# Patient Record
Sex: Female | Born: 1940 | Race: White | Hispanic: No | Marital: Married
Health system: Midwestern US, Academic
[De-identification: ages and names within clinical notes are randomized; demographics above are authoritative.]

## PROBLEM LIST (undated history)

## (undated) LAB — PREPARE RBC, LEUKOREDUCED
Blood Expiration Date: 201908302359
Blood Expiration Date: 201909052359
Blood Expiration Date: 202401152359
Blood Expiration Date: 202401182359
Dispense Status: TRANSFUSED
Dispense Status: TRANSFUSED
Dispense Status: TRANSFUSED
Dispense Status: TRANSFUSED

## (undated) LAB — PREPARE PLATELETS, LEUKOREDUCED
Blood Expiration Date: 202312212359
Dispense Status: TRANSFUSED

## (undated) MED FILL — RUCAPARIB 300 MG TABLET: 300 300 mg | ORAL | 30 days supply | Qty: 120 | Fill #1

## (undated) MED FILL — APIXABAN 5 MG TABLET: 5 5 mg | ORAL | 90 days supply | Qty: 180 | Fill #0

## (undated) MED FILL — SODIUM CHLORIDE 0.65 % NASAL SPRAY AEROSOL: 0.65 0.65 % | NASAL | 30 days supply | Qty: 44 | Fill #0

## (undated) MED FILL — APIXABAN 2.5 MG TABLET: 2.5 2.5 mg | ORAL | 30 days supply | Qty: 60 | Fill #0

## (undated) MED FILL — MAGNESIUM OXIDE 400 MG (241.3 MG MAGNESIUM) TABLET: 400 400 mg | ORAL | 2 days supply | Qty: 5 | Fill #0

## (undated) MED FILL — APIXABAN 2.5 MG TABLET: 2.5 2.5 mg | ORAL | 15 days supply | Qty: 30 | Fill #0

## (undated) MED FILL — ELIQUIS DVT-PE TREATMENT 30-DAY STARTER 5 MG (74 TABLETS) IN DOSE PACK: 5 5 mg (74 tabs) | ORAL | 30 days supply | Qty: 74 | Fill #0

## (undated) MED FILL — RUCAPARIB 300 MG TABLET: 300 300 mg | ORAL | 30 days supply | Qty: 60 | Fill #0

## (undated) MED FILL — RUCAPARIB 300 MG TABLET: 300 300 mg | ORAL | Qty: 120 | Fill #0

## (undated) MED FILL — APIXABAN 5 MG TABLET: 5 5 mg | ORAL | 7 days supply | Qty: 28 | Fill #0

---

## 2018-03-04 IMAGING — PT PET CT SCAN TUMOR IMAGING SKULL TO THIGH
1 of 3 series · 1 of 25 positions shown · non-contrast
Comparison: none

PET CT SCAN TUMOR IMAGING SKULL TO THIGH, 03/04/2018 [DATE]: 
CLINICAL INDICATION:  Recent biopsy of a retroperitoneal mass revealed 
none-small cell malignancy. Prior history of hysterectomy. History of ovarian 
malignancy diagnosed December 2012.
TECHNIQUE: A dose of 13.3 millicuries of 18-FDG was administered intravenously 
and skull to thigh PET scanning was performed at 65 minutes. Tomographic scans 
were reconstructed in axial, coronal, and sagittal projections. The data was 
reconstructed into a three-dimensional volume rendered images and reviewed in 
a 
rotational cine loop. Serum blood glucose at the time of injection was 117 
mg/dl. 
COMPARISON EXAMINATION:  CT abdomen pelvis and CT chest 01/29/2018 from the 
local hospital within the last 12 months.

[(id)_[email or web address]: (id) · axial · 4.0mm · 4.00mm/px · 1 of 213 slices shown]
[im 107/213]
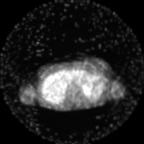

[1 of 25 positions shown; findings below may reference images not displayed]

FINDINGS: On the low-dose dose CT there is an approximately 5.5 cm left retroperitoneal 
mass showing elevated FDG avidity with Max SUV of up to 20. Part of the lesion 
is photopenic which may indicate central necrosis. There is also a small FDG 
avid lymph node just anterior to the abdominal aorta. There is an FDG avid 
lesion over the dome of the liver, most likely a pericapsular peritoneal 
metastasis showing max SUV of 6.5. No other pathological FDG activity is found 
in the abdomen or pelvis. Physiologic FDG avidity in the urinary tract is 
present there is activity in the GI tract which is felt to be physiologic. No 
abnormal areas of FDG avidity are found in the chest or neck. Otherwise 
unremarkable.
IMPRESSION: There is FDG avid retroperitoneal lymphadenopathy as described above and an 
FDG 
avid lesion over the dome of the liver most likely a peritoneal implant rather 
than a liver metastasis. No other focal areas of FDG avid malignancy are 
found. 
Specifically no lymph node activity is found outside of the retroperitoneum 
and 
there is no evidence for FDG avid pulmonary, skeletal or hepatic parenchymal 
metastasis.

## 2018-03-11 NOTE — Progress Notes (Signed)
Tried to review care everywhere but could not access records.

## 2018-03-19 NOTE — Telephone Encounter (Signed)
Left vm for the patient to do a pre clinic phone screen.

## 2018-03-22 NOTE — Telephone Encounter (Signed)
Patient returned a call from [facility], Friday 4/12.

## 2018-03-22 NOTE — Telephone Encounter (Signed)
Completed pre clinic phone screen

## 2018-04-01 ENCOUNTER — Ambulatory Visit: Admit: 2018-04-01 | Discharge: 2018-04-01 | Payer: Medicare (Managed Care)

## 2018-04-01 DIAGNOSIS — C561 Malignant neoplasm of right ovary: Secondary | ICD-10-CM

## 2018-04-01 MED ORDER — proCHLORPERazine (COMPAZINE) 10 MG tablet
10 | ORAL_TABLET | Freq: Four times a day (QID) | ORAL | 3 refills | Status: AC | PRN
Start: 2018-04-01 — End: 2020-08-16

## 2018-04-01 MED ORDER — dexamethasone (DECADRON) 4 MG tablet
4 | ORAL_TABLET | ORAL | 6 refills | 10.00000 days | Status: AC
Start: 2018-04-01 — End: 2018-11-18

## 2018-04-01 NOTE — Progress Notes (Signed)
History of Present Illness  Kelsey Sullivan is a 77 y.o. 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    Ms. 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 She declined adjuvant therapy. She used naturla methods, went to a raw diet for a year, etc.  She developed a small bowel obstruction in 03/2017, resolved on its own, with NED at that time.  In 01/2018, she experienced mid abdominal pain, and seen by PCP and ED, with CT of abdomen and pelvis showing interval development of necrotic retroperitoneal lymphadenopathy with largest node measuring 4.9  4.2 x 4.8 cm. Her CA 125 on 02/18/18 was 197.8.  She underwent a ct-guided nodal biopsy on 02/26/18, with pathology confirming recurrent disease.  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 She was seen at [facility] in [place] with Dr. Ishmael Holter and was recommended to undergo chemotherapy with Carboplatin and Paclitaxel-weekly.  Foundation 1 testing was also sent.  She lives in [place] half of the time, and [place] half of the time and has decided to pursue therapy in [place] given more family support.     Kelsey Sullivan is doing well.   She reports continued tenderness to her mid abdomen, intermittent.  Reports a 10 lb weight loss in 01/2018 but now eating  better.  She denies any bloating or fullness.  She reports normal bowel/bladder habits.  She denies any GU concerns. Denies any dysuria, frequency, hematuria, flank pain or fevers.  She denies any vaginal bleeding or discharge.   She denies neuropathy.   She reports no leg swelling.   Her appetite is good, and reports no nausea or vomiting.     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 Pt declined adjuvant therapy      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     Posterior cul de sac biopsy-metastatic adenocarcinoma.     Omentectomy- metastatic adenocarcinoma     LND-negative     Residual right IP ligament, Anterior cul de sac biopsy, right pelvic side will  biopsy, left pelvic side wall, right gutter and left gutter-->benign    04/06/2017:   CT [name]- mild small bowel obstruction with transition point in the left pelvis adjacent to the anterior abdominal wall. Low density in the common femoral veins bilaterally thought likely to represent flow artificat, may consider follow up with lomer extremity Doppler to ensure there is no underlying deep venous thrombosis.    01/29/2018:   CTAP:  Interval development of necrotic retroperitoneal lymphadenopathy with largest node measuring 4.9  4.2 x 4.8 cm. Origin is indeterminate.  Options would include CT directed biopsy as well as PET/CT.    02/18/2018:   CA  125:  197.8    02/26/2018:   CT guided Left retroperitoneal node biopsy- focally involved by non-small cell malignancy- additional stains are diagnostic of non-small cell carcionoma and supportive of involve63ment by the patients reported previous known ovarian malignancy.    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  No other focal areas of FD avid malignancy are found. Specifically, no lymph node activity is found outside of the retroperitoneum and there is no evidence for FDG avid pulmonary, skeletal or hepatic parenchymal metastasis.    02/2018:   Patient saw Dr. Ishmael Holter who recommended Carbo/Taxol with an AUC of 6.  Dr. Maren Reamer office submitted foundation one testing. Patient would like Genetic Testing    Disposition: chemo, imaging after 3 cucles, CA 125, possible parp maintenance after, genetic testing   Current disease status:   Genetics:  Survivorship plan: pending completion of treatment              Past Medical History  She  has a past medical history of Bowel obstruction (CMS Dx) (2018) and Ovarian cancer (CMS Dx) (2014).  Past Medical History:   Diagnosis Date    Bowel obstruction (CMS Dx) 2018    Ovarian cancer (CMS Dx) 2014       Past Surgical History  Past Surgical History:   Procedure Laterality Date    ABDOMINOPLASTY  1999    BLEPHAROPLASTY  2000    DILATION AND CURETTAGE OF UTERUS  1979    ELBOW SURGERY  1990    EYE SURGERY  1994    KNEE ARTHROPLASTY Right     ROTATOR CUFF REPAIR      TRANSUMBILICAL AUGMENTATION MAMMAPLASTY      VAGINAL HYSTERECTOMY  1979       Family History  History reviewed. No pertinent family history.    Family cancer history for ovarian, uterine and colon cancer is negative other than above.       Social History  Social History     Social History    Marital status: Divorced     Spouse name: N/A    Number of  children: N/A    Years of education: N/A     Social History Main Topics    Smoking status: Former Smoker    Smokeless tobacco: Never Used    Alcohol use Yes      Comment: 2-3 drinks a day    Drug use: No    Sexual activity: Not Asked     Other Topics Concern    Caffeine Use No    Occupational Exposure No    Exercise Yes    Seat Belt Yes     Social History Narrative  Mammogram-6 years ago and it was normal. Pt stated she isn't getting them anymore.      Colonoscopy-10 years ago        Pt reports she can lie flat in bed without SOB.    Pt states she can walk a flight of stairs and city block without chest pain and SOB>         Past OB/GYN History  G3P2  She reports menarche at age 42 and menopause at age 64-surgical.  She denies a history of STIs.  She denies a history of abnormal cervical cytology and reports her last cytologic examination she is unsure of. She has taken HRT.     Health maintenance:  Mammogram: Date 6 years ago Results normal  Colonoscopy: Date 10 years ago Results normal    Allergies  She is allergic to bactrim [sulfamethoxazole-trimethoprim] and nsaids (non-steroidal anti-inflammatory drug).    BMI  Body mass index is 19.57 kg/m.               Histories  She has a past medical history of Bowel obstruction (CMS Dx) (2018) and Ovarian cancer (CMS Dx) (2014).    She has a past surgical history that includes Dilation and curettage of uterus (1979); Vaginal hysterectomy (1979); Elbow surgery (1990); Eye surgery (1994); Abdominoplasty (1999); Blepharoplasty (2000); Transumbilical augmentation mammaplasty; Rotator cuff repair; and Knee Arthroplasty (Right).    Her family history is not on file.    She reports that she has quit smoking. She has never used smokeless tobacco. She reports that she drinks alcohol. She reports that she does not use drugs.    Allergies  Bactrim [sulfamethoxazole-trimethoprim] and Nsaids (non-steroidal anti-inflammatory drug)    Medications  Outpatient Encounter  Prescriptions as of 04/01/2018   Medication Sig Dispense Refill    ergocalciferol, vitamin D2, (VITAMIN D ORAL) Take by mouth.      omeprazole (PRILOSEC) 20 MG capsule       vit b complex w-b 12 (B COMPLEX-VITAMIN B12) tablet Take 1 tablet by mouth daily.      dexamethasone (DECADRON) 4 MG tablet Take 8 mg by mouth daily with food on day 2, 3, and 4 of chemotherapy cycle 18 tablet 6    proCHLORPERazine (COMPAZINE) 10 MG tablet Take 1 tablet (10 mg total) by mouth every 6 hours as needed (For nausea and vomiting). 30 tablet 3     No facility-administered encounter medications on file as of 04/01/2018.         Review of Systems   Constitutional: Positive for weight loss. Negative for activity change, appetite change, chills, fatigue, fever and weight gain.   HENT: Negative for congestion, mouth sores, rhinorrhea, sinus pressure, sore throat and trouble swallowing.    Eyes: Negative for photophobia, redness, itching and visual disturbance.   Respiratory: Negative for apnea, cough, chest tightness and shortness of breath.    Cardiovascular: Negative for chest pain, palpitations and leg swelling.   Gastrointestinal: Positive for abdominal pain. Negative for abdominal distention, anal bleeding, bloating, blood in stool, constipation, diarrhea, nausea and vomiting.   Genitourinary: Negative for decreased urine volume, difficulty urinating, dysuria, frequency, genital sores, hematuria, pelvic pain, urgency, vaginal bleeding, vaginal discharge and vaginal pain.   Musculoskeletal: Negative for arthralgias, back pain, joint swelling and neck pain.   Skin: Negative for color change, pallor and wound.   Neurological: Negative for dizziness, seizures, weakness, numbness and headaches.   Hematological: Negative for adenopathy. Does not bruise/bleed easily.   Psychiatric/Behavioral: Negative for  agitation, confusion and depression. The patient is not nervous/anxious.        Vitals  Blood pressure 153/75, pulse 77, temperature  97.4 F (36.3 C), temperature source Temporal, resp. rate 18, height 5' 4 (1.626 m), weight 114 lb (51.7 kg), SpO2 100 %.    Physical Exam   Constitutional: She is oriented to person, place, and time. She appears well-developed and well-nourished.   HENT:   Head: Normocephalic and atraumatic.   Eyes: Conjunctivae and EOM are normal.   Neck: Normal range of motion. Neck supple. No thyromegaly present.   Cardiovascular: Normal rate, regular rhythm and normal heart sounds.    Pulmonary/Chest: Effort normal and breath sounds normal.   Abdominal: Soft. Bowel sounds are normal. She exhibits no distension and no mass. There is tenderness. There is no rebound and no guarding.   No masses or tenderness.  No hernia palpable.   No hepatosplenomegaly.     Prior scars from xlap, tummy tuck   Genitourinary:   Genitourinary Comments: Normal external genitalia/vulva.  Normal urethral meatus, urethra, bladder, anus and perineum.  Normal vagina and vaginal cuff.  Uterus, cervix and adnexa surgically absent.  Normal rectum.  There is no nodularity in the posterior culdesac or rectovaginal septum.   Musculoskeletal: Normal range of motion. She exhibits no edema.   Lymphadenopathy:     She has no cervical adenopathy.   Neurological: She is alert and oriented to person, place, and time.   Skin: Skin is warm and dry.   Psychiatric: She has a normal mood and affect. Her behavior is normal. Judgment and thought content normal.          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       L ovary and fallopian tube- serous adenocarcinoma,  high grade, 4 cm, favor metastasis.     Posterior cul de sac biopsy-metastatic adenocarcinoma.     Omentectomy- metastatic adenocarcinoma     LND-negative     Residual right IP ligament, Anterior cul de sac biopsy, right pelvic side will biopsy, left pelvic side wall, right gutter and left gutter-->benign    04/06/2017:   CT [name]- mild small bowel obstruction with transition point in the left pelvis adjacent to the anterior abdominal wall. Low density in the common femoral veins bilaterally thought likely to represent flow artificat, may consider follow up with lomer extremity Doppler to ensure there is no underlying deep venous thrombosis.    01/29/2018:   CTAP:  Interval development of necrotic retroperitoneal lymphadenopathy with largest node measuring 4.9  4.2 x 4.8 cm. Origin is indeterminate.  Options would include CT directed biopsy as well as PET/CT.    02/18/2018:   CA 125:  197.8    02/26/2018:   CT guided Left retroperitoneal node biopsy- focally involved by non-small cell malignancy- additional stains are diagnostic of non-small cell carcionoma and supportive of involve67ment by the patients reported previous known ovarian malignancy.    03/04/2018:   PET/CT-  FDG avid retroperitoneal lymphadenopathy as described above and an FDG avid lesion  over the dome of the liver most likely a peritoneal implant rather than a liver metastasis.  No other focal areas of FD avid malignancy are found. Specifically, no lymph node activity is found outside of the retroperitoneum and there is no evidence for FDG avid pulmonary, skeletal or hepatic parenchymal metastasis.    Cancer Staging:  Cancer Staging  No matching staging information was found for the patient.                Assessment & Plan  My impression is Ms. 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    We discussed recurrent ovarian cancer in detail and agree with the recommendation of Dr. Ishmael Holter for treatment platinum-based chemotherapy with Carboplatin and Paclitaxel.  The patient is interested in dose-dense Paclitaxel and we will proceed in this manner.      The risk and potential benefits of chemotherapy as well as available alternatives have been reviewed.  The patient has verbalized clear understanding of the cancer diagnosis and the reasons for the recommended therapy.  We discussed the risks and side effects of treatment, including allergic reaction, anemia, thrombocytopenia, neutropenia, neutropenic sepsis, kidney failure, peripheral neuropathy, and even death related to chemotherapy complications.  The patient's recent clinical and laboratory data have been reviewed and the treatment plan formulated.  Patient verbalized understanding of the plan and associated risks.    We will plan to begin treatment in the next couple weeks, and will plan for port placement prior. We will also plan to send genetic testing at her next visit.  All of her questions were answered and we will see her back in 2 weeks to begin treatment.  __IR referral for port placement  __Genetic testing to be sent at next visit  __C1 planned for 5/9  __OSH CTs from 01/29/18 and pet from 03/04/18 to be uploaded- give CDs back to pt   __needs chemo consent next visit    Pain  Pain Score: Zero (04/01/2018 10:53 AM)     She denies presence of pain.  Pain will be reassessed at next visit.           Hedda Slade, CNP  Division of Gynecologic Oncology  [phone]    I saw and personally examined the patient today with my CNP [place]. I discussed the findings and therapeutic plan with the patient. I repeated, reviewed and agree with the history of present illness, past medical histories, family history, social history, medication list, and allergies as listed. The review of  systems is as noted above. My physical exam confirms the findings listed above. Review of labs, pathology reports, radiograph reports, and medical records confirm the findings noted above. I agree with the assessment and plan as noted above. I have edited the note where appropriate.     I have personally performed a face to face diagnostic evaluation on this patient, seen initially by the CNP. I have personally developed the care plan as outlined in the Assessment and Plan.      Complexity: high     I spent a total of 45 minutes face-to-face of which >50% was spent in counseling and/or coordination of care, documentation and counseling.with patient and/or family.      Topics discussed include recurrent plat sensitive  Cancer prognosis and treatment options and chemo counseling.      Maggie Font, MD  Gynecology Oncology  [phone]      Medical Decision Making:  The following items were considered in medical decision making:  Obtain records and history from outside facility/provider  Review / order clinical lab tests  Review / order radiology tests  Review / order other diagnostic tests/interventions  Reviewed outside records

## 2018-04-02 MED ORDER — lidocaine-prilocaine (EMLA) cream
2.5-2.5 | Freq: Every day | TOPICAL | 1 refills | Status: AC | PRN
Start: 2018-04-02 — End: 2020-08-16

## 2018-04-02 NOTE — Telephone Encounter (Signed)
Patient called about a topical anesthetic gel that is used for ports.

## 2018-04-02 NOTE — Telephone Encounter (Signed)
Rx sent to pharmacy for Emla

## 2018-04-06 ENCOUNTER — Inpatient Hospital Stay: Admit: 2018-04-06

## 2018-04-09 NOTE — Unmapped (Signed)
IR PRE-PROCEDURE PHONE CALL NOTE   Date: 04/09/2018  MRN: [id number]  Patient: Kelsey Sullivan  Age: 77 y.o.    Spoke to [place] regarding preparation for upcoming IR port placement procedure at 0930 am on 04/12/2018 with an arrival time of 0900 am. Prep Instructions to hold none as approved by n/a, NPO after midnight, and need for a driver reviewed. Carroll Kinds Eguia verbalized understanding and teach back of instructions.    Interperter needed: no    Wt Readings from Last 1 Encounters:   04/01/18 114 lb (51.7 kg)      Ht Readings from Last 1 Encounters:   04/01/18 5' 4 (1.626 m)     Allergies Verified? yes  Allergies   Allergen Reactions   ??? Bactrim [Sulfamethoxazole-Trimethoprim]      Fear of going crazy   ??? Nsaids (Non-Steroidal Anti-Inflammatory Drug)      Stomach ache     History:   Primary Care Physician: Salina April, MD    Cardiovascular Disease: NO  Pulmonary Disease: NO  Renal Disease: NO  Liver Disease: NO  Other Disease: Bowel obstruction, Ovarian cancer   Heartburn: NO  Fainting: NO  Stroke: NO  Seizure: NO  Diabetes: NO  Bleeding Problems: NO  Blood Clots: NO  Smoker: former,quit smoking. She has never used smokeless tobacco  >3 Drinks per day: 2-3 drinks a day   Pregnant: no  Can you lay flat: yes  Have you or a family member ever had problems with anesthesia/sedation: no    Past Medical History:   Diagnosis Date   ??? Bowel obstruction (CMS Dx) 2018   ??? Ovarian cancer (CMS Dx) 2014     Past Surgical History:   Procedure Laterality Date   ??? ABDOMINOPLASTY  1999   ??? BLEPHAROPLASTY  2000   ??? DILATION AND CURETTAGE OF UTERUS  1979   ??? ELBOW SURGERY  1990   ??? EYE SURGERY  1994   ??? KNEE ARTHROPLASTY Right    ??? ROTATOR CUFF REPAIR     ??? TRANSUMBILICAL AUGMENTATION MAMMAPLASTY     ??? VAGINAL HYSTERECTOMY  1979       Medications:  Current Outpatient Prescriptions on File Prior to Visit   Medication Sig Dispense Refill   ??? dexamethasone (DECADRON) 4 MG tablet Take 8 mg by mouth daily with food on day 2, 3, and  4 of chemotherapy cycle 18 tablet 6   ??? ergocalciferol, vitamin D2, (VITAMIN D ORAL) Take by mouth.     ??? lidocaine-prilocaine (EMLA) cream Apply topically daily as needed. Apply pea-size amount to port site 30-60 min prior to accessing port 30 g 1   ??? omeprazole (PRILOSEC) 20 MG capsule      ??? proCHLORPERazine (COMPAZINE) 10 MG tablet Take 1 tablet (10 mg total) by mouth every 6 hours as needed (For nausea and vomiting). 30 tablet 3   ??? vit b complex w-b 12 (B COMPLEX-VITAMIN B12) tablet Take 1 tablet by mouth daily.       No current facility-administered medications on file prior to visit.      Anticoagulation/Anti-platelet: NONE  Planned Sedation: local, moderate sedation  Hydration needed: N/A  Possible Pre-meds: N/A  Possible labs needed: N/A  Other Imaging needed: N/A  Transportation needed: no  Who will give you a ride and take care of you after discharge? friend    Cecille Po  Interventional Radiology [phone]  04/09/2018, 11:35 AM

## 2018-04-12 ENCOUNTER — Inpatient Hospital Stay: Admit: 2018-04-12 | Discharge: 2018-04-16 | Payer: Medicare (Managed Care) | Attending: Diagnostic Radiology

## 2018-04-12 DIAGNOSIS — C5700 Malignant neoplasm of unspecified fallopian tube: Secondary | ICD-10-CM

## 2018-04-12 LAB — PROTIME-INR
INR: 1 (ref 0.9–1.1)
Protime: 13.4 seconds (ref 12.1–15.1)

## 2018-04-12 MED ORDER — fentaNYL (SUBLIMAZE) injection
50 | INTRAMUSCULAR | Status: AC | PRN
Start: 2018-04-12 — End: 2018-04-12
  Administered 2018-04-12 (×2): 50 via INTRAVENOUS

## 2018-04-12 MED ORDER — ceFAZolin (ANCEF) injection
1 | INTRAMUSCULAR | Status: AC
Start: 2018-04-12 — End: 2018-04-12
  Administered 2018-04-12: 14:00:00 1

## 2018-04-12 MED ORDER — midazolam (PF) (VERSED) 5 mg/mL injection
5 | INTRAMUSCULAR | Status: AC
Start: 2018-04-12 — End: 2018-04-12

## 2018-04-12 MED ORDER — fentaNYL (SUBLIMAZE) 50 mcg/mL injection
50 | INTRAMUSCULAR | Status: AC
Start: 2018-04-12 — End: 2018-04-12

## 2018-04-12 MED ORDER — naloxone (NARCAN) 0.4 mg/mL injection
0.4 | INTRAMUSCULAR | Status: AC
Start: 2018-04-12 — End: 2018-04-12

## 2018-04-12 MED ORDER — flumazenil (ROMAZICON) 0.1 mg/mL injection
0.1 | INTRAVENOUS | Status: AC
Start: 2018-04-12 — End: 2018-04-12

## 2018-04-12 MED ORDER — sodium chloride 0.9 % infusion
INTRAVENOUS | Status: AC | PRN
Start: 2018-04-12 — End: 2018-04-12
  Administered 2018-04-12: 14:00:00 250 via INTRAVENOUS

## 2018-04-12 MED ORDER — ceFAZolin (ANCEF) injection
1 | INTRAMUSCULAR | Status: AC
Start: 2018-04-12 — End: 2018-04-12
  Administered 2018-04-12: 14:00:00 1 via INTRAVENOUS

## 2018-04-12 MED ORDER — ceFAZolin (ANCEF) 1 g in sodium chloride 0.9% 100 mL ADDaptor IVPB
Freq: Three times a day (TID) | INTRAVENOUS | Status: AC
Start: 2018-04-12 — End: 2018-04-13

## 2018-04-12 MED ORDER — midazolam (PF) (VERSED) injection
5 | INTRAMUSCULAR | Status: AC | PRN
Start: 2018-04-12 — End: 2018-04-12
  Administered 2018-04-12 (×2): 1 via INTRAVENOUS

## 2018-04-12 MED ORDER — lidocaine-EPINEPHrine 1 %-1:100,000 injection
1 | INTRAMUSCULAR | Status: AC | PRN
Start: 2018-04-12 — End: 2018-04-12
  Administered 2018-04-12: 14:00:00 5 via EPIDURAL

## 2018-04-12 MED FILL — FLUMAZENIL 0.1 MG/ML INTRAVENOUS SOLUTION: 0.1 0.1 mg/mL | INTRAVENOUS | Qty: 5

## 2018-04-12 MED FILL — NALOXONE 0.4 MG/ML INJECTION SOLUTION: 0.4 0.4 mg/mL | INTRAMUSCULAR | Qty: 1

## 2018-04-12 MED FILL — CEFAZOLIN 1 GRAM SOLUTION FOR INJECTION: 1 1 g | INTRAMUSCULAR | Qty: 1000

## 2018-04-12 MED FILL — MIDAZOLAM (PF) 5 MG/ML INJECTION SOLUTION: 5 5 mg/mL | INTRAMUSCULAR | Qty: 1

## 2018-04-12 MED FILL — FENTANYL (PF) 50 MCG/ML INJECTION SOLUTION: 50 50 mcg/mL | INTRAMUSCULAR | Qty: 2

## 2018-04-12 NOTE — H&P (Addendum)
Interventional Radiology Pre-Procedure History and Physical       Date: 04/12/2018  Patient: Kelsey Sullivan  DOB: 11-19-1941  MRN: [id number]    Primary Care Provider: Salina April, MD  Requesting Provider: Oncology  Reason for Consult: Port placement    HPI: Kelsey Sullivan is a 77 y.o. 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 No prior history of port catheter placement.     History:  Past Medical History:   Diagnosis Date    Bowel obstruction (CMS Dx) 2018    Ovarian cancer (CMS Dx) 2014     Past Surgical History:   Procedure Laterality Date    ABDOMINOPLASTY  1999    BLEPHAROPLASTY  2000    DILATION AND CURETTAGE OF UTERUS  1979    ELBOW SURGERY  1990    EYE SURGERY  1994    KNEE ARTHROPLASTY Right     ROTATOR CUFF REPAIR      TRANSUMBILICAL AUGMENTATION MAMMAPLASTY      VAGINAL HYSTERECTOMY  1979     No family history on file.    Social Hx:  History   Smoking Status    Former Smoker   Smokeless Tobacco    Never Used     History   Drug Use No     History   Alcohol Use    Yes     Comment: 2-3 drinks a day       Allergies:   Allergies   Allergen Reactions    Bactrim [Sulfamethoxazole-Trimethoprim]      Fear of going crazy    Nsaids (Non-Steroidal Anti-Inflammatory Drug)      Stomach ache       Home Medications:  Prior to Admission medications    Medication   dexamethasone (DECADRON) 4 MG tablet   ergocalciferol, vitamin D2, (VITAMIN D ORAL)   lidocaine-prilocaine (EMLA) cream   omeprazole (PRILOSEC) 20 MG capsule   proCHLORPERazine (COMPAZINE) 10 MG tablet   vit b complex w-b 12 (B COMPLEX-VITAMIN B12) tablet       ROS:   Negative General: Denies fever, chills or weakness  Cardiovascular: Denies chest pain, chest pressure or palpitations  Respiratory: Denies shortness of breath, wheezing or cough  GI: Denies abdominal pain or n/v/d  GU: Denies pain with urination or blood in urine  Skin: Denies rashes or  bruising  Neurological: Denies headaches, dizziness, gait problems, or seizures  Extremities: Denies swelling or numbness/tingling  Psychiatric: Denies hallucinations and memory loss    Vitals:   Vitals:    04/12/18 0931   BP: 132/65   BP Location: Right arm   Patient Position: Lying   Pulse: 70   Resp: 16   Temp: 98 F (36.7 C)   TempSrc: Oral   SpO2: 100%       PE:  Gen: Awake, alert, no apparent distress, as stated age  HEENT: Normocephalic, atraumatic, EOMI, mucous membranes pink and moist  Neck: Supple, symmetrical, trachea midline and mobile  Chest: Respirations unlabored  CVS: RRR  ABD: Soft, non-tender  GU: deferred  EXT: Warm and well perfused, radial pulses 2+  NEURO: No focal deficits, cranial nerves II-XII grossly intact  PSYCH: Appropriate affect     Labs:  No results for input(s): WBC, HGB, HCT, MCV, PLT in the last 4320 hours.  No results for input(s): INR, PROTIME in the last 4320 hours.  No results for input(s): CREATININE in the  last 4320 hours.  No results for input(s): ALT, AST, GGT, ALKPHOS, BILITOT in the last 4320 hours.    Imaging:  No results found for this or any previous visit (from the past 720 hours).    I personally reviewed the relevant findings from the above imaging results.    Pre-Sedation Evaluation:      Airway:     Mallampati::  III    Mouth Opening::  >2 FB    TM distance::  > = 3 FB    Neck ROM::  Full    ASA Score:     ASA:  II -patient with mild systemic disease with no functional limitations    Sedation specific history concerns:   none of the above        Medical Decision Making       Assessment:  Kelsey Sullivan is a 77 y.o. 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    Plan:  1. Will proceed with chest port placement.   2.Local/Subcutaneous Lidocaine 1% and Moderate Sedation/Intravenous Fentanyl & Versed    The procedure has been explained. Post-procedure care instructions &  education have been provided. All questions were answered.     Consent with alternatives to the procedure, risks, and benefits have been explained and discussed with the patient/patient's family and signed informed consent obtained. Patient/patient's family agreeable with plan of care.     Consent obtained and placed in chart    Delos Haring, MD  Interventional Radiology  04/12/2018, 9:38 AM  [facility] IR Desk: [phone]  [facility] IR: [phone]  IR On-Call (after-hours, nights, wkds, holidays): [phone]

## 2018-04-12 NOTE — Unmapped (Signed)
Interventional Radiology Post-Procedure Note       Date: 04/12/2018  Patient: Kelsey Sullivan  DOB: 1941-01-30  MRN: [id number]    IR Procedure(s) Performed:   Right chest port placement    Operators:  Delos Haring, MD, radiology resident   Epimenio Sarin, MD    Pre-operative diagnosis:   High grade serous fallopian tube cancer     Post-operative diagnosis:  High grade serous fallopian tube cancer     Intra-procedural Medications:  Medications   Medication Event Details Admin User Admin Time   fentaNYL (SUBLIMAZE) injection Medication Canceled Entry Route: Intravenous; Comment: Automatically documented as Canceled Entry when linked to one-step medication.; Linked override order: fentaNYL (SUBLIMAZE) 50 mcg/mL injection Brayton Caves [place], RN 04/12/2018  9:30 AM   midazolam (PF) (VERSED) injection Medication Canceled Entry Route: Intravenous; Comment: Automatically documented as Canceled Entry when linked to one-step medication.; Linked override order: midazolam (PF) (VERSED) 5 mg/mL injection Brayton Caves [place], RN 04/12/2018  9:30 AM   sodium chloride 0.9 % infusion Medication Ordered and New Bag Dose: 250 mL; Route: Intravenous; Ordered by: Rodena Piety, PA Brayton Caves [place], RN 04/12/2018  9:35 AM   ceFAZolin (ANCEF) injection Medication Given Dose: 1 g; Route: Intravenous; Scheduled Time:  9:30 AM Valente David, RN 04/12/2018  9:36 AM   fentaNYL (SUBLIMAZE) injection Medication Ordered and Given Dose: 50 mcg; Route: Intravenous; Ordered by: Epimenio Sarin, MD Brayton Caves [place], RN 04/12/2018 10:08 AM   midazolam (PF) (VERSED) injection Medication Ordered and Given Dose: 1 mg; Route: Intravenous; Ordered by: Epimenio Sarin, MD Brayton Caves [place], RN 04/12/2018 10:08 AM   fentaNYL (SUBLIMAZE) injection Medication Ordered and Given Dose: 50 mcg; Route: Intravenous; Ordered by: Epimenio Sarin, MD Brayton Caves [place], RN 04/12/2018 10:15 AM   midazolam (PF) (VERSED) injection Medication Ordered and  Given Dose: 1 mg; Route: Intravenous; Ordered by: Epimenio Sarin, MD Brayton Caves [place], RN 04/12/2018 10:15 AM   ceFAZolin (ANCEF) injection Medication Given Dose: 1 g; Scheduled Time: 10:30 AM; Comment: placed on OR table for wash per Dr Tamera Punt [facility], RN 04/12/2018 10:24 AM   lidocaine-EPINEPHrine 1 %-1:100,000 injection Medication Ordered and Given Dose: 5 mL; Route: Epidural; Comment: SQ right chest NAD NECK; Ordered by: Epimenio Sarin, MD Epimenio Sarin, MD 04/12/2018 10:24 AM       Findings:  Successful placement of a 96F Bard 17in PowerPort ClearVUE Slim Implantable Port on the right. Port is ready for immediate use.      Specimens removed:  None    Estimated Blood Loss:  Minimal (less than 15mL)    Complications:  None    Recommendations/Follow-up:  Chest port ready for immediate use.     Please refer to full dictated Interventional Radiology report for details of findings/procedures, which can be located in [facility] Procedures (or [facility]) Tabs.    IR procedure was reviewed with Epimenio Sarin, MD    Please call with any questions.    Delos Haring, MD  Interventional Radiology  04/12/2018, 10:50 AM  [facility] IR Desk: [phone]  [facility] IR: [phone]  IR On-Call (after-hours, nights, wkds, holidays): [phone]

## 2018-04-12 NOTE — Unmapped (Signed)
We value your decision to have Interventional Radiology as part of your care team. We want to provide you with the best care possible. Thank you for giving Interventional Radiology the opportunity to care for you and your family at this time.  If you have any questions or concerns, please call:     Monday through Friday 8:00 am - 5:00 pm: [phone]   Evenings, Weekends, & Holidays:   [phone] (if no answer call [phone])   Anytime (Main Hospital): [phone]       Procedural Sedation, Care After  Refer to this sheet in the next 24 hours. These instructions provide you with information on caring for yourself after your procedure. Your caregiver may also give you more specific instructions. Your treatment has been planned according to current medical practices, but problems sometimes occur. Call your caregiver if you have any problems or questions after your procedure.    WHAT IS PROCEDURAL SEDATION:  This type of sedation induces an altered level of consciousness that minimizes pain and discomfort through the use of pain relievers and sedatives. Patients who receive procedural sedation usually are able to speak and respond to verbal cues throughout the procedure. A brief period of amnesia may erase any memory of the procedure.    HOME CARE INSTRUCTIONS:  ??? Do not participate in any activities that require you to be alert or coordinated.  ??? Do not:  o Drive  o Swim  o Ride a bicycle  o Operate heavy machinery  o Cook  o Use power tools  o Climb ladders  o Work at heights  o Take a bath  ??? Do not drink alcohol  ??? Do not make any important decisions or sign legal documents.  ??? Stay with an adult  ??? The first meal following your procedure should be light and small. Avoid solid foods if you feel sick to your stomach (nauseous) or if you throw up (vomit).  ??? Drink enough fluids to keep your urine clear or pale yellow.  ??? Only take your usual medicines or new medicines if your caregiver approves them.  ??? Only  take over-the-counter or prescription medicines for pain, discomfort, or fever as directed by your caregiver.  ??? Keep all follow-up appointments as directed by your caregiver.    SEEK IMMEDIATE MEDICAL CARE IF:  ??? You are not feeling normal or behaving normally after 24 hours.  ??? You have persistent nausea and vomiting.  ??? You are unable to drink fluids or eat food.  ??? You have difficulty urinating.  ??? You have difficulty waking or cannot be woken up.  ??? You develop a rash.   You have difficulty breathing or develop chest pain.We value your decision to have Interventional Radiology as part of your care team. We want to provide you with the best care possible. Thank you for giving Interventional Radiology the opportunity to care for you and your family at this time.  If you have any questions or concerns, please call:     Monday through Friday 8:00 am - 5:00 pm: [phone]   Evenings, Weekends, & Holidays:   [phone] (if no answer call [phone])   Anytime (Main Hospital): [phone]       Portacath Placement    Post Procedure Instructions:   ?? Do Not drive, exercise or do any strenuous activities today.  ?? Do Not make any legal decisions today.  ?? The area may be sore for 5-7 days.  You may take Tylenol or   other prescription pain medication for tenderness or pain at insertion site.  ?? Keep a dressing over the port site for the first 3 days. After that keep it open to air and dry.  ?? We recommend that you apply a waterproof dressing over the site.  Make sure the area and dressing are completely dry after your shower. Do not let the shower jet hit directly on port site.  ?? Once the port has healed and is not being used regularly it will need to be flushed once a month so it does not clot off.  This should be arranged with the clinic that used your port for treatments or blood draws.     Seek Medical Advice If:  ?? You have bleeding, swelling, redness, or drainage at the port site.  ?? You have a fever greater  than 101 degrees Fahrenheit or chills.  ?? You have questions about the care of the port.

## 2018-04-15 ENCOUNTER — Ambulatory Visit: Admit: 2018-04-15 | Payer: Medicare (Managed Care)

## 2018-04-15 ENCOUNTER — Ambulatory Visit: Admit: 2018-04-15 | Discharge: 2018-04-15 | Payer: Medicare (Managed Care)

## 2018-04-15 ENCOUNTER — Other Ambulatory Visit: Admit: 2018-04-15 | Payer: Medicare (Managed Care)

## 2018-04-15 DIAGNOSIS — C563 Malignant neoplasm of bilateral ovaries (CMS-HCC): Secondary | ICD-10-CM

## 2018-04-15 DIAGNOSIS — C561 Malignant neoplasm of right ovary: Secondary | ICD-10-CM

## 2018-04-15 LAB — COMPREHENSIVE METABOLIC PANEL
ALT: 9 U/L (ref 7–52)
AST: 17 U/L (ref 13–39)
Albumin: 4.4 g/dL (ref 3.5–5.7)
Alkaline Phosphatase: 67 U/L (ref 36–125)
Anion Gap: 7 mmol/L (ref 3–16)
BUN: 13 mg/dL (ref 7–25)
CO2: 26 mmol/L (ref 21–33)
Calcium: 9.4 mg/dL (ref 8.6–10.3)
Chloride: 100 mmol/L (ref 98–110)
Creatinine: 0.62 mg/dL (ref 0.60–1.30)
Glucose: 94 mg/dL (ref 70–100)
Osmolality, Calculated: 276 mOsm/kg (ref 278–305)
Potassium: 4.3 mmol/L (ref 3.5–5.3)
Sodium: 133 mmol/L (ref 133–146)
Total Bilirubin: 0.5 mg/dL (ref 0.0–1.5)
Total Protein: 6.9 g/dL (ref 6.4–8.9)
eGFR AA CKD-EPI: >90 See note.
eGFR NONAA CKD-EPI: 88 See note.

## 2018-04-15 LAB — CBC
Hematocrit: 33.5 % (ref 35.0–45.0)
Hemoglobin: 11.6 g/dL (ref 11.7–15.5)
MCH: 33.5 pg (ref 27.0–33.0)
MCHC: 34.7 g/dL (ref 32.0–36.0)
MCV: 96.7 fL (ref 80.0–100.0)
MPV: 7.6 fL (ref 7.5–11.5)
Platelets: 257 10*3/uL (ref 140–400)
RBC: 3.46 10*6/uL (ref 3.80–5.10)
RDW: 13.5 % (ref 11.0–15.0)
WBC: 4.9 10*3/uL (ref 3.8–10.8)

## 2018-04-15 LAB — DIFFERENTIAL
Basophils Absolute: 34 /uL (ref 0–200)
Basophils Relative: 0.7 % (ref 0.0–1.0)
Eosinophils Absolute: 59 /uL (ref 15–500)
Eosinophils Relative: 1.2 % (ref 0.0–8.0)
Lymphocytes Absolute: 990 /uL (ref 850–3900)
Lymphocytes Relative: 20.2 % (ref 15.0–45.0)
Monocytes Absolute: 421 /uL (ref 200–950)
Monocytes Relative: 8.6 % (ref 0.0–12.0)
Neutrophils Absolute: 3396 /uL (ref 1500–7800)
Neutrophils Relative: 69.3 % (ref 40.0–80.0)
nRBC: 0 /100 WBC (ref 0–0)

## 2018-04-15 LAB — CA 125: CA 125: 523.4 U/mL (ref 5.5–35.0)

## 2018-04-15 LAB — MAGNESIUM: Magnesium: 1.8 mg/dL (ref 1.5–2.5)

## 2018-04-15 MED ORDER — heparin lock flush Syrg 500 Units
100 | Freq: Every day | INTRAVENOUS | Status: AC | PRN
Start: 2018-04-15 — End: 2018-04-16
  Administered 2018-04-15: 19:00:00 500 [IU] via INTRAVENOUS

## 2018-04-15 MED ORDER — sodium chloride flush 20 mL
Freq: Every day | INTRAMUSCULAR | Status: AC | PRN
Start: 2018-04-15 — End: 2018-04-16
  Administered 2018-04-15: 19:00:00 20 mL via INTRAVENOUS

## 2018-04-15 NOTE — Nursing Note (Signed)
1455 Port accessed per protocol. Positive blood return noted.  Port flushed with 20ml of normal saline, followed with 5ml of 100u/ml of heparinized saline. Huber needle removed. Band aid applied. Labs drawn via port.

## 2018-04-15 NOTE — Unmapped (Signed)
History of Present Illness  Kelsey Sullivan is a 77 y.o. female with   Chief Complaint   Patient presents with   ??? 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    Ms. 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 She declined adjuvant therapy. She used natural methods, went to a raw diet for a year, etc.  She developed a small bowel obstruction in 03/2017, resolved on its own, with NED at that time.  In 01/2018, she experienced mid abdominal pain, and seen by PCP and ED, with CT of abdomen and pelvis showing interval development of necrotic retroperitoneal lymphadenopathy with largest node measuring 4.9  4.2 x 4.8 cm. Her CA 125 on 02/18/18 was 197.8.  She underwent a ct-guided nodal biopsy on 02/26/18, with pathology confirming recurrent disease.  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 She was seen at [facility] in [place] with Dr. Ishmael Holter and was recommended to undergo chemotherapy with Carboplatin and Paclitaxel-weekly.  The patient is interested in dose-dense Paclitaxel and we will proceed in this manner. Foundation 1 testing was also sent.     She lives in [place] half of the time, and [place] half of the time and has decided to pursue therapy in [place] given more family support.     Kelsey Sullivan is doing well. Port placement procedure went well. She states  overnight while she was asleep she picked at her port. After she realized what had occurred, she cleaned it with betadine, applied benzocaine and steri-strips. She has been increasing her wheat grass shakes to twice a day and was curious if it affected her CA-125.    She reports less tenderness in her abdomen, but continues to have tenderness to palpation on her LUQ abdomen.    She denies any bloating or fullness.  She reports normal bowel/bladder habits.  She denies any GU concerns. Denies any dysuria, frequency, hematuria, flank pain or fevers.  She denies any vaginal bleeding or discharge.   She denies neuropathy.   She reports no leg swelling.   Her appetite is good, and reports no nausea or vomiting.     Patient Active Problem List    Diagnosis   ??? 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 Pt declined adjuvant therapy      R ovary and fallopain tube-  Serous adenocarcinoma, high grade, arising from the  fallopian tube, infarcted consistent with torsion. Serous carcionma in situ.       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     Posterior cul de sac biopsy-metastatic adenocarcinoma.     Omentectomy- metastatic adenocarcinoma     LND-negative     Residual right IP ligament, Anterior cul de sac biopsy, right pelvic side will biopsy, left pelvic side wall, right gutter and left gutter-->benign    04/06/2017:   CT [name]- mild small bowel obstruction with transition point in the left pelvis adjacent to the anterior abdominal wall. Low density in the common femoral veins bilaterally thought likely to  represent flow artificat, may consider follow up with lomer extremity Doppler to ensure there is no underlying deep venous thrombosis.    01/29/2018:   CTAP:  Interval development of necrotic retroperitoneal lymphadenopathy with largest node measuring 4.9  4.2 x 4.8 cm. Origin is indeterminate.  Options would include CT directed biopsy as well as PET/CT.    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  No other focal areas of FD avid malignancy are found. Specifically, no lymph node activity is found outside of the retroperitoneum and there is no evidence for FDG avid pulmonary, skeletal or hepatic parenchymal metastasis.    02/2018:   Patient saw Dr. Ishmael Holter who recommended Carbo/Taxol with an AUC of 6.  Dr. Maren Reamer office submitted foundation one testing. Patient would like Genetic Testing    04/12/2018:   IR port placed    04/16/2018:   C1 weekly taxol 80 mg/m2 D1,8,15, Carboplatin AUC 6 D1 q 21 days. PCEs on thurs, txt on fridays      CA 125:     Genetic testing     Disposition: chemo, imaging after 3 cucles, CA 125, possible parp maintenance after, genetic testing next visit, foundation one testing results from [facility] in [place]  Current disease status:   Genetics:  Survivorship plan: pending completion of treatment              Past Medical History  She  has a past medical history of Bowel obstruction (CMS Dx) (2018) and Ovarian cancer (CMS Dx) (2014).  Past Medical History:   Diagnosis Date   ??? Bowel obstruction (CMS Dx) 2018   ??? Ovarian cancer (CMS Dx) 2014       Past Surgical  History  Past Surgical History:   Procedure Laterality Date   ??? ABDOMINOPLASTY  1999   ??? BLEPHAROPLASTY  2000   ??? DILATION AND CURETTAGE OF UTERUS  1979   ??? ELBOW SURGERY  1990   ??? EYE SURGERY  1994   ??? KNEE ARTHROPLASTY Right    ??? ROTATOR CUFF REPAIR     ??? TRANSUMBILICAL AUGMENTATION MAMMAPLASTY     ??? VAGINAL HYSTERECTOMY  1979       Family History  History reviewed. No pertinent family history.    Family cancer history for ovarian, uterine and colon cancer is negative other than above.       Social History  Social  History     Social History   ??? Marital status: Divorced     Spouse name: N/A   ??? Number of children: N/A   ??? Years of education: N/A     Social History Main Topics   ??? Smoking status: Former Smoker   ??? Smokeless tobacco: Never Used   ??? Alcohol use Yes      Comment: 2-3 drinks a day   ??? Drug use: No   ??? Sexual activity: Not Asked     Other Topics Concern   ??? Caffeine Use No   ??? Occupational Exposure No   ??? Exercise Yes   ??? Seat Belt Yes     Social History Narrative    Mammogram-6 years ago and it was normal. Pt stated she isn't getting them anymore.      Colonoscopy-10 years ago        Pt reports she can lie flat in bed without SOB.    Pt states she can walk a flight of stairs and city block without chest pain and SOB>         Past OB/GYN History  G3P2  She reports menarche at age 13 and menopause at age 40-surgical.  She denies a history of STIs.  She denies a history of abnormal cervical cytology and reports her last cytologic examination she is unsure of. She has taken HRT.     Health maintenance:  Mammogram: Date 6 years ago Results normal  Colonoscopy: Date 10 years ago Results normal    Allergies  She is allergic to bactrim [sulfamethoxazole-trimethoprim] and nsaids (non-steroidal anti-inflammatory drug).    BMI  Body mass index is 19.83 kg/m??.    Disease Status: Systemic recurrence of disease (04/01/2018 12:43 PM)  Karnofsky Score: 100  ECOG Score: 0       Histories  She has a past medical history  of Bowel obstruction (CMS Dx) (2018) and Ovarian cancer (CMS Dx) (2014).    She has a past surgical history that includes Dilation and curettage of uterus (1979); Vaginal hysterectomy (1979); Elbow surgery (1990); Eye surgery (1994); Abdominoplasty (1999); Blepharoplasty (2000); Transumbilical augmentation mammaplasty; Rotator cuff repair; and Knee Arthroplasty (Right).    Her family history is not on file.    She reports that she has quit smoking. She has never used smokeless tobacco. She reports that she drinks alcohol. She reports that she does not use drugs.    Allergies  Bactrim [sulfamethoxazole-trimethoprim] and Nsaids (non-steroidal anti-inflammatory drug)    Medications  Outpatient Encounter Prescriptions as of 04/15/2018   Medication Sig Dispense Refill   ??? dexamethasone (DECADRON) 4 MG tablet Take 8 mg by mouth daily with food on day 2, 3, and 4 of chemotherapy cycle 18 tablet 6   ??? ergocalciferol, vitamin D2, (VITAMIN D ORAL) Take by mouth.     ??? lidocaine-prilocaine (EMLA) cream Apply topically daily as needed. Apply pea-size amount to port site 30-60 min prior to accessing port 30 g 1   ??? omeprazole (PRILOSEC) 20 MG capsule      ??? proCHLORPERazine (COMPAZINE) 10 MG tablet Take 1 tablet (10 mg total) by mouth every 6 hours as needed (For nausea and vomiting). 30 tablet 3   ??? vit b complex w-b 12 (B COMPLEX-VITAMIN B12) tablet Take 1 tablet by mouth daily.       No facility-administered encounter medications on file as of 04/15/2018.         Review of Systems   Constitutional: Negative for  activity change, appetite change, chills, fatigue, fever, weight gain and weight loss.   HENT: Negative for congestion, mouth sores, rhinorrhea, sinus pressure, sore throat and trouble swallowing.    Eyes: Negative for photophobia, redness, itching and visual disturbance.   Respiratory: Negative for apnea, cough, chest tightness and shortness of breath.    Cardiovascular: Negative for chest pain, palpitations and leg  swelling.   Gastrointestinal: Positive for abdominal pain. Negative for abdominal distention, anal bleeding, bloating, blood in stool, constipation, diarrhea, nausea and vomiting.   Genitourinary: Negative for decreased urine volume, difficulty urinating, dysuria, frequency, genital sores, hematuria, pelvic pain, urgency, vaginal bleeding, vaginal discharge and vaginal pain.   Musculoskeletal: Negative for arthralgias, back pain, joint swelling and neck pain.   Skin: Negative for color change, pallor and wound.   Neurological: Negative for dizziness, seizures, weakness, numbness and headaches.   Hematological: Negative for adenopathy. Does not bruise/bleed easily.   Psychiatric/Behavioral: Negative for agitation, confusion and depression. The patient is not nervous/anxious.        Vitals  Blood pressure 121/74, pulse 81, temperature 98.2 ??F (36.8 ??C), temperature source Oral, height 5' 4 (1.626 m), weight 115 lb 8 oz (52.4 kg), SpO2 100 %.    Physical Exam   Constitutional: She is oriented to person, place, and time. She appears well-developed and well-nourished.   HENT:   Head: Normocephalic and atraumatic.   Eyes: Conjunctivae and EOM are normal.   Neck: Normal range of motion. Neck supple. No thyromegaly present.   Cardiovascular: Normal rate, regular rhythm and normal heart sounds.    Pulmonary/Chest: Effort normal and breath sounds normal.   Abdominal: Soft. Bowel sounds are normal. She exhibits no distension and no mass. There is no tenderness. There is no rebound and no guarding.   No masses or tenderness.  No hernia palpable.   No hepatosplenomegaly.     Prior scars from xlap, tummy tuck   Genitourinary:   Genitourinary Comments: Not examined   Musculoskeletal: Normal range of motion. She exhibits no edema.   Lymphadenopathy:     She has no cervical adenopathy.   Neurological: She is alert and oriented to person, place, and time.   Skin: Skin is warm and dry.        Psychiatric: She has a normal mood and  affect. Her behavior is normal. Judgment and thought content normal.          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     Posterior cul de sac biopsy-metastatic adenocarcinoma.     Omentectomy- metastatic adenocarcinoma  LND-negative     Residual right IP ligament, Anterior cul de sac biopsy, right pelvic side will biopsy, left pelvic side wall, right gutter and left gutter-->benign    04/06/2017:   CT [name]- mild small bowel obstruction with transition point in the left pelvis adjacent to the anterior abdominal wall. Low density in the common femoral veins bilaterally thought likely to represent flow artificat, may consider follow up with lomer extremity Doppler to ensure there is no underlying deep venous thrombosis.    01/29/2018:   CTAP:  Interval development of necrotic retroperitoneal lymphadenopathy with largest node measuring 4.9  4.2 x 4.8 cm. Origin is indeterminate.  Options would include CT directed biopsy as well as PET/CT.    02/18/2018:   CA 125:  197.8    02/26/2018:   CT guided Left retroperitoneal node biopsy- focally involved by non-small cell malignancy- additional stains are diagnostic of non-small cell carcionoma and supportive of involve9ment by the patients reported previous  known ovarian malignancy.    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  No other focal areas of FD avid malignancy are found. Specifically, no lymph node activity is found outside of the retroperitoneum and there is no evidence for FDG avid pulmonary, skeletal or hepatic parenchymal metastasis.    Cancer Staging:  Cancer Staging  Malignant neoplasm of both ovaries (CMS Dx)  Staging form: Ovary, Fallopian Tube, And Primary Peritoneal Carcinoma, AJCC 8th Edition  - Clinical: FIGO Stage IIIC - Signed by Mikle Bosworth, MD on 04/01/2018      Disease Status: Systemic recurrence of disease (04/01/2018 12:43 PM)  Karnofsky Score: 100  ECOG Score: 0        Assessment & Plan  My impression is Ms. 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    We discussed recurrent ovarian cancer in detail and agree with the recommendation of Dr. Ishmael Holter for treatment platinum-based chemotherapy with Carboplatin and Paclitaxel.  The patient is interested in dose-dense Paclitaxel and we will proceed in this manner.      She is s/p port placement on 04/14/18, denies any new symptoms today. Pre-chemo labs and Genetic testing will be drawn today. OSH CTs from 01/29/18 and PET from 03/04/18 were uploaded to epic but have not been read by a radiologist yet. C1 planned for 5/10. Chemo signed today     TONNETTE ZWIEBEL  has recurrent PS EIC cancer and presents for cycle 1 of carbo/weekly taxol.   I have reviewed her labs and she is cleared chemotherapy. There is no evidence of progressive disease or excessive toxicity today, and we will continue treatment without dose  modification. We will see her back for her next cycle of treatment, and she knows to call if questions or problems arise. We have ordered nadir and pre-chemo labs and will continue monitoring in between cycles.  __Genetic testing drawn today   __Chemo on fridays, PCE on thurs prior        Pain  Pain Score: Zero (04/15/2018  1:50 PM)     She denies presence of pain.  Pain will be reassessed at next visit.  Gaspar Skeeters, MD  Ob/Gyn PGY-2      I saw and personally examined the patient today with my resident. I discussed the findings and therapeutic plan with the patient. I repeated, reviewed and agree with the history of present illness, past medical histories, family history, social history, medication list, and allergies as listed. The review of systems is as noted above. My physical exam confirms the findings listed above. Review of labs, pathology reports, radiograph reports, and medical records confirm the findings noted above. I agree with the assessment and plan as noted above. I have edited the note where appropriate.     I have personally performed a face to face diagnostic evaluation on this patient, seen initially by the resident. I have personally developed the care plan as outlined in the Assessment and Plan.      Complexity: high     I spent a total of 30 minutes face-to-face of which >50% was spent in counseling and/or coordination of care, documentation and counseling.with patient and/or family.      Topics discussed include recurrent ovarain  Cancer prognosis and treatment options and chemo management, genetic testing.      Maggie Font, MD  Gynecology Oncology  [phone]      Medical Decision Making:  The following items were considered in medical decision making:  Review / order clinical lab tests  Review / order radiology tests  Review / order other diagnostic tests/interventions

## 2018-04-16 ENCOUNTER — Ambulatory Visit: Admit: 2018-04-16 | Payer: Medicare (Managed Care)

## 2018-04-16 DIAGNOSIS — C563 Malignant neoplasm of bilateral ovaries (CMS-HCC): Secondary | ICD-10-CM

## 2018-04-16 MED ORDER — dexamethasone (DECADRON) tablet 12 mg
4 | Freq: Once | ORAL | Status: AC
Start: 2018-04-16 — End: 2018-04-16
  Administered 2018-04-16: 13:00:00 12 mg via ORAL

## 2018-04-16 MED ORDER — sodium chloride 0.9 % infusion
Freq: Every day | INTRAVENOUS | Status: AC | PRN
Start: 2018-04-16 — End: 2018-04-16

## 2018-04-16 MED ORDER — aprepitant (CINVANTI) IV emulsion 130 mg
7.2 | Freq: Once | INTRAVENOUS | Status: AC
Start: 2018-04-16 — End: 2018-04-16
  Administered 2018-04-16: 14:00:00 130 mg via INTRAVENOUS

## 2018-04-16 MED ORDER — diphenhydrAMINE (BENADRYL) injection 25 mg
50 | Freq: Once | INTRAMUSCULAR | Status: AC
Start: 2018-04-16 — End: 2018-04-16
  Administered 2018-04-16: 14:00:00 25 mg via INTRAVENOUS

## 2018-04-16 MED ORDER — CARBOplatin (PARAPLATIN) 480 mg in dextrose 5% in water (D5W) 250 mL chemo infusion
Freq: Once | INTRAVENOUS | Status: AC
Start: 2018-04-16 — End: 2018-04-16
  Administered 2018-04-16: 15:00:00 480 mg via INTRAVENOUS

## 2018-04-16 MED ORDER — heparin lock flush Syrg 500 Units
100 | Freq: Every day | INTRAVENOUS | Status: AC | PRN
Start: 2018-04-16 — End: 2018-04-16
  Administered 2018-04-16: 16:00:00 500 [IU]

## 2018-04-16 MED ORDER — hydrocortisone sod succ (PF)(SOLU-CORTEF) 100 mg injection
100 | Freq: Every day | INTRAMUSCULAR | Status: AC | PRN
Start: 2018-04-16 — End: 2018-04-16

## 2018-04-16 MED ORDER — PACLitaxel (TAXOL) 120 mg in sodium chloride 0.9 % 250 mL chemo infusion
Freq: Once | INTRAVENOUS | Status: AC
Start: 2018-04-16 — End: 2018-04-16
  Administered 2018-04-16: 14:00:00 120 mg/m2 via INTRAVENOUS

## 2018-04-16 MED ORDER — proCHLORPERazine (COMPAZINE) tablet 10 mg
10 | Freq: Four times a day (QID) | ORAL | Status: AC | PRN
Start: 2018-04-16 — End: 2018-04-16

## 2018-04-16 MED ORDER — EPINEPHrine 1 mg/mL injection
1 | Freq: Every day | INTRAMUSCULAR | Status: AC | PRN
Start: 2018-04-16 — End: 2018-04-16

## 2018-04-16 MED ORDER — sodium chloride flush 10 mL
Freq: Every day | INTRAMUSCULAR | Status: AC | PRN
Start: 2018-04-16 — End: 2018-04-16

## 2018-04-16 MED ORDER — diphenhydrAMINE (BENADRYL) injection 25 mg
50 | Freq: Every day | INTRAMUSCULAR | Status: AC | PRN
Start: 2018-04-16 — End: 2018-04-16

## 2018-04-16 MED ORDER — albuterol (PROVENTIL;VENTOLIN;PROAIR) inhaler 1-2 puff
90 | RESPIRATORY_TRACT | Status: AC | PRN
Start: 2018-04-16 — End: 2018-04-16

## 2018-04-16 MED ORDER — diphenhydrAMINE (BENADRYL) capsule 25 mg
25 | Freq: Once | ORAL | Status: AC | PRN
Start: 2018-04-16 — End: 2018-04-16

## 2018-04-16 MED ORDER — palonosetron (ALOXI) injection 0.25 mg
0.25 | Freq: Once | INTRAVENOUS | Status: AC
Start: 2018-04-16 — End: 2018-04-16
  Administered 2018-04-16: 13:00:00 0.25 mg via INTRAVENOUS

## 2018-04-16 MED ORDER — LORazepam (ATIVAN) tablet 0.5 mg
0.5 | Freq: Four times a day (QID) | ORAL | Status: AC | PRN
Start: 2018-04-16 — End: 2018-04-16

## 2018-04-16 MED ORDER — famotidine (PF) (PEPCID) injection 20 mg
20 | Freq: Once | INTRAVENOUS | Status: AC
Start: 2018-04-16 — End: 2018-04-16
  Administered 2018-04-16: 14:00:00 20 mg via INTRAVENOUS

## 2018-04-16 MED ORDER — famotidine (PEPCID) tablet 20 mg
20 | Freq: Once | ORAL | Status: AC | PRN
Start: 2018-04-16 — End: 2018-04-16

## 2018-04-16 MED FILL — SOLU-CORTEF ACT-O-VIAL (PF) 100 MG/2 ML SOLUTION FOR INJECTION: 100 100 mg/2 mL | INTRAMUSCULAR | Qty: 2

## 2018-04-16 MED FILL — PALONOSETRON 0.25 MG/5 ML INTRAVENOUS SOLUTION: 0.25 0.25 mg/5 mL | INTRAVENOUS | Qty: 5

## 2018-04-16 MED FILL — CARBOPLATIN 10 MG/ML INTRAVENOUS SOLUTION: 10 10 mg/mL | INTRAVENOUS | Qty: 48

## 2018-04-16 MED FILL — DEXAMETHASONE 4 MG TABLET: 4 4 MG | ORAL | Qty: 3

## 2018-04-16 MED FILL — FAMOTIDINE (PF) 20 MG/2 ML INTRAVENOUS SOLUTION: 20 20 mg/2 mL | INTRAVENOUS | Qty: 2

## 2018-04-16 MED FILL — CINVANTI 7.2 MG/ML INTRAVENOUS EMULSION: 7.2 7.2 mg/mL | INTRAVENOUS | Qty: 18

## 2018-04-16 MED FILL — DIPHENHYDRAMINE 50 MG/ML INJECTION SOLUTION: 50 50 mg/mL | INTRAMUSCULAR | Qty: 1

## 2018-04-16 MED FILL — PACLITAXEL 6 MG/ML CONCENTRATE,INTRAVENOUS: 6 6 mg/mL | INTRAVENOUS | Qty: 20

## 2018-04-16 NOTE — Unmapped (Signed)
Call the office for:  Fever greater than 100.4.  If having chills take your temperature.  Vomiting or diarrhea that lasts for 24 hours.  Inability to drink or keep down 2 liters of fluid a day.  Any signs or symptoms of infection (ex: chills, sore throat, ear ache, cough, redness/warmth/tenderness of any skin area, pain/burning when urinating).

## 2018-04-16 NOTE — Unmapped (Addendum)
New chemo regimen today; labs drawn yesterday with MD appt.  Consent noted yesterday 04-15-2018.  Per MD note:   04/16/2018: C1 weekly taxol 80 mg/m2 D1,8,15, Carboplatin AUC 6 D1 q 21 days. PCEs on thurs, txt on fridays  Patient asked for her CA125 results and all lab results printed out for her which was done.  Port site looks unremarkable with excellent blood return.    Patient education:  [email or web address] video Cancer Basics video watched by patient and daughter; pharmacist at chairside with lengthy teaching and [facility] new patient folder provided; RN also provided teaching.  Patient verbalized she has a thermometer to report a fever of 100.4 or greater.  Reviewed the order for what drugs she will get D1,8,15, and q 21 days etc.  Per patient profile/medication interview, the patient is taking the following herbal medications/supplements/vitamins: B complex, high dose vitamin D, vitamin E, probiotic, digestive enzymes, turmeric. Also regularly drinking juiced wheat grass and sprouts per pharmacist.  ??    Patient showed no signs of distress or complaint today, left in stable condition with AVS and future appts.  Drug handouts from RN from [place].  Pharmacist provided remaining patient ed material.  Patient told about community resources such as [facility] for massages and wigs, also explained a prescription can generally be utilized for a purchased wig as well.

## 2018-04-16 NOTE — Telephone Encounter (Signed)
REFERRED BY: Bretta Bang, PharmD  REASON FOR REFERRAL: Pt requesting information about wigs.     Sw followed up with pt via phone. Pt confirmed that she was seeking information about gaining a wig. She stated that this information might be something she already has, but stated that she hasn't processed through all the information yet. Pt stated that she is also interested in a support group and massage therapy. Pt in agreement with Sw emailing her this information in the event she doesn't have it already. Sw emailed pt information on Support Groups, placing to gain a wig and [facility]'s information concerning massage therapy. Pt stated that she was tired and the call was left short. Sw will follow up with the pt again at a later time.     PLAN: Social worker to continue to follow patient and be available as needed as no additional needs indicated at this time and appropriate referrals have been made. SW provided patient/family with contact information. Patient participated in decision making/plan. SW discussed above information with team.    Whitaker Holderman, MSSW, [place]  [phone]    Future Appointments  Date Time Provider Department Center   04/23/2018 9:00 AM CHAIR 03 INF 3 BC [facility] INF3 [facility]   04/30/2018 9:00 AM CHAIR 02 INF 3 BC [facility] INF3 [facility]   05/06/2018 2:00 PM [employer], CNP [facility] GYN3 [facility]   05/07/2018 9:00 AM CHAIR 02 INF 3 BC [facility] INF3 [facility]

## 2018-04-16 NOTE — Unmapped (Signed)
Pharmacy Chemotherapy Education    Kelsey Sullivan is a 77 y.o. female scheduled for chemotherapy with CARBOplatin and PACLitaxel for the treatment of recurrent ovarian cancer.  Patient's primary oncologist is Dr. [name].  Education was provided on 04/16/2018 to the patient and her daughter.     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 In 01/2018, she experienced mid abdominal pain, and seen by PCP and ED, with CT [name] showing interval development of necrotic retroperitoneal lymphadenopathy. CA 125 on 02/18/18 was 197.8. CT-guided nodal biopsy (02/26/18) confirming recurrent disease. 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   She lives in [place] half of the time, and [place] half of the time and has decided to pursue therapy in [place] given more family support.     Past Medical History:   Past Medical History:   Diagnosis Date   ??? Bowel obstruction (CMS Dx) 2018   ??? Ovarian cancer (CMS Dx) 2014     Disease  Cancer Staging  Malignant neoplasm of both ovaries (CMS Dx)  Staging form: Ovary, Fallopian Tube, And Primary Peritoneal Carcinoma, AJCC 8th Edition  - Clinical: FIGO Stage IIIC - Signed by Mikle Bosworth, MD on 04/01/2018    History of Treatment:  - 12/28/12: BSO, omentectomy, LND. Declined adjuvant therapy, preferring natural methods (supplements, raw diet)    CARBOplatin AUC 6 / weekly PACLitaxel (dose dense) (21 day cycle)  Ito K et al. Herma Mering Oncol. 2011 Feb;120(2):193-7. Epub 2010 Dec 22.    Regimen:   PACLitaxel 80 mg/m2 IV over 1 hour, days 1,8,15  CARBOplatin AUC 6 IV over 30 minutes, day 1    Antiemesis regimen:  Aprepitant  130 mg IVP, day 1  Palonosetron 0.25 mg IV, day 1  Dexamethasone 12 mg PO day 1, 8 mg PO daily on days 2,3,4  - 4 mg PO days 8,15  Prochlorperazine 10 mg PO q6h PRN nausea/vomiting    Supportive care:   Famotidine 20 mg IV/PO once prior to PACLitaxel, days 1,8,15  Diphenhydramine 25 mg IV/PO once prior to PACLitaxel, days 1,8,15      Medication Reconciliation and Allergies were verified with the patient and are as follows:   Allergies   Allergen Reactions   ??? Bactrim [Sulfamethoxazole-Trimethoprim] Other (See Comments)     Hallucinations, anxiety   ??? Nsaids (Non-Steroidal Anti-Inflammatory Drug)      Stomach ache   ??? Celecoxib Nausea And Vomiting   ??? Metoclopramide Other (See Comments)     Does not remember       Prior to Admission medications    Medication Sig Start Date End Date Taking? Authorizing Provider   dexamethasone (DECADRON) 4 MG tablet Take 8 mg by mouth daily with food on day 2, 3, and 4 of chemotherapy cycle 04/01/18   Angelita Ingles, CNP   digestive enzymes Cap Take by mouth.    Historical Provider, MD   ergocalciferol, vitamin D2, (VITAMIN D ORAL) Take 10,000 Units by mouth daily.     Historical Provider, MD   ibuprofen (ADVIL,MOTRIN) 200 MG tablet Take 200-800 mg by mouth every 8 hours as needed for Pain.  Historical Provider, MD   ibuprofen-diphenhydramine HCl 200-25 mg Cap Take 1 tablet by mouth at bedtime as needed.    Historical Provider, MD   Lactobac no.41/Bifidobact no.7 (PROBIOTIC-10 ORAL) Take 1 capsule by mouth daily. [employer] brand    Historical Provider, MD   lidocaine-prilocaine (EMLA) cream Apply topically daily as needed. Apply pea-size amount to port site 30-60 min prior to accessing port 04/02/18   Rosemarie Beath Omer, CNP   omeprazole (PRILOSEC) 20 MG capsule Take 20 mg by mouth daily.  01/13/18   Historical Provider, MD   proCHLORPERazine (COMPAZINE) 10 MG tablet Take 1 tablet (10 mg total) by mouth every 6 hours as needed (For nausea and vomiting). 04/01/18   Angelita Ingles, CNP   vit b complex w-b 12 (B COMPLEX-VITAMIN B12) tablet Take 1 tablet by mouth daily.    Historical Provider, MD   vitamin E 100 UNIT capsule Take 100 Units by mouth daily.    Historical Provider, MD     The following topics and adverse events were reviewed with the patient:   Please check all topics that are applicable, and list any additional topics not mentioned that were covered  [x]  Impact on quality of life (importance of prevention, ensuring communication of needs, etc)  [x]  Terminology (definition of a cycle, ???ANC???)  [x]  Low blood counts (WBC, Platelets, and RBC) putting patient at risk for life-threatening infection, bleeding, fatigue  [x]  Febrile neutropenia and monitoring for fever  [x]  Damage to GI tract, causing mouth/esophageal sores and diarrhea or constipation   [x]  Nausea and Vomiting, medications are given prior to chemotherapy to help prevent N/V   [x]  Decreased appetite and nutrition (hydration, bland foods during periods of nausea, weight loss and importance of caloric intake)  [x]  Fever/constitutional symptoms  [x]  Damage to kidneys    []  Damage to heart   [x]  Damage to liver    [x]  Nerve damage (Peripheral neuropathy)-- tingling in fingers/toes  []  Rash and other toxicities to skin, nails  [x]  Allergic/infusion reaction   []  Hearing changes/loss   []  Tumor Lysis Syndrome   [x]  Hair loss   []  Extravasation   []  Risk of secondary malignancy   [x]  Other: Arthralgias/myalgias    It was reiterated these are the most common side effects and the list may not be all-inclusive. Side effects may be severe, up to and including death.  Patient stated understanding of the chemotherapy side effects and all of the questions from the patient and/or family members were answered.      Possible Barriers to Education/Adherence Include:   ?? None identified    Patient's home medications relating to chemotherapy have been picked up from the pharmacy [facility] - [place], [place] - [address] AT [facility] [address]  [address]  [place] [address]  Phone: [phone] Fax: [phone]  These medications include:   Dexamethasone, prochlorperazine    Pretesting and Access:   ?? IV access: port    Materials Given:   ?? Chemotherapy and You & Eating hints books  ?? [facility] information sheets on CARBOplatin and PACLitaxel  ?? Chemotherapy calendar outlining antiemetic regimen  ?? [place] Chemotherapy Tip sheet  ?? IV/PO Hazardous Medication handling sheet  ?? Business card and/or treatment team handout    Other Issues:   Per patient profile/medication interview, the patient is taking the following herbal medications/supplements/vitamins: B complex, high dose vitamin D, vitamin E, probiotic, digestive enzymes,  turmeric. Also regularly drinking juiced wheat grass and sprouts,     Discussed with patient/family that the many of the effects of these medications on chemotherapy are unknown, and could lead to additional adverse effects and/or compromise the activity of the chemotherapy. Recommend holding the following until completion of chemotherapy:  All listed above.     Patient/family expressed understanding, and is willing to stop use of turmeric at this time, given anticoagulant properties    Thank you for the opportunity to participate in the care of Kelsey Sullivan.  Pharmacy will continue to follow.    Doreen Beam, PharmD, BCOP, BCPS  Clinical Pharmacy Specialist, Medical and Gynecologic Oncology  [facility]  Phone: [phone] (desk)   [phone] (ASCOM phone)   Cell: [phone]  Oncall pager: [phone]

## 2018-04-19 ENCOUNTER — Ambulatory Visit: Payer: Medicare (Managed Care)

## 2018-04-19 MED ORDER — sodium chloride 0.9 % infusion 1,000 mL
INTRAVENOUS | Status: AC
Start: 2018-04-19 — End: 2018-04-19

## 2018-04-19 MED FILL — SODIUM CHLORIDE 0.9 % INTRAVENOUS SOLUTION: 1000.00 1000.00 mL | INTRAVENOUS | Qty: 1000

## 2018-04-19 NOTE — Telephone Encounter (Signed)
Patient had chemo on Thursday and she is having problems sleeping and is nauseated and also diarrhea.  Marland Kitchen

## 2018-04-19 NOTE — Telephone Encounter (Signed)
RTC to patient who stated that starting Sunday night she had nausea, went to bed, then woke up with still nausea took a compazine and then started with extreme diarrhea.  She stated she has had 12 episodes of diarrhea and has been waking up in the night with nightmares.  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 Asked that the patient please come in to for blood work/stool sample and possible IV hydration.  She said she would come in to clinic today.

## 2018-04-19 NOTE — Unmapped (Signed)
Addended byMarlowe Sax on: 04/19/2018 01:11 PM     Modules accepted: Orders

## 2018-04-19 NOTE — Telephone Encounter (Signed)
Patient called in and stated that she was able to eat lunch and have some water and tea without diarrhea so she doesn't feel a need to come in now, and feels to drowsy to drive.  She stated that she would call back if the diarrhea started up again and could maybe come in tomorrow.

## 2018-04-19 NOTE — Nursing Note (Signed)
Patient cx.

## 2018-04-21 NOTE — Telephone Encounter (Signed)
Patient called and spoke to her, she just wanted to inform us that her diarrhea had stopped Monday however after lunch yesterday it returned and she was up all night again with abdominal cramping/bloating & diarrhea.  However, she does not want to come in, I did ask if she would at least stop by to get a specimen cup to submit for possible c.diff since she does have a hx of it.  She stated that she would try today.

## 2018-04-23 ENCOUNTER — Ambulatory Visit: Admit: 2018-04-23 | Payer: Medicare (Managed Care)

## 2018-04-23 ENCOUNTER — Other Ambulatory Visit: Admit: 2018-04-23 | Payer: Medicare (Managed Care)

## 2018-04-23 ENCOUNTER — Encounter

## 2018-04-23 DIAGNOSIS — Z5111 Encounter for antineoplastic chemotherapy: Secondary | ICD-10-CM

## 2018-04-23 DIAGNOSIS — C563 Malignant neoplasm of bilateral ovaries (CMS-HCC): Secondary | ICD-10-CM

## 2018-04-23 LAB — COMPREHENSIVE METABOLIC PANEL
ALT: 19 U/L (ref 7–52)
AST: 23 U/L (ref 13–39)
Albumin: 4.1 g/dL (ref 3.5–5.7)
Alkaline Phosphatase: 60 U/L (ref 36–125)
Anion Gap: 8 mmol/L (ref 3–16)
BUN: 16 mg/dL (ref 7–25)
CO2: 28 mmol/L (ref 21–33)
Calcium: 9.3 mg/dL (ref 8.6–10.3)
Chloride: 96 mmol/L — ABNORMAL LOW (ref 98–110)
Creatinine: 0.59 mg/dL — ABNORMAL LOW (ref 0.60–1.30)
Glucose: 137 mg/dL — ABNORMAL HIGH (ref 70–100)
Osmolality, Calculated: 277 mosm/kg — ABNORMAL LOW (ref 278–305)
Potassium: 4.1 mmol/L (ref 3.5–5.3)
Sodium: 132 mmol/L — ABNORMAL LOW (ref 133–146)
Total Bilirubin: 0.4 mg/dL (ref 0.0–1.5)
Total Protein: 6.6 g/dL (ref 6.4–8.9)
eGFR AA CKD-EPI: >90 See note.
eGFR NONAA CKD-EPI: 89 See note.

## 2018-04-23 LAB — CBC
Hematocrit: 33.3 % — ABNORMAL LOW (ref 35.0–45.0)
Hemoglobin: 11.6 g/dL — ABNORMAL LOW (ref 11.7–15.5)
MCH: 34 pg — ABNORMAL HIGH (ref 27.0–33.0)
MCHC: 35 g/dL (ref 32.0–36.0)
MCV: 97.3 fL (ref 80.0–100.0)
MPV: 7.9 fL (ref 7.5–11.5)
Platelets: 240 10E3/uL (ref 140–400)
RBC: 3.42 10E6/uL — ABNORMAL LOW (ref 3.80–5.10)
RDW: 12.8 % (ref 11.0–15.0)
WBC: 4.9 10E3/uL (ref 3.8–10.8)

## 2018-04-23 LAB — DIFFERENTIAL
Basophils Absolute: 25 /uL (ref 0–200)
Basophils Relative: 0.5 % (ref 0.0–1.0)
Eosinophils Absolute: 103 /uL (ref 15–500)
Eosinophils Relative: 2.1 % (ref 0.0–8.0)
Lymphocytes Absolute: 1401 /uL (ref 850–3900)
Lymphocytes Relative: 28.6 % (ref 15.0–45.0)
Monocytes Absolute: 294 /uL (ref 200–950)
Monocytes Relative: 6 % (ref 0.0–12.0)
Neutrophils Absolute: 3077 /uL (ref 1500–7800)
Neutrophils Relative: 62.8 % (ref 40.0–80.0)
nRBC: 0 /100{WBCs} (ref 0–0)

## 2018-04-23 LAB — MAGNESIUM: Magnesium: 1.6 mg/dL (ref 1.5–2.5)

## 2018-04-23 LAB — CLOSTRIDIUM DIFFICILE DNA AMPLIFICATION: Clost. Diff DNA Amp.: NEGATIVE

## 2018-04-23 MED ORDER — diphenhydrAMINE (BENADRYL) injection 25 mg
50 | Freq: Once | INTRAMUSCULAR | Status: AC
Start: 2018-04-23 — End: 2018-04-23

## 2018-04-23 MED ORDER — ondansetron (ZOFRAN) 4 MG tablet
4 | ORAL_TABLET | Freq: Three times a day (TID) | ORAL | 2 refills | Status: AC | PRN
Start: 2018-04-23 — End: 2018-11-18

## 2018-04-23 MED ORDER — sodium chloride flush 10 mL
Freq: Every day | INTRAMUSCULAR | Status: AC | PRN
Start: 2018-04-23 — End: 2018-04-23

## 2018-04-23 MED ORDER — dexamethasone (DECADRON) tablet 4 mg
4 | Freq: Once | ORAL | Status: AC
Start: 2018-04-23 — End: 2018-04-23
  Administered 2018-04-23: 14:00:00 4 mg via ORAL

## 2018-04-23 MED ORDER — zolpidem (AMBIEN) 5 MG tablet
5 | ORAL_TABLET | Freq: Every evening | ORAL | 0 refills | Status: AC | PRN
Start: 2018-04-23 — End: 2018-04-30

## 2018-04-23 MED ORDER — hydrocortisone sod succ (PF)(SOLU-CORTEF) 100 mg injection
100 | Freq: Every day | INTRAMUSCULAR | Status: AC | PRN
Start: 2018-04-23 — End: 2018-04-23

## 2018-04-23 MED ORDER — sodium chloride 0.9 % infusion
Freq: Every day | INTRAVENOUS | Status: AC | PRN
Start: 2018-04-23 — End: 2018-04-23

## 2018-04-23 MED ORDER — famotidine (PF) (PEPCID) injection 20 mg
20 | Freq: Once | INTRAVENOUS | Status: AC
Start: 2018-04-23 — End: 2018-04-23

## 2018-04-23 MED ORDER — albuterol (PROVENTIL;VENTOLIN;PROAIR) inhaler 1-2 puff
90 | RESPIRATORY_TRACT | Status: AC | PRN
Start: 2018-04-23 — End: 2018-04-23

## 2018-04-23 MED ORDER — diphenhydrAMINE (BENADRYL) capsule 25 mg
25 | Freq: Once | ORAL | Status: AC | PRN
Start: 2018-04-23 — End: 2018-04-23
  Administered 2018-04-23: 14:00:00 25 mg via ORAL

## 2018-04-23 MED ORDER — heparin lock flush Syrg 500 Units
100 | Freq: Every day | INTRAVENOUS | Status: AC | PRN
Start: 2018-04-23 — End: 2018-04-23

## 2018-04-23 MED ORDER — diphenhydrAMINE (BENADRYL) injection 25 mg
50 | Freq: Every day | INTRAMUSCULAR | Status: AC | PRN
Start: 2018-04-23 — End: 2018-04-23

## 2018-04-23 MED ORDER — PACLitaxel (TAXOL) 120 mg in sodium chloride 0.9 % 250 mL chemo infusion
6 | Freq: Once | INTRAVENOUS | Status: AC
Start: 2018-04-23 — End: 2018-04-23
  Administered 2018-04-23: 15:00:00 120 mg/m2 via INTRAVENOUS

## 2018-04-23 MED ORDER — famotidine (PEPCID) tablet 20 mg
20 | Freq: Once | ORAL | Status: AC | PRN
Start: 2018-04-23 — End: 2018-04-23
  Administered 2018-04-23: 14:00:00 20 mg via ORAL

## 2018-04-23 MED ORDER — EPINEPHrine 1 mg/mL injection
1 | Freq: Every day | INTRAMUSCULAR | Status: AC | PRN
Start: 2018-04-23 — End: 2018-04-23

## 2018-04-23 MED ORDER — LORazepam (ATIVAN) tablet 0.5 mg
0.5 | Freq: Four times a day (QID) | ORAL | Status: AC | PRN
Start: 2018-04-23 — End: 2018-04-23

## 2018-04-23 MED FILL — FAMOTIDINE 20 MG TABLET: 20 20 MG | ORAL | Qty: 1

## 2018-04-23 MED FILL — DEXAMETHASONE 4 MG TABLET: 4 4 MG | ORAL | Qty: 1

## 2018-04-23 MED FILL — PACLITAXEL 6 MG/ML CONCENTRATE,INTRAVENOUS: 6 6 mg/mL | INTRAVENOUS | Qty: 20

## 2018-04-23 MED FILL — DIPHENHYDRAMINE 25 MG CAPSULE: 25 25 mg | ORAL | Qty: 1

## 2018-04-23 NOTE — Unmapped (Signed)
Lab draw from Venous Access Device

## 2018-04-23 NOTE — Unmapped (Signed)
Addended by: Derenda Mis on: 04/23/2018 01:36 PM     Modules accepted: Orders

## 2018-04-23 NOTE — Unmapped (Signed)
Outpatient Social Work Consultation:    Referred RU:[id number] Schedule    Reason for Referral: OCM    Biopsychosocial Assessment    Presenting Problem:  Kelsey Sullivan is a 77 y.o.White or Caucasian female who presented to clinic for infusion.     No diagnosis found.    Current Living Arrangements:  Lives Alone    Environment:  Number of Stairs in the Home: 13 with railings.     Support System:  Pt's support system includes her daughter, Bard Herbert, and her friend, Merlyn Albert.     Support Person:  Name: Lolita Cram   Relationship to Patient: Daughter   Phone Number(s): [phone]    Advanced Directives:   Discussed with Patient/Requires further discussion   Pt stated that her daughter, Lolita Cram, is next of kin and HCPOA. This Sw does not see HCPOA on file.     Barriers to Care Related to Living Situation:   No barriers noted    Cultural/Spiritual/Language Barriers:   Patient's primary language is [place].     Mental Health:   No flowsheet data found.   Pt denied mental health history. Pt did express interest in a Cancer Support Group. Sw educated the pt on her options for support and pt confirmed that she was interested in what Cancer Support Community has to offer her. Sw provided pt with [facility] booklet/ calendar and [facility] Women to Women Group along with a Agricultural consultant Group.     Chemical Dependence:  Did not assess    Financial:  Patient does not express financial concerns    Vocational:  Patient retired Scientist, forensic.     Legal Barriers to Care:  No legal issues    Education/Literacy:  Pt stated that she has her LPN.     Transportation:  Drives own vehicle, but has assistance from family and friends for treatment appointments.     Level of Functioning/Activities of Daily Living  ADL List:   Mobility  independent  Toileting  independent  Bathing  independent  Grooming  independent  Dressing  independent  Eating  independent  Bill Pay  independent  Shopping  independent  Meal Preparation  independent  Driving or  Transportation Arrangements  some assistance  Medication Management  independent  Using Phone  independent    Durable Medical/Adaptive Equipment:  Pt does not require the use of DME.     Identified Need(s) and Intervention(s):    Needs: Counseling    Interventions:Assessment provided [facility] Support Group information.     Clinical Impressions:    Met with pt in clinic who appeared well dressed and well kempt. Pt was very engaged in speaking with Sw, but appeared a bit overwhelmed with all the changes in her medical needs. Pt's daughter described the pt as hyper. Pt appears very involved and active in meeting her health needs and is open to further support     Follow Up Plan:    Patient/Family are aware of options and participated in decision making/plan. Sw will follow up with pt to make sure she was able to get involved in her sought out support group.     Plan communicated to team.    Future Appointments  Date Time Provider Department Center   04/30/2018 9:00 AM CHAIR 02 INF 3 BC [facility] INF3 [facility]   05/06/2018 2:00 PM [employer], CNP [facility] GYN3 [facility]   05/07/2018 9:00 AM CHAIR 02 INF 3 BC [facility] INF3 [facility]         .

## 2018-04-23 NOTE — Nursing Note (Signed)
Kelsey Sullivan arrives for treatment today with taxol.  Labs reviewed, pt discusses her sleeping difficulty with Kelsey Slade, NP.  Fall assessment completed, pt is low risk, oriented to room and call light given.  Pt tolerated treatment without difficulty, AVS given.  Pt left ambulatory.

## 2018-04-30 ENCOUNTER — Other Ambulatory Visit: Admit: 2018-04-30 | Payer: Medicare (Managed Care)

## 2018-04-30 ENCOUNTER — Ambulatory Visit: Admit: 2018-04-30 | Payer: Medicare (Managed Care)

## 2018-04-30 DIAGNOSIS — C563 Malignant neoplasm of bilateral ovaries (CMS-HCC): Secondary | ICD-10-CM

## 2018-04-30 DIAGNOSIS — C561 Malignant neoplasm of right ovary: Secondary | ICD-10-CM

## 2018-04-30 LAB — COMPREHENSIVE METABOLIC PANEL
ALT: 30 U/L (ref 7–52)
AST: 33 U/L (ref 13–39)
Albumin: 4 g/dL (ref 3.5–5.7)
Alkaline Phosphatase: 68 U/L (ref 36–125)
Anion Gap: 7 mmol/L (ref 3–16)
BUN: 11 mg/dL (ref 7–25)
CO2: 27 mmol/L (ref 21–33)
Calcium: 8.9 mg/dL (ref 8.6–10.3)
Chloride: 99 mmol/L (ref 98–110)
Creatinine: 0.6 mg/dL (ref 0.60–1.30)
Glucose: 112 mg/dL (ref 70–100)
Osmolality, Calculated: 276 mOsm/kg (ref 278–305)
Potassium: 4.4 mmol/L (ref 3.5–5.3)
Sodium: 133 mmol/L (ref 133–146)
Total Bilirubin: 0.3 mg/dL (ref 0.0–1.5)
Total Protein: 6.5 g/dL (ref 6.4–8.9)
eGFR AA CKD-EPI: >90 See note.
eGFR NONAA CKD-EPI: 89 See note.

## 2018-04-30 LAB — CBC
Hematocrit: 29.5 % (ref 35.0–45.0)
Hemoglobin: 10.4 g/dL (ref 11.7–15.5)
MCH: 34.3 pg (ref 27.0–33.0)
MCHC: 35.3 g/dL (ref 32.0–36.0)
MCV: 97.2 fL (ref 80.0–100.0)
MPV: 7 fL (ref 7.5–11.5)
Platelets: 251 10*3/uL (ref 140–400)
RBC: 3.03 10*6/uL (ref 3.80–5.10)
RDW: 13.1 % (ref 11.0–15.0)
WBC: 3.7 10*3/uL (ref 3.8–10.8)

## 2018-04-30 LAB — DIFFERENTIAL
Basophils Absolute: 26 /uL (ref 0–200)
Basophils Relative: 0.7 % (ref 0.0–1.0)
Eosinophils Absolute: 37 /uL (ref 15–500)
Eosinophils Relative: 1 % (ref 0.0–8.0)
Lymphocytes Absolute: 1051 /uL (ref 850–3900)
Lymphocytes Relative: 28.4 % (ref 15.0–45.0)
Monocytes Absolute: 322 /uL (ref 200–950)
Monocytes Relative: 8.7 % (ref 0.0–12.0)
Neutrophils Absolute: 2264 /uL (ref 1500–7800)
Neutrophils Relative: 61.2 % (ref 40.0–80.0)
nRBC: 0 /100{WBCs} (ref 0–0)

## 2018-04-30 LAB — MAGNESIUM: Magnesium: 1.7 mg/dL (ref 1.5–2.5)

## 2018-04-30 MED ORDER — hydrocortisone sod succ (PF)(SOLU-CORTEF) 100 mg injection
100 | Freq: Every day | INTRAMUSCULAR | Status: AC | PRN
Start: 2018-04-30 — End: 2018-04-30

## 2018-04-30 MED ORDER — diphenhydrAMINE (BENADRYL) injection 25 mg
50 | Freq: Once | INTRAMUSCULAR | Status: AC
Start: 2018-04-30 — End: 2018-04-30

## 2018-04-30 MED ORDER — LORazepam (ATIVAN) tablet 0.5 mg
0.5 | Freq: Four times a day (QID) | ORAL | Status: AC | PRN
Start: 2018-04-30 — End: 2018-04-30

## 2018-04-30 MED ORDER — sodium chloride flush 10 mL
Freq: Every day | INTRAMUSCULAR | Status: AC | PRN
Start: 2018-04-30 — End: 2018-04-30

## 2018-04-30 MED ORDER — famotidine (PF) (PEPCID) injection 20 mg
20 | Freq: Once | INTRAVENOUS | Status: AC
Start: 2018-04-30 — End: 2018-04-30

## 2018-04-30 MED ORDER — PACLitaxel (TAXOL) 120 mg in sodium chloride 0.9 % 250 mL chemo infusion
6 | Freq: Once | INTRAVENOUS | Status: AC
Start: 2018-04-30 — End: 2018-04-30
  Administered 2018-04-30: 14:00:00 120 mg/m2 via INTRAVENOUS

## 2018-04-30 MED ORDER — albuterol (PROVENTIL;VENTOLIN;PROAIR) inhaler 1-2 puff
90 | RESPIRATORY_TRACT | Status: AC | PRN
Start: 2018-04-30 — End: 2018-04-30

## 2018-04-30 MED ORDER — sodium chloride 0.9 % infusion
Freq: Every day | INTRAVENOUS | Status: AC | PRN
Start: 2018-04-30 — End: 2018-04-30

## 2018-04-30 MED ORDER — famotidine (PEPCID) tablet 20 mg
20 | Freq: Once | ORAL | Status: AC | PRN
Start: 2018-04-30 — End: 2018-04-30
  Administered 2018-04-30: 14:00:00 20 mg via ORAL

## 2018-04-30 MED ORDER — diphenhydrAMINEBENADRYLcapsule25mg
25 | Freq: Once | ORAL | Status: AC | PRN
Start: 2018-04-30 — End: 2018-04-30
  Administered 2018-04-30: 14:00:00 25 mg via ORAL

## 2018-04-30 MED ORDER — diphenhydrAMINE (BENADRYL) injection 25 mg
50 | Freq: Every day | INTRAMUSCULAR | Status: AC | PRN
Start: 2018-04-30 — End: 2018-04-30

## 2018-04-30 MED ORDER — heparin lock flush Syrg 500 Units
100 | Freq: Every day | INTRAVENOUS | Status: AC | PRN
Start: 2018-04-30 — End: 2018-04-30
  Administered 2018-04-30: 15:00:00 500 [IU]

## 2018-04-30 MED ORDER — EPINEPHrine 1 mg/mL injection
1 | Freq: Every day | INTRAMUSCULAR | Status: AC | PRN
Start: 2018-04-30 — End: 2018-04-30

## 2018-04-30 MED ORDER — dexamethasone (DECADRON) tablet 4 mg
4 | Freq: Once | ORAL | Status: AC
Start: 2018-04-30 — End: 2018-04-30
  Administered 2018-04-30: 14:00:00 4 mg via ORAL

## 2018-04-30 MED FILL — DEXAMETHASONE 4 MG TABLET: 4 4 MG | ORAL | Qty: 1

## 2018-04-30 MED FILL — FAMOTIDINE 20 MG TABLET: 20 20 MG | ORAL | Qty: 1

## 2018-04-30 MED FILL — PACLITAXEL 6 MG/ML CONCENTRATE,INTRAVENOUS: 6 6 mg/mL | INTRAVENOUS | Qty: 20

## 2018-04-30 MED FILL — DIPHENHYDRAMINE 25 MG CAPSULE: 25 25 mg | ORAL | Qty: 1

## 2018-04-30 NOTE — Nursing Note (Signed)
Kelsey Sullivan arrives for treatment with taxol.  Labs reviewed, denies any new issues. Fall assessment completed, pt is low risk, oriented to room and call light given.Pt tolerated treatment without difficulty, AVS given.  Pt left ambulatory with daughter.

## 2018-04-30 NOTE — Unmapped (Signed)
Lab draw from Venous Access Device

## 2018-05-06 ENCOUNTER — Other Ambulatory Visit: Admit: 2018-05-06 | Payer: Medicare (Managed Care)

## 2018-05-06 ENCOUNTER — Ambulatory Visit: Payer: Medicare (Managed Care)

## 2018-05-06 ENCOUNTER — Ambulatory Visit: Admit: 2018-05-06 | Discharge: 2018-05-06 | Payer: Medicare (Managed Care) | Attending: Gerontology

## 2018-05-06 ENCOUNTER — Ambulatory Visit: Admit: 2018-05-06 | Payer: Medicare (Managed Care)

## 2018-05-06 DIAGNOSIS — L659 Nonscarring hair loss, unspecified: Secondary | ICD-10-CM

## 2018-05-06 DIAGNOSIS — C563 Malignant neoplasm of bilateral ovaries (CMS-HCC): Secondary | ICD-10-CM

## 2018-05-06 LAB — CBC
Hematocrit: 28.9 % (ref 35.0–45.0)
Hemoglobin: 10.1 g/dL (ref 11.7–15.5)
MCH: 34.5 pg (ref 27.0–33.0)
MCHC: 35.1 g/dL (ref 32.0–36.0)
MCV: 98.2 fL (ref 80.0–100.0)
MPV: 7.2 fL (ref 7.5–11.5)
Platelets: 223 10*3/uL (ref 140–400)
RBC: 2.94 10*6/uL (ref 3.80–5.10)
RDW: 13.6 % (ref 11.0–15.0)
WBC: 3.4 10*3/uL (ref 3.8–10.8)

## 2018-05-06 LAB — COMPREHENSIVE METABOLIC PANEL
ALT: 19 U/L (ref 7–52)
AST: 21 U/L (ref 13–39)
Albumin: 4 g/dL (ref 3.5–5.7)
Alkaline Phosphatase: 64 U/L (ref 36–125)
Anion Gap: 6 mmol/L (ref 3–16)
BUN: 14 mg/dL (ref 7–25)
CO2: 25 mmol/L (ref 21–33)
Calcium: 9 mg/dL (ref 8.6–10.3)
Chloride: 100 mmol/L (ref 98–110)
Creatinine: 0.55 mg/dL (ref 0.60–1.30)
Glucose: 248 mg/dL (ref 70–100)
Osmolality, Calculated: 281 mOsm/kg (ref 278–305)
Potassium: 4.4 mmol/L (ref 3.5–5.3)
Sodium: 131 mmol/L (ref 133–146)
Total Bilirubin: 0.3 mg/dL (ref 0.0–1.5)
Total Protein: 6.4 g/dL (ref 6.4–8.9)
eGFR AA CKD-EPI: >90 See note.
eGFR NONAA CKD-EPI: >90 See note.

## 2018-05-06 LAB — DIFFERENTIAL
Basophils Absolute: 27 /uL (ref 0–200)
Basophils Relative: 0.8 % (ref 0.0–1.0)
Eosinophils Absolute: 27 /uL (ref 15–500)
Eosinophils Relative: 0.8 % (ref 0.0–8.0)
Lymphocytes Absolute: 1013 /uL (ref 850–3900)
Lymphocytes Relative: 29.8 % (ref 15.0–45.0)
Monocytes Absolute: 190 /uL (ref 200–950)
Monocytes Relative: 5.6 % (ref 0.0–12.0)
Neutrophils Absolute: 2142 /uL (ref 1500–7800)
Neutrophils Relative: 63 % (ref 40.0–80.0)
nRBC: 0 /100 WBC (ref 0–0)

## 2018-05-06 MED ORDER — sodium chloride flush 10 mL
Freq: Every day | INTRAMUSCULAR | Status: AC | PRN
Start: 2018-05-06 — End: 2018-05-07
  Administered 2018-05-06: 18:00:00 10 mL via INTRAVENOUS

## 2018-05-06 MED ORDER — heparin lock flush Syrg 500 Units
100 | Freq: Every day | INTRAVENOUS | Status: AC | PRN
Start: 2018-05-06 — End: 2018-05-07
  Administered 2018-05-06: 18:00:00 500 [IU] via INTRAVENOUS

## 2018-05-06 NOTE — Unmapped (Signed)
Addended by: Golden Circle on: 05/06/2018 02:51 PM     Modules accepted: Orders

## 2018-05-06 NOTE — Nursing Note (Signed)
1345 Labs obtained  Port was accessed per protocol. Blood return noted. Flushed with 20ml of normal saline, followed with 5ml of 100u/ml of heparinized saline. Huber needle was removed. Band aid applied.

## 2018-05-06 NOTE — Unmapped (Signed)
History of Present Illness  Kelsey Sullivan is a 77 y.o. female with   No chief complaint on file.    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 She declined adjuvant therapy. She used natural methods, went to a raw diet for a year, etc.  She developed a small bowel obstruction in 03/2017, resolved on its own, with NED at that time.  In 01/2018, she experienced mid abdominal pain, and seen by PCP and ED, with CT of abdomen and pelvis showing interval development of necrotic retroperitoneal lymphadenopathy with largest node measuring 4.9  4.2 x 4.8 cm. Her CA 125 on 02/18/18 was 197.8.  She underwent a ct-guided nodal biopsy on 02/26/18, with pathology confirming recurrent disease.  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 She was seen at [facility] in [place] with Dr. Ishmael Holter and was recommended to undergo chemotherapy with Carboplatin and Paclitaxel-weekly.  The patient is interested in dose-dense Paclitaxel and we will proceed in this manner. Foundation 1 testing was also sent.     She lives in [place] half of the time, and [place] half of the time and has decided to pursue therapy in [place] given more family support.       Kelsey Sullivan is doing well. She reports she has been eating well, and gained a few lbs, now up to 118 lbs.   She reports less tenderness in her  abdomen, but did note after mowing grass she had increased tenderness that went away after sitting in [place]. Now only intermittent   She denies any bloating or fullness.  She reports normal bowel/bladder habits. She did have diarrhea for 1 day following cycle 1 day 1, this has improved, did check C-diff and it was negative.   She denies any GU concerns. Denies any dysuria, frequency, hematuria, flank pain or fevers.  She denies any vaginal bleeding or discharge.   She denies neuropathy.   She reports no leg swelling.   Her appetite is good, and reports no nausea or vomiting. Weight 118 lbs      Patient Active Problem List    Diagnosis   ??? 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 Pt declined adjuvant therapy      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     Posterior cul de sac biopsy-metastatic adenocarcinoma.     Omentectomy- metastatic adenocarcinoma     LND-negative     Residual right IP ligament, Anterior cul de sac biopsy, right pelvic side will biopsy, left pelvic side wall, right gutter and left gutter-->benign    04/06/2017:   CT [name]- mild small bowel obstruction with transition point in the left pelvis adjacent to the anterior abdominal wall. Low density in the common femoral veins bilaterally thought likely to represent flow artificat, may consider follow up with lomer extremity Doppler to ensure there is no underlying deep venous  thrombosis.    01/29/2018:   CTAP:  Interval development of necrotic retroperitoneal lymphadenopathy with largest node measuring 4.9  4.2 x 4.8 cm. Origin is indeterminate.  Options would include CT directed biopsy as well as PET/CT.    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  No other focal areas of FD avid malignancy are found. Specifically, no lymph node activity is found outside of the retroperitoneum and there is no evidence for FDG avid pulmonary, skeletal or hepatic parenchymal metastasis.    02/2018:   Patient saw Dr. Ishmael Holter who recommended Carbo/Taxol with an AUC of 6.  Dr. Maren Reamer office submitted foundation one testing. Patient would like Genetic Testing    04/12/2018:   IR port placed    04/16/2018:   C1 weekly taxol 80 mg/m2 D1,8,15, Carboplatin AUC 6 D1 q 21 days. PCEs on thurs, txt on fridays. EXAMS ON EVEN CYCLES     CA 125: 523.4     Genetic testing     Disposition: chemo, imaging after 3 cucles, CA 125, possible parp maintenance after, genetic testing next visit, foundation one testing results from [facility] in [place]  Current disease status:   Genetics:  Survivorship plan: pending completion of treatment              Past Medical History  She  has a past medical history of Bowel obstruction (CMS Dx) (2018) and Ovarian cancer (CMS Dx) (2014).  Past Medical History:   Diagnosis Date   ??? Bowel obstruction (CMS Dx) 2018   ??? Ovarian cancer (CMS Dx) 2014       Past Surgical History  Past Surgical History:   Procedure Laterality Date   ??? ABDOMINOPLASTY  1999   ??? BLEPHAROPLASTY   2000   ??? DILATION AND CURETTAGE OF UTERUS  1979   ??? ELBOW SURGERY  1990   ??? EYE SURGERY  1994   ??? KNEE ARTHROPLASTY Right    ??? ROTATOR CUFF REPAIR     ??? TRANSUMBILICAL AUGMENTATION MAMMAPLASTY     ??? VAGINAL HYSTERECTOMY  1979       Family History  No family history on file.    Family cancer history for ovarian, uterine and colon cancer is negative other than above.       Social History  Social History     Social History   ??? Marital status: Divorced  Spouse name: N/A   ??? Number of children: N/A   ??? Years of education: N/A     Social History Main Topics   ??? Smoking status: Former Smoker   ??? Smokeless tobacco: Never Used   ??? Alcohol use Yes      Comment: 2-3 drinks a day   ??? Drug use: No   ??? Sexual activity: Not on file     Other Topics Concern   ??? Caffeine Use No   ??? Occupational Exposure No   ??? Exercise Yes   ??? Seat Belt Yes     Social History Narrative    Mammogram-6 years ago and it was normal. Pt stated she isn't getting them anymore.      Colonoscopy-10 years ago        Pt reports she can lie flat in bed without SOB.    Pt states she can walk a flight of stairs and city block without chest pain and SOB>         Past OB/GYN History  G3P2  She reports menarche at age 27 and menopause at age 52-surgical.  She denies a history of STIs.  She denies a history of abnormal cervical cytology and reports her last cytologic examination she is unsure of. She has taken HRT.     Health maintenance:  Mammogram: Date 6 years ago Results normal  Colonoscopy: Date 10 years ago Results normal    Allergies  She is allergic to bactrim [sulfamethoxazole-trimethoprim]; nsaids (non-steroidal anti-inflammatory drug); celecoxib; and metoclopramide.    BMI  There is no height or weight on file to calculate BMI.    Disease Status: Systemic recurrence of disease (04/01/2018 12:43 PM)          Histories  She has a past medical history of Bowel obstruction (CMS Dx) (2018) and Ovarian cancer (CMS Dx) (2014).    She has a past surgical history  that includes Dilation and curettage of uterus (1979); Vaginal hysterectomy (1979); Elbow surgery (1990); Eye surgery (1994); Abdominoplasty (1999); Blepharoplasty (2000); Transumbilical augmentation mammaplasty; Rotator cuff repair; and Knee Arthroplasty (Right).    Her family history is not on file.    She reports that she has quit smoking. She has never used smokeless tobacco. She reports that she drinks alcohol. She reports that she does not use drugs.    Allergies  Bactrim [sulfamethoxazole-trimethoprim]; Nsaids (non-steroidal anti-inflammatory drug); Celecoxib; and Metoclopramide    Medications  Outpatient Encounter Prescriptions as of 05/06/2018   Medication Sig Dispense Refill   ??? dexamethasone (DECADRON) 4 MG tablet Take 8 mg by mouth daily with food on day 2, 3, and 4 of chemotherapy cycle 18 tablet 6   ??? digestive enzymes Cap Take by mouth.     ??? ergocalciferol, vitamin D2, (VITAMIN D ORAL) Take 10,000 Units by mouth daily.            ??? ibuprofen (ADVIL,MOTRIN) 200 MG tablet Take 200-800 mg by mouth every 8 hours as needed for Pain.     ??? ibuprofen-diphenhydramine HCl 200-25 mg Cap Take 1 tablet by mouth at bedtime as needed.     ??? Lactobac no.41/Bifidobact no.7 (PROBIOTIC-10 ORAL) Take 1 capsule by mouth daily. [employer] brand     ??? lidocaine-prilocaine (EMLA) cream Apply topically daily as needed. Apply pea-size amount to port site 30-60 min prior to accessing port 30 g 1   ??? omeprazole (PRILOSEC) 20 MG capsule Take 20 mg by mouth daily.            ???  ondansetron (ZOFRAN) 4 MG tablet Take 1 tablet (4 mg total) by mouth every 8 hours as needed for Nausea. Must wait to take until 5 days after receiving Aloxi 20 tablet 2   ??? proCHLORPERazine (COMPAZINE) 10 MG tablet Take 1 tablet (10 mg total) by mouth every 6 hours as needed (For nausea and vomiting). 30 tablet 3   ??? vit b complex w-b 12 (B COMPLEX-VITAMIN B12) tablet Take 1 tablet by mouth daily.     ??? vitamin E 100 UNIT capsule Take 100 Units by  mouth daily.       No facility-administered encounter medications on file as of 05/06/2018.         Review of Systems   Constitutional: Negative for activity change, appetite change, chills, fatigue, fever, weight gain and weight loss.   HENT: Negative for congestion, mouth sores, rhinorrhea, sinus pressure, sore throat and trouble swallowing.    Eyes: Negative for photophobia, redness, itching and visual disturbance.   Respiratory: Negative for apnea, cough, chest tightness and shortness of breath.    Cardiovascular: Negative for chest pain, palpitations and leg swelling.   Gastrointestinal: Positive for abdominal pain ( intermittent, improving). Negative for abdominal distention, anal bleeding, bloating, blood in stool, constipation, diarrhea, nausea and vomiting.   Genitourinary: Negative for decreased urine volume, difficulty urinating, dysuria, frequency, genital sores, hematuria, pelvic pain, urgency, vaginal bleeding, vaginal discharge and vaginal pain.   Musculoskeletal: Negative for arthralgias, back pain, joint swelling and neck pain.   Skin: Negative for color change, pallor and wound.   Neurological: Negative for dizziness, seizures, weakness, numbness and headaches.   Hematological: Negative for adenopathy. Does not bruise/bleed easily.   Psychiatric/Behavioral: Negative for agitation, confusion and depression. The patient is not nervous/anxious.        Vitals  Blood pressure 136/66, pulse 85, temperature 98.5 ??F (36.9 ??C), temperature source Oral, height 5' 4 (1.626 m), weight 118 lb (53.5 kg), SpO2 100 %.    Physical Exam   Vitals reviewed.  Constitutional: She is oriented to person, place, and time. She appears well-developed and well-nourished.   HENT:   Head: Normocephalic and atraumatic.   Eyes: Conjunctivae and EOM are normal.   Neck: Normal range of motion. Neck supple. No thyromegaly present.   Cardiovascular: Normal rate, regular rhythm and normal heart sounds.    Pulmonary/Chest: Effort  normal and breath sounds normal. No respiratory distress. She has no wheezes. She has no rales.   Abdominal: Soft. Bowel sounds are normal. She exhibits no distension and no mass. There is no tenderness. There is no rebound and no guarding.   No masses or tenderness.  No hernia palpable.   No hepatosplenomegaly.     Prior scars from xlap, tummy tuck   Genitourinary:   Genitourinary Comments: Not examined per pt request   Musculoskeletal: Normal range of motion. She exhibits no edema.   Lymphadenopathy:     She has no cervical adenopathy.   Neurological: She is alert and oriented to person, place, and time.   Skin: Skin is warm and dry.        Psychiatric: She has a normal mood and affect. Her behavior is normal. Judgment and thought content normal.          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     Posterior cul de sac biopsy-metastatic adenocarcinoma.     Omentectomy- metastatic adenocarcinoma     LND-negative     Residual right IP ligament, Anterior cul de sac biopsy, right pelvic side will biopsy, left pelvic side wall, right gutter and left gutter-->benign    04/06/2017:   CT [name]- mild small bowel obstruction with transition point in the left pelvis adjacent to the anterior abdominal  wall. Low density in the common femoral veins bilaterally thought likely to represent flow artificat, may consider follow up with lomer extremity Doppler to ensure there is no underlying deep venous thrombosis.    01/29/2018:   CTAP:  Interval development of necrotic retroperitoneal lymphadenopathy with largest node measuring 4.9  4.2 x 4.8 cm. Origin is indeterminate.  Options would include CT directed biopsy as well as PET/CT.    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  No other focal areas of FD avid malignancy are found. Specifically, no lymph node activity is found outside of the retroperitoneum and there is no evidence for FDG avid pulmonary, skeletal or hepatic parenchymal metastasis.    Cancer Staging:  Cancer Staging  Malignant neoplasm of both ovaries (CMS Dx)  Staging form: Ovary, Fallopian Tube, And Primary Peritoneal Carcinoma, AJCC 8th Edition  - Clinical: FIGO Stage IIIC - Signed by Mikle Bosworth, MD on 04/01/2018      Disease Status: Systemic recurrence of disease (04/01/2018 12:43 PM)           Assessment & Plan  My impression is Ms. 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    We discussed recurrent ovarian cancer in detail and agree with the recommendation of Dr. Ishmael Holter for treatment platinum-based chemotherapy with Carboplatin  and Paclitaxel.  The patient is interested in dose-dense Paclitaxel and we will proceed in this manner.      She is s/p port placement on 04/14/18, site is healing well. She denies any new symptoms today. Pre-chemo labs drawn today. Genetic testing was collected results pending.   Previously OSH CTs from 01/29/18 and PET from 03/04/18 were uploaded to epic but  have not been read by a radiologist yet. Awaiting review.    Carroll Kinds Pickering  has recurrent PS EIC cancer and presents for cycle 2 of carbo/weekly taxol.  I have reviewed her labs and she is cleared chemotherapy on 05/07/18. There is no evidence of progressive disease or excessive toxicity today, and we will continue treatment without dose modification. We will see her back for her next cycle of treatment, and she knows to call if questions or problems arise. We have ordered nadir and pre-chemo labs and will continue monitoring in between cycles.  __Genetic testing results pending  __Chemo on fridays, PCE on thurs prior        Of note: she is instructed to use imodium with the start of diarrhea should she develop this side effect following chemotherapy. She is instructed to call if this is not helpful.        Pain  Pain Score: Five (05/06/2018  1:54 PM)     She reports pain is intermittent, at time of visit she denied pain.  Pain will be reassessed at next visit.           Tery Sanfilippo, CNP  Division of Gynecologic Oncology  [phone]        Medical Decision Making:  The following items were considered in medical decision making:  Review / order clinical lab tests  Review / order radiology tests  Review / order other diagnostic tests/interventions

## 2018-05-07 ENCOUNTER — Other Ambulatory Visit: Admit: 2018-05-07 | Payer: Medicare (Managed Care)

## 2018-05-07 ENCOUNTER — Ambulatory Visit: Admit: 2018-05-07 | Payer: Medicare (Managed Care)

## 2018-05-07 DIAGNOSIS — Z5111 Encounter for antineoplastic chemotherapy: Secondary | ICD-10-CM

## 2018-05-07 DIAGNOSIS — C563 Malignant neoplasm of bilateral ovaries (CMS-HCC): Secondary | ICD-10-CM

## 2018-05-07 LAB — CA 125: CA 125: 162.2 U/mL (ref 5.5–35.0)

## 2018-05-07 LAB — MAGNESIUM: Magnesium: 1.7 mg/dL (ref 1.5–2.5)

## 2018-05-07 MED ORDER — EPINEPHrine 1 mg/mL injection
1 | Freq: Every day | INTRAMUSCULAR | Status: AC | PRN
Start: 2018-05-07 — End: 2018-05-07

## 2018-05-07 MED ORDER — proCHLORPERazine (COMPAZINE) tablet 10 mg
10 | Freq: Four times a day (QID) | ORAL | Status: AC | PRN
Start: 2018-05-07 — End: 2018-05-07

## 2018-05-07 MED ORDER — heparin lock flush Syrg 500 Units
100 | Freq: Every day | INTRAVENOUS | Status: AC | PRN
Start: 2018-05-07 — End: 2018-05-07
  Administered 2018-05-07: 16:00:00 500 [IU]

## 2018-05-07 MED ORDER — diphenhydrAMINE (BENADRYL) capsule 25 mg
25 | Freq: Once | ORAL | Status: AC | PRN
Start: 2018-05-07 — End: 2018-05-07
  Administered 2018-05-07: 14:00:00 25 mg via ORAL

## 2018-05-07 MED ORDER — CARBOplatin (PARAPLATIN) 500 mg in dextrose 5% in water (D5W) 250 mL chemo infusion
Freq: Once | INTRAVENOUS | Status: AC
Start: 2018-05-07 — End: 2018-05-07
  Administered 2018-05-07: 15:00:00 500 mg via INTRAVENOUS

## 2018-05-07 MED ORDER — dexamethasone (DECADRON) tablet 12 mg
4 | Freq: Once | ORAL | Status: AC
Start: 2018-05-07 — End: 2018-05-07
  Administered 2018-05-07: 14:00:00 12 mg via ORAL

## 2018-05-07 MED ORDER — famotidine (PEPCID) tablet 20 mg
20 | Freq: Once | ORAL | Status: AC | PRN
Start: 2018-05-07 — End: 2018-05-07
  Administered 2018-05-07: 14:00:00 20 mg via ORAL

## 2018-05-07 MED ORDER — aprepitant (CINVANTI) IV emulsion 130 mg
7.2 | Freq: Once | INTRAVENOUS | Status: AC
Start: 2018-05-07 — End: 2018-05-07
  Administered 2018-05-07: 14:00:00 130 mg via INTRAVENOUS

## 2018-05-07 MED ORDER — diphenhydrAMINE (BENADRYL) injection 25 mg
50 | Freq: Once | INTRAMUSCULAR | Status: AC
Start: 2018-05-07 — End: 2018-05-07

## 2018-05-07 MED ORDER — sodium chloride flush 10 mL
Freq: Every day | INTRAMUSCULAR | Status: AC | PRN
Start: 2018-05-07 — End: 2018-05-07
  Administered 2018-05-07: 16:00:00 10 mL via INTRAVENOUS

## 2018-05-07 MED ORDER — palonosetron (ALOXI) injection 0.25 mg
0.25 | Freq: Once | INTRAVENOUS | Status: AC
Start: 2018-05-07 — End: 2018-05-07
  Administered 2018-05-07: 14:00:00 0.25 mg via INTRAVENOUS

## 2018-05-07 MED ORDER — diphenhydrAMINE (BENADRYL) injection 25 mg
50 | Freq: Every day | INTRAMUSCULAR | Status: AC | PRN
Start: 2018-05-07 — End: 2018-05-07

## 2018-05-07 MED ORDER — PACLitaxel (TAXOL) 120 mg in sodium chloride 0.9 % 250 mL chemo infusion
Freq: Once | INTRAVENOUS | Status: AC
Start: 2018-05-07 — End: 2018-05-07
  Administered 2018-05-07: 14:00:00 120 mg/m2 via INTRAVENOUS

## 2018-05-07 MED ORDER — famotidine (PF) (PEPCID) injection 20 mg
20 | Freq: Once | INTRAVENOUS | Status: AC
Start: 2018-05-07 — End: 2018-05-07

## 2018-05-07 MED ORDER — albuterol (PROVENTIL;VENTOLIN;PROAIR) inhaler 1-2 puff
90 | RESPIRATORY_TRACT | Status: AC | PRN
Start: 2018-05-07 — End: 2018-05-07

## 2018-05-07 MED ORDER — sodium chloride 0.9 % infusion
Freq: Every day | INTRAVENOUS | Status: AC | PRN
Start: 2018-05-07 — End: 2018-05-07
  Administered 2018-05-07: 14:00:00 25 mL/h via INTRAVENOUS

## 2018-05-07 MED ORDER — LORazepam (ATIVAN) tablet 0.5 mg
0.5 | Freq: Four times a day (QID) | ORAL | Status: AC | PRN
Start: 2018-05-07 — End: 2018-05-07

## 2018-05-07 MED ORDER — hydrocortisone sod succ (PF)(SOLU-CORTEF) 100 mg injection
100 | Freq: Every day | INTRAMUSCULAR | Status: AC | PRN
Start: 2018-05-07 — End: 2018-05-07

## 2018-05-07 MED FILL — DIPHENHYDRAMINE 25 MG CAPSULE: 25 25 mg | ORAL | Qty: 1

## 2018-05-07 MED FILL — CINVANTI 7.2 MG/ML INTRAVENOUS EMULSION: 7.2 7.2 mg/mL | INTRAVENOUS | Qty: 18

## 2018-05-07 MED FILL — PALONOSETRON 0.25 MG/5 ML INTRAVENOUS SOLUTION: 0.25 0.25 mg/5 mL | INTRAVENOUS | Qty: 5

## 2018-05-07 MED FILL — PACLITAXEL 6 MG/ML CONCENTRATE,INTRAVENOUS: 6 6 mg/mL | INTRAVENOUS | Qty: 20

## 2018-05-07 MED FILL — DEXAMETHASONE 4 MG TABLET: 4 4 MG | ORAL | Qty: 3

## 2018-05-07 MED FILL — CARBOPLATIN 10 MG/ML INTRAVENOUS SOLUTION: 10 10 mg/mL | INTRAVENOUS | Qty: 50

## 2018-05-07 MED FILL — FAMOTIDINE 20 MG TABLET: 20 20 MG | ORAL | Qty: 1

## 2018-05-07 NOTE — Nursing Note (Addendum)
Kelsey Sullivan here today for C2D1 of Taxol/Carbo. Informed consent verified. Labs verified to be in normal range for ordered treatment. Pt arrived with port having been accessed in port room; positive blood return. Pt not a fall risk on assessment. See flow sheets for vital signs. Pt tolerated treatment without difficulty. Port flushed with NS 10ml and 5 ml Heparin 100 units/ml. Positive blood return noted. Discharged ambulatory. Pt given AVS.

## 2018-05-07 NOTE — Unmapped (Signed)
Labs drawn from implanted venous access device.

## 2018-05-14 ENCOUNTER — Ambulatory Visit: Admit: 2018-05-14 | Payer: Medicare (Managed Care)

## 2018-05-14 ENCOUNTER — Other Ambulatory Visit: Admit: 2018-05-14 | Payer: Medicare (Managed Care)

## 2018-05-14 DIAGNOSIS — Z5111 Encounter for antineoplastic chemotherapy: Secondary | ICD-10-CM

## 2018-05-14 DIAGNOSIS — C563 Malignant neoplasm of bilateral ovaries (CMS-HCC): Secondary | ICD-10-CM

## 2018-05-14 LAB — DIFFERENTIAL
Basophils Absolute: 25 /uL (ref 0–200)
Basophils Relative: 0.7 % (ref 0.0–1.0)
Eosinophils Absolute: 65 /uL (ref 15–500)
Eosinophils Relative: 1.8 % (ref 0.0–8.0)
Lymphocytes Absolute: 1249 /uL (ref 850–3900)
Lymphocytes Relative: 34.7 % (ref 15.0–45.0)
Monocytes Absolute: 238 /uL (ref 200–950)
Monocytes Relative: 6.6 % (ref 0.0–12.0)
Neutrophils Absolute: 2023 /uL (ref 1500–7800)
Neutrophils Relative: 56.2 % (ref 40.0–80.0)
nRBC: 0 /100{WBCs} (ref 0–0)

## 2018-05-14 LAB — CBC
Hematocrit: 29.7 % (ref 35.0–45.0)
Hemoglobin: 10.5 g/dL (ref 11.7–15.5)
MCH: 34.6 pg (ref 27.0–33.0)
MCHC: 35.3 g/dL (ref 32.0–36.0)
MCV: 98.1 fL (ref 80.0–100.0)
MPV: 7.1 fL (ref 7.5–11.5)
Platelets: 225 10*3/uL (ref 140–400)
RBC: 3.03 10*6/uL (ref 3.80–5.10)
RDW: 14.2 % (ref 11.0–15.0)
WBC: 3.6 10*3/uL (ref 3.8–10.8)

## 2018-05-14 LAB — COMPREHENSIVE METABOLIC PANEL
ALT: 18 U/L (ref 7–52)
AST: 20 U/L (ref 13–39)
Albumin: 4.1 g/dL (ref 3.5–5.7)
Alkaline Phosphatase: 69 U/L (ref 36–125)
Anion Gap: 6 mmol/L (ref 3–16)
BUN: 20 mg/dL (ref 7–25)
CO2: 28 mmol/L (ref 21–33)
Calcium: 9.4 mg/dL (ref 8.6–10.3)
Chloride: 99 mmol/L (ref 98–110)
Creatinine: 0.59 mg/dL — ABNORMAL LOW (ref 0.60–1.30)
Glucose: 121 mg/dL — ABNORMAL HIGH (ref 70–100)
Osmolality, Calculated: 280 mosm/kg (ref 278–305)
Potassium: 4.3 mmol/L (ref 3.5–5.3)
Sodium: 133 mmol/L (ref 133–146)
Total Bilirubin: 0.4 mg/dL (ref 0.0–1.5)
Total Protein: 6.7 g/dL (ref 6.4–8.9)
eGFR AA CKD-EPI: >90 See note.
eGFR NONAA CKD-EPI: 89 See note.

## 2018-05-14 LAB — MAGNESIUM: Magnesium: 1.8 mg/dL (ref 1.5–2.5)

## 2018-05-14 MED ORDER — heparin lock flush Syrg 500 Units
100 | Freq: Every day | INTRAVENOUS | Status: AC | PRN
Start: 2018-05-14 — End: 2018-05-14
  Administered 2018-05-14: 16:00:00 500 [IU]

## 2018-05-14 MED ORDER — albuterolPROVENTILVENTOLINPROAIRinhaler12puff
90 | RESPIRATORY_TRACT | Status: AC | PRN
Start: 2018-05-14 — End: 2018-05-14

## 2018-05-14 MED ORDER — famotidine (PF) (PEPCID) injection 20 mg
20 | Freq: Once | INTRAVENOUS | Status: AC
Start: 2018-05-14 — End: 2018-05-14

## 2018-05-14 MED ORDER — PACLitaxel (TAXOL) 120 mg in sodium chloride 0.9 % 250 mL chemo infusion
6 | Freq: Once | INTRAVENOUS | Status: AC
Start: 2018-05-14 — End: 2018-05-14
  Administered 2018-05-14: 15:00:00 120 mg/m2 via INTRAVENOUS

## 2018-05-14 MED ORDER — diphenhydrAMINE (BENADRYL) capsule 25 mg
25 | Freq: Once | ORAL | Status: AC | PRN
Start: 2018-05-14 — End: 2018-05-14
  Administered 2018-05-14: 14:00:00 25 mg via ORAL

## 2018-05-14 MED ORDER — LORazepam (ATIVAN) tablet 0.5 mg
0.5 | Freq: Four times a day (QID) | ORAL | Status: AC | PRN
Start: 2018-05-14 — End: 2018-05-14

## 2018-05-14 MED ORDER — EPINEPHrine 1 mg/mL injection
1 | Freq: Every day | INTRAMUSCULAR | Status: AC | PRN
Start: 2018-05-14 — End: 2018-05-14

## 2018-05-14 MED ORDER — famotidine (PEPCID) tablet 20 mg
20 | Freq: Once | ORAL | Status: AC | PRN
Start: 2018-05-14 — End: 2018-05-14
  Administered 2018-05-14: 14:00:00 20 mg via ORAL

## 2018-05-14 MED ORDER — diphenhydrAMINE (BENADRYL) injection 25 mg
50 | Freq: Once | INTRAMUSCULAR | Status: AC
Start: 2018-05-14 — End: 2018-05-14

## 2018-05-14 MED ORDER — diphenhydrAMINE (BENADRYL) injection 25 mg
50 | Freq: Every day | INTRAMUSCULAR | Status: AC | PRN
Start: 2018-05-14 — End: 2018-05-14

## 2018-05-14 MED ORDER — sodium chloride flush 10 mL
Freq: Every day | INTRAMUSCULAR | Status: AC | PRN
Start: 2018-05-14 — End: 2018-05-14
  Administered 2018-05-14: 16:00:00 10 mL via INTRAVENOUS

## 2018-05-14 MED ORDER — hydrocortisone sod succ (PF)(SOLU-CORTEF) 100 mg injection
100 | Freq: Every day | INTRAMUSCULAR | Status: AC | PRN
Start: 2018-05-14 — End: 2018-05-14

## 2018-05-14 MED ORDER — dexamethasone (DECADRON) tablet 4 mg
4 | Freq: Once | ORAL | Status: AC
Start: 2018-05-14 — End: 2018-05-14
  Administered 2018-05-14: 14:00:00 4 mg via ORAL

## 2018-05-14 MED ORDER — sodium chloride 0.9 % infusion
Freq: Every day | INTRAVENOUS | Status: AC | PRN
Start: 2018-05-14 — End: 2018-05-14
  Administered 2018-05-14: 14:00:00 25 mL/h via INTRAVENOUS

## 2018-05-14 MED FILL — FAMOTIDINE 20 MG TABLET: 20 20 MG | ORAL | Qty: 1

## 2018-05-14 MED FILL — DIPHENHYDRAMINE 25 MG CAPSULE: 25 25 mg | ORAL | Qty: 1

## 2018-05-14 MED FILL — PACLITAXEL 6 MG/ML CONCENTRATE,INTRAVENOUS: 6 6 mg/mL | INTRAVENOUS | Qty: 20

## 2018-05-14 MED FILL — DEXAMETHASONE 4 MG TABLET: 4 4 MG | ORAL | Qty: 1

## 2018-05-14 NOTE — Unmapped (Signed)
0900 Port was accessed per protocol. Labs were obtained as ordered.

## 2018-05-14 NOTE — Nursing Note (Addendum)
Kelsey Sullivan here for Day 8 / Cycle 2 Taxol infusion. Pt arrived with port accessed from port room, site WNL, dressing dry and intact, positive blood return. Pt arrives with her dtr Kelsey Sullivan assessment completed, pt is low risk, oriented to room and call light given.  Labs reviewed.  Pt states she's had trouble sleeping/ staying asleep after her Day 1 of Taxol / Carboplatin. Pt takes Decadron 8mg  Day 2-4 after chemotherapy. Pt was prescribed Ambien 5 mg, to take 0.5mg  dose at HS. Pt states she only slept 4 hrs at a time at most and tried taking up to a 10mg  dose one night. Pt has 3 of the 7 Ambiens left.  Pt also tried taking Tylenol PM.  Pt slept best last night.  Discussed pt's lack of sleep with B. Hesse, CNP and L. Grate, pharmD. Moving forward, on Day 1 of treatments where pt receiving both Taxol / Carboplatin, pt will take 4mg  Decadron on Day 2, 3, and 4 following treatment instead of 8mg .  If patient still is having issues with sleep and doesn't have any nausea will delete the post chemo Decadron. Pt verbalized understanding.  Pt's dtr expressing frustration with the unit policy of 1 visitor back in treatment suite at one time.  Apparently, there has been some inconsistency and wishes to see a policy; if not available at this treatment, then at next visit. She also voiced a concern about how the policy / rule was presented as fas a customer service point of view.    Kelsey Flock, RN infusion manager notified; will speak to pt's dtr at next treatment.  Chemotherapy completed without incident.  Port flushed with 0.9% saline 10 mls and Heparin : 100 units per ml.  AVS given and reviewed.  Pt discharged with friend at side.

## 2018-05-18 NOTE — Telephone Encounter (Signed)
Patient called and spoke with her, she stated that she is having abdominal discomfort, that she can literally feel her stool moving through her bowels.  That she is having regular bowel movements daily and sometimes has multiple bowel movements in one day.  She is not taking anything for bowels like miralax etc.  Explained to the patient that the chemotherapy does seem to affect our bowels, but at this time her symptoms don't seem emergent but to monitor and to call back if it worsens, and to mention at her next visit with Dr. [name].

## 2018-05-20 ENCOUNTER — Other Ambulatory Visit: Admit: 2018-05-20 | Payer: Medicare (Managed Care)

## 2018-05-20 ENCOUNTER — Ambulatory Visit: Admit: 2018-05-20 | Discharge: 2018-05-20 | Payer: Medicare (Managed Care)

## 2018-05-20 ENCOUNTER — Ambulatory Visit: Admit: 2018-05-20 | Payer: Medicare (Managed Care)

## 2018-05-20 DIAGNOSIS — Z5111 Encounter for antineoplastic chemotherapy: Secondary | ICD-10-CM

## 2018-05-20 DIAGNOSIS — C563 Malignant neoplasm of bilateral ovaries (CMS-HCC): Secondary | ICD-10-CM

## 2018-05-20 LAB — COMPREHENSIVE METABOLIC PANEL
ALT: 18 U/L (ref 7–52)
AST: 23 U/L (ref 13–39)
Albumin: 4 g/dL (ref 3.5–5.7)
Alkaline Phosphatase: 65 U/L (ref 36–125)
Anion Gap: 8 mmol/L (ref 3–16)
BUN: 19 mg/dL (ref 7–25)
CO2: 26 mmol/L (ref 21–33)
Calcium: 9.5 mg/dL (ref 8.6–10.3)
Chloride: 99 mmol/L (ref 98–110)
Creatinine: 0.56 mg/dL (ref 0.60–1.30)
Glucose: 168 mg/dL (ref 70–100)
Osmolality, Calculated: 282 mOsm/kg (ref 278–305)
Potassium: 4.4 mmol/L (ref 3.5–5.3)
Sodium: 133 mmol/L (ref 133–146)
Total Bilirubin: 0.4 mg/dL (ref 0.0–1.5)
Total Protein: 6.6 g/dL (ref 6.4–8.9)
eGFR AA CKD-EPI: >90 See note.
eGFR NONAA CKD-EPI: >90 See note.

## 2018-05-20 LAB — DIFFERENTIAL
Basophils Absolute: 22 /uL (ref 0–200)
Basophils Relative: 0.8 % (ref 0.0–1.0)
Eosinophils Absolute: 11 /uL (ref 15–500)
Eosinophils Relative: 0.4 % (ref 0.0–8.0)
Lymphocytes Absolute: 956 /uL (ref 850–3900)
Lymphocytes Relative: 35.4 % (ref 15.0–45.0)
Monocytes Absolute: 173 /uL (ref 200–950)
Monocytes Relative: 6.4 % (ref 0.0–12.0)
Neutrophils Absolute: 1539 /uL (ref 1500–7800)
Neutrophils Relative: 57 % (ref 40.0–80.0)
nRBC: 0 /100 WBC (ref 0–0)

## 2018-05-20 LAB — CBC
Hematocrit: 25.6 % (ref 35.0–45.0)
Hemoglobin: 9.2 g/dL (ref 11.7–15.5)
MCH: 35.2 pg (ref 27.0–33.0)
MCHC: 35.8 g/dL (ref 32.0–36.0)
MCV: 98.3 fL (ref 80.0–100.0)
MPV: 7.4 fL (ref 7.5–11.5)
Platelets: 225 10*3/uL (ref 140–400)
RBC: 2.6 10*6/uL (ref 3.80–5.10)
RDW: 15 % (ref 11.0–15.0)
WBC: 2.7 10*3/uL (ref 3.8–10.8)

## 2018-05-20 LAB — MAGNESIUM: Magnesium: 1.8 mg/dL (ref 1.5–2.5)

## 2018-05-20 MED ORDER — PACLitaxel (TAXOL) 120 mg in sodium chloride 0.9 % 250 mL chemo infusion
Freq: Once | INTRAVENOUS | Status: AC
Start: 2018-05-20 — End: 2018-05-20
  Administered 2018-05-20: 15:00:00 120 mg/m2 via INTRAVENOUS

## 2018-05-20 MED ORDER — famotidine (PF) (PEPCID) injection 20 mg
20 | Freq: Once | INTRAVENOUS | Status: AC
Start: 2018-05-20 — End: 2018-05-20

## 2018-05-20 MED ORDER — dexAMETHasone (DECADRON) tablet 4 mg
4 | Freq: Once | ORAL | Status: AC
Start: 2018-05-20 — End: 2018-05-20
  Administered 2018-05-20: 15:00:00 4 mg via ORAL

## 2018-05-20 MED ORDER — sodium chloride 0.9 % infusion
Freq: Every day | INTRAVENOUS | Status: AC | PRN
Start: 2018-05-20 — End: 2018-05-20
  Administered 2018-05-20: 15:00:00 25 mL/h via INTRAVENOUS

## 2018-05-20 MED ORDER — heparin lock flush Syrg 500 Units
100 | Freq: Every day | INTRAVENOUS | Status: AC | PRN
Start: 2018-05-20 — End: 2018-05-20
  Administered 2018-05-20: 16:00:00 500 [IU]

## 2018-05-20 MED ORDER — diphenhydrAMINE (BENADRYL) capsule 25 mg
25 | Freq: Once | ORAL | Status: AC | PRN
Start: 2018-05-20 — End: 2018-05-20
  Administered 2018-05-20: 15:00:00 25 mg via ORAL

## 2018-05-20 MED ORDER — LORazepam (ATIVAN) tablet 0.5 mg
0.5 | Freq: Four times a day (QID) | ORAL | Status: AC | PRN
Start: 2018-05-20 — End: 2018-05-20

## 2018-05-20 MED ORDER — sodium chloride flush 10 mL
Freq: Every day | INTRAMUSCULAR | Status: AC | PRN
Start: 2018-05-20 — End: 2018-05-20
  Administered 2018-05-20: 16:00:00 10 mL via INTRAVENOUS

## 2018-05-20 MED ORDER — diphenhydrAMINE (BENADRYL) injection 25 mg
50 | Freq: Once | INTRAMUSCULAR | Status: AC
Start: 2018-05-20 — End: 2018-05-20

## 2018-05-20 MED ORDER — albuterol (PROVENTIL;VENTOLIN;PROAIR) inhaler 1-2 puff
90 | RESPIRATORY_TRACT | Status: AC | PRN
Start: 2018-05-20 — End: 2018-05-20

## 2018-05-20 MED ORDER — hydrocortisone sod succ (PF)(SOLU-CORTEF) 100 mg injection
100 | Freq: Every day | INTRAMUSCULAR | Status: AC | PRN
Start: 2018-05-20 — End: 2018-05-20

## 2018-05-20 MED ORDER — famotidine (PEPCID) tablet 20 mg
20 | Freq: Once | ORAL | Status: AC | PRN
Start: 2018-05-20 — End: 2018-05-20
  Administered 2018-05-20: 15:00:00 20 mg via ORAL

## 2018-05-20 MED ORDER — EPINEPHrine 1 mg/mL injection
1 | Freq: Every day | INTRAMUSCULAR | Status: AC | PRN
Start: 2018-05-20 — End: 2018-05-20

## 2018-05-20 MED ORDER — diphenhydrAMINE (BENADRYL) injection 25 mg
50 | Freq: Every day | INTRAMUSCULAR | Status: AC | PRN
Start: 2018-05-20 — End: 2018-05-20

## 2018-05-20 MED FILL — DEXAMETHASONE 4 MG TABLET: 4 4 MG | ORAL | Qty: 1

## 2018-05-20 MED FILL — DIPHENHYDRAMINE 25 MG CAPSULE: 25 25 mg | ORAL | Qty: 1

## 2018-05-20 MED FILL — FAMOTIDINE 20 MG TABLET: 20 20 MG | ORAL | Qty: 1

## 2018-05-20 MED FILL — PACLITAXEL 6 MG/ML CONCENTRATE,INTRAVENOUS: 6 6 mg/mL | INTRAVENOUS | Qty: 20

## 2018-05-20 NOTE — Unmapped (Signed)
09:47 am - Port accessed per protocol without difficulty, positive blood return noted. Labs drawn as ordered. See flow sheet for details.

## 2018-05-20 NOTE — Progress Notes (Signed)
History of Present Illness  Kelsey Sullivan is a 77 y.o. 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 She declined adjuvant therapy. She used natural methods, went to a raw diet for a year, etc.  She developed a small bowel obstruction in 03/2017, resolved on its own, with NED at that time.  In 01/2018, she experienced mid abdominal pain, and seen by PCP and ED, with CT of abdomen and pelvis showing interval development of necrotic retroperitoneal lymphadenopathy with largest node measuring 4.9  4.2 x 4.8 cm. Her CA 125 on 02/18/18 was 197.8.  She underwent a ct-guided nodal biopsy on 02/26/18, with pathology confirming recurrent disease.  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 She was seen at [facility] cancer in [place] with Dr. Ishmael Holter and was recommended to undergo chemotherapy with Carboplatin and Paclitaxel-weekly.  The patient is interested in dose-dense Paclitaxel and we will proceed in this manner. Foundation 1 testing was also sent.     She lives in [place] half of the time, and [place] half of the time and has decided to pursue therapy in [place] given more family support.       Kelsey Sullivan is doing well. She previously called in for abdominal pain after chemotherapy which has  now resolved. States she has gained 2 lbs, states she is eating more sherbet. She states she is doing well, has noted to have more insomnia after chemotherapy cycles but is resolved with benadryl. She denies any bloating or fullness.  She reports normal bowel/bladder habits.   She denies any GU concerns. Denies any dysuria, frequency, hematuria, flank pain or fevers.  She denies any vaginal bleeding or discharge.   She denies neuropathy.   She reports no leg swelling.   Her appetite is good, and reports no nausea or vomiting. 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 Pt declined adjuvant therapy      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       L ovary and fallopian tube- serous  adenocarcinoma, high grade, 4 cm, favor metastasis.     Posterior cul de sac biopsy-metastatic adenocarcinoma.     Omentectomy- metastatic adenocarcinoma     LND-negative     Residual right IP ligament, Anterior cul de sac biopsy, right pelvic side will biopsy, left pelvic side wall, right gutter and left gutter-->benign    04/06/2017:   CT [name]- mild small bowel obstruction with transition point in the left pelvis adjacent to the anterior abdominal wall. Low density in the common femoral veins bilaterally thought likely to represent flow artificat, may consider follow up with lomer extremity Doppler to ensure there is no underlying deep venous  thrombosis.    01/29/2018:   CTAP:  Interval development of necrotic retroperitoneal lymphadenopathy with largest node measuring 4.9  4.2 x 4.8 cm. Origin is indeterminate.  Options would include CT directed biopsy as well as PET/CT.    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  No other focal areas of FD avid malignancy are found. Specifically, no lymph node activity is found outside of the retroperitoneum and there is no evidence for FDG avid pulmonary, skeletal or hepatic parenchymal metastasis.    02/2018:   Patient saw Dr. Ishmael Holter who recommended Carbo/Taxol with an AUC of 6.  Dr. Maren Reamer office submitted foundation one testing. Patient would like Genetic Testing    04/12/2018:   IR port placed    04/16/2018:   C1 weekly taxol 80 mg/m2 D1,8,15, Carboplatin AUC 6 D1 q 21 days. PCEs on thurs, txt on fridays. EXAMS ON EVEN CYCLES     CA 125: 523.4     Genetic testing     05/07/2018:   C2 weekly taxol 80 mg/m2 D1,8,15, Carboplatin AUC 6 D1 q 21 days. PCEs on thurs, txt on fridays. EXAMS ON EVEN CYCLES     CA 125: 162.2    05/20/2018:   Early PCE due to scheduling, due 6/21    05/28/2018  C3 weekly taxol 80 mg/m2 D1,8,15, Carboplatin AUC 6 D1 q 21 days. EXAMS ON EVEN CYCLES.      CA 125    06/17/2018:   C4 weekly taxol 80 mg/m2 D1,8,15, Carboplatin AUC 6 D1 q 21 days. EXAMS ON EVEN CYCLES. D1 Thurs 7/11, D8 F 7/19, D15 F 7/26, C5 + PCE 07/09/18     CA 125    Disposition: chemo, imaging after 3 cucles, CA 125, possible parp maintenance after, genetic testing pending, foundation one testing  results from [facility] in [place]  Current disease status:   Genetics:  Survivorship plan: pending completion of treatment              Past Medical History  She  has a past medical history of Bowel obstruction (CMS Dx) (2018) and Ovarian cancer (CMS Dx) (2014).  Past Medical History:   Diagnosis Date    Bowel obstruction (CMS Dx) 2018    Ovarian cancer (CMS Dx) 2014       Past Surgical History  Past Surgical History:   Procedure Laterality Date    ABDOMINOPLASTY  1999  BLEPHAROPLASTY  2000    DILATION AND CURETTAGE OF UTERUS  1979    ELBOW SURGERY  1990    EYE SURGERY  1994    KNEE ARTHROPLASTY Right     ROTATOR CUFF REPAIR      TRANSUMBILICAL AUGMENTATION MAMMAPLASTY      VAGINAL HYSTERECTOMY  1979       Family History  History reviewed. No pertinent family history.    Family cancer history for ovarian, uterine and colon cancer is negative other than above.       Social History  Social History     Social History    Marital status: Divorced     Spouse name: N/A    Number of children: N/A    Years of education: N/A     Social History Main Topics    Smoking status: Former Smoker    Smokeless tobacco: Never Used    Alcohol use Yes      Comment: 2-3 drinks a day    Drug use: No    Sexual activity: Not Asked     Other Topics Concern    Caffeine Use No    Occupational Exposure No    Exercise Yes    Seat Belt Yes     Social History Narrative    Mammogram-6 years ago and it was normal. Pt stated she isn't getting them anymore.      Colonoscopy-10 years ago        Pt reports she can lie flat in bed without SOB.    Pt states she can walk a flight of stairs and city block without chest pain and SOB>         Past OB/GYN History  G3P2  She reports menarche at age 65 and menopause at age 72-surgical.  She denies a history of STIs.  She denies a history of abnormal cervical cytology and reports her last cytologic examination she is unsure of. She has taken HRT.     Health maintenance:  Mammogram: Date 6 years ago  Results normal  Colonoscopy: Date 10 years ago Results normal    Allergies  She is allergic to bactrim [sulfamethoxazole-trimethoprim]; nsaids (non-steroidal anti-inflammatory drug); celecoxib; and metoclopramide.    BMI  Body mass index is 20.6 kg/m.    Disease Status: Systemic recurrence of disease (04/01/2018 12:43 PM)          Histories  She has a past medical history of Bowel obstruction (CMS Dx) (2018) and Ovarian cancer (CMS Dx) (2014).    She has a past surgical history that includes Dilation and curettage of uterus (1979); Vaginal hysterectomy (1979); Elbow surgery (1990); Eye surgery (1994); Abdominoplasty (1999); Blepharoplasty (2000); Transumbilical augmentation mammaplasty; Rotator cuff repair; and Knee Arthroplasty (Right).    Her family history is not on file.    She reports that she has quit smoking. She has never used smokeless tobacco. She reports that she drinks alcohol. She reports that she does not use drugs.    Allergies  Bactrim [sulfamethoxazole-trimethoprim]; Nsaids (non-steroidal anti-inflammatory drug); Celecoxib; and Metoclopramide    Medications  Outpatient Encounter Prescriptions as of 05/20/2018   Medication Sig Dispense Refill    dexamethasone (DECADRON) 4 MG tablet Take 8 mg by mouth daily with food on day 2, 3, and 4 of chemotherapy cycle 18 tablet 6    digestive enzymes Cap Take by mouth.      ergocalciferol, vitamin D2, (VITAMIN D ORAL) Take 10,000 Units by mouth daily.  ibuprofen (ADVIL,MOTRIN) 200 MG tablet Take 200-800 mg by mouth every 8 hours as needed for Pain.      ibuprofen-diphenhydramine HCl 200-25 mg Cap Take 1 tablet by mouth at bedtime as needed.      Lactobac no.41/Bifidobact no.7 (PROBIOTIC-10 ORAL) Take 1 capsule by mouth daily. [employer] brand      lidocaine-prilocaine (EMLA) cream Apply topically daily as needed. Apply pea-size amount to port site 30-60 min prior to accessing port 30 g 1    omeprazole (PRILOSEC) 20 MG capsule Take 20 mg by  mouth daily.             ondansetron (ZOFRAN) 4 MG tablet Take 1 tablet (4 mg total) by mouth every 8 hours as needed for Nausea. Must wait to take until 5 days after receiving Aloxi 20 tablet 2    proCHLORPERazine (COMPAZINE) 10 MG tablet Take 1 tablet (10 mg total) by mouth every 6 hours as needed (For nausea and vomiting). 30 tablet 3    vit b complex w-b 12 (B COMPLEX-VITAMIN B12) tablet Take 1 tablet by mouth daily.      vitamin E 100 UNIT capsule Take 100 Units by mouth daily.       No facility-administered encounter medications on file as of 05/20/2018.         Review of Systems   Constitutional: Negative for activity change, appetite change, chills, fatigue, fever, weight gain and weight loss.   HENT: Negative for congestion, mouth sores, rhinorrhea, sinus pressure, sore throat and trouble swallowing.    Eyes: Negative for photophobia, redness, itching and visual disturbance.   Respiratory: Negative for apnea, cough, chest tightness and shortness of breath.    Cardiovascular: Negative for chest pain, palpitations and leg swelling.   Gastrointestinal: Negative for abdominal distention, abdominal pain ( intermittent, improving), anal bleeding, bloating, blood in stool, constipation, diarrhea, nausea and vomiting.   Genitourinary: Negative for decreased urine volume, difficulty urinating, dysuria, frequency, genital sores, hematuria, pelvic pain, urgency, vaginal bleeding, vaginal discharge and vaginal pain.   Musculoskeletal: Negative for arthralgias, back pain, joint swelling and neck pain.   Skin: Negative for color change, pallor and wound.   Neurological: Negative for dizziness, seizures, weakness, numbness and headaches.   Hematological: Negative for adenopathy. Does not bruise/bleed easily.   Psychiatric/Behavioral: Negative for agitation, confusion and depression. The patient is not nervous/anxious.        Vitals  Blood pressure 114/68, pulse 82, temperature 97.4 F (36.3 C), temperature source  Temporal, resp. rate 16, height 5' 4 (1.626 m), weight 120 lb (54.4 kg), SpO2 100 %.    Physical Exam   Vitals reviewed.  Constitutional: She is oriented to person, place, and time. She appears well-developed and well-nourished.   HENT:   Head: Normocephalic and atraumatic.   Eyes: Conjunctivae and EOM are normal.   Neck: Normal range of motion. Neck supple. No thyromegaly present.   Cardiovascular: Normal rate, regular rhythm and normal heart sounds.    Pulmonary/Chest: Effort normal and breath sounds normal. No respiratory distress. She has no wheezes. She has no rales.   Abdominal: Soft. Bowel sounds are normal. She exhibits no distension and no mass. There is no tenderness. There is no rebound and no guarding.   No masses or tenderness.  No hernia palpable.   No hepatosplenomegaly.     Prior scars from xlap, tummy tuck   Genitourinary:   Genitourinary Comments: EXAMS ON EVEN CYCLES   Musculoskeletal: Normal range of motion.  She exhibits no edema.   Lymphadenopathy:     She has no cervical adenopathy.   Neurological: She is alert and oriented to person, place, and time.   Skin: Skin is warm and dry.        Psychiatric: She has a normal mood and affect. Her behavior is normal. Judgment and thought content normal.          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       L ovary and  fallopian tube- serous adenocarcinoma, high grade, 4 cm, favor metastasis.     Posterior cul de sac biopsy-metastatic adenocarcinoma.     Omentectomy- metastatic adenocarcinoma     LND-negative     Residual right IP ligament, Anterior cul de sac biopsy, right pelvic side will biopsy, left pelvic side wall, right gutter and left gutter-->benign    04/06/2017:   CT [name]- mild small bowel obstruction with transition point in the left pelvis adjacent to the anterior abdominal wall. Low density in the common femoral veins bilaterally thought likely to represent flow artificat, may consider follow up with lomer extremity Doppler to ensure there is no underlying deep venous thrombosis.    01/29/2018:   CTAP:  Interval development of necrotic retroperitoneal lymphadenopathy with largest node measuring 4.9  4.2 x 4.8 cm. Origin is indeterminate.  Options would include CT directed biopsy as well as PET/CT.    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  No other focal areas of FD avid malignancy are found. Specifically, no lymph node activity is found outside of the retroperitoneum and there is no evidence for FDG avid pulmonary, skeletal or hepatic parenchymal metastasis.    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    We discussed recurrent ovarian cancer in detail and agree with the recommendation of Dr. Ishmael Holter for treatment platinum-based chemotherapy with Carboplatin and Paclitaxel.  The patient is interested in dose-dense Paclitaxel and we will proceed in this manner.      Today she is C2D15 of carbo/weekly taxol and presents for pre-chemo appointment for C3 of carbo/weekly taxol next week. She will also have her weekly taxol today.  She is tolerating chemotherapy appropriately well, and has experienced expected but manageable side effects. I have reviewed her labs and she is cleared chemotherapy. There is no evidence of progressive disease or excessive toxicity today, and we will continue treatment without dose modification. We will see her back for her next cycle of treatment, and she knows to call if questions or problems arise. We have ordered nadir and pre-chemo labs and will continue monitoring in between cycles.      All questions were answered. She is doing well, no complaints, her labs are appropriate for continued chemotherapy. She will have a pelvic exam for her C4 pre-chemo appointment.     Genetics:  Today we discussed her negative genetic testing results.    Pain  Pain Score: Zero (05/20/2018  9:38 AM)  Pain Loc: ABDOMEN (05/06/2018  1:54 PM)  She reports pain is intermittent, at time of visit she denied pain.  Pain will be reassessed at next visit.           I have spent 25 minutes face to face with this patient with greater than 50% of this time spent in counseling and coordination of care discussing the above    Medical Decision Making:  The following items were considered in medical decision  making:  Review / order clinical lab tests  Review / order radiology tests  Review / order other diagnostic tests/interventions      Maggie Font, MD, [facility]  Assistant Professor, Obstetrics and Gynecology  Division of Gynecologic Oncology  [phone]

## 2018-05-20 NOTE — Nursing Note (Addendum)
Kelsey Sullivan here today after MD appointment for C2D15  Of Taxol/Carbo. Informed consent verified. Labs verified to be in normal range for ordered treatment. Pt arrived with port having been accessed in port room; positive blood return. Pt not a fall risk on assessment. See MD visit note for Vitals, Allergy and Medication review. Pt tolerated treatment without difficulty. Discharged ambulatory. Pt given AVS.

## 2018-05-28 ENCOUNTER — Ambulatory Visit: Admit: 2018-05-28 | Payer: Medicare (Managed Care)

## 2018-05-28 ENCOUNTER — Other Ambulatory Visit: Admit: 2018-05-28 | Payer: Medicare (Managed Care)

## 2018-05-28 DIAGNOSIS — C561 Malignant neoplasm of right ovary: Secondary | ICD-10-CM

## 2018-05-28 DIAGNOSIS — C563 Malignant neoplasm of bilateral ovaries (CMS-HCC): Secondary | ICD-10-CM

## 2018-05-28 LAB — MAGNESIUM: Magnesium: 1.8 mg/dL (ref 1.5–2.5)

## 2018-05-28 LAB — DIFFERENTIAL
Basophils Absolute: 14 /uL (ref 0–200)
Basophils Relative: 0.5 % (ref 0.0–1.0)
Eosinophils Absolute: 14 /uL (ref 15–500)
Eosinophils Relative: 0.5 % (ref 0.0–8.0)
Lymphocytes Absolute: 1165 /uL (ref 850–3900)
Lymphocytes Relative: 41.6 % (ref 15.0–45.0)
Monocytes Absolute: 274 /uL (ref 200–950)
Monocytes Relative: 9.8 % (ref 0.0–12.0)
Neutrophils Absolute: 1333 /uL (ref 1500–7800)
Neutrophils Relative: 47.6 % (ref 40.0–80.0)
nRBC: 0 /100 WBC (ref 0–0)

## 2018-05-28 LAB — COMPREHENSIVE METABOLIC PANEL
ALT: 12 U/L (ref 7–52)
AST: 18 U/L (ref 13–39)
Albumin: 4.1 g/dL (ref 3.5–5.7)
Alkaline Phosphatase: 66 U/L (ref 36–125)
Anion Gap: 7 mmol/L (ref 3–16)
BUN: 17 mg/dL (ref 7–25)
CO2: 26 mmol/L (ref 21–33)
Calcium: 9.3 mg/dL (ref 8.6–10.3)
Chloride: 101 mmol/L (ref 98–110)
Creatinine: 0.6 mg/dL (ref 0.60–1.30)
Glucose: 129 mg/dL (ref 70–100)
Osmolality, Calculated: 281 mOsm/kg (ref 278–305)
Potassium: 4.3 mmol/L (ref 3.5–5.3)
Sodium: 134 mmol/L (ref 133–146)
Total Bilirubin: 0.3 mg/dL (ref 0.0–1.5)
Total Protein: 6.5 g/dL (ref 6.4–8.9)
eGFR AA CKD-EPI: >90 See note.
eGFR NONAA CKD-EPI: 89 See note.

## 2018-05-28 LAB — CBC
Hematocrit: 25.8 % (ref 35.0–45.0)
Hemoglobin: 9.1 g/dL (ref 11.7–15.5)
MCH: 35.5 pg (ref 27.0–33.0)
MCHC: 35.4 g/dL (ref 32.0–36.0)
MCV: 100.3 fL (ref 80.0–100.0)
MPV: 7.2 fL (ref 7.5–11.5)
Platelets: 182 10*3/uL (ref 140–400)
RBC: 2.57 10*6/uL (ref 3.80–5.10)
RDW: 16.3 % (ref 11.0–15.0)
WBC: 2.8 10*3/uL (ref 3.8–10.8)

## 2018-05-28 LAB — CA 125: CA 125: 25 U/mL (ref 5.5–35.0)

## 2018-05-28 NOTE — Unmapped (Signed)
09:00 am - Port accessed per protocol without difficulty, positive blood return noted. Labs drawn as ordered. See flow sheet for details.

## 2018-05-28 NOTE — Nursing Note (Addendum)
Kelsey Sullivan here today for C3 D1 of Taxol/Carbo. Informed consent verified. Pt arrived with port having been accessed in port room; positive blood return. Pt not a fall risk on assessment. See flow sheets for vital signs. Pt treatment held one week. ANC 1333.

## 2018-05-28 NOTE — Telephone Encounter (Signed)
Patient is calling to find out what she needs to do.  Her treatment for today got cancelled because her blood work was too low.

## 2018-06-03 ENCOUNTER — Ambulatory Visit: Payer: Medicare (Managed Care)

## 2018-06-04 ENCOUNTER — Other Ambulatory Visit: Admit: 2018-06-04 | Payer: Medicare (Managed Care)

## 2018-06-04 ENCOUNTER — Ambulatory Visit: Admit: 2018-06-04 | Payer: Medicare (Managed Care)

## 2018-06-04 DIAGNOSIS — C563 Malignant neoplasm of bilateral ovaries (CMS-HCC): Secondary | ICD-10-CM

## 2018-06-04 DIAGNOSIS — Z5111 Encounter for antineoplastic chemotherapy: Secondary | ICD-10-CM

## 2018-06-04 LAB — COMPREHENSIVE METABOLIC PANEL
ALT: 11 U/L (ref 7–52)
AST: 15 U/L (ref 13–39)
Albumin: 4.2 g/dL (ref 3.5–5.7)
Alkaline Phosphatase: 59 U/L (ref 36–125)
Anion Gap: 8 mmol/L (ref 3–16)
BUN: 16 mg/dL (ref 7–25)
CO2: 26 mmol/L (ref 21–33)
Calcium: 9.3 mg/dL (ref 8.6–10.3)
Chloride: 101 mmol/L (ref 98–110)
Creatinine: 0.62 mg/dL (ref 0.60–1.30)
Glucose: 116 mg/dL (ref 70–100)
Osmolality, Calculated: 282 mOsm/kg (ref 278–305)
Potassium: 4.3 mmol/L (ref 3.5–5.3)
Sodium: 135 mmol/L (ref 133–146)
Total Bilirubin: 0.4 mg/dL (ref 0.0–1.5)
Total Protein: 6.5 g/dL (ref 6.4–8.9)
eGFR AA CKD-EPI: >90 See note.
eGFR NONAA CKD-EPI: 88 See note.

## 2018-06-04 LAB — CBC
Hematocrit: 27.3 % (ref 35.0–45.0)
Hemoglobin: 9.7 g/dL (ref 11.7–15.5)
MCH: 36.3 pg (ref 27.0–33.0)
MCHC: 35.6 g/dL (ref 32.0–36.0)
MCV: 102 fL (ref 80.0–100.0)
MPV: 6.9 fL (ref 7.5–11.5)
Platelets: 205 10*3/uL (ref 140–400)
RBC: 2.68 10*6/uL (ref 3.80–5.10)
RDW: 19.1 % (ref 11.0–15.0)
WBC: 3.5 10*3/uL (ref 3.8–10.8)

## 2018-06-04 LAB — DIFFERENTIAL
Basophils Absolute: 28 /uL (ref 0–200)
Basophils Relative: 0.8 % (ref 0.0–1.0)
Eosinophils Absolute: 32 /uL (ref 15–500)
Eosinophils Relative: 0.9 % (ref 0.0–8.0)
Lymphocytes Absolute: 1148 /uL (ref 850–3900)
Lymphocytes Relative: 32.8 % (ref 15.0–45.0)
Monocytes Absolute: 357 /uL (ref 200–950)
Monocytes Relative: 10.2 % (ref 0.0–12.0)
Neutrophils Absolute: 1936 /uL (ref 1500–7800)
Neutrophils Relative: 55.3 % (ref 40.0–80.0)
nRBC: 0 /100 WBC (ref 0–0)

## 2018-06-04 LAB — MAGNESIUM: Magnesium: 1.7 mg/dL (ref 1.5–2.5)

## 2018-06-04 MED ORDER — diphenhydrAMINE (BENADRYL) injection 25 mg
50 | Freq: Once | INTRAMUSCULAR | Status: AC
Start: 2018-06-04 — End: 2018-06-04

## 2018-06-04 MED ORDER — PACLitaxel (TAXOL) 120 mg in sodium chloride 0.9 % 250 mL chemo infusion
Freq: Once | INTRAVENOUS | Status: AC
Start: 2018-06-04 — End: 2018-06-04
  Administered 2018-06-04: 15:00:00 120 mg/m2 via INTRAVENOUS

## 2018-06-04 MED ORDER — LORazepam (ATIVAN) tablet 0.5 mg
0.5 | Freq: Four times a day (QID) | ORAL | Status: AC | PRN
Start: 2018-06-04 — End: 2018-06-04

## 2018-06-04 MED ORDER — proCHLORPERazine (COMPAZINE) tablet 10 mg
10 | Freq: Four times a day (QID) | ORAL | Status: AC | PRN
Start: 2018-06-04 — End: 2018-06-04

## 2018-06-04 MED ORDER — diphenhydrAMINE (BENADRYL) capsule 25 mg
25 | Freq: Once | ORAL | Status: AC | PRN
Start: 2018-06-04 — End: 2018-06-04
  Administered 2018-06-04: 14:00:00 25 mg via ORAL

## 2018-06-04 MED ORDER — famotidine (PF) (PEPCID) injection 20 mg
20 | Freq: Once | INTRAVENOUS | Status: AC
Start: 2018-06-04 — End: 2018-06-04

## 2018-06-04 MED ORDER — CARBOplatin (PARAPLATIN) 480 mg in dextrose 5% in water (D5W) 250 mL chemo infusion
Freq: Once | INTRAVENOUS | Status: AC
Start: 2018-06-04 — End: 2018-06-04
  Administered 2018-06-04: 16:00:00 480 mg via INTRAVENOUS

## 2018-06-04 MED ORDER — dexAMETHasone (DECADRON) tablet 12 mg
4 | Freq: Once | ORAL | Status: AC
Start: 2018-06-04 — End: 2018-06-04
  Administered 2018-06-04: 14:00:00 12 mg via ORAL

## 2018-06-04 MED ORDER — EPINEPHrine 1 mg/mL injection
1 | Freq: Every day | INTRAMUSCULAR | Status: AC | PRN
Start: 2018-06-04 — End: 2018-06-04

## 2018-06-04 MED ORDER — heparin lock flush Syrg 500 Units
100 | Freq: Every day | INTRAVENOUS | Status: AC | PRN
Start: 2018-06-04 — End: 2018-06-04
  Administered 2018-06-04: 16:00:00 500 [IU]

## 2018-06-04 MED ORDER — sodium chloride 0.9 % infusion
Freq: Every day | INTRAVENOUS | Status: AC | PRN
Start: 2018-06-04 — End: 2018-06-04
  Administered 2018-06-04: 14:00:00 25 mL/h via INTRAVENOUS

## 2018-06-04 MED ORDER — diphenhydrAMINE (BENADRYL) injection 25 mg
50 | Freq: Every day | INTRAMUSCULAR | Status: AC | PRN
Start: 2018-06-04 — End: 2018-06-04

## 2018-06-04 MED ORDER — albuterol (PROVENTIL;VENTOLIN;PROAIR) inhaler 1-2 puff
90 | RESPIRATORY_TRACT | Status: AC | PRN
Start: 2018-06-04 — End: 2018-06-04

## 2018-06-04 MED ORDER — palonosetron (ALOXI) injection 0.25 mg
0.25 | Freq: Once | INTRAVENOUS | Status: AC
Start: 2018-06-04 — End: 2018-06-04
  Administered 2018-06-04: 14:00:00 0.25 mg via INTRAVENOUS

## 2018-06-04 MED ORDER — famotidine (PEPCID) tablet 20 mg
20 | Freq: Once | ORAL | Status: AC | PRN
Start: 2018-06-04 — End: 2018-06-04
  Administered 2018-06-04: 14:00:00 20 mg via ORAL

## 2018-06-04 MED ORDER — sodium chloride flush 10 mL
Freq: Every day | INTRAMUSCULAR | Status: AC | PRN
Start: 2018-06-04 — End: 2018-06-04
  Administered 2018-06-04: 16:00:00 10 mL via INTRAVENOUS

## 2018-06-04 MED ORDER — aprepitant (CINVANTI) IV emulsion 130 mg
7.2 | Freq: Once | INTRAVENOUS | Status: AC
Start: 2018-06-04 — End: 2018-06-04
  Administered 2018-06-04: 14:00:00 130 mg via INTRAVENOUS

## 2018-06-04 MED ORDER — hydrocortisone sod succ (PF)(SOLU-CORTEF) 100 mg injection
100 | Freq: Every day | INTRAMUSCULAR | Status: AC | PRN
Start: 2018-06-04 — End: 2018-06-04

## 2018-06-04 MED FILL — DEXAMETHASONE 4 MG TABLET: 4 4 MG | ORAL | Qty: 3

## 2018-06-04 MED FILL — DIPHENHYDRAMINE 25 MG CAPSULE: 25 25 mg | ORAL | Qty: 1

## 2018-06-04 MED FILL — PACLITAXEL 6 MG/ML CONCENTRATE,INTRAVENOUS: 6 6 mg/mL | INTRAVENOUS | Qty: 20

## 2018-06-04 MED FILL — CARBOPLATIN 10 MG/ML INTRAVENOUS SOLUTION: 10 10 mg/mL | INTRAVENOUS | Qty: 48

## 2018-06-04 MED FILL — CINVANTI 7.2 MG/ML INTRAVENOUS EMULSION: 7.2 7.2 mg/mL | INTRAVENOUS | Qty: 18

## 2018-06-04 MED FILL — PALONOSETRON 0.25 MG/5 ML INTRAVENOUS SOLUTION: 0.25 0.25 mg/5 mL | INTRAVENOUS | Qty: 5

## 2018-06-04 MED FILL — FAMOTIDINE 20 MG TABLET: 20 20 MG | ORAL | Qty: 1

## 2018-06-04 NOTE — Nursing Note (Addendum)
Kelsey Sullivan here for Day 1 / Cycle 3 Taxol / Carbo today.  Pt was held last week due to low ANC.  Pt arrived with port accessed from port room, site WNL, dressing dry and intact, positive blood return. Fall assessment completed, pt is low risk, oriented to room and call light given. Labs reviewed, denies any new issues. ANC 1936 today. Chemotherapy completed without incident.  Port flushed with 0.9% saline 10 mls and Heparin : 100 units per ml.  AVS given and reviewed. Pt discharged ambulatory with friend Nadine Counts at side.

## 2018-06-04 NOTE — Unmapped (Signed)
Lab draw from Venous Access Device

## 2018-06-11 ENCOUNTER — Ambulatory Visit: Admit: 2018-06-11 | Payer: Medicare (Managed Care)

## 2018-06-11 ENCOUNTER — Other Ambulatory Visit: Admit: 2018-06-11 | Payer: Medicare (Managed Care)

## 2018-06-11 DIAGNOSIS — C563 Malignant neoplasm of bilateral ovaries (CMS-HCC): Secondary | ICD-10-CM

## 2018-06-11 DIAGNOSIS — C561 Malignant neoplasm of right ovary: Secondary | ICD-10-CM

## 2018-06-11 LAB — CBC
Hematocrit: 26 % (ref 35.0–45.0)
Hemoglobin: 9.3 g/dL (ref 11.7–15.5)
MCH: 36.5 pg (ref 27.0–33.0)
MCHC: 35.7 g/dL (ref 32.0–36.0)
MCV: 102.2 fL (ref 80.0–100.0)
MPV: 7.4 fL (ref 7.5–11.5)
Platelets: 270 10*3/uL (ref 140–400)
RBC: 2.54 10*6/uL (ref 3.80–5.10)
RDW: 18.2 % (ref 11.0–15.0)
WBC: 3.2 10*3/uL (ref 3.8–10.8)

## 2018-06-11 LAB — COMPREHENSIVE METABOLIC PANEL
ALT: 14 U/L (ref 7–52)
AST: 19 U/L (ref 13–39)
Albumin: 4.1 g/dL (ref 3.5–5.7)
Alkaline Phosphatase: 64 U/L (ref 36–125)
Anion Gap: 7 mmol/L (ref 3–16)
BUN: 14 mg/dL (ref 7–25)
CO2: 27 mmol/L (ref 21–33)
Calcium: 9.2 mg/dL (ref 8.6–10.3)
Chloride: 99 mmol/L (ref 98–110)
Creatinine: 0.55 mg/dL — ABNORMAL LOW (ref 0.60–1.30)
Glucose: 125 mg/dL — ABNORMAL HIGH (ref 70–100)
Osmolality, Calculated: 278 mosm/kg (ref 278–305)
Potassium: 4.5 mmol/L (ref 3.5–5.3)
Sodium: 133 mmol/L (ref 133–146)
Total Bilirubin: 0.3 mg/dL (ref 0.0–1.5)
Total Protein: 6.2 g/dL — ABNORMAL LOW (ref 6.4–8.9)
eGFR AA CKD-EPI: >90 See note.
eGFR NONAA CKD-EPI: >90 See note.

## 2018-06-11 LAB — DIFFERENTIAL
Basophils Absolute: 22 /uL (ref 0–200)
Basophils Relative: 0.7 % (ref 0.0–1.0)
Eosinophils Absolute: 45 /uL (ref 15–500)
Eosinophils Relative: 1.4 % (ref 0.0–8.0)
Lymphocytes Absolute: 1094 /uL (ref 850–3900)
Lymphocytes Relative: 34.2 % (ref 15.0–45.0)
Monocytes Absolute: 253 /uL (ref 200–950)
Monocytes Relative: 7.9 % (ref 0.0–12.0)
Neutrophils Absolute: 1786 /uL (ref 1500–7800)
Neutrophils Relative: 55.8 % (ref 40.0–80.0)
nRBC: 0 /100{WBCs} (ref 0–0)

## 2018-06-11 LAB — MAGNESIUM: Magnesium: 1.4 mg/dL (ref 1.5–2.5)

## 2018-06-11 MED ORDER — diphenhydrAMINE (BENADRYL) injection 25 mg
50 | Freq: Every day | INTRAMUSCULAR | Status: AC | PRN
Start: 2018-06-11 — End: 2018-06-11

## 2018-06-11 MED ORDER — sodium chloride 0.9 % infusion
Freq: Every day | INTRAVENOUS | Status: AC | PRN
Start: 2018-06-11 — End: 2018-06-11
  Administered 2018-06-11: 14:00:00 25 mL/h via INTRAVENOUS

## 2018-06-11 MED ORDER — diphenhydrAMINE (BENADRYL) capsule 25 mg
25 | Freq: Once | ORAL | Status: AC | PRN
Start: 2018-06-11 — End: 2018-06-11
  Administered 2018-06-11: 14:00:00 25 mg via ORAL

## 2018-06-11 MED ORDER — famotidine (PEPCID) tablet 20 mg
20 | Freq: Once | ORAL | Status: AC | PRN
Start: 2018-06-11 — End: 2018-06-11
  Administered 2018-06-11: 14:00:00 20 mg via ORAL

## 2018-06-11 MED ORDER — hydrocortisone sod succ (PF)(SOLU-CORTEF) 100 mg injection
100 | Freq: Every day | INTRAMUSCULAR | Status: AC | PRN
Start: 2018-06-11 — End: 2018-06-11

## 2018-06-11 MED ORDER — heparin lock flush Syrg 500 Units
100 | Freq: Every day | INTRAVENOUS | Status: AC | PRN
Start: 2018-06-11 — End: 2018-06-11
  Administered 2018-06-11: 16:00:00 500 [IU]

## 2018-06-11 MED ORDER — PACLitaxel (TAXOL) 120 mg in sodium chloride 0.9 % 250 mL chemo infusion
Freq: Once | INTRAVENOUS | Status: AC
Start: 2018-06-11 — End: 2018-06-11
  Administered 2018-06-11: 14:00:00 120 mg/m2 via INTRAVENOUS

## 2018-06-11 MED ORDER — LORazepam (ATIVAN) tablet 0.5 mg
0.5 | Freq: Four times a day (QID) | ORAL | Status: AC | PRN
Start: 2018-06-11 — End: 2018-06-11

## 2018-06-11 MED ORDER — albuterol (PROVENTIL;VENTOLIN;PROAIR) inhaler 1-2 puff
90 | RESPIRATORY_TRACT | Status: AC | PRN
Start: 2018-06-11 — End: 2018-06-11

## 2018-06-11 MED ORDER — sodium chloride flush 10 mL
Freq: Every day | INTRAMUSCULAR | Status: AC | PRN
Start: 2018-06-11 — End: 2018-06-11
  Administered 2018-06-11: 16:00:00 10 mL via INTRAVENOUS

## 2018-06-11 MED ORDER — diphenhydrAMINE (BENADRYL) injection 25 mg
50 | Freq: Once | INTRAMUSCULAR | Status: AC
Start: 2018-06-11 — End: 2018-06-11

## 2018-06-11 MED ORDER — EPINEPHrine 1 mg/mL injection
1 | Freq: Every day | INTRAMUSCULAR | Status: AC | PRN
Start: 2018-06-11 — End: 2018-06-11

## 2018-06-11 MED ORDER — famotidine (PF) (PEPCID) injection 20 mg
20 | Freq: Once | INTRAVENOUS | Status: AC
Start: 2018-06-11 — End: 2018-06-11

## 2018-06-11 MED ORDER — dexAMETHasone (DECADRON) tablet 4 mg
4 | Freq: Once | ORAL | Status: AC
Start: 2018-06-11 — End: 2018-06-11
  Administered 2018-06-11: 14:00:00 4 mg via ORAL

## 2018-06-11 MED FILL — DEXAMETHASONE 4 MG TABLET: 4 4 MG | ORAL | Qty: 1

## 2018-06-11 MED FILL — PACLITAXEL 6 MG/ML CONCENTRATE,INTRAVENOUS: 6 6 mg/mL | INTRAVENOUS | Qty: 20

## 2018-06-11 MED FILL — DIPHENHYDRAMINE 25 MG CAPSULE: 25 25 mg | ORAL | Qty: 1

## 2018-06-11 MED FILL — FAMOTIDINE 20 MG TABLET: 20 20 MG | ORAL | Qty: 1

## 2018-06-11 NOTE — Nursing Note (Signed)
Port was accessed per protocol. Labs were obtained as ordered.

## 2018-06-11 NOTE — Nursing Note (Signed)
Heartly Hauptman Golomb arrives to treatment area ambulatory for C3D8 Taxol. RCWP accessed in lab area. Labs reviewed and within treatment parameters. Mg = 1.4. MD Billingsly made aware and following. Pre-medications administered. Patient tolerated treatment well with no adverse reactions. RCWP flushed with 10 mL NS and locked with heparin. RCWP deaccessed. AVS given, next appointment reviewed. Patient leaves treatment area ambulatory with no issues.    Jackalyn Lombard RN

## 2018-06-17 ENCOUNTER — Ambulatory Visit: Admit: 2018-06-17 | Payer: Medicare (Managed Care)

## 2018-06-17 ENCOUNTER — Ambulatory Visit: Payer: Medicare (Managed Care)

## 2018-06-17 DIAGNOSIS — C563 Malignant neoplasm of bilateral ovaries (CMS-HCC): Secondary | ICD-10-CM

## 2018-06-17 DIAGNOSIS — C561 Malignant neoplasm of right ovary: Secondary | ICD-10-CM

## 2018-06-17 LAB — CBC
Hematocrit: 24.5 % (ref 35.0–45.0)
Hemoglobin: 8.7 g/dL (ref 11.7–15.5)
MCH: 37.1 pg (ref 27.0–33.0)
MCHC: 35.6 g/dL (ref 32.0–36.0)
MCV: 104 fL (ref 80.0–100.0)
MPV: 7.3 fL (ref 7.5–11.5)
Platelets: 189 10*3/uL (ref 140–400)
RBC: 2.35 10*6/uL (ref 3.80–5.10)
RDW: 18.6 % (ref 11.0–15.0)
WBC: 2.8 10*3/uL (ref 3.8–10.8)

## 2018-06-17 LAB — COMPREHENSIVE METABOLIC PANEL
ALT: 13 U/L (ref 7–52)
AST: 20 U/L (ref 13–39)
Albumin: 4.2 g/dL (ref 3.5–5.7)
Alkaline Phosphatase: 54 U/L (ref 36–125)
Anion Gap: 6 mmol/L (ref 3–16)
BUN: 14 mg/dL (ref 7–25)
CO2: 27 mmol/L (ref 21–33)
Calcium: 9.3 mg/dL (ref 8.6–10.3)
Chloride: 102 mmol/L (ref 98–110)
Creatinine: 0.58 mg/dL — ABNORMAL LOW (ref 0.60–1.30)
Glucose: 154 mg/dL — ABNORMAL HIGH (ref 70–100)
Osmolality, Calculated: 284 mosm/kg (ref 278–305)
Potassium: 4.1 mmol/L (ref 3.5–5.3)
Sodium: 135 mmol/L (ref 133–146)
Total Bilirubin: 0.5 mg/dL (ref 0.0–1.5)
Total Protein: 6.5 g/dL (ref 6.4–8.9)
eGFR AA CKD-EPI: >90 See note.
eGFR NONAA CKD-EPI: 90 See note.

## 2018-06-17 LAB — DIFFERENTIAL
Basophils Absolute: 20 /uL (ref 0–200)
Basophils Relative: 0.7 % (ref 0.0–1.0)
Eosinophils Absolute: 6 /uL — ABNORMAL LOW (ref 15–500)
Eosinophils Relative: 0.2 % (ref 0.0–8.0)
Lymphocytes Absolute: 812 /uL — ABNORMAL LOW (ref 850–3900)
Lymphocytes Relative: 29 % (ref 15.0–45.0)
Monocytes Absolute: 213 /uL (ref 200–950)
Monocytes Relative: 7.6 % (ref 0.0–12.0)
Neutrophils Absolute: 1750 /uL (ref 1500–7800)
Neutrophils Relative: 62.5 % (ref 40.0–80.0)
nRBC: 0 /100{WBCs} (ref 0–0)

## 2018-06-17 LAB — MAGNESIUM: Magnesium: 1.5 mg/dL (ref 1.5–2.5)

## 2018-06-17 MED ORDER — hydrocortisone sod succ (PF)(SOLU-CORTEF) 100 mg injection
100 | Freq: Every day | INTRAMUSCULAR | Status: AC | PRN
Start: 2018-06-17 — End: 2018-06-17

## 2018-06-17 MED ORDER — albuterol (PROVENTIL;VENTOLIN;PROAIR) inhaler 1-2 puff
90 | RESPIRATORY_TRACT | Status: AC | PRN
Start: 2018-06-17 — End: 2018-06-17

## 2018-06-17 MED ORDER — diphenhydrAMINE (BENADRYL) injection 25 mg
50 | Freq: Every day | INTRAMUSCULAR | Status: AC | PRN
Start: 2018-06-17 — End: 2018-06-17

## 2018-06-17 MED ORDER — diphenhydrAMINE (BENADRYL) injection 25 mg
50 | Freq: Once | INTRAMUSCULAR | Status: AC
Start: 2018-06-17 — End: 2018-06-17
  Administered 2018-06-17: 16:00:00 25 mg via INTRAVENOUS

## 2018-06-17 MED ORDER — dexAMETHasone (DECADRON) tablet 4 mg
4 | Freq: Once | ORAL | Status: AC
Start: 2018-06-17 — End: 2018-06-17
  Administered 2018-06-17: 16:00:00 4 mg via ORAL

## 2018-06-17 MED ORDER — PACLitaxel (TAXOL) 120 mg in sodium chloride 0.9 % 250 mL chemo infusion
6 | Freq: Once | INTRAVENOUS | Status: AC
Start: 2018-06-17 — End: 2018-06-17
  Administered 2018-06-17: 17:00:00 120 mg/m2 via INTRAVENOUS

## 2018-06-17 MED ORDER — EPINEPHrine 1 mg/mL injection
1 | Freq: Every day | INTRAMUSCULAR | Status: AC | PRN
Start: 2018-06-17 — End: 2018-06-17

## 2018-06-17 MED ORDER — LORazepam (ATIVAN) tablet 0.5 mg
0.5 | Freq: Four times a day (QID) | ORAL | Status: AC | PRN
Start: 2018-06-17 — End: 2018-06-17

## 2018-06-17 MED ORDER — famotidine (PEPCID) tablet 20 mg
20 | Freq: Once | ORAL | Status: AC | PRN
Start: 2018-06-17 — End: 2018-06-17

## 2018-06-17 MED ORDER — famotidine (PF) (PEPCID) injection 20 mg
20 | Freq: Once | INTRAVENOUS | Status: AC
Start: 2018-06-17 — End: 2018-06-17
  Administered 2018-06-17: 16:00:00 20 mg via INTRAVENOUS

## 2018-06-17 MED ORDER — sodium chloride flush 10 mL
Freq: Every day | INTRAMUSCULAR | Status: AC | PRN
Start: 2018-06-17 — End: 2018-06-17
  Administered 2018-06-17: 18:00:00 10 mL via INTRAVENOUS

## 2018-06-17 MED ORDER — sodium chloride 0.9 % infusion
Freq: Every day | INTRAVENOUS | Status: AC | PRN
Start: 2018-06-17 — End: 2018-06-17

## 2018-06-17 MED ORDER — diphenhydrAMINE (BENADRYL) capsule 25 mg
25 | Freq: Once | ORAL | Status: AC | PRN
Start: 2018-06-17 — End: 2018-06-17

## 2018-06-17 MED ORDER — heparin lock flush Syrg 500 Units
100 | Freq: Every day | INTRAVENOUS | Status: AC | PRN
Start: 2018-06-17 — End: 2018-06-17
  Administered 2018-06-17: 18:00:00 500 [IU]

## 2018-06-17 MED FILL — DEXAMETHASONE 4 MG TABLET: 4 4 MG | ORAL | Qty: 1

## 2018-06-17 MED FILL — DIPHENHYDRAMINE 50 MG/ML INJECTION SOLUTION: 50 50 mg/mL | INTRAMUSCULAR | Qty: 1

## 2018-06-17 MED FILL — PACLITAXEL 6 MG/ML CONCENTRATE,INTRAVENOUS: 6 6 mg/mL | INTRAVENOUS | Qty: 20

## 2018-06-17 MED FILL — FAMOTIDINE (PF) 20 MG/2 ML INTRAVENOUS SOLUTION: 20 20 mg/2 mL | INTRAVENOUS | Qty: 2

## 2018-06-17 NOTE — Nursing Note (Signed)
Labs drawn from implanted venous access device.

## 2018-06-17 NOTE — Nursing Note (Signed)
Pt arrived ambulatory to infusion area for scheduled tx. Pt arrived with port having been accessed in port room; positive blood return. Administered D15 C3 of Taxol as ordered without incident. Pt tolerated all well and denied other needs; AVS given to pt and pt verbalized understanding to upcoming appts. Port flushed with NS 10 ml and 5 ml Heparin 100 units/ml. Positive blood return noted. Port deaccessed and gauze/band-aid applied. Discharged ambulatory with husband.

## 2018-06-17 NOTE — Progress Notes (Signed)
INTEGRATIVE MEDICINE NOTE  Infusion Suite     Patient:  Kelsey Sullivan  MRN: [id number]  Date:  06/17/2018        Tien is a 77 y.o. female  was seen today by [facility].        Patient is Alert    Patient provided consent verbally.     Patient is is accompanied by family.        RATING YOUR SYMPTOM(S)  Pre or Current Symptom(s):    Pre Session Symptom(s):  Anxiety: 0 None  Fatigue: 5 Moderate  Nausea: 0 None0 None  Pain: 0 None  Overall Distress Level:3 Mild     Post Session Symptom(s):  Anxiety: 0 None  Fatigue: 4 Moderate}  Nausea: 0 None  Pain: 0 None  Overall Distress Level: 3 Mild      GOAL(S)  Goal(s) for Today  Increase relaxation    Techniques Used Today:  Guided Relaxation (with Yoga Nidra - Abbreviated)    Patient able to assess goals met today? Yes    Goal(s) Met for Today:  Yes. Patient fell asleep.        Additional Therapist Comments:      Would you recommend this experience/services to other Infusion patients? Yes     Total Time Spent with Patient: 25 minutes.    Leatha Rohner

## 2018-06-24 ENCOUNTER — Ambulatory Visit: Payer: Medicare (Managed Care) | Attending: Gerontology

## 2018-06-25 ENCOUNTER — Ambulatory Visit: Admit: 2018-06-25 | Payer: Medicare (Managed Care)

## 2018-06-25 ENCOUNTER — Other Ambulatory Visit: Admit: 2018-06-25 | Payer: Medicare (Managed Care)

## 2018-06-25 ENCOUNTER — Ambulatory Visit: Payer: Medicare (Managed Care)

## 2018-06-25 DIAGNOSIS — C561 Malignant neoplasm of right ovary: Secondary | ICD-10-CM

## 2018-06-25 DIAGNOSIS — C5701 Malignant neoplasm of right fallopian tube: Secondary | ICD-10-CM

## 2018-06-25 DIAGNOSIS — C563 Malignant neoplasm of bilateral ovaries (CMS-HCC): Secondary | ICD-10-CM

## 2018-06-25 LAB — DIFFERENTIAL
Basophils Absolute: 28 /uL (ref 0–200)
Basophils Relative: 1.2 % (ref 0.0–1.0)
Eosinophils Absolute: 12 /uL (ref 15–500)
Eosinophils Relative: 0.5 % (ref 0.0–8.0)
Lymphocytes Absolute: 991 /uL (ref 850–3900)
Lymphocytes Relative: 43.1 % (ref 15.0–45.0)
Monocytes Absolute: 223 /uL (ref 200–950)
Monocytes Relative: 9.7 % (ref 0.0–12.0)
Neutrophils Absolute: 1047 /uL (ref 1500–7800)
Neutrophils Relative: 45.5 % (ref 40.0–80.0)
nRBC: 0 /100 WBC (ref 0–0)

## 2018-06-25 LAB — COMPREHENSIVE METABOLIC PANEL
ALT: 13 U/L (ref 7–52)
AST: 22 U/L (ref 13–39)
Albumin: 4.5 g/dL (ref 3.5–5.7)
Alkaline Phosphatase: 54 U/L (ref 36–125)
Anion Gap: 7 mmol/L (ref 3–16)
BUN: 12 mg/dL (ref 7–25)
CO2: 26 mmol/L (ref 21–33)
Calcium: 9.7 mg/dL (ref 8.6–10.3)
Chloride: 100 mmol/L (ref 98–110)
Creatinine: 0.71 mg/dL (ref 0.60–1.30)
Glucose: 118 mg/dL (ref 70–100)
Osmolality, Calculated: 277 mOsm/kg (ref 278–305)
Potassium: 4.1 mmol/L (ref 3.5–5.3)
Sodium: 133 mmol/L (ref 133–146)
Total Bilirubin: 0.5 mg/dL (ref 0.0–1.5)
Total Protein: 6.6 g/dL (ref 6.4–8.9)
eGFR AA CKD-EPI: >90 See note.
eGFR NONAA CKD-EPI: 83 See note.

## 2018-06-25 LAB — CBC
Hematocrit: 23.7 % (ref 35.0–45.0)
Hemoglobin: 8.4 g/dL (ref 11.7–15.5)
MCH: 37.2 pg (ref 27.0–33.0)
MCHC: 35.5 g/dL (ref 32.0–36.0)
MCV: 104.7 fL (ref 80.0–100.0)
MPV: 7.4 fL (ref 7.5–11.5)
Platelets: 136 10*3/uL (ref 140–400)
RBC: 2.26 10*6/uL (ref 3.80–5.10)
RDW: 18.3 % (ref 11.0–15.0)
WBC: 2.3 10*3/uL (ref 3.8–10.8)

## 2018-06-25 LAB — MAGNESIUM: Magnesium: 1.7 mg/dL (ref 1.5–2.5)

## 2018-06-25 LAB — CA 125: CA 125: 10.6 U/mL (ref 5.5–35.0)

## 2018-06-25 MED ORDER — sodium chloride flush 10 mL
Freq: Every day | INTRAMUSCULAR | Status: AC | PRN
Start: 2018-06-25 — End: 2018-06-25

## 2018-06-25 MED ORDER — heparin lock flush (porcine) vial Soln 500 Units
100 | Freq: Every day | INTRAVENOUS | Status: AC | PRN
Start: 2018-06-25 — End: 2018-06-26

## 2018-06-25 MED ORDER — sodium chloride flush 10 mL
Freq: Every day | INTRAMUSCULAR | Status: AC | PRN
Start: 2018-06-25 — End: 2018-06-25
  Administered 2018-06-25: 14:00:00 10 mL via INTRAVENOUS

## 2018-06-25 MED ORDER — heparin lock flush Syrg 500 Units
100 | Freq: Every day | INTRAVENOUS | Status: AC | PRN
Start: 2018-06-25 — End: 2018-06-25
  Administered 2018-06-25: 14:00:00 500 [IU] via INTRAVENOUS

## 2018-06-25 NOTE — Nursing Note (Signed)
Cancelled.  

## 2018-06-25 NOTE — Unmapped (Signed)
History of Present Illness  Kelsey Sullivan is a 77 y.o. female with   Chief Complaint   Patient presents with   ??? 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 She declined adjuvant therapy. She used natural methods, went to a raw diet for a year, etc.  She developed a small bowel obstruction in 03/2017, resolved on its own, with NED at that time.  In 01/2018, she experienced mid abdominal pain, and seen by PCP and ED, with CT of abdomen and pelvis showing interval development of necrotic retroperitoneal lymphadenopathy with largest node measuring 4.9  4.2 x 4.8 cm. Her CA 125 on 02/18/18 was 197.8.  She underwent a ct-guided nodal biopsy on 02/26/18, with pathology confirming recurrent disease.  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 She was seen at [facility] in [place] with Dr. Ishmael Holter. She is undergoing chemotherapy with Carboplatin and Paclitaxel-weekly.        Kelsey Sullivan is doing well. She report some mild fatigue and has noted taste changes but she is eating and has gained weight.  She denies any bloating or fullness.  She reports normal bowel/bladder habits. She does report loose stools at times and relates this to her diet.   She denies any GU concerns. Denies any dysuria, frequency, hematuria, flank pain or  fevers.  She denies any vaginal bleeding or discharge.   She denies neuropathy.   She reports no leg swelling.   Her appetite is good, and reports no nausea or vomiting. She has gained weight, now up to 123 lbs    Of note she had a small healing blister left buttocks, she has started Valtrex as she has a history of shingles in the past.       Patient Active Problem List    Diagnosis   ??? 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 Pt declined adjuvant therapy      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     Posterior cul de sac biopsy-metastatic adenocarcinoma.     Omentectomy- metastatic adenocarcinoma     LND-negative     Residual right  IP ligament, Anterior cul de sac biopsy, right pelvic side will biopsy, left pelvic side wall, right gutter and left gutter-->benign    04/06/2017:   CT [name]- mild small bowel obstruction with transition point in the left pelvis adjacent to the anterior abdominal wall. Low density in the common femoral veins bilaterally thought likely to represent flow artificat, may consider follow up with lomer extremity Doppler to ensure there is no underlying deep venous thrombosis.    01/29/2018:   CTAP:  Interval development of necrotic retroperitoneal lymphadenopathy with largest node measuring 4.9  4.2 x 4.8 cm. Origin is indeterminate.  Options would include CT directed biopsy as well as PET/CT.    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  No other focal areas of FD avid malignancy are found. Specifically, no lymph node activity is found outside of the retroperitoneum and there is no evidence for FDG avid pulmonary, skeletal or hepatic parenchymal metastasis.    02/2018:   Patient saw Dr. Ishmael Holter who recommended Carbo/Taxol with an AUC of 6.  Dr. Maren Reamer office submitted foundation one testing. Patient would like Genetic Testing    04/12/2018:   IR port placed    04/16/2018:   C1 weekly taxol 80 mg/m2 D1,8,15, Carboplatin AUC 6 D1 q 21 days. PCEs on thurs, txt on fridays. EXAMS ON EVEN CYCLES     CA 125: 523.4     Genetic testing: Neg for CS mutation, no VUS, lifetime remaining breast cancer risk -3.4%    05/07/2018:   C2 weekly taxol 80 mg/m2 D1,8,15, Carboplatin AUC 6 D1 q 21 days. PCEs on thurs, txt on fridays. EXAMS ON EVEN CYCLES     CA 125: 162.2    05/20/2018:   Early PCE due to scheduling, due 6/21    05/28/2018  Delay C3 due to low ANC     CA 125: 25    06/04/2018  C3 weekly taxol 80 mg/m2 D1,8,15, Carboplatin AUC 6 D1 q 21 days. EXAMS ON EVEN CYCLES.      CA 125    06/25/2018:   HOLD chemo due to low anc (1047)- defer to next week, and change to q 28 days cycle to build in an off week to allow for counts to recover     CA 125: 10.6    07/02/2018:   C4 weekly taxol 80 mg/m2 D1,8,15, Carboplatin AUC 6 D1 q 28 days. EXAMS ON EVEN CYCLES.  Review her genetics testing results       Disposition: chemo, imaging after 3 cucles, CA 125, possible parp maintenance after, genetic testing pending, foundation  one testing results from [facility] in [place]  Current disease status: Neg for CS mutation, no VUS, lifetime remaining breast cancer risk -3.4%  Genetics: negative for mutations, breast cancer lifetime risk 3.4%  Survivorship plan: pending completion of treatment              Past Medical History  She  has a past medical history of Bowel obstruction (CMS Dx) (2018) and Ovarian cancer (CMS Dx) (2014).  Past Medical  History:   Diagnosis Date   ??? Bowel obstruction (CMS Dx) 2018   ??? Ovarian cancer (CMS Dx) 2014       Past Surgical History  Past Surgical History:   Procedure Laterality Date   ??? ABDOMINOPLASTY  1999   ??? BLEPHAROPLASTY  2000   ??? DILATION AND CURETTAGE OF UTERUS  1979   ??? ELBOW SURGERY  1990   ??? EYE SURGERY  1994   ??? KNEE ARTHROPLASTY Right    ??? ROTATOR CUFF REPAIR     ??? TRANSUMBILICAL AUGMENTATION MAMMAPLASTY     ??? VAGINAL HYSTERECTOMY  1979       Family History  History reviewed. No pertinent family history.    Family cancer history for ovarian, uterine and colon cancer is negative other than above.       Social History  Social History     Social History   ??? Marital status: Divorced     Spouse name: N/A   ??? Number of children: N/A   ??? Years of education: N/A     Social History Main Topics   ??? Smoking status: Former Smoker   ??? Smokeless tobacco: Never Used   ??? Alcohol use Yes      Comment: 2-3 drinks a day   ??? Drug use: No   ??? Sexual activity: Not Asked     Other Topics Concern   ??? Caffeine Use No   ??? Occupational Exposure No   ??? Exercise Yes   ??? Seat Belt Yes     Social History Narrative    Mammogram-6 years ago and it was normal. Pt stated she isn't getting them anymore.      Colonoscopy-10 years ago        Pt reports she can lie flat in bed without SOB.    Pt states she can walk a flight of stairs and city block without chest pain and SOB>         Past OB/GYN History  G3P2  She reports menarche at age 82 and menopause at age 24-surgical.  She denies a history of STIs.  She denies a history of abnormal cervical  cytology and reports her last cytologic examination she is unsure of. She has taken HRT.     Health maintenance:  Mammogram: Date 6 years ago Results normal  Colonoscopy: Date 10 years ago Results normal    Allergies  She is allergic to bactrim [sulfamethoxazole-trimethoprim]; nsaids (non-steroidal anti-inflammatory drug); celecoxib; and metoclopramide.    BMI  Body mass index is 21.11 kg/m??.    Disease Status: Systemic recurrence of disease (04/01/2018 12:43 PM)          Histories  She has a past medical history of Bowel obstruction (CMS Dx) (2018) and Ovarian cancer (CMS Dx) (2014).    She has a past surgical history that includes Dilation and curettage of uterus (1979); Vaginal hysterectomy (1979); Elbow surgery (1990); Eye surgery (1994); Abdominoplasty (1999); Blepharoplasty (2000); Transumbilical augmentation mammaplasty; Rotator cuff repair; and Knee Arthroplasty (Right).    Her family history is not on file.    She reports that she has quit smoking. She has never used smokeless tobacco. She reports that she drinks alcohol. She reports that she does not use drugs.    Allergies  Bactrim [sulfamethoxazole-trimethoprim]; Nsaids (non-steroidal anti-inflammatory drug); Celecoxib; and Metoclopramide    Medications  Outpatient Encounter Prescriptions as of 06/25/2018   Medication Sig Dispense Refill   ??? dexamethasone (DECADRON) 4 MG tablet Take 8  mg by mouth daily with food on day 2, 3, and 4 of chemotherapy cycle 18 tablet 6   ??? digestive enzymes Cap Take by mouth.     ??? ergocalciferol, vitamin D2, (VITAMIN D ORAL) Take 10,000 Units by mouth daily.            ??? ibuprofen (ADVIL,MOTRIN) 200 MG tablet Take 200-800 mg by mouth every 8 hours as needed for Pain.     ??? ibuprofen-diphenhydramine HCl 200-25 mg Cap Take 1 tablet by mouth at bedtime as needed.     ??? Lactobac no.41/Bifidobact no.7 (PROBIOTIC-10 ORAL) Take 1 capsule by mouth daily. [employer] brand     ??? lidocaine-prilocaine (EMLA) cream Apply topically  daily as needed. Apply pea-size amount to port site 30-60 min prior to accessing port 30 g 1   ??? MAGNESIUM SULFATE TOP Apply topically daily.     ??? omeprazole (PRILOSEC) 20 MG capsule Take 20 mg by mouth daily.            ??? ondansetron (ZOFRAN) 4 MG tablet Take 1 tablet (4 mg total) by mouth every 8 hours as needed for Nausea. Must wait to take until 5 days after receiving Aloxi 20 tablet 2   ??? proCHLORPERazine (COMPAZINE) 10 MG tablet Take 1 tablet (10 mg total) by mouth every 6 hours as needed (For nausea and vomiting). 30 tablet 3   ??? vit b complex w-b 12 (B COMPLEX-VITAMIN B12) tablet Take 1 tablet by mouth daily.     ??? vitamin E 100 UNIT capsule Take 100 Units by mouth daily.       Facility-Administered Encounter Medications as of 06/25/2018   Medication Dose Route Frequency Provider Last Rate Last Dose   ??? heparin lock flush (porcine) vial Soln 500 Units  500 Units Intravenous Daily PRN Kelsey Bosworth, MD       ??? [DISCONTINUED] sodium chloride flush 10 mL  10 mL Intravenous Daily PRN Kelsey Bosworth, MD            Review of Systems   Constitutional: Negative for activity change, appetite change, chills, fatigue, fever, weight gain and weight loss.   HENT: Negative for congestion, mouth sores, rhinorrhea, sinus pressure, sore throat and trouble swallowing.    Eyes: Negative for photophobia, redness, itching and visual disturbance.   Respiratory: Negative for apnea, cough, chest tightness and shortness of breath.    Cardiovascular: Negative for chest pain, palpitations and leg swelling.   Gastrointestinal: Negative for abdominal distention, abdominal pain ( intermittent, improving), anal bleeding, bloating, blood in stool, constipation, diarrhea, nausea and vomiting.   Genitourinary: Negative for decreased urine volume, difficulty urinating, dysuria, frequency, genital sores, hematuria, pelvic pain, urgency, vaginal bleeding, vaginal discharge and vaginal pain.   Musculoskeletal: Negative  for arthralgias, back pain, joint swelling and neck pain.   Skin: Negative for color change, pallor and wound.   Neurological: Negative for dizziness, seizures, weakness, numbness and headaches.   Hematological: Negative for adenopathy. Does not bruise/bleed easily.   Psychiatric/Behavioral: Negative for agitation, confusion and depression. The patient is not nervous/anxious.        Vitals  Blood pressure 125/66, pulse 80, temperature 97.1 ??F (36.2 ??C), temperature source Temporal, resp. rate 18, height 5' 4 (1.626 m), weight 123 lb (55.8 kg), SpO2 99 %.    Physical Exam   Vitals reviewed.  Constitutional: She is oriented to person, place, and time. She appears well-developed and well-nourished.   HENT:   Head: Normocephalic  and atraumatic.   Eyes: Conjunctivae and EOM are normal.   Neck: Normal range of motion. Neck supple. No thyromegaly present.   Cardiovascular: Normal rate, regular rhythm and normal heart sounds.    Pulmonary/Chest: Effort normal and breath sounds normal. No respiratory distress. She has no wheezes. She has no rales.   Abdominal: Soft. Bowel sounds are normal. She exhibits no distension and no mass. There is no tenderness. There is no rebound and no guarding.   No masses or tenderness.  No hernia palpable.   No hepatosplenomegaly.     Prior scars from xlap, tummy tuck   Genitourinary:   Genitourinary Comments: EXAMS ON EVEN CYCLES  Normal external genitalia/vulva.  Normal urethral meatus, urethra, bladder, anus and perineum.  Normal vagina and vaginal cuff.  Uterus, cervix and adnexa surgically absent.  Normal rectum.  There is no nodularity in the posterior culdesac or rectovaginal septum.    a small healing blister left buttocks   Musculoskeletal: Normal range of motion. She exhibits no edema.   Lymphadenopathy:     She has no cervical adenopathy.   Neurological: She is alert and oriented to person, place, and time.   Skin: Skin is warm and dry.        Red raised lesion left lateral calf, no  ulceration, no itching. History of extensive sun exposure.   Psychiatric: She has a normal mood and affect. Her behavior is normal. Judgment and thought content normal.          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     Posterior cul de sac biopsy-metastatic adenocarcinoma.     Omentectomy- metastatic adenocarcinoma     LND-negative     Residual right IP ligament, Anterior cul de sac biopsy, right pelvic side will biopsy, left pelvic side wall, right gutter and left gutter-->benign    04/06/2017:   CT [name]- mild small bowel obstruction with transition point in the left pelvis adjacent to the anterior abdominal wall. Low density in the common femoral veins bilaterally thought likely to represent flow artificat, may consider follow up with lomer extremity Doppler to ensure there is no underlying deep venous thrombosis.    01/29/2018:   CTAP:  Interval development of necrotic retroperitoneal lymphadenopathy with largest node measuring 4.9  4.2 x 4.8 cm. Origin is indeterminate.  Options would include CT directed biopsy as well as PET/CT.    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  No other focal areas of FD avid malignancy are found. Specifically, no lymph node activity is found outside of the retroperitoneum and there is no evidence for FDG avid pulmonary, skeletal or hepatic parenchymal metastasis.    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    We discussed recurrent ovarian cancer in detail and agree with the recommendation of Dr. Ishmael Holter for treatment platinum-based chemotherapy with Carboplatin and Paclitaxel.  The patient is interested in dose-dense Paclitaxel and we will proceed in this manner.      Today she presents for C4D1 of carbo/plus weekly taxol. HOLD due to low anc    She is tolerating chemotherapy appropriately well, and has experienced expected but manageable side effects. I have reviewed her labs, Her WBC/ANC 2.02/1046 we will delay the start of cycle 4 for 1 week and change to 28 days cycle to allow for one week off after D15 to allow for counts to recover. There is no evidence of progressive disease or excessive toxicity today, and we  will continue treatment without dose modification. We will see her back for her next cycle of treatment, and she knows to call if questions or problems arise. We have ordered nadir and pre-chemo labs and will continue monitoring in between cycles.       Genetics:  previousley discussed her negative genetic testing results.    Skin Lesions: Red raised lesion left lateral calf, no ulceration, no itching. History of extensive sun exposure. Encouraged her to schedule appt with dermatologist for evaluation    Pain  Pain Score: Zero (06/25/2018  8:35 AM)  Pain Loc: ABDOMEN (05/06/2018  1:54 PM)  She reports pain is intermittent, at time of visit she denied pain.  Pain will be reassessed at next visit.               Tery Sanfilippo, CNP  Division of Gynecologic Oncology  [phone]    I saw and personally examined the patient today with my CNP [place]. I discussed the findings and therapeutic plan with the patient. I repeated, reviewed and agree with the history of present illness, past medical histories, family history, social history, medication list, and allergies as listed. The review of systems is as noted above. My physical exam confirms the findings listed above. Review of labs, pathology reports, radiograph reports, and medical records confirm the findings noted above. I agree with the assessment and plan as noted above. I have edited the note where appropriate.     I have  personally performed a face to face diagnostic evaluation on this patient, seen initially by the CNP. I have personally developed the care plan as outlined in the Assessment and Plan.      Complexity: high     I spent a total of 30 minutes face-to-face of which >50% was spent in counseling and/or coordination of care, documentation and counseling.with patient and/or family.      Topics discussed include ovarian  Cancer prognosis and treatment options and chemo management .      Maggie Font, MD  Gynecology  Oncology  [phone]      Medical Decision Making:  The following items were considered in medical decision making:  Review / order clinical lab tests  Review / order radiology tests  Review / order other diagnostic tests/interventions

## 2018-06-25 NOTE — Nursing Note (Signed)
9604 Port accessed per protocol. Blood return noted. Flushed with 10ml of normal saline, occlusive dressing placed over the huber needle.  Labs drawn via port.  0945 Positive blood return noted.  Port flushed with 20ml of normal saline, followed with 5ml of 100u/ml of heparinized saline. Huber needle removed. Band aid applied.

## 2018-07-02 ENCOUNTER — Ambulatory Visit: Payer: Medicare (Managed Care)

## 2018-07-02 ENCOUNTER — Ambulatory Visit: Admit: 2018-07-02 | Payer: Medicare (Managed Care)

## 2018-07-02 ENCOUNTER — Other Ambulatory Visit: Admit: 2018-07-02 | Payer: Medicare (Managed Care)

## 2018-07-02 DIAGNOSIS — Z5111 Encounter for antineoplastic chemotherapy: Secondary | ICD-10-CM

## 2018-07-02 DIAGNOSIS — D690 Allergic purpura: Secondary | ICD-10-CM

## 2018-07-02 LAB — COMPREHENSIVE METABOLIC PANEL
ALT: 10 U/L (ref 7–52)
AST: 18 U/L (ref 13–39)
Albumin: 4.5 g/dL (ref 3.5–5.7)
Alkaline Phosphatase: 52 U/L (ref 36–125)
Anion Gap: 6 mmol/L (ref 3–16)
BUN: 12 mg/dL (ref 7–25)
CO2: 28 mmol/L (ref 21–33)
Calcium: 9.5 mg/dL (ref 8.6–10.3)
Chloride: 101 mmol/L (ref 98–110)
Creatinine: 0.6 mg/dL (ref 0.60–1.30)
Glucose: 128 mg/dL (ref 70–100)
Osmolality, Calculated: 281 mOsm/kg (ref 278–305)
Potassium: 4.2 mmol/L (ref 3.5–5.3)
Sodium: 135 mmol/L (ref 133–146)
Total Bilirubin: 0.6 mg/dL (ref 0.0–1.5)
Total Protein: 6.8 g/dL (ref 6.4–8.9)
eGFR AA CKD-EPI: >90 See note.
eGFR NONAA CKD-EPI: 89 See note.

## 2018-07-02 LAB — CBC
Hematocrit: 24.5 % — ABNORMAL LOW (ref 35.0–45.0)
Hemoglobin: 8.9 g/dL — ABNORMAL LOW (ref 11.7–15.5)
MCH: 39.1 pg — ABNORMAL HIGH (ref 27.0–33.0)
MCHC: 36.2 g/dL — ABNORMAL HIGH (ref 32.0–36.0)
MCV: 108.1 fL — ABNORMAL HIGH (ref 80.0–100.0)
MPV: 7.8 fL (ref 7.5–11.5)
Platelets: 150 10*3/uL (ref 140–400)
RBC: 2.26 10*6/uL — ABNORMAL LOW (ref 3.80–5.10)
RDW: 19.7 % — ABNORMAL HIGH (ref 11.0–15.0)
WBC: 2.8 10*3/uL — ABNORMAL LOW (ref 3.8–10.8)

## 2018-07-02 LAB — DIFFERENTIAL
Basophils Absolute: 31 /uL (ref 0–200)
Basophils Relative: 1.1 % (ref 0.0–1.0)
Eosinophils Absolute: 25 /uL (ref 15–500)
Eosinophils Relative: 0.9 % (ref 0.0–8.0)
Lymphocytes Absolute: 977 /uL (ref 850–3900)
Lymphocytes Relative: 34.9 % (ref 15.0–45.0)
Monocytes Absolute: 283 /uL (ref 200–950)
Monocytes Relative: 10.1 % (ref 0.0–12.0)
Neutrophils Absolute: 1484 /uL (ref 1500–7800)
Neutrophils Relative: 53 % (ref 40.0–80.0)
nRBC: 0 /100 WBC (ref 0–0)

## 2018-07-02 LAB — MAGNESIUM: Magnesium: 2 mg/dL (ref 1.5–2.5)

## 2018-07-02 LAB — CA 125: CA 125: 7.2 U/mL (ref 5.5–35.0)

## 2018-07-02 MED ORDER — sodium chloride flush 10 mL
Freq: Every day | INTRAMUSCULAR | Status: AC | PRN
Start: 2018-07-02 — End: 2018-07-02
  Administered 2018-07-02: 17:00:00 10 mL via INTRAVENOUS

## 2018-07-02 MED ORDER — palonosetron (ALOXI) injection 0.25 mg
0.25 | Freq: Once | INTRAVENOUS | Status: AC
Start: 2018-07-02 — End: 2018-07-02
  Administered 2018-07-02: 14:00:00 0.25 mg via INTRAVENOUS

## 2018-07-02 MED ORDER — famotidine (PF) (PEPCID) injection 20 mg
20 | Freq: Once | INTRAVENOUS | Status: AC
Start: 2018-07-02 — End: 2018-07-02
  Administered 2018-07-02: 14:00:00 20 mg via INTRAVENOUS

## 2018-07-02 MED ORDER — diphenhydrAMINE (BENADRYL) capsule 25 mg
25 | Freq: Once | ORAL | Status: AC | PRN
Start: 2018-07-02 — End: 2018-07-02
  Administered 2018-07-02: 14:00:00 25 mg via ORAL

## 2018-07-02 MED ORDER — diphenhydrAMINE (BENADRYL) injection 25 mg
50 | Freq: Every day | INTRAMUSCULAR | Status: AC | PRN
Start: 2018-07-02 — End: 2018-07-02

## 2018-07-02 MED ORDER — diphenhydrAMINE (BENADRYL) injection 25 mg
50 | Freq: Once | INTRAMUSCULAR | Status: AC
Start: 2018-07-02 — End: 2018-07-02

## 2018-07-02 MED ORDER — heparin lock flush Syrg 500 Units
100 | Freq: Every day | INTRAVENOUS | Status: AC | PRN
Start: 2018-07-02 — End: 2018-07-02
  Administered 2018-07-02: 17:00:00 500 [IU]

## 2018-07-02 MED ORDER — LORazepam (ATIVAN) tablet 0.5 mg
0.5 | Freq: Four times a day (QID) | ORAL | Status: AC | PRN
Start: 2018-07-02 — End: 2018-07-02

## 2018-07-02 MED ORDER — CARBOplatin (PARAPLATIN) 480 mg in dextrose 5% in water (D5W) 250 mL chemo infusion
10 | Freq: Once | INTRAVENOUS | Status: AC
Start: 2018-07-02 — End: 2018-07-02
  Administered 2018-07-02: 16:00:00 480 mg via INTRAVENOUS

## 2018-07-02 MED ORDER — EPINEPHrine 1 mg/mL injection
1 | Freq: Every day | INTRAMUSCULAR | Status: AC | PRN
Start: 2018-07-02 — End: 2018-07-02

## 2018-07-02 MED ORDER — aprepitant (CINVANTI) IV emulsion 130 mg
7.2 | Freq: Once | INTRAVENOUS | Status: AC
Start: 2018-07-02 — End: 2018-07-02
  Administered 2018-07-02: 14:00:00 130 mg via INTRAVENOUS

## 2018-07-02 MED ORDER — famotidine (PEPCID) tablet 20 mg
20 | Freq: Once | ORAL | Status: AC | PRN
Start: 2018-07-02 — End: 2018-07-02

## 2018-07-02 MED ORDER — albuterol (PROVENTIL;VENTOLIN;PROAIR) inhaler 1-2 puff
90 | RESPIRATORY_TRACT | Status: AC | PRN
Start: 2018-07-02 — End: 2018-07-02

## 2018-07-02 MED ORDER — hydrocortisone sod succ (PF)(SOLU-CORTEF) 100 mg injection
100 | Freq: Every day | INTRAMUSCULAR | Status: AC | PRN
Start: 2018-07-02 — End: 2018-07-02

## 2018-07-02 MED ORDER — sodium chloride 0.9 % infusion
Freq: Every day | INTRAVENOUS | Status: AC | PRN
Start: 2018-07-02 — End: 2018-07-02

## 2018-07-02 MED ORDER — PACLitaxel (TAXOL) 120 mg in sodium chloride 0.9 % 250 mL chemo infusion
6 | Freq: Once | INTRAVENOUS | Status: AC
Start: 2018-07-02 — End: 2018-07-02
  Administered 2018-07-02: 15:00:00 120 mg/m2 via INTRAVENOUS

## 2018-07-02 MED ORDER — dexAMETHasone (DECADRON) tablet 12 mg
4 | Freq: Once | ORAL | Status: AC
Start: 2018-07-02 — End: 2018-07-02
  Administered 2018-07-02: 14:00:00 12 mg via ORAL

## 2018-07-02 MED ORDER — proCHLORPERazine (COMPAZINE) tablet 10 mg
10 | Freq: Four times a day (QID) | ORAL | Status: AC | PRN
Start: 2018-07-02 — End: 2018-07-02

## 2018-07-02 MED FILL — DIPHENHYDRAMINE 25 MG CAPSULE: 25 25 mg | ORAL | Qty: 1

## 2018-07-02 MED FILL — PACLITAXEL 6 MG/ML CONCENTRATE,INTRAVENOUS: 6 6 mg/mL | INTRAVENOUS | Qty: 20

## 2018-07-02 MED FILL — PALONOSETRON 0.25 MG/5 ML INTRAVENOUS SOLUTION: 0.25 0.25 mg/5 mL | INTRAVENOUS | Qty: 5

## 2018-07-02 MED FILL — FAMOTIDINE (PF) 20 MG/2 ML INTRAVENOUS SOLUTION: 20 20 mg/2 mL | INTRAVENOUS | Qty: 2

## 2018-07-02 MED FILL — CARBOPLATIN 10 MG/ML INTRAVENOUS SOLUTION: 10 10 mg/mL | INTRAVENOUS | Qty: 48

## 2018-07-02 MED FILL — CINVANTI 7.2 MG/ML INTRAVENOUS EMULSION: 7.2 7.2 mg/mL | INTRAVENOUS | Qty: 18

## 2018-07-02 MED FILL — DEXAMETHASONE 4 MG TABLET: 4 4 MG | ORAL | Qty: 3

## 2018-07-02 NOTE — Unmapped (Addendum)
Managing Low Blood Counts During Cancer Treatment  Cancer treatments such as chemotherapy and radiation can sometimes cause a drop in the supply of blood cells in the body, including red blood cells, white blood cells, and platelets. These blood cells are produced in the body and are released into the blood to perform specific functions:  ?? Red blood cells carry gases such as oxygen and carbon dioxide to and from your lungs.  ?? White blood cells help protect you from infection.  ?? Platelets help your body to form blood clots to prevent and control bleeding.  When cancer treatments cause a drop in blood cell counts, your body may not have enough cells to keep up its normal functions. Symptoms or problems that may result will vary depending on which type of blood cells the treatment is affecting. If your blood counts are low, you can take steps to help manage any problems.  How can low blood counts affect me?  Low blood counts have various effects depending on the type of blood cells involved:  ?? If you have a low number of red blood cells, you have a condition called anemia. This can cause symptoms such as:  ?? Feeling tired and weak.  ?? Feeling light-headed.  ?? Being short of breath.  ?? If you have a low number of white blood cells, you may be at higher risk for infections.  ?? If you have a low number of platelets, you may bleed more easily, or your body may have trouble stopping any bleeding. You may also have more bruising.  How to manage symptoms or prevent problems from a low blood count  If you have a low blood count, you can take steps to manage symptoms or prevent problems that may develop. The steps to take will depend on which type of blood cell is low.  Low red blood cells  Take these steps to help manage the symptoms of anemia:  ?? Go for a walk or do some light exercise each day.  ?? Take short naps during the day.  ?? Eat foods that contain a lot of iron and protein. These include leafy vegetables, meat and  fish, beans, sweet potatoes, and dried fruit such as prunes, raisins, and apricots.  ?? Ask for help with errands and with work that needs to be done around the house. It is important to save your energy.  ?? Take vitamins or supplements--such as iron, vitamin B12, or folic acid--as told by your health care provider.  ?? Practice relaxation techniques, such as yoga or meditation.  Low white blood cells  Take these steps to help prevent infections:  ?? Wash your hands often with warm, soapy water.  ?? Avoid crowds of people and any person who has the flu or a fever.  ?? Take care when cleaning yourself after using the bathroom. Tell your health care provider if you have any rectal sores or bleeding.  ?? Avoid dental work. Check your mouth each day for sores or signs of infection.  ?? Do not share utensils.  ?? Avoid contact with pet waste. Wash your hands after handling pets.  ?? If you get a scrape or cut, clean it thoroughly right away.  ?? Avoid fresh plants or dried flowers.  ?? Do not swim or wade in lakes, ponds, rivers, water parks, or hot tubs.  ?? Follow food safety guidelines. Cook meat thoroughly and wash all raw fruits and vegetables.  ?? You may be instructed to   wear a mask when around others to protect yourself.  Low platelets  Take these steps to help prevent or control bleeding and bruising:  ?? Use an electric razor for shaving instead of a blade.  ?? Use a soft toothbrush and be careful during oral care. Talk with your cancer care team about whether you should avoid flossing. If your mouth is bleeding, rinse it with ice water.  ?? Avoid activities that could cause injury, such as contact sports.  ?? Talk with your health care provider about using laxatives or stool softeners to avoid constipation.  ?? Do not use medicines such as ibuprofen, aspirin, or naproxen unless your health care provider tells you to.  ?? Limit alcohol use.  ?? Monitor any bleeding closely. If you start bleeding, hold pressure on the area for 5  minutes to stop the bleeding. Bleeding that does not stop is considered an emergency.  What treatments can help increase a low blood count?  If needed, your health care provider may recommend treatment for a low blood count. Treatment will depend on the type of blood cell that is low and the severity of your condition. Treatment options may include:  ?? Taking medicines to help stimulate the growth of blood cells. This is an option for treating a low red blood cell count. Your health care provider may also recommend that you take iron, folic acid, or vitamin B12 supplements.  ?? Making dietary changes. Including more iron and protein in your diet can help stimulate the growth of red blood cells.  ?? Adjusting your current medicines to help raise blood counts.  ?? Making changes to your treatment plan.  ?? Having a blood transfusion. This may be done if your blood count is very low.  Contact a health care provider if:  ?? You feel extremely tired and weak.  ?? You have more bruising or bleeding.  ?? You feel ill or you develop a cough.  ?? You have swelling or redness.  ?? You have mouth sores or a sore throat.  ?? You have painful urination or you have blood in your urine or stool.  ?? You are thinking of taking any new supplements or vitamins or making dietary changes.  Get help right away if:  ?? You are short of breath, have chest pain, or feel dizzy.  ?? You have a fever or chills.  ?? You have abdominal pain or diarrhea.  ?? You have bleeding that will not stop.  Summary  ?? Cancer treatments such as chemotherapy and radiation can sometimes cause a drop in the supply of blood cells in the body, including red blood cells, white blood cells, and platelets.  ?? If you have a low blood count, you can take steps to manage symptoms or prevent problems that may develop.  ?? Depending on which type of blood cell is low, you may need to take steps to prevent infection, prevent bleeding, or manage symptoms that may develop.  ?? If needed,  your health care provider may recommend treatment for a low blood count.  This information is not intended to replace advice given to you by your health care provider. Make sure you discuss any questions you have with your health care provider.  Document Released: 07/19/2016 Document Revised: 07/19/2016 Document Reviewed: 07/19/2016  Elsevier Interactive Patient Education ?? 2018 [employer]

## 2018-07-02 NOTE — Unmapped (Signed)
Lab draw from Venous Access Device

## 2018-07-02 NOTE — Nursing Note (Addendum)
Patient arrived ambulatory for Taxol C4, D1, daughter present.  Patient requesting oral benadryl vs intravenous.  No new healthcare complaints - denies neuropathy, nausea, vomiting, eating and drinking okay.  Some SOB and fatigue especially with exertion.      Lab Results   Component Value Date    WBC 2.8 (L) 07/02/2018    RBC 2.26 (L) 07/02/2018    HGB 8.9 (L) 07/02/2018    HCT 24.5 (L) 07/02/2018    MCV 108.1 (H) 07/02/2018    MCH 39.1 (H) 07/02/2018    MCHC 36.2 (H) 07/02/2018    RDW 19.7 (H) 07/02/2018    PLT 150 07/02/2018     ANC 1.484    Per [facility], NP/physician, ok to treat and placed in order.  Neulasta to be added to next cycle plan.  Patient education provided on managing low blood counts, strict handwashing, reporting a fever of 100.4 or greater, etc.  AVS also has information on low blood counts.  Provider Tery Sanfilippo, NP,  spoke with patient and daughter chairside.  Tentative plan is that Neulasta will be added to Cycle 15.  Patient education on Neulasta and she states she would prefer coming in for the injection since she lives 15 minutes away vs the auto-injector.  Left in stable condition with daughter who was also present with patient entire visit.    AVS provided with information and next appt.

## 2018-07-06 NOTE — Telephone Encounter (Addendum)
The patient called and stated that she's in  a lot of pain. She stated that the pain is in her  Abdominal area  . She stated that she spoke with someone in the clinic earlier today and was given instructions on what to do for the pain. The patient said that she has tried taking medication and and she is still in pain. I paged the on call doctor and will route the call when contacted by the doctor.    The on call doctor Dr.Reddy called back and gave me instruction  to pass on to the patient.

## 2018-07-07 ENCOUNTER — Ambulatory Visit: Admit: 2018-07-07 | Payer: Medicare (Managed Care)

## 2018-07-07 ENCOUNTER — Other Ambulatory Visit: Admit: 2018-07-07 | Payer: Medicare (Managed Care)

## 2018-07-07 ENCOUNTER — Inpatient Hospital Stay
Admit: 2018-07-07 | Discharge: 2018-07-14 | Disposition: A | Discharge status: Home / Self Care | Payer: Medicare (Managed Care) | Source: Ambulatory Visit

## 2018-07-07 ENCOUNTER — Inpatient Hospital Stay: Admit: 2018-07-07 | Payer: Medicare (Managed Care)

## 2018-07-07 DIAGNOSIS — C561 Malignant neoplasm of right ovary: Secondary | ICD-10-CM

## 2018-07-07 DIAGNOSIS — K56609 Unspecified intestinal obstruction, unspecified as to partial versus complete obstruction: Principal | ICD-10-CM

## 2018-07-07 DIAGNOSIS — C569 Malignant neoplasm of unspecified ovary: Secondary | ICD-10-CM

## 2018-07-07 LAB — COMPREHENSIVE METABOLIC PANEL
ALT: 14 U/L (ref 7–52)
AST: 22 U/L (ref 13–39)
Albumin: 4.5 g/dL (ref 3.5–5.7)
Alkaline Phosphatase: 52 U/L (ref 36–125)
Anion Gap: 6 mmol/L (ref 3–16)
BUN: 14 mg/dL (ref 7–25)
CO2: 29 mmol/L (ref 21–33)
Calcium: 9.9 mg/dL (ref 8.6–10.3)
Chloride: 94 mmol/L (ref 98–110)
Creatinine: 0.55 mg/dL (ref 0.60–1.30)
Glucose: 147 mg/dL (ref 70–100)
Osmolality, Calculated: 271 mOsm/kg (ref 278–305)
Potassium: 4.3 mmol/L (ref 3.5–5.3)
Sodium: 129 mmol/L (ref 133–146)
Total Bilirubin: 1 mg/dL (ref 0.0–1.5)
Total Protein: 6.8 g/dL (ref 6.4–8.9)
eGFR AA CKD-EPI: >90 See note.
eGFR NONAA CKD-EPI: >90 See note.

## 2018-07-07 LAB — URINALYSIS W/RFL MICROSCOP, RFL CULTURE
Bilirubin, UA: NEGATIVE
Blood, UA: NEGATIVE
Glucose, UA: NEGATIVE mg/dL
Ketones, UA: 20 mg/dL
Leukocytes, UA: NEGATIVE
Nitrite, UA: NEGATIVE
Protein, UA: NEGATIVE mg/dL
Specific Gravity, UA: 1.013 (ref 1.005–1.035)
Urobilinogen, UA: <2 mg/dL (ref 0.2–1.9)
pH, UA: 8 (ref 5.0–8.0)

## 2018-07-07 LAB — CBC
Hematocrit: 26.1 % (ref 35.0–45.0)
Hemoglobin: 9.4 g/dL (ref 11.7–15.5)
MCH: 39.7 pg (ref 27.0–33.0)
MCHC: 35.9 g/dL (ref 32.0–36.0)
MCV: 110.6 fL (ref 80.0–100.0)
MPV: 7.8 fL (ref 7.5–11.5)
Platelets: 261 10*3/uL (ref 140–400)
RBC: 2.36 10*6/uL (ref 3.80–5.10)
RDW: 18.2 % (ref 11.0–15.0)
WBC: 3 10*3/uL (ref 3.8–10.8)

## 2018-07-07 LAB — DIFFERENTIAL
Basophils Absolute: 9 /uL (ref 0–200)
Basophils Relative: 0.3 % (ref 0.0–1.0)
Eosinophils Absolute: 9 /uL (ref 15–500)
Eosinophils Relative: 0.3 % (ref 0.0–8.0)
Lymphocytes Absolute: 840 /uL (ref 850–3900)
Lymphocytes Relative: 28 % (ref 15.0–45.0)
Monocytes Absolute: 93 /uL (ref 200–950)
Monocytes Relative: 3.1 % (ref 0.0–12.0)
Neutrophils Absolute: 2049 /uL (ref 1500–7800)
Neutrophils Relative: 68.3 % (ref 40.0–80.0)
nRBC: 0 /100 WBC (ref 0–0)

## 2018-07-07 MED ORDER — alteplase (CATHFLO) injection 2 mg
2 | Freq: Once | Status: AC
Start: 2018-07-07 — End: 2018-07-08

## 2018-07-07 MED ORDER — ondansetron (ZOFRAN) injection 4 mg
4 | Freq: Once | INTRAMUSCULAR | Status: AC
Start: 2018-07-07 — End: 2018-07-07
  Administered 2018-07-07: 18:00:00 4 mg via INTRAVENOUS

## 2018-07-07 MED ORDER — enoxaparin (LOVENOX) for prophylaxis syringe 40 mg/0.4 mL
40 | Freq: Every day | SUBCUTANEOUS | Status: AC
Start: 2018-07-07 — End: 2018-07-07

## 2018-07-07 MED ORDER — ondansetron (ZOFRAN) tablet 4 mg
4 | Freq: Three times a day (TID) | ORAL | Status: AC | PRN
Start: 2018-07-07 — End: 2018-07-14

## 2018-07-07 MED ORDER — sodium chloride 0.9 % infusion
INTRAVENOUS | Status: AC
Start: 2018-07-07 — End: 2018-07-07
  Administered 2018-07-07: 18:00:00 999 mL/h via INTRAVENOUS

## 2018-07-07 MED ORDER — HYDROmorphone (DILAUDID) injection Syrg 0.5 mg
0.5 | INTRAMUSCULAR | Status: AC | PRN
Start: 2018-07-07 — End: 2018-07-09
  Administered 2018-07-08: 20:00:00 0.5 mg via INTRAVENOUS

## 2018-07-07 MED ORDER — ibuprofen (ADVIL,MOTRIN) tablet 600 mg
200 | Freq: Four times a day (QID) | ORAL | Status: AC | PRN
Start: 2018-07-07 — End: 2018-07-07

## 2018-07-07 MED ORDER — morphine injection 2 mg
4 | Freq: Once | INTRAVENOUS | Status: AC
Start: 2018-07-07 — End: 2018-07-07
  Administered 2018-07-07: 20:00:00 2 mg via INTRAVENOUS

## 2018-07-07 MED ORDER — ketorolac (TORADOL) injection 30 mg
30 | Freq: Four times a day (QID) | INTRAMUSCULAR | Status: AC | PRN
Start: 2018-07-07 — End: 2018-07-10
  Administered 2018-07-08 – 2018-07-10 (×5): 15 mg via INTRAVENOUS

## 2018-07-07 MED ORDER — pantoprazole (PROTONIX) EC tablet 40 mg
40 | Freq: Every day | ORAL | Status: AC
Start: 2018-07-07 — End: 2018-07-08
  Administered 2018-07-08: 09:00:00 40 mg via ORAL

## 2018-07-07 MED ORDER — magnesium hydroxide (MILK OF MAGNESIA) 2,400 mg/10 mL oral suspension 10 mL
2400 | Freq: Every day | ORAL | Status: AC | PRN
Start: 2018-07-07 — End: 2018-07-07

## 2018-07-07 MED ORDER — alteplase 1mg/mL in sterile water (ACTIVASE) 1 mg in sterile water (PF) syringe for line care
100 | Freq: Once | INTRAVENOUS | Status: AC | PRN
Start: 2018-07-07 — End: 2018-07-07
  Administered 2018-07-07: 16:00:00 1 mg

## 2018-07-07 MED ORDER — HYDROmorphone (DILAUDID) injection Syrg 1 mg
1 | INTRAMUSCULAR | Status: AC | PRN
Start: 2018-07-07 — End: 2018-07-09
  Administered 2018-07-08: 13:00:00 1 mg via INTRAVENOUS

## 2018-07-07 MED ORDER — OMNIPAQUE (iohexol) 350 mg iodine/mL 150 mL
350 | Freq: Once | INTRAVENOUS | Status: AC | PRN
Start: 2018-07-07 — End: 2018-07-07
  Administered 2018-07-08: 100 mL via INTRAVENOUS

## 2018-07-07 MED ORDER — HYDROmorphone (DILAUDID) injection Syrg 0.5 mg
0.5 | INTRAMUSCULAR | Status: AC
Start: 2018-07-07 — End: 2018-07-07
  Administered 2018-07-07: 23:00:00 0.5 mg via INTRAVENOUS

## 2018-07-07 MED ORDER — OMNIPAQUE (iohexol) 240 mg iodine/mL 50 mL
240 | Freq: Once | INTRAVENOUS | Status: AC | PRN
Start: 2018-07-07 — End: 2018-07-07
  Administered 2018-07-07: 50 mL via ORAL

## 2018-07-07 MED ORDER — acetaminophen (TYLENOL) tablet 650 mg
325 | ORAL | Status: AC | PRN
Start: 2018-07-07 — End: 2018-07-08
  Administered 2018-07-08: 09:00:00 650 mg via ORAL

## 2018-07-07 MED ORDER — proCHLORPERazine (COMPAZINE) tablet 10 mg
10 | Freq: Four times a day (QID) | ORAL | Status: AC | PRN
Start: 2018-07-07 — End: 2018-07-14

## 2018-07-07 MED ORDER — lactated Ringers infusion
INTRAVENOUS | Status: AC
Start: 2018-07-07 — End: 2018-07-08
  Administered 2018-07-07: 22:00:00 75 mL/h via INTRAVENOUS

## 2018-07-07 MED ORDER — ondansetron (ZOFRAN) injection 4 mg
4 | Freq: Three times a day (TID) | INTRAMUSCULAR | Status: AC | PRN
Start: 2018-07-07 — End: 2018-07-14
  Administered 2018-07-07 – 2018-07-11 (×4): 4 mg via INTRAVENOUS

## 2018-07-07 MED FILL — DILAUDID (PF) 0.5 MG/0.5 ML INJECTION SYRINGE: 0.5 0.5 mg/0.5 mL | INTRAMUSCULAR | Qty: 0.5

## 2018-07-07 MED FILL — ACTIVASE 100 MG INTRAVENOUS SOLUTION: 100 100 mg | INTRAVENOUS | Qty: 1

## 2018-07-07 MED FILL — MORPHINE 4 MG/ML INTRAVENOUS SOLUTION: 4 4 mg/mL | INTRAVENOUS | Qty: 1

## 2018-07-07 MED FILL — OMNIPAQUE 350 MG IODINE/ML INTRAVENOUS SOLUTION: 350 350 mg iodine/mL | INTRAVENOUS | Qty: 150

## 2018-07-07 MED FILL — KETOROLAC 30 MG/ML (1 ML) INJECTION SOLUTION: 30 30 mg/mL (1 mL) | INTRAMUSCULAR | Qty: 1

## 2018-07-07 MED FILL — ONDANSETRON HCL (PF) 4 MG/2 ML INJECTION SOLUTION: 4 4 mg/2 mL | INTRAMUSCULAR | Qty: 2

## 2018-07-07 MED FILL — OMNIPAQUE 240 MG IODINE/ML INTRAVENOUS SOLUTION: 240 240 mg iodine/mL | INTRAVENOUS | Qty: 50

## 2018-07-07 NOTE — Nursing Note (Signed)
Request for transportation via wheelchair to [id number] called to [phone].

## 2018-07-07 NOTE — H&P (Signed)
Gynecologic Oncology History and Physical    Chief complaint: No chief complaint on file.      HPI: Kelsey Sullivan is 77 y.o. 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 She declined adjuvant therapy. She used natural methods, went to a raw diet for a year, etc. She developed a small bowel obstruction in 03/2017, resolved on its own, with NED at that time. In 01/2018, she experienced mid abdominal pain, and seen by PCP and ED, with CT of abdomen and pelvis showing interval development of necrotic retroperitoneal lymphadenopathy with largest node measuring 4.9 4.2 x 4.8 cm. Her CA 125 on 02/18/18 was 197.8. She underwent a ct-guided nodal biopsy on 02/26/18, with pathology confirming recurrent disease. 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 She was seen at [facility] in [place] with Dr. Ishmael Holter. She is undergoing chemotherapy with Carboplatin and Paclitaxel-weekly.       INTERVAL HISTORY:  Yesterday she experienced abdominal pain that was sharp on her LLQ and had significant diarrhea. The pain waxes and wanes, and does not radiate. It is now a 4 but was once an 8/10. She attributes it to eating a variety of fruits and vegetables. She is experiencing intermittent nausea, and this episodes reminds her of her small bowel obstruction incident in 2018. She denies fevers, chills, shortness of breath, dysuria, and leg pain.    She  was give IL NS, 2mg  morphine IV, and 4mg  Zofran IV in clinic.      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 Pt declined adjuvant therapy      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     Posterior cul de sac biopsy-metastatic adenocarcinoma.     Omentectomy- metastatic adenocarcinoma     LND-negative     Residual right IP ligament, Anterior cul de sac biopsy, right pelvic side will biopsy, left pelvic side wall, right gutter and left gutter-->benign    04/06/2017:   CT [name]- mild small bowel obstruction with transition point in the left pelvis adjacent to the anterior abdominal wall. Low density in the common femoral veins bilaterally thought likely to represent flow artificat, may consider follow up with lomer extremity Doppler to ensure there is no underlying deep venous  thrombosis.    01/29/2018:   CTAP:  Interval development of necrotic retroperitoneal lymphadenopathy with largest node measuring 4.9  4.2 x 4.8 cm. Origin is indeterminate.  Options would include CT directed biopsy as well as PET/CT.    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  No other focal areas of FD avid malignancy are found. Specifically, no lymph node activity is found outside of the retroperitoneum and there is no evidence for FDG avid pulmonary, skeletal or hepatic parenchymal metastasis.    02/2018:   Patient saw Dr. Ishmael Holter who recommended Carbo/Taxol with an AUC of 6.  Dr. Maren Reamer office submitted foundation one testing. Patient would like Genetic Testing    04/12/2018:   IR port placed    04/16/2018:   C1 weekly taxol 80 mg/m2 D1,8,15, Carboplatin AUC 6 D1 q 21 days. PCEs on thurs, txt on fridays. EXAMS ON EVEN CYCLES     CA 125: 523.4     Genetic testing: Neg for CS mutation, no VUS, lifetime remaining breast cancer risk -3.4%    05/07/2018:   C2 weekly taxol 80 mg/m2 D1,8,15, Carboplatin AUC 6 D1 q 21 days. PCEs on thurs, txt on fridays. EXAMS ON EVEN CYCLES     CA 125: 162.2    05/20/2018:   Early PCE due to scheduling, due 6/21    05/28/2018  Delay C3 due to low ANC     CA 125: 25    06/04/2018  C3 weekly taxol 80 mg/m2 D1,8,15, Carboplatin AUC 6 D1 q 21 days. EXAMS ON EVEN CYCLES.      CA 125    06/25/2018:   HOLD chemo due to low anc (1047)- defer to next week, and change to q 28 days cycle to build in an off week to allow for counts to recover     CA 125: 10.6    07/02/2018:   C4 weekly taxol 80 mg/m2 D1,8,15, Carboplatin AUC 6 D1 q 28 days. ANC still lowish 1480s- start neulasta Day 16      EXAMS ON EVEN CYCLES.  Review her genetics testing results      CA 125 7.2      Disposition: chemo, imaging after 3 cucles, CA 125, possible parp maintenance after, genetic testing pending, foundation one testing results from [facility] in [place]  Current disease status: Neg for CS mutation, no VUS, lifetime remaining breast cancer risk -3.4%  Genetics: negative for mutations, breast cancer lifetime risk 3.4%  Survivorship plan:  pending completion of treatment              Past Medical History:   Diagnosis Date    Bowel obstruction (CMS Dx) 2018    Ovarian cancer (CMS Dx) 2014       Past Surgical History:   Procedure Laterality Date    ABDOMINOPLASTY  1999    BLEPHAROPLASTY  2000    DILATION AND CURETTAGE OF UTERUS  1979    ELBOW SURGERY  1990    EYE SURGERY  1994    KNEE ARTHROPLASTY Right  ROTATOR CUFF REPAIR      TRANSUMBILICAL AUGMENTATION MAMMAPLASTY      VAGINAL HYSTERECTOMY  1979       Social History     Social History    Marital status: Divorced     Spouse name: N/A    Number of children: N/A    Years of education: N/A     Occupational History    Not on file.     Social History Main Topics    Smoking status: Former Smoker    Smokeless tobacco: Never Used    Alcohol use Yes      Comment: 2-3 drinks a day    Drug use: No    Sexual activity: Not on file     Other Topics Concern    Caffeine Use No    Occupational Exposure No    Exercise Yes    Seat Belt Yes     Social History Narrative    Mammogram-6 years ago and it was normal. Pt stated she isn't getting them anymore.      Colonoscopy-10 years ago        Pt reports she can lie flat in bed without SOB.    Pt states she can walk a flight of stairs and city block without chest pain and SOB>         No family history on file.    Current Facility-Administered Medications   Medication Dose Frequency Provider Last Dose    acetaminophen  650 mg Q4H PRN Mauri Brooklyn, MD      [START ON 07/08/2018] enoxaparin  40 mg Daily 0900 Mauri Brooklyn, MD      HYDROmorphone  0.5 mg Q3H Mauri Brooklyn, MD 0.5 mg at 07/07/18 1610    ibuprofen  600 mg Q6H PRN Mauri Brooklyn, MD      lactated Ringers  75 mL/hr Continuous Mauri Brooklyn, MD 75 mL/hr at 07/07/18 1744    ondansetron  4 mg Q8H PRN Mauri Brooklyn, MD      Or    ondansetron  4 mg Q8H PRN Mauri Brooklyn, MD      [START ON 07/08/2018] pantoprazole  40 mg DAILY 0600  Mauri Brooklyn, MD      proCHLORPERazine  10 mg Q6H PRN Mauri Brooklyn, MD         Allergies   Allergen Reactions    Bactrim [Sulfamethoxazole-Trimethoprim] Other (See Comments)     Hallucinations, anxiety    Nsaids (Non-Steroidal Anti-Inflammatory Drug)      Stomach ache    Celecoxib Nausea And Vomiting    Metoclopramide Other (See Comments)     Does not remember         O:  Vitals:    07/07/18 1620   BP: 153/73   BP Location: Left arm   Patient Position: Sitting   Pulse: 81   Resp: 15   Temp: 98.1 F (36.7 C)   TempSrc: Oral   SpO2: 98%   Weight: 121 lb 8 oz (55.1 kg)   Height: 5' 4 (1.626 m)       Gen: NAD  CV: regular rate, well perfused  Resp: moving air well, no respiratory distress  Abd: soft, non-tender, non-distended, active bowel sounds  Gu: defferred  Ext: strong peripheral pulses, no edema      Labs:  Lab Results   Component Value Date    WBC 3.0 (L) 07/07/2018    HGB 9.4 (L) 07/07/2018  HCT 26.1 (L) 07/07/2018    MCV 110.6 (H) 07/07/2018    PLT 261 07/07/2018     Lab Results   Component Value Date    CREATININE 0.55 (L) 07/07/2018    BUN 14 07/07/2018    NA 129 (L) 07/07/2018    K 4.3 07/07/2018    CL 94 (L) 07/07/2018    CO2 29 07/07/2018       Imaging:  KUB 7/31  FINDINGS:  There is distention of the small bowel with multiple air-fluid levels on the upright view. A fluid and gas level is present in the gastric fundus. Gas appears to extend to the level of the rectum. There are postsurgical changes in the pelvis. Underlying organ shadows are grossly normal.    IMPRESSION:  Nonspecific bowel gas pattern. The presence of multiple air-fluid levels in small bowel raises question of early or partial small bowel obstruction.      [name]: Kelsey Sullivan is 77 y.o. 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   -Used natural methods, went to a raw diet for a year, etc.   -Developed a small bowel obstruction in 03/2017, resolved on its own, with NED   -In 01/2018, she experienced mid abdominal pain, and seen by PCP and ED, with CT of abdomen and pelvis showing interval development of necrotic retroperitoneal lymphadenopathy with largest node measuring 4.9 4.2 x 4.8 cm.   -Her CA 125 = 197.8 02/18/18. 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 Labs today, no evidence of neutropenia (ANC >2000)    Anemia  - H/H 9.4/ 26.1  - Will continue to monitor    Endocrine  No active concerns    Neuro  Pain Control  - IV dilaudid 0.5mg  q3hrs  - Tylenol and ibuprofen PRN    MSK  No active concerns    ATTENDING ADDENDUM    I saw and personally examined the patient today, 07/07/2018. I discussed the findings and therapeutic plan with the patient. I repeated, reviewed and and agree with the history, physical examination, and medical decisions as outlined in Dr.Calhoun's note (SEE ABOVE).   I have made changes as required to the medical record above.      Warner Mccreedy, MD    Division of Gynecologic Oncology  [phone]  Prophylaxis  DVT  - SCDs  - possibly start lovenox tomorrow    Dispo:  inpatient    Vergie Living, MD  Ob/Gyn PGY-1    GYN ONC pager [phone]

## 2018-07-07 NOTE — Unmapped (Addendum)
Arrived ambulatory for IVFs and antiemetics.  Pt reports having abdominal pain since yesterday.  States she had explosive bowel movement yesterday.  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  On Adm to infusion suite patient did not want any meds that caused drowsiness.  Zofran was held initially.  Following decision to admit, patient requested pain meds and zofran.  Order obtained for morphine.  Both meds administered as ordered.  Report was given to [facility], RN on 9CCP.

## 2018-07-07 NOTE — Progress Notes (Signed)
Pt admitted to room 9011 VSS and assessment completed on arrival to floor. Pt oriented to room, call light and bed controls. Pt also oriented to unit policies. RN paged primary team GYN/ONC to make aware of pt's arrival to the floor. Pt was directly admitted from [facility] with portacath accessed. Pt rating pain at a 7/10 in abdomen. Pt is resting comfortably with call light and all needs within reach. RN will continue to monitor.

## 2018-07-07 NOTE — Unmapped (Signed)
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 She is a patient of Dr [name].  ??  Ms. 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 She declined adjuvant therapy. She used natural methods, went to a raw diet for a year, etc. She developed a small bowel obstruction in 03/2017, resolved on its own, with NED at that time. In 01/2018, she experienced mid abdominal pain, and seen by PCP and ED, with CT of abdomen and pelvis showing interval development of necrotic retroperitoneal lymphadenopathy with largest node measuring 4.9  4.2 x 4.8 cm. Her CA 125 on 02/18/18 was 197.8.  She underwent a ct-guided nodal biopsy on 02/26/18, with pathology confirming recurrent disease.  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 She was seen at [facility] in [place] with Dr. Ishmael Holter. She is undergoing chemotherapy with Carboplatin and Paclitaxel-weekly.    ??      INTERVAL HISTORY:  Yesterday she experienced abdominal pain that was sharp on her LLQ and had significant diarrhea. The pain waxes and wanes, and does not radiate. It is now a 4 but was once an 8/10. She attributes it to eating a variety of fruits and vegetables. She is experiencing intermittent nausea, and this episodes reminds her of her small bowel obstruction incident in 2018. She denies fevers, chills, shortness  of breath, dysuria, and leg pain.      PAST MEDICAL/SURGICAL HX:  Past Medical History:   Diagnosis Date   ??? Bowel obstruction (CMS Dx) 2018   ??? Ovarian cancer (CMS Dx) 2014         Past Surgical History  Past Surgical History:   Procedure Laterality Date   ??? ABDOMINOPLASTY  1999   ??? BLEPHAROPLASTY  2000   ??? DILATION AND CURETTAGE OF UTERUS  1979   ??? ELBOW SURGERY  1990   ??? EYE SURGERY  1994   ??? KNEE ARTHROPLASTY Right    ??? ROTATOR CUFF REPAIR     ??? TRANSUMBILICAL AUGMENTATION MAMMAPLASTY     ??? VAGINAL HYSTERECTOMY  1979           Family History  History reviewed. No pertinent family history.    Social History  Social History     Social History   ??? Marital status: Divorced     Spouse name: Kelsey Sullivan   ??? Number of children: Kelsey Sullivan   ??? Years of education: Kelsey Sullivan     Social History Main Topics   ??? Smoking status: Former Smoker   ??? Smokeless tobacco: Never Used   ??? Alcohol use Yes      Comment: 2-3 drinks a day   ??? Drug use: No   ??? Sexual activity: Not Asked     Other Topics Concern   ??? Caffeine Use No   ??? Occupational Exposure No   ???  Exercise Yes   ??? Seat Belt Yes     Social History Narrative    Mammogram-6 years ago and it was normal. Pt stated she isn't getting them anymore.      Colonoscopy-10 years ago        Pt reports she can lie flat in bed without SOB.    Pt states she can walk a flight of stairs and city block without chest pain and SOB>             CURRENT MEDICATIONS:  No current facility-administered medications for this visit.   No current outpatient prescriptions on file.    Facility-Administered Medications Ordered in Other Visits:   ???  acetaminophen (TYLENOL) tablet 650 mg, 650 mg, Oral, Q4H PRN, Mauri Brooklyn, MD  ???  [START ON 07/08/2018] enoxaparin (LOVENOX) for prophylaxis syringe 40 mg/0.4 mL, 40 mg, Subcutaneous, Daily 0900, Mauri Brooklyn, MD  ???  lactated Ringers infusion, 75 mL/hr, Intravenous, Continuous, Mauri Brooklyn, MD, Last Rate: 75 mL/hr at 07/07/18 1744, 75 mL/hr at 07/07/18  1744  ???  magnesium hydroxide (MILK OF MAGNESIA) 2,400 mg/10 mL oral suspension 10 mL, 10 mL, Oral, Daily PRN, Mauri Brooklyn, MD  ???  ondansetron [facility]) tablet 4 mg, 4 mg, Oral, Q8H PRN **OR** ondansetron (ZOFRAN) injection 4 mg, 4 mg, Intravenous, Q8H PRN, Mauri Brooklyn, MD    ALLERGIES  Allergies   Allergen Reactions   ??? Bactrim [Sulfamethoxazole-Trimethoprim] Other (See Comments)     Hallucinations, anxiety   ??? Nsaids (Non-Steroidal Anti-Inflammatory Drug)      Stomach ache   ??? Celecoxib Nausea And Vomiting   ??? Metoclopramide Other (See Comments)     Does not remember         REVIEW OF SYSTEMS:  Complete 10-system review is negative except abdominal pain as above in Interval History.      PHYSICAL EXAM:  Vitals:    07/07/18 1032   BP: 129/71   Pulse: 87   Resp: 16   Temp: 97.8 ??F (36.6 ??C)   SpO2: 100%     General: Alert, oriented, no acute distress.  HEENT: Posterior oropharynx clear, sclera anicteric.  Chest: Clear to auscultation bilaterally.  Port site clean. No work of breathing.  Cardiovascular: Regular rate and rhythm, no murmurs. Well perfused  Abdomen: soft, tenderness in RLQ. Active bowel sounds. No guarding.  Extremities: Grossly normal range of motion. Warm, well perfused. No edema bilaterally.  Skin: No rashes or lesions noted.  GU: deferred    LABORATORY AND RADIOLOGIC STUDIES:  KUB 7/31  FINDINGS:  There is distention of the small bowel with multiple air-fluid levels on the upright view. A fluid and gas level is present in the gastric fundus. Gas appears to extend to the level of the rectum.  There are postsurgical changes in the pelvis. Underlying organ shadows are grossly normal.    IMPRESSION:  Nonspecific bowel gas pattern. The presence of multiple air-fluid levels in small bowel raises question of early or partial small bowel obstruction.          Lab Results   Component Value Date    WBC 3.0 (L) 07/07/2018    RBC 2.36 (L) 07/07/2018    HGB 9.4 (L) 07/07/2018    HCT 26.1 (L)  07/07/2018    MCV 110.6 (H) 07/07/2018    MCH 39.7 (H) 07/07/2018    MCHC 35.9 07/07/2018    RDW 18.2 (H) 07/07/2018    PLT 261 07/07/2018  ANC 2,049      Na 129  K 4.3  Cl 94  CO2 29  Bun 14  Cr 0.55  Glucose 147      ASSESSMENT AND PLAN:  Kelsey Sullivan is a 77 y.o. 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    Today, she was called the office about recent abdominal pain, nausea, and diarrhea. She was seen urgently, given 1L NS, 2mg  morphine IV, and 4mg  Zofran IV. A KUB was obtained, and it showed potential small bowel obstruction and she has experienced recent nausea. The decision was made to admit her for observation and conservative management of a small bowel obstruction.     A CT of the abdomen and pelvis will be obtained on admission.     Vergie Living, MD  Ob/Gyn PGY-1    I saw and personally examined the patient today with my resident Dr. Woody Seller.     I discussed with the resident and agree with resident's findings and plan as documented in resident's  note.   I saw and independently examined the patient today. I agree with the history of present illness, past medical histories, family history, social history, medication list, and allergies as listed. The review of systems is as noted above. My physical exam confirms the findings listed above. Review of labs, pathology reports, radiograph reports, and medical records confirm the findings noted above. I agree with the assessment and plan as noted above. I have edited the note where appropriate. (SEE ABOVE)    Complexity: admitted    Face-to-face time today was 20 minutes, of which >50% was spent in care coordination, documentation and counseling.      Warner Mccreedy, MD    Division of Gynecologic Oncology  [phone]

## 2018-07-07 NOTE — Unmapped (Addendum)
1045 am - Port accessed per protocol without difficulty, no blood return noted. Labs drawn peripherally as ordered.  See flow sheet for details. Meg RN notified.  1135 am - cath flow instilled  1323 pm - patient went to xray, cath flow removed, positive blood return, port flushed with NS.

## 2018-07-07 NOTE — Unmapped (Signed)
Pt transported to 9011 by wheelchair, with PAC accessed.  Pt stated pain was relieved to 2/10.

## 2018-07-07 NOTE — Telephone Encounter (Signed)
Pt was called has I received call from operator that pt has abdominal pain. I called the pt back but patient did not answer the phone. I informed Zenon Mayo Dia to inform the patient if she has not any bowel movement, is  not passing any flatus, or  if her pain is not relieved with tylenol or ibuprofen  She needs come in for evaluation . I discussed with Dr. Jenita Seashore as well.    Sandria Manly, MD  OB/GYN  PGY-2

## 2018-07-07 NOTE — Telephone Encounter (Signed)
Pt called in this morning to report she thinks she has a bowel obstruction.  Stated that last night her stomach starting hurting so bad and then she vomitted twice and has been very nauseous. She stated she ate a cucumber and onions and thought it happened as a result of this. STated that she has passed gas one time but needs something to get her peistalsis going. She stated she started to come to the ED last night but her pain medication finally kicked in and her stomach felt better. Advised patient that given this and her diarrhea from yesterday, we should have her come in to be evaluated. Pt stated understanding and will come in in one hour.

## 2018-07-08 ENCOUNTER — Inpatient Hospital Stay: Admit: 2018-07-08 | Payer: Medicare (Managed Care)

## 2018-07-08 ENCOUNTER — Inpatient Hospital Stay: Payer: Medicare (Managed Care)

## 2018-07-08 LAB — DIFFERENTIAL
Basophils Absolute: 3 /uL (ref 0–200)
Basophils Relative: 0.4 % (ref 0.0–1.0)
Eosinophils Absolute: 2 /uL (ref 15–500)
Eosinophils Relative: 0.2 % (ref 0.0–8.0)
Lymphocytes Absolute: 208 /uL (ref 850–3900)
Lymphocytes Relative: 26 % (ref 15.0–45.0)
Monocytes Absolute: 65 /uL (ref 200–950)
Monocytes Relative: 8.1 % (ref 0.0–12.0)
Neutrophils Absolute: 522 /uL (ref 1500–7800)
Neutrophils Relative: 65.3 % (ref 40.0–80.0)
nRBC: 0 /100 WBC (ref 0–0)

## 2018-07-08 LAB — BASIC METABOLIC PANEL
Anion Gap: 6 mmol/L (ref 3–16)
BUN: 17 mg/dL (ref 7–25)
CO2: 27 mmol/L (ref 21–33)
Calcium: 8.9 mg/dL (ref 8.6–10.3)
Chloride: 97 mmol/L — ABNORMAL LOW (ref 98–110)
Creatinine: 0.63 mg/dL (ref 0.60–1.30)
Glucose: 144 mg/dL — ABNORMAL HIGH (ref 70–100)
Osmolality, Calculated: 274 mosm/kg — ABNORMAL LOW (ref 278–305)
Potassium: 4 mmol/L (ref 3.5–5.3)
Sodium: 130 mmol/L — ABNORMAL LOW (ref 133–146)
eGFR AA CKD-EPI: >90 See note.
eGFR NONAA CKD-EPI: 87 See note.

## 2018-07-08 LAB — CBC
Hematocrit: 22.6 % (ref 35.0–45.0)
Hemoglobin: 8.1 g/dL (ref 11.7–15.5)
MCH: 39.2 pg (ref 27.0–33.0)
MCHC: 35.7 g/dL (ref 32.0–36.0)
MCV: 109.8 fL (ref 80.0–100.0)
MPV: 7.6 fL (ref 7.5–11.5)
Platelets: 193 10*3/uL (ref 140–400)
RBC: 2.06 10*6/uL (ref 3.80–5.10)
RDW: 17.7 % (ref 11.0–15.0)
WBC: 0.8 10*3/uL (ref 3.8–10.8)

## 2018-07-08 LAB — URINALYSIS W/RFL MICROSCOP, RFL CULTURE
Bilirubin, UA: NEGATIVE
Blood, UA: NEGATIVE
Glucose, UA: NEGATIVE mg/dL
Ketones, UA: 5 mg/dL — AB
Leukocyte Esterase, UA: NEGATIVE
Nitrite, UA: NEGATIVE
Protein, UA: 30 mg/dL — AB
RBC, UA: 2 /HPF (ref 0–3)
Specific Gravity, UA: >1.06 — ABNORMAL HIGH (ref 1.005–1.035)
Squam Epithel, UA: 3 /HPF (ref 0–5)
Urobilinogen, UA: <2 mg/dL (ref 0.2–1.9)
WBC, UA: 3 /HPF (ref 0–5)
pH, UA: 5 (ref 5.0–8.0)

## 2018-07-08 LAB — BLOOD CULTURE-PERIPHERAL
Culture Result: NO GROWTH
Culture Result: NO GROWTH

## 2018-07-08 LAB — URINE CULTURE, SPECIAL POPULATION

## 2018-07-08 LAB — PHOSPHORUS: Phosphorus: 2.9 mg/dL (ref 2.1–4.5)

## 2018-07-08 LAB — BLOOD CULTURE LINE DRAW: Culture Result: NO GROWTH

## 2018-07-08 LAB — MAGNESIUM: Magnesium: 1.5 mg/dL (ref 1.5–2.5)

## 2018-07-08 MED ORDER — proMETHazine (PHENERGAN) injection 12.5 mg
25 | Freq: Once | INTRAMUSCULAR | Status: AC
Start: 2018-07-08 — End: 2018-07-08
  Administered 2018-07-08: 05:00:00 12.5 mg via INTRAVENOUS

## 2018-07-08 MED ORDER — phenol (SORE THROAT) 1.4 % spray SprA
1.4 | Status: AC | PRN
Start: 2018-07-08 — End: 2018-07-14
  Administered 2018-07-09: 02:00:00

## 2018-07-08 MED ORDER — LORazepam (ATIVAN) injection 0.5 mg
2 | Freq: Once | INTRAMUSCULAR | Status: AC
Start: 2018-07-08 — End: 2018-07-08

## 2018-07-08 MED ORDER — pantoprazole (PROTONIX) injection 40 mg
40 | Freq: Every day | INTRAVENOUS | Status: AC
Start: 2018-07-08 — End: 2018-07-14
  Administered 2018-07-09 – 2018-07-14 (×6): 40 mg via INTRAVENOUS

## 2018-07-08 MED ORDER — cefepime (MAXIPIME) 2 g in sodium chloride 0.9% 100 mL IVPB ADDaptor
Freq: Three times a day (TID) | INTRAVENOUS | Status: AC
Start: 2018-07-08 — End: 2018-07-10
  Administered 2018-07-08 – 2018-07-10 (×7): 2 g via INTRAVENOUS

## 2018-07-08 MED ORDER — enoxaparin (LOVENOX) for prophylaxis syringe 40 mg/0.4 mL
40 | Freq: Every day | SUBCUTANEOUS | Status: AC
Start: 2018-07-08 — End: 2018-07-14
  Administered 2018-07-08 – 2018-07-14 (×7): 40 mg via SUBCUTANEOUS

## 2018-07-08 MED ORDER — sodium chloride 0.9 % 1,000 mL bolus
Freq: Once | INTRAVENOUS | Status: AC
Start: 2018-07-08 — End: 2018-07-08
  Administered 2018-07-08: 20:00:00 via INTRAVENOUS

## 2018-07-08 MED ORDER — cefepime (MAXIPIME) 2 g in sodium chloride 0.9% 100 mL IVPB ADDaptor
2 | Freq: Once | INTRAMUSCULAR | Status: AC
Start: 2018-07-08 — End: 2018-07-08

## 2018-07-08 MED ORDER — sodium chloride 0.9 % infusion
INTRAVENOUS | Status: AC
Start: 2018-07-08 — End: 2018-07-09
  Administered 2018-07-08 – 2018-07-09 (×4): 100 mL/h via INTRAVENOUS

## 2018-07-08 MED ORDER — lidocaine (XYLOCAINE) 2 % jelly
2 | Status: AC | PRN
Start: 2018-07-08 — End: 2018-07-14
  Administered 2018-07-09 – 2018-07-10 (×2): via TOPICAL

## 2018-07-08 MED ORDER — metroNIDAZOLE (FLAGYL) in sodium chloride, iso-osm IVPB 500 mg
500 | Freq: Three times a day (TID) | INTRAVENOUS | Status: AC
Start: 2018-07-08 — End: 2018-07-10
  Administered 2018-07-08 – 2018-07-10 (×6): 500 mg via INTRAVENOUS

## 2018-07-08 MED ORDER — LORazepam (ATIVAN) 2 mg/mL injection
2 | INTRAMUSCULAR | Status: AC
Start: 2018-07-08 — End: 2018-07-08
  Administered 2018-07-08: 13:00:00 0.5 via INTRAVENOUS

## 2018-07-08 MED ORDER — acetaminophen (TYLENOL) oral solution Soln 650 mg
650 | ORAL | Status: AC | PRN
Start: 2018-07-08 — End: 2018-07-09

## 2018-07-08 MED FILL — LIDOCAINE HCL 2 % MUCOSAL JELLY: 2 2 % | Qty: 30

## 2018-07-08 MED FILL — METRONIDAZOLE 500 MG/100 ML IN SODIUM CHLOR(ISO) INTRAVENOUS PIGGYBACK: 500 500 mg/100 mL | INTRAVENOUS | Qty: 100

## 2018-07-08 MED FILL — LORAZEPAM 2 MG/ML INJECTION SOLUTION: 2 2 mg/mL | INTRAMUSCULAR | Qty: 1

## 2018-07-08 MED FILL — ONDANSETRON HCL (PF) 4 MG/2 ML INJECTION SOLUTION: 4 4 mg/2 mL | INTRAMUSCULAR | Qty: 2

## 2018-07-08 MED FILL — SODIUM CHLORIDE 0.9 % INTRAVENOUS PIGGYBACK: 2.00 2.00 g | INTRAVENOUS | Qty: 100

## 2018-07-08 MED FILL — ENOXAPARIN 40 MG/0.4 ML SUBCUTANEOUS SYRINGE: 40 40 mg/0.4 mL | SUBCUTANEOUS | Qty: 0.4

## 2018-07-08 MED FILL — PHENASEPTIC 1.4 % MUCOSAL SPRAY: 1.4 1.4 % | Qty: 177

## 2018-07-08 MED FILL — CEFEPIME 2 GRAM SOLUTION FOR INJECTION: 2 2 gram | INTRAMUSCULAR | Qty: 1

## 2018-07-08 MED FILL — PANTOPRAZOLE 40 MG TABLET,DELAYED RELEASE: 40 40 MG | ORAL | Qty: 1

## 2018-07-08 MED FILL — PROMETHAZINE 25 MG/ML INJECTION SOLUTION: 25 25 mg/mL | INTRAMUSCULAR | Qty: 1

## 2018-07-08 MED FILL — KETOROLAC 30 MG/ML (1 ML) INJECTION SOLUTION: 30 30 mg/mL (1 mL) | INTRAMUSCULAR | Qty: 1

## 2018-07-08 MED FILL — HYDROMORPHONE 1 MG/ML INJECTION SYRINGE: 1 1 mg/mL | INTRAMUSCULAR | Qty: 1

## 2018-07-08 MED FILL — TYLENOL 325 MG TABLET: 325 325 mg | ORAL | Qty: 2

## 2018-07-08 MED FILL — DILAUDID (PF) 0.5 MG/0.5 ML INJECTION SYRINGE: 0.5 0.5 mg/0.5 mL | INTRAMUSCULAR | Qty: 0.5

## 2018-07-08 NOTE — Progress Notes (Signed)
Pt's UOP only 150 ml by 1500. Vela Prose MD informed. Bolus ordered and administered. Pt currently in bathroom. Will check output shortly.     Pt continuously requesting ice. Clarified with MD if we should put limits on her intake. MD said to just keep track of it.     Pt's bed @ lowest position and locked, CLWR. Will continue to monitor.     Danford Bad, RN

## 2018-07-08 NOTE — Unmapped (Signed)
Paged team. Patient is actively vomiting serosangunious fluid up. Patient stated that this is new. RN has no anti- metic to give. Team stated they will discuss and place orders. RN also advising team that patient's blood pressure has been running higher then normal for the evening shift. Patient stated she doesn't take blood pressure meds at home. RN will continue to monitor.

## 2018-07-08 NOTE — Unmapped (Signed)
Hematology/Oncology Clinical Pharmacy Note:   Consult for Febrile Neutropenia    Kelsey Sullivan (VW#[id number]) is a 77 y.o. female who has been ordered a pharmacy consult to evaluate time to initiation of antibiotic therapy for neutropenic fever.      ?? CSN: [id number]  ?? Hospital Day: 2  ?? Admission Date: 07/07/2018  4:14 PM  ?? Admission Diagnosis: SMALL BOWEL OBSTRUCTION     Active Problem List:  Patient Active Problem List   Diagnosis   ??? Malignant neoplasm of both ovaries (CMS Dx)       Objective:    24 Hour Vital Signs:   Temp:  [97.8 ??F (36.6 ??C)-100.9 ??F (38.3 ??C)] 99.6 ??F (37.6 ??C)  Heart Rate:  [81-101] 88  Resp:  [15-16] 16  BP: (101-161)/(53-94) 122/58    Intake/Output:  I/O last 3 completed shifts:  In: 0   Out: 150 [Urine:150]    Pertinent Labs:  Recent Labs      07/07/18   1054  07/08/18   0443   WBC  3.0*  0.8*   NEUTROABS  2,049  522*   CREATININE  0.55*  0.63       Scheduled Medications:  Current Facility-Administered Medications   Medication Dose Frequency Provider Last Dose   ??? acetaminophen  650 mg Q4H PRN Mauri Brooklyn, MD 650 mg at 07/08/18 8657   ??? ceFEPime (MAXIPIME) IV extended infusion  2 g Q8H Georgette Dover, MD 2 g at 07/08/18 0558   ??? HYDROmorphone  0.5 mg Q4H PRN Malcolm Metro, MD      Or   ??? HYDROmorphone  1 mg Q4H PRN Malcolm Metro, MD     ??? ketorolac  15 mg Q6H PRN Malcolm Metro, MD 15 mg at 07/07/18 2050   ??? ondansetron  4 mg Q8H PRN Mauri Brooklyn, MD      Or   ??? ondansetron  4 mg Q8H PRN Mauri Brooklyn, MD 4 mg at 07/07/18 1857   ??? pantoprazole  40 mg DAILY 0600 Mauri Brooklyn, MD 40 mg at 07/08/18 8469   ??? proCHLORPERazine  10 mg Q6H PRN Mauri Brooklyn, MD     ??? sodium chloride 0.9 %  100 mL/hr Continuous Georgette Dover, MD 100 mL/hr at 07/08/18 0103       PRN medications:  acetaminophen, HYDROmorphone **OR** HYDROmorphone, ketorolac, ondansetron **OR** ondansetron, proCHLORPERazine    Assessment/Plan:    Neutropenic Fever:     07/08/18 0700   Neutropenic Fever Evaluation   Date of NF event 07/07/18   Patient pathway [facility]   Regimen CARBO AUC=6 / Weekly Paclitaxel  (CARBO AUC=6 / Weekly Paclitaxel)   Cycle # 4   Day # 7   Growth factor received with this cycle of chemotherapy None   Does patient meet neutropenic fever criteria? Yes   Timing Evaluation   Name of antipseudomonal antibiotic  Cefepime   Dose appropriate Yes   Frequency appropriate Yes   Date of arrival (or date of fever for inpatients) 07/08/18   Time of arrival (or time of fever for inpatients) 0300   Time antipseudomonal antibiotic order placed 0532   Time order verified by pharmacist [phone]   Start date of administration 07/08/18   Start time of administration 0558   Total time to antibiotic initiation (minutes) 178   Did patient receive appropriate antibiotic within 2 hours of NF presentation?  No   Source of Antibiotic Pyxis     ??  Patient admitted on 7/31 however based on labs from earlier in the morning patient was NOT neutropenic (WBC=3/ANC=2049).  ?? Morning labs on 8/1 @ 0443 indicate patient with neutropenia (WBC=0.8/ANC=522).   ?? Fever of 100.9 documented on 8/1 @ 0300  ?? Patient did NOT receive ABX within 2 hours of documentation of fever/neutropenia    Thank you for the opportunity to participate in the care of Kelsey Sullivan.  Pharmacy will continue to follow.     Cheri Guppy, Pharm.D.   Hematology / Oncology / Bone Marrow Transplant  [facility]   [address]   [place], [place] [address]   Office: [phone]   Fax: [phone]  Oncall pager: [phone]

## 2018-07-08 NOTE — Unmapped (Signed)
Per Gyn/Onc Resident progress notes, Ms. Huss not medically ready for discharge.  POC: conservative management, NPO, IVF, anti-emetics.    Care Coordinator will follow, assist as needed.       Adele Dan RN CCM  Care Coordination   [phone]

## 2018-07-08 NOTE — Unmapped (Signed)
Pt again only had 100 ml urine in hat. Concern that pt might be missing the hat. 2nd hat placed in back. Vela Prose MD informed. Pt's bed @ lowest position and locked, CLWR. Will continue to monitor.     Danford Bad, RN

## 2018-07-08 NOTE — Unmapped (Signed)
Paged team. Patient has critical lab result of 0.8 for WBC . Patient does have temp of 100.9 however RN's are only to notify teams if temp is 101.4 or greater unless specific orders state otherwise. Team is aware. Team did come to assess patient following page. RN requested for team to place specific orders if they need to be notified about temp less then 101.4 so there is clear communication between staff and team thru the day. RN did administer 650 mg of tylenol for temp. Will continue to monitor.

## 2018-07-08 NOTE — Care Coordination-Inpatient (Signed)
[facility]  Care Management Department    High Risk Screen    Name: Kelsey Sullivan  MRN:  [id number]  Date:   07/08/2018     High Risk Screen  Patient admitted from nursing home, group home or rehab facility: No  Patient is over 77 years of age and lives alone: No  Patient is active with Hospice services: No  Patient is suspected victim of abuse, neglect, violence: No  Current alcohol or drug abuse: No  Patient is homeless: No  Patient is from justice center or on police hold: No  Patient has a new diagnosis of a terminal illness: No  Patient has a new diagnosis of a chronic illness: No  Patient has multiple co-morbidities and/or>5 home medications: Yes  Patient has no PCP or medical home: No  Patient has history of dementia, mental illness or DD: No  Patient has no payer source: No  Patient has previous admission within last 30 days: No  Anticipate specialized home health needs, i.e., wounds, trach, cellulitis, ostomy, tube-feeds, TPN, DME: No  Score: 2    Assignment of Risk Level    Patient has been screened by Care Mangement and meets Moderate Risk Indicators, psychosocial assessment to follow. (Score of 2-4)      Desmond Lope [facility]  Care Coordination   [phone]

## 2018-07-08 NOTE — Nursing Note (Signed)
Paged team. Patient requesting something for her nasal area and throat due to soreness from NG . Waiting on call back from team.

## 2018-07-08 NOTE — Unmapped (Signed)
Pt seen has her WBC count is 0.8 , absolute neutrophils- 522  Pt had temp of 100.9 at 0300 am and repeat check was 100.2  Pt reports she feels okay, no subjective fever, chills.  She had one episode of emesis at midnight , received phenergan and not ahd any since  Pt reported she slept well  Pt also had CT -scan overnight- which shows small bowel obstruction.  I have informed the patient the findings    On examination  GEN- NAD  PULM- CTAB  CV- RRR  Adbomen- soft, minimal distension      Vitals:    07/08/18 0505   BP: 101/53   Pulse: 96   Resp: 16   Temp: 100.2 ??F (37.9 ??C)   SpO2:        Impression:  neutropenic fever  Plan  -pt informed  - nurse informed about time sensitivity in getting labs and starting antibiotics  - called lab for immediate draw of blood cultures x2  - chest x-ray  -urine culture  - iv antibiotic ordered  - is on maintenance fluids  - discussed with Dr. Jenita Seashore and Dr. [name]  - inform if any concerns    Sandria Manly, MD  OB/GYN  PGY-2

## 2018-07-08 NOTE — Unmapped (Signed)
Gynecologic Oncology Progress Note    Indication for admission:  small bowel obstruction  Hospital day:  1    HPI: Kelsey Sullivan is 77 y.o. 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   ??  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 She declined adjuvant therapy. She used natural methods, went to a raw diet for a year, etc. She developed a small bowel obstruction in 03/2017, resolved on its own, with NED at that time. In 01/2018, she experienced mid abdominal pain, and seen by PCP and ED, with CT of abdomen and pelvis showing interval development of necrotic retroperitoneal lymphadenopathy with largest node measuring 4.9 ??4.2 x 4.8 cm. Her CA 125 on 02/18/18 was 197.8. ??She underwent a ct-guided nodal biopsy on 02/26/18, with pathology confirming recurrent disease. 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 She was seen at [facility] in [place] with Dr. Ishmael Holter. She is undergoing chemotherapy with Carboplatin and Paclitaxel-weekly. ??  ??  On 7/31 she experienced abdominal pain that was sharp on her LLQ and had significant diarrhea. The pain waxes and wanes, and does not radiate. It is now a 4 but was once an 8/10. She attributes it to eating a variety of fruits and vegetables. She is experiencing intermittent nausea, and this episodes reminds her of her small bowel obstruction incident in 2018. She denies fevers, chills, shortness of breath, dysuria, and leg pain.  She was give  IL NS, 2mg  morphine IV, and 4mg  Zofran IV in clinic.      S:  Overnight at 3am she was found to be febrile to 100.9, and labs showed WBC 0.8 ANC 522. She was put on neutropenic fever precautions. She was given a dose of IV cefepime. She had one emesis overnight and was given a dose of phenergan. Blood and urine cultures are pending. CXR shows clear lungs, waiting final read. At 6am, her T was 99.6.     This morning, she is feeling good with minimal nausea and pain. She was smiling when walking into her room. She has not eaten anything since yesterday. Endorses mild abdominal pain that is intermittent.    O:  Vitals:    07/08/18 0021 07/08/18 0300 07/08/18 0505 07/08/18 0602   BP: 161/67 127/75 101/53 122/58   BP Location: Right arm Left arm Left arm Left arm   Patient Position: Lying Lying Lying Lying   Pulse: 101 96 96 88   Resp: 16 16 16 16    Temp: 98.9 ??F (37.2 ??C) 100.9 ??F (38.3 ??C) 100.2 ??F (37.9 ??C) 99.6 ??F (37.6 ??C)   TempSrc: Oral Oral Oral Oral   SpO2: 96% 95%  97%   Weight:       Height:             Intake/Output Summary (Last 24 hours) at 07/08/18 1610  Last data filed at 07/08/18 0430   Gross per 24 hour   Intake  997 ml   Output              151 ml   Net              846 ml         Gen:  NAD  Pulm: no work of breathing  CV:  well perfused  Abd:  Soft, nontender, no guarding  Ext:  Warm, well perfused, palpable peripheral pulses    Labs:  Lab Results   Component Value Date    WBC 0.8 (LL) 07/08/2018    HGB 8.1 (L) 07/08/2018    HCT 22.6 (L) 07/08/2018    MCV 109.8 (H) 07/08/2018    PLT 193 07/08/2018     Lab Results   Component Value Date    CREATININE 0.63 07/08/2018    BUN 17 07/08/2018    NA 130 (L) 07/08/2018    K 4.0 07/08/2018    CL 97 (L) 07/08/2018    CO2 27 07/08/2018       Cultures:  -8/1 blood cultures and urine cultures pending    Imaging:  KUB 7/31  FINDINGS:  There is distention of the small bowel with multiple air-fluid levels on the upright view. A fluid and gas level is  present in the gastric fundus. Gas appears to extend to the level of the rectum. There are postsurgical changes in the pelvis. Underlying organ shadows are grossly normal.  ??  IMPRESSION:  Nonspecific bowel gas pattern. The presence of multiple air-fluid levels in small bowel raises question of early or partial small bowel obstruction.    CT AP 7/31  FINDINGS:  Lower chest: Dependent atelectasis  ??  Liver: Diffuse low attenuation compatible with hepatic steatosis.  No focal hepatic lesions.  ??  Biliary tree: No intra or extra-hepatic biliary ductal dilatation. The gallbladder is unremarkable.  ??  Spleen: Not enlarged.   ??  Pancreas: Unremarkable.  ??  Adrenal glands: Unremarkable.  ??  Kidneys/ureters/bladder: The kidneys enhance symmetrically. No focal renal lesions.  No hydronephrosis. The urinary bladder is unremarkable.  ??  Gastrointestinal tract: There is dilation of the small bowel with transition point seen in the anterior mid abdomen near the midline (series 2 image 67 and series 602 image 44). The appendix is normal.  ??  Lymphatics: No abdominal or pelvic lymph nodes are enlarged by size criteria.  ??  Vasculature: Scattered atherosclerotic plaque without aneurysmal dilatation.  ??  Peritoneum: Small amount of free fluid in the abdomen. A para-aortic density with central low attenuation measures 2.5 x 2 cm and is significantly decreased in size from prior.  ??  Abdominal wall/soft tissues: Unremarkable.  ??  Pelvic Organs: The uterus is surgically absent.  ??  Osseous structures: No acute osseous abnormalities or suspicious osseous lesions. Degenerative changes of the lumbar spine.  ??  IMPRESSION:  Small bowel obstruction with transition point in the anterior mid abdomen near the midline. Small amount of free fluid in the abdomen, which is nonspecific.  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  ??  ??  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  ??  Cardiovascular  No acute concerns  ??  Respiratory  No active concers  ??  FEN/GI/Renal  Poss small bowel obstruction  - KUB 7/31 dilated bowel loops  - CT AP 7/31: Small bowel obstruction with transition point in the anterior mid abdomen near the midline  - 1 episode of emesis 7/31, given phenergan  - Conservative management  - NPO  - NS @ 161ml/hr  - zofran, compazine, phenergan for nausea  - Replace electrolytes PRN to maintain K>4, Phos >3, Mag >2  - home med: protonix 40mg  daily  ??  ID  Suspected infection  - Fever 100.9 @0300  8/1, WBC 0.8, ANC 522, blood & urine cultures pending, CXR showed clear lungs (waiting final read), given dose of cefepime. @0600  T 99.6  ??  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   -Used natural methods, went to a raw diet for a year, etc.   -Developed a small bowel obstruction in 03/2017, resolved on its own, with NED   -In 01/2018, she experienced mid abdominal pain, and seen by PCP and ED, with CT of abdomen and pelvis showing interval development of necrotic retroperitoneal lymphadenopathy with largest node measuring 4.9 ??4.2 x 4.8 cm.   -Her CA 125 = 197.8 02/18/18. 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      Neutropenia  - 7/31, no neutropenia (ANC >2000)  - 8/1 ANC 522, put on neutropenic fever precautions.  ??  Anemia  - H/H 9.4/ 26.1 (7/31) --> 8/1/ 22.6 (8/1)  - Will continue to monitor  ??  Endocrine  No active concerns  ??  Neuro  Pain Control  - IV dilaudid 0.5-1mg  qhrs PRN, IV Toradol 30mg  q6hrs PRN, Tylenol PRN  ??  MSK  No active concerns    Prophylaxis  DVT  - SCDs  - hold start lovenox for now  ??  Dispo:  inpatient  ??  Vergie Living, MD  Ob/Gyn PGY-1  Gyn Onc Pager:  [phone]    ATTENDING ADDENDUM    I saw and personally examined the patient today, 07/08/18. I discussed the findings and therapeutic plan with the patient. I repeated, reviewed and and agree with the history, physical examination, and medical decisions as outlined in Dr.Calhoun's note (SEE ABOVE).  I have made changes as required to the medical record above.      Warner Mccreedy, MD    Division of Gynecologic Oncology  [phone]

## 2018-07-08 NOTE — Unmapped (Signed)
Placed NG tube for conservative management of small bowel obstruction. Patient given 0.5mg  ativan and 0.5mg  dilaudid prior to procedure. Measured tubing prior to placement. Inserted tubing and taped it measurement marking along nasal bridge. About of bilious gastric contents was expelled immediately. KUB abdominal XR ordered to confirm proper position. Waiting on read before connecting to wall suction.    Also connected with pharmacy about antibiotics and recommended broadening coverage. Added 500mg  IV q8hr of metronidazole. Blood and urine cultures still pending. Tlast 100.9 @ 0300 8/1. CXR read shows no acute cardiopulmonary abnormalities.     Vitals:    07/08/18 0841   BP: 96/66   Pulse: 99   Resp: 16   Temp: 97.5 ??F (36.4 ??C)   SpO2: 93%       Vergie Living, MD  Ob/Gyn PGY-1

## 2018-07-09 ENCOUNTER — Ambulatory Visit: Payer: Medicare (Managed Care)

## 2018-07-09 LAB — BASIC METABOLIC PANEL
Anion Gap: 8 mmol/L (ref 3–16)
BUN: 22 mg/dL (ref 7–25)
CO2: 23 mmol/L (ref 21–33)
Calcium: 7.9 mg/dL (ref 8.6–10.3)
Chloride: 107 mmol/L (ref 98–110)
Creatinine: 0.61 mg/dL (ref 0.60–1.30)
Glucose: 86 mg/dL (ref 70–100)
Osmolality, Calculated: 289 mOsm/kg (ref 278–305)
Potassium: 3.4 mmol/L (ref 3.5–5.3)
Sodium: 138 mmol/L (ref 133–146)
eGFR AA CKD-EPI: >90 See note.
eGFR NONAA CKD-EPI: 88 See note.

## 2018-07-09 LAB — DIFFERENTIAL
Basophils Absolute: 6 /uL (ref 0–200)
Basophils Relative: 0.3 % (ref 0.0–1.0)
Eosinophils Absolute: 25 /uL (ref 15–500)
Eosinophils Relative: 1.3 % (ref 0.0–8.0)
Lymphocytes Absolute: 511 /uL (ref 850–3900)
Lymphocytes Relative: 26.9 % (ref 15.0–45.0)
Monocytes Absolute: 169 /uL (ref 200–950)
Monocytes Relative: 8.9 % (ref 0.0–12.0)
Neutrophils Absolute: 1189 /uL (ref 1500–7800)
Neutrophils Relative: 62.6 % (ref 40.0–80.0)
nRBC: 0 /100 WBC (ref 0–0)

## 2018-07-09 LAB — ABO/RH
Rh Type: POSITIVE
Rh Type: POSITIVE

## 2018-07-09 LAB — CBC
Hematocrit: 19.8 % (ref 35.0–45.0)
Hemoglobin: 6.9 g/dL (ref 11.7–15.5)
MCH: 38.9 pg (ref 27.0–33.0)
MCHC: 34.8 g/dL (ref 32.0–36.0)
MCV: 111.6 fL (ref 80.0–100.0)
MPV: 7.7 fL (ref 7.5–11.5)
Platelets: 171 10*3/uL (ref 140–400)
RBC: 1.77 10*6/uL (ref 3.80–5.10)
RDW: 17.4 % (ref 11.0–15.0)
WBC: 1.9 10*3/uL (ref 3.8–10.8)

## 2018-07-09 LAB — PHOSPHORUS: Phosphorus: 2.1 mg/dL (ref 2.1–4.5)

## 2018-07-09 LAB — ANTIBODY SCREEN: Antibody Screen: NEGATIVE

## 2018-07-09 LAB — MAGNESIUM: Magnesium: 1.6 mg/dL (ref 1.5–2.5)

## 2018-07-09 MED ORDER — magnesium sulfate in sterile water 100 mL IVPB 4 g
4 | Freq: Once | INTRAVENOUS | Status: AC
Start: 2018-07-09 — End: 2018-07-09
  Administered 2018-07-09: 23:00:00 4 g via INTRAVENOUS

## 2018-07-09 MED ORDER — acetaminophen (TYLENOL) oral solution Soln 650 mg
650 | ORAL | Status: AC | PRN
Start: 2018-07-09 — End: 2018-07-12
  Administered 2018-07-10 – 2018-07-12 (×2): 650 mg via ORAL

## 2018-07-09 MED ORDER — magnesium sulfate in D5W 100 mL 1 gram/100 mL IVPB
1 | INTRAVENOUS | Status: AC
Start: 2018-07-09 — End: 2018-07-09
  Administered 2018-07-09: 14:00:00 1 via INTRAVENOUS

## 2018-07-09 MED ORDER — dextrose 5 % and 0.9 % NaCl infusion 1000 mL
INTRAVENOUS | Status: AC
Start: 2018-07-09 — End: 2018-07-12
  Administered 2018-07-09 – 2018-07-11 (×3): 100 mL/h via INTRAVENOUS
  Administered 2018-07-12: 06:00:00 75 mL/h via INTRAVENOUS

## 2018-07-09 MED ORDER — magnesium sulfate in D5W 100 mL 1 gram/100 mL IVPB
1 | INTRAVENOUS | Status: AC
Start: 2018-07-09 — End: 2018-07-09
  Administered 2018-07-09: 16:00:00 1 via INTRAVENOUS

## 2018-07-09 MED ORDER — electrolyte-R (pH 7.4) (NORMOSOL-R pH 7.4) 1,000 mL bolus
Freq: Once | INTRAVENOUS | Status: AC
Start: 2018-07-09 — End: 2018-07-09
  Administered 2018-07-09: 20:00:00 via INTRAVENOUS

## 2018-07-09 MED ORDER — benzocaine-menthol (CHLORASEPTIC) 6-10 mg lozenge 1 lozenge
6-10 | Status: AC | PRN
Start: 2018-07-09 — End: 2018-07-14
  Administered 2018-07-09: 15:00:00 1

## 2018-07-09 MED ORDER — sodium chloride 0.9 % infusion
INTRAVENOUS | Status: AC
Start: 2018-07-09 — End: 2018-07-09
  Administered 2018-07-09: 16:00:00 20 mL/h via INTRAVENOUS

## 2018-07-09 MED ORDER — acetaminophen (TYLENOL) tablet 650 mg
325 | Freq: Once | ORAL | Status: AC
Start: 2018-07-09 — End: 2018-07-09
  Administered 2018-07-09: 15:00:00 650 mg via ORAL

## 2018-07-09 MED ORDER — diphenhydrAMINE (BENADRYL) capsule 25 mg
25 | Freq: Once | ORAL | Status: AC
Start: 2018-07-09 — End: 2018-07-09
  Administered 2018-07-09: 15:00:00 25 mg via ORAL

## 2018-07-09 MED ORDER — magnesium sulfate in D5W 100 mL 1 gram/100 mL IVPB 1 g
1 | INTRAVENOUS | Status: AC
Start: 2018-07-09 — End: 2018-07-09

## 2018-07-09 MED ORDER — potassium chloride (KCl)/Sterile water 100 mL 10 mEq/100 mL IVPB 10 mEq
10 | INTRAVENOUS | Status: AC
Start: 2018-07-09 — End: 2018-07-10
  Administered 2018-07-09 – 2018-07-10 (×6): 10 meq via INTRAVENOUS

## 2018-07-09 MED ORDER — dextrose 5 % in lactated ringers (D5LR) infusion 1000 mL
INTRAVENOUS | Status: AC
Start: 2018-07-09 — End: 2018-07-09

## 2018-07-09 MED FILL — KETOROLAC 30 MG/ML (1 ML) INJECTION SOLUTION: 30 30 mg/mL (1 mL) | INTRAMUSCULAR | Qty: 1

## 2018-07-09 MED FILL — SODIUM CHLORIDE 0.9 % INTRAVENOUS PIGGYBACK: 2.00 2.00 g | INTRAVENOUS | Qty: 100

## 2018-07-09 MED FILL — CEFEPIME 2 GRAM SOLUTION FOR INJECTION: 2 2 gram | INTRAMUSCULAR | Qty: 1

## 2018-07-09 MED FILL — METRONIDAZOLE 500 MG/100 ML IN SODIUM CHLOR(ISO) INTRAVENOUS PIGGYBACK: 500 500 mg/100 mL | INTRAVENOUS | Qty: 100

## 2018-07-09 MED FILL — POTASSIUM CHLORIDE 10 MEQ/100ML IN STERILE WATER INTRAVENOUS PIGGYBACK: 10 10 mEq/100 mL | INTRAVENOUS | Qty: 100

## 2018-07-09 MED FILL — DEXTROSE 5 % AND LACTATED RINGERS INTRAVENOUS SOLUTION: 100.00 100.00 mL/hr | INTRAVENOUS | Qty: 1000

## 2018-07-09 MED FILL — CHLORASEPTIC SORE THROAT 6 MG-10 MG LOZENGES: 6-10 6-10 mg | Qty: 1

## 2018-07-09 MED FILL — MAGNESIUM SULFATE 1 GRAM/100 ML IN DEXTROSE 5 % INTRAVENOUS PIGGYBACK: 1 1 gram/100 mL | INTRAVENOUS | Qty: 100

## 2018-07-09 MED FILL — ENOXAPARIN 40 MG/0.4 ML SUBCUTANEOUS SYRINGE: 40 40 mg/0.4 mL | SUBCUTANEOUS | Qty: 0.4

## 2018-07-09 MED FILL — DIPHENHYDRAMINE 25 MG CAPSULE: 25 25 mg | ORAL | Qty: 1

## 2018-07-09 MED FILL — MAGNESIUM SULFATE 4 GRAM/100 ML (4 %) IN WATER INTRAVENOUS PIGGYBACK: 4 4 gram/100 mL (4 %) | INTRAVENOUS | Qty: 100

## 2018-07-09 MED FILL — ACETAMINOPHEN 650 MG/20.3 ML ORAL SOLUTION: 650 650 mg/20.3 mL | ORAL | Qty: 20.3

## 2018-07-09 MED FILL — TYLENOL 325 MG TABLET: 325 325 mg | ORAL | Qty: 2

## 2018-07-09 MED FILL — PROTONIX 40 MG INTRAVENOUS SOLUTION: 40 40 mg | INTRAVENOUS | Qty: 40

## 2018-07-09 NOTE — Unmapped (Signed)
Per Physician progress note, Ms. Rivenburg not medically ready for discharge.  N/G in place, will also receive 2U PRBC today.       Will continue to monitor stattus.  Assist as needed.      Adele Dan RN CCM  Care Coordination   [phone]

## 2018-07-09 NOTE — Unmapped (Signed)
Spoke to team. Patient is requesting chloraseptic drops for her throat. Team will place order. Patient's urine output total for the shift was 400 ml. Team is aware. Patient also inquiring about receiving her chemo treatment today. Team will discuss and follow up with patient regarding plan today.

## 2018-07-09 NOTE — Progress Notes (Signed)
Gynecologic Oncology Progress Note    Indication for admission:  small bowel obstruction, neutropenic fever   Hospital day:  2    HPI: Kelsey Sullivan is 77 y.o. 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 She is undergoing chemotherapy with Carboplatin and Paclitaxel-weekly. She underwent C4D1 07/02/18.     Interval Hx:   An NG tube was placed 8/1 with 1L bilious gastric contents. She had low urine output yesterday, and was given a 1L bolus NS, and UOP improved. This morning, she is feeling better with minimal nausea and pain. She was walking around in the room when examining her. She is not passing gas. Her throat hurts    O:  Vitals:    07/08/18 0841 07/08/18 1624 07/08/18 2015 07/09/18 0238   BP: 96/66 116/52 129/61 111/67   BP Location: Left arm Right arm Left arm Left arm   Patient Position: Lying Sitting Lying Lying   Pulse: 99 95 96 86   Resp: 16  16 16    Temp: 97.5 F (36.4 C) 99 F (37.2 C) 98.9 F (37.2 C) 97.5 F (36.4 C)   TempSrc: Oral Oral Oral Oral   SpO2: 93% 97% 96% 94%   Weight:       Height:             Intake/Output Summary (Last 24 hours) at 07/09/18 9811  Last data filed at 07/09/18 9147   Gross per 24 hour   Intake           3957.9 ml   Output             2725 ml   Net           1232.9 ml         Gen:  NAD  Pulm: no work of breathing  CV:  well perfused  Abd:  Soft, nontender, no guarding, mild distended  Ext:  Warm, well perfused, palpable peripheral pulses    NG tube output in 24 hrs: 2075 ml  UOP in 24 hrs: 550 cc (0.42 cc/kg/hr)    Labs:  Lab Results   Component Value Date    WBC 0.8 (LL) 07/08/2018    HGB 8.1 (L) 07/08/2018    HCT 22.6 (L) 07/08/2018    MCV 109.8 (H) 07/08/2018    PLT 193 07/08/2018     Lab Results   Component Value Date    CREATININE 0.63 07/08/2018    BUN 17 07/08/2018    NA 130 (L) 07/08/2018    K 4.0  07/08/2018    CL 97 (L) 07/08/2018    CO2 27 07/08/2018       Cultures:  -blood cultures and urine cultures NGTD    Imaging:  KUB 7/31  FINDINGS:  There is distention of the small bowel with multiple air-fluid levels on the upright view. A fluid and gas level is present in the gastric fundus. Gas appears to extend to the level of the rectum. There are postsurgical changes in the pelvis. Underlying organ shadows are grossly normal.    IMPRESSION:  Nonspecific bowel gas pattern. The presence of multiple air-fluid levels in small bowel raises question of early or partial small bowel obstruction.    CT AP 7/31  FINDINGS:  Lower chest: Dependent atelectasis    Liver: Diffuse low attenuation compatible  with hepatic steatosis.  No focal hepatic lesions.    Biliary tree: No intra or extra-hepatic biliary ductal dilatation. The gallbladder is unremarkable.    Spleen: Not enlarged.     Pancreas: Unremarkable.    Adrenal glands: Unremarkable.    Kidneys/ureters/bladder: The kidneys enhance symmetrically. No focal renal lesions.  No hydronephrosis. The urinary bladder is unremarkable.    Gastrointestinal tract: There is dilation of the small bowel with transition point seen in the anterior mid abdomen near the midline (series 2 image 67 and series 602 image 44). The appendix is normal.    Lymphatics: No abdominal or pelvic lymph nodes are enlarged by size criteria.    Vasculature: Scattered atherosclerotic plaque without aneurysmal dilatation.    Peritoneum: Small amount of free fluid in the abdomen. A para-aortic density with central low attenuation measures 2.5 x 2 cm and is significantly decreased in size from prior.    Abdominal wall/soft tissues: Unremarkable.    Pelvic Organs: The uterus is surgically absent.    Osseous structures: No acute osseous abnormalities or suspicious osseous lesions. Degenerative changes of the lumbar spine.    IMPRESSION:  Small bowel obstruction with transition point in the  anterior mid abdomen near the midline. Small amount of free fluid in the abdomen, which is nonspecific.  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 She underwent C4D1 07/02/18 of  Carboplatin and Paclitaxel-weekly.    Cardiovascular  No acute concerns    Respiratory  No active concers    FEN/GI/Renal  Small bowel obstruction  - KUB 7/31 dilated bowel loops  - CT AP 7/31: Small bowel obstruction with transition point in the anterior mid abdomen near the midline  - 1 episode of emesis 7/31, given phenergan  - Conservative management  - NPO, NG tube placed 8/1 and output 2L in 24 hrs  - NS @ 147ml/hr  - zofran, compazine, phenergan for nausea  - Replace electrolytes PRN to maintain K>4, Phos >3, Mag >2  - home med: protonix 40mg  daily    Low UOP  - Call from nurse reporting in 8 hrs on 8/1  - Given 1L NS bolus, UOP improved  - in 12 hrs overnight, UOP 400 cc  - 8/1 - 8/2 UOP: 0.42 cc/kg/hr    ID  Neutropenic fever   - Fever 100.9 @0300  8/1, WBC 0.8, ANC 522, blood & urine cultures pending (NGTD), CXR showed clear lungs (waiting final read), given dose of cefepime. @0600  T 99.6. Expanded abx to include metronidazole  - 8/2 WBC 1.9, afebrile. 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   -Developed a small bowel obstruction in 03/2017, resolved on its own, with NED   -In 01/2018, she experienced mid abdominal pain, and seen by PCP  and ED, with CT of abdomen and pelvis showing interval development of necrotic retroperitoneal lymphadenopathy with largest node measuring 4.9 4.2 x 4.8 cm.   -Her CA 125 = 197.8 02/18/18. 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 Holding D8 taxol.     Neutropenia  - 7/31, no neutropenia (ANC >2000)  - 8/1 ANC 522, put on neutropenic fever precautions.  - 8/2 ANC 1189  - Will hold off on chemo treatment today     Anemia due to chemotherapy:  - H/H 9.4/ 26.1 (7/31) --> 8.1/ 22.6 (8/1) --> 8/2 (6.9/19.8)  - Will transfuse 2u pRBC 8/2  - Will continue to monitor    Endocrine  No active concerns    Neuro  Pain Control  - IV dilaudid 0.5-1mg  qhrs PRN, IV Toradol 30mg  q6hrs PRN, Tylenol PRN    MSK  No active concerns    Prophylaxis  DVT  - SCDs  - Lovenox    Dispo:  inpatient    Vergie Living, MD  Ob/Gyn PGY-1  Gyn Onc Pager:  [phone]    ATTENDING ADDENDUM    I saw and personally examined the patient today, 07/09/2018. I discussed the findings and therapeutic plan with the patient. I repeated, reviewed and and agree with the history, physical examination, and medical decisions as outlined in Dr. Glade Stanford note.  I have made changes as required to the medical record above.    Carroll Kinds Trupiano   Febrile neutropenia- ANC now 1189- improving, and no more fevers. Cx NGTD. Continue IV abx for now  SBO: continue NGT- output > 2L yesterday.   Anemia due to chemotherapy: 2 units PRBCs today     Recent Labs      07/07/18   1054  07/08/18   0443  07/09/18   0638   WBC  3.0*  0.8*  1.9*   HGB  9.4*  8.1*  6.9*   HCT  26.1*   22.6*  19.8*   PLT  261  193  171   CREATININE  0.55*  0.63  0.61   NA  129*  130*  138   K  4.3  4.0  3.4*   CO2  29  27  23        BP 111/67 (BP Location: Left arm, Patient Position: Lying)   Pulse 86   Temp 97.5 F (36.4 C) (Oral)   Resp 16   Ht 5' 4 (1.626 m)   Wt 121 lb 8 oz (55.1 kg)   SpO2 94%   BMI 20.86 kg/m       Maggie Font, MD  Gynecology Oncology  [phone]

## 2018-07-09 NOTE — Unmapped (Signed)
[facility]   Chart Review Psychosocial Assessment     Kelsey Sullivan  [id number]  77 y.o.  female  White or Caucasian  Marital Status: Divorced    SMALL BOWEL OBSTRUCTION     Referred by: Chart Review  Referred Reason: Discharge Planning      History    Past Medical History:   Diagnosis Date   ??? Bowel obstruction (CMS Dx) 2018   ??? Ovarian cancer (CMS Dx) 2014       History   Drug Use No       History   Alcohol Use   ??? Yes     Comment: 2-3 drinks a day       Mental Health History: denies    Mental Status    Current Mental Status: Awake, Oriented to Person, Oriented to Place, Oriented to Time, Oriented to Situation  Mental Status Prior to Admission: Oriented to Situation, Oriented to Time, Oriented to Place, Oriented to Person, Awake  Activities of Daily Living: Independent       Current Living Arrangements    Current Living Arrangements: Alone  Type of Living Arrangement: Home/Apartment  Living Arrangements Comments: lives alone.      One Story or Two (check all that apply): Multi-Level, Live on first floor  Enter the number of steps and rails to enter the residence: 2  Enter the number of steps and rails inside the residence: 0    Support Systems    Next of Kin/Contact Person: Lolita Cram   Next of Kin Relationship:  Daughter  Next of Kin Phone Number: [phone]      Community Resources Used Prior to Admission: No       Cultural/Spiritual/Language Barriers    Religious/Cultural Factors: Protestant     Other Pertinent Data      Sports coach for Mental Health IssuesPrior to Admission: No          Durable Medical Equipment Prior to Admission: No             Name/number of PCP: Bridgette Habermann MD   [address] [place], [place] [address]    [phone]  Pharmacy: [facility]     Assessment/Plan:    ( Per  Gyn/Onc H&P): 77 y.o. 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   ??  Ms. 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 She declined adjuvant therapy. She used natural methods, went to a raw diet for a year, etc. She developed a small bowel obstruction in 03/2017, resolved on its own, with NED at that time. In 01/2018, she experienced mid abdominal pain, and seen by PCP and ED, with CT of abdomen and pelvis showing interval development of necrotic retroperitoneal lymphadenopathy with largest node measuring 4.9 ??4.2 x 4.8 cm. Her CA 125 on 02/18/18 was 197.8. ??She underwent a ct-guided nodal biopsy on 02/26/18, with pathology confirming recurrent disease. 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 She was seen at  [facility] in [place] with Dr. Ishmael Holter. She is undergoing chemotherapy with Carboplatin and Paclitaxel-weekly. ??INTERVAL HISTORY:  Yesterday she experienced abdominal pain that was sharp on her LLQ and had significant diarrhea. The pain waxes and wanes, and does not radiate. It is now a 4 but was once an 8/10. She attributes it to eating a variety of fruits and vegetables. She is experiencing intermittent nausea, and this episodes reminds her of her small bowel obstruction incident in 2018. She denies fevers, chills, shortness of breath, dysuria, and leg pain.  She was give IL NS, 2mg  morphine IV, and 4mg  Zofran IV in clinic.       Care Coordinator met with Mrs. Glomb at bedside for discharge planning assessment. Care Coordinator explained role, confirmed demographics and provided contact information.       Care Coordinator discussed post discharge  care and support available to ner.  She reports that she lives alone and will  have minimal support.  She does have a daughter that lives locally, however she works and has a family.      At the time of discharge either her daughter or friend will transpot her home, via private vehicle.        She started hemo on 07/02/18.      Currently no care coordination needs hsve been identified.        Patient/Family aware and taking part in the discharge plan.  Patient and family were offered a post-acute provider list as applicable to the discharge plan and insurance provider.  Patient and family were given the freedom to choose providers and financial interest(s) were disclosed as appropriate.    This assessment has been reviewed with the multi-disciplinary team.      Adele Dan RN CCM  Care Coordination   [phone]

## 2018-07-09 NOTE — Unmapped (Signed)
Pt has only had out 150 ml of amber urine this shift. Team informed through OR RN. No new orders regarding this at this time.     Pt had requested this AM some different IVF d/t prolonged NPO status. Vela Prose MD informed. D5LR ordered. Attempted to get from pharmacy but they did not stock it. Was told to get it from med supply. 270 form sent but none was delivered. Continued to run NS off of old order while waiting for fluids. Asked MD through OR RN if fluids could be changed to one we have on PAR. MD agreeable and ordered D5NS. New fluids started.    Pt has had 1 unit PRBC. No s/s of transfusion rx at this time. 2nd unit started. New PIV started for abx and electrolytes. Electrolytes delayed d/t limited access,  incompatibility of MgSulfate with cefapime, and running out of [employer] supply of KCl.     Chair wheels locked, CLWR. Will continue to monitor.     Danford Bad, RN

## 2018-07-09 NOTE — Nursing Note (Signed)
Paged team. Patient has no morning labs to and patient is a unit collect. RN wanted to ensure prior to shift change this morning. Night shift stated that this would be decided by day team.

## 2018-07-10 LAB — CBC
Hematocrit: 24.2 % — ABNORMAL LOW (ref 35.0–45.0)
Hemoglobin: 8.7 g/dL — ABNORMAL LOW (ref 11.7–15.5)
MCH: 35.9 pg — ABNORMAL HIGH (ref 27.0–33.0)
MCHC: 35.9 g/dL (ref 32.0–36.0)
MCV: 99.9 fL (ref 80.0–100.0)
MPV: 7.9 fL (ref 7.5–11.5)
Platelets: 160 10E3/uL (ref 140–400)
RBC: 2.43 10E6/uL — ABNORMAL LOW (ref 3.80–5.10)
RDW: 22.3 % — ABNORMAL HIGH (ref 11.0–15.0)
WBC: 2.4 10E3/uL — ABNORMAL LOW (ref 3.8–10.8)

## 2018-07-10 LAB — DIFFERENTIAL
Basophils Absolute: 5 /uL (ref 0–200)
Basophils Relative: 0.2 % (ref 0.0–1.0)
Eosinophils Absolute: 19 /uL (ref 15–500)
Eosinophils Relative: 0.8 % (ref 0.0–8.0)
Lymphocytes Absolute: 547 /uL — ABNORMAL LOW (ref 850–3900)
Lymphocytes Relative: 22.8 % (ref 15.0–45.0)
Monocytes Absolute: 247 /uL (ref 200–950)
Monocytes Relative: 10.3 % (ref 0.0–12.0)
Neutrophils Absolute: 1582 /uL (ref 1500–7800)
Neutrophils Relative: 65.9 % (ref 40.0–80.0)
nRBC: 0 /100{WBCs} (ref 0–0)

## 2018-07-10 LAB — CREATININE, URINE, RANDOM: Creatinine, Urine: 112.3 mg/dL

## 2018-07-10 LAB — SODIUM, URINE, RANDOM: Sodium, Ur: 180 mmol/L

## 2018-07-10 LAB — BASIC METABOLIC PANEL
Anion Gap: 5 mmol/L (ref 3–16)
BUN: 15 mg/dL (ref 7–25)
CO2: 22 mmol/L (ref 21–33)
Calcium: 7.8 mg/dL (ref 8.6–10.3)
Chloride: 110 mmol/L (ref 98–110)
Creatinine: 0.51 mg/dL (ref 0.60–1.30)
Glucose: 143 mg/dL (ref 70–100)
Osmolality, Calculated: 287 mOsm/kg (ref 278–305)
Potassium: 3.7 mmol/L (ref 3.5–5.3)
Sodium: 137 mmol/L (ref 133–146)
eGFR AA CKD-EPI: >90 See note.
eGFR NONAA CKD-EPI: >90 See note.

## 2018-07-10 LAB — MAGNESIUM: Magnesium: 2.8 mg/dL — ABNORMAL HIGH (ref 1.5–2.5)

## 2018-07-10 LAB — PHOSPHORUS: Phosphorus: 1.2 mg/dL — ABNORMAL LOW (ref 2.1–4.5)

## 2018-07-10 MED ORDER — HYDROmorphone (DILAUDID) injection Syrg 0.5 mg
0.5 | INTRAMUSCULAR | Status: AC | PRN
Start: 2018-07-10 — End: 2018-07-12

## 2018-07-10 MED ORDER — potassium chloride (KCl)/Sterile water 100 mL 10 mEq/100 mL IVPB 10 mEq
10 | INTRAVENOUS | Status: AC
Start: 2018-07-10 — End: 2018-07-10
  Administered 2018-07-10 (×3): 10 meq via INTRAVENOUS

## 2018-07-10 MED ORDER — electrolyte-R (pH 7.4) (NORMOSOL-R pH 7.4) 1,000 mL bolus
Freq: Once | INTRAVENOUS | Status: AC
Start: 2018-07-10 — End: 2018-07-10
  Administered 2018-07-10: 12:00:00 via INTRAVENOUS

## 2018-07-10 MED ORDER — dexamethasone (DECADRON) injection 4 mg
4 | Freq: Three times a day (TID) | INTRAMUSCULAR | Status: AC
Start: 2018-07-10 — End: 2018-07-12
  Administered 2018-07-10 – 2018-07-12 (×6): 4 mg via INTRAVENOUS

## 2018-07-10 MED ORDER — lactated Ringers 500 mL bolus
Freq: Once | INTRAVENOUS | Status: AC
Start: 2018-07-10 — End: 2018-07-10
  Administered 2018-07-10: 04:00:00 via INTRAVENOUS

## 2018-07-10 MED ORDER — HYDROmorphone (DILAUDID) injection Syrg 1 mg
1 | INTRAMUSCULAR | Status: AC | PRN
Start: 2018-07-10 — End: 2018-07-12

## 2018-07-10 MED ORDER — diphenhydrAMINE (BENADRYL) capsule 25 mg
25 | Freq: Every evening | ORAL | Status: AC | PRN
Start: 2018-07-10 — End: 2018-07-14
  Administered 2018-07-11 – 2018-07-14 (×3): 25 mg via ORAL

## 2018-07-10 MED FILL — DEXAMETHASONE SODIUM PHOSPHATE 4 MG/ML INJECTION SOLUTION: 4 4 mg/mL | INTRAMUSCULAR | Qty: 1

## 2018-07-10 MED FILL — METRONIDAZOLE 500 MG/100 ML IN SODIUM CHLOR(ISO) INTRAVENOUS PIGGYBACK: 500 500 mg/100 mL | INTRAVENOUS | Qty: 100

## 2018-07-10 MED FILL — POTASSIUM CHLORIDE 10 MEQ/100ML IN STERILE WATER INTRAVENOUS PIGGYBACK: 10 10 mEq/100 mL | INTRAVENOUS | Qty: 100

## 2018-07-10 MED FILL — PROTONIX 40 MG INTRAVENOUS SOLUTION: 40 40 mg | INTRAVENOUS | Qty: 40

## 2018-07-10 MED FILL — CEFEPIME 2 GRAM SOLUTION FOR INJECTION: 2 2 gram | INTRAMUSCULAR | Qty: 1

## 2018-07-10 MED FILL — KETOROLAC 30 MG/ML (1 ML) INJECTION SOLUTION: 30 30 mg/mL (1 mL) | INTRAMUSCULAR | Qty: 1

## 2018-07-10 MED FILL — DIPHENHYDRAMINE 25 MG CAPSULE: 25 25 mg | ORAL | Qty: 1

## 2018-07-10 MED FILL — SODIUM CHLORIDE 0.9 % INTRAVENOUS PIGGYBACK: 2.00 2.00 g | INTRAVENOUS | Qty: 100

## 2018-07-10 MED FILL — ENOXAPARIN 40 MG/0.4 ML SUBCUTANEOUS SYRINGE: 40 40 mg/0.4 mL | SUBCUTANEOUS | Qty: 0.4

## 2018-07-10 NOTE — Unmapped (Signed)
Problem: Acute Pain  Patient's pain progressing toward patient's stated pain goal   Goal: Patient will manage pain with the appropriate technique/intervention  Assess and monitor patient's pain using appropriate pain scale. Collaborate with interdisciplinary team and initiate plan and interventions as ordered.  Re-assess patient's pain level 30-60 minutes after pain management intervention.   Outcome: Progressing  Pt c/o throat pain rated 7/10.  Pt was medicated with PRN toradol as per MAR.  Upon reassessment of pain pt stated pain was decreased to 3/10.  Urine labs sent as ordered, LR bolus infusing.  Pt currently resting quietly in bed with eyes closed, call light within reach, RN will continue to monitor.  KRISTIN Marcina Millard

## 2018-07-10 NOTE — Unmapped (Signed)
Pt requesting benadryl to help with sinus drainage and sleep.  RN notified MD, order placed for PRN benadryl and sleep holiday.  Pt medicated with benadryl and sign for sleep holiday hours placed on door.  Pt currently resting in bed reading, call light within reach, RN will continue to monitor.  KRISTIN Marcina Millard

## 2018-07-10 NOTE — Unmapped (Signed)
Gynecologic Oncology Progress Note    Indication for admission:  small bowel obstruction, neutropenic fever   Hospital day:  3    HPI: Kelsey Sullivan is 77 y.o. 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 She is undergoing chemotherapy with Carboplatin and Paclitaxel-weekly. ??She underwent C4D1 07/02/18.   ??  Interval Hx:   NG tube remains in place. This morning, she is feeling better with minimal nausea and pain. She is just feeling sad and upset about having to be hospitalized and have NG tube. She is passing flatus.     O:  Vitals:    07/09/18 1720 07/09/18 1900 07/09/18 1920 07/10/18 0403   BP: 111/65 136/61 134/65 145/79   BP Location:  Right arm Right arm Left arm   Patient Position:  Sitting Sitting Lying   Pulse: 69 71 65 65   Resp: 16 16 16 16    Temp: 97.5 ??F (36.4 ??C) 97.4 ??F (36.3 ??C) 97.1 ??F (36.2 ??C) 97.6 ??F (36.4 ??C)   TempSrc: Oral Oral Oral Oral   SpO2: 100% 100% 100% 97%   Weight:       Height:             Intake/Output Summary (Last 24 hours) at 07/10/18 0817  Last data filed at 07/10/18 0550   Gross per 24 hour   Intake             6514 ml   Output             4400 ml   Net             2114 ml     UOP 700 cc in last 24 hours   NG tube output: 3700 cc in last 24 hours (though suspect inaccurate)     Gen:  NAD  Pulm: no work of breathing  CV:  well perfused  Abd:  Soft, nontender, no guarding, mildly distended  Ext:  Warm, well perfused, palpable peripheral pulses    Labs:  Lab Results   Component Value Date    WBC 2.4 (L) 07/10/2018    HGB 8.7 (L) 07/10/2018    HCT 24.2 (L) 07/10/2018    MCV 99.9 07/10/2018    PLT 160 07/10/2018     Lab Results   Component Value Date    CREATININE 0.51 (L) 07/10/2018    BUN 15 07/10/2018    NA 137 07/10/2018    K 3.7 07/10/2018    CL 110 07/10/2018    CO2 22 07/10/2018       Cultures:  - blood cultures and urine  cultures NGTD    Imaging:  KUB 7/31  FINDINGS:  There is distention of the small bowel with multiple air-fluid levels on the upright view. A fluid and gas level is present in the gastric fundus. Gas appears to extend to the level of the rectum. There are postsurgical changes in the pelvis. Underlying organ shadows are grossly normal.  ??  IMPRESSION:  Nonspecific bowel gas pattern. The presence of multiple air-fluid levels in small bowel raises question of early or partial small bowel obstruction.    CT AP 7/31  FINDINGS:  Lower chest: Dependent atelectasis  ??  Liver: Diffuse low attenuation compatible with hepatic steatosis.  No focal hepatic lesions.  ??  Biliary tree: No intra or  extra-hepatic biliary ductal dilatation. The gallbladder is unremarkable.  ??  Spleen: Not enlarged.   ??  Pancreas: Unremarkable.  ??  Adrenal glands: Unremarkable.  ??  Kidneys/ureters/bladder: The kidneys enhance symmetrically. No focal renal lesions.  No hydronephrosis. The urinary bladder is unremarkable.  ??  Gastrointestinal tract: There is dilation of the small bowel with transition point seen in the anterior mid abdomen near the midline (series 2 image 67 and series 602 image 44). The appendix is normal.  ??  Lymphatics: No abdominal or pelvic lymph nodes are enlarged by size criteria.  ??  Vasculature: Scattered atherosclerotic plaque without aneurysmal dilatation.  ??  Peritoneum: Small amount of free fluid in the abdomen. A para-aortic density with central low attenuation measures 2.5 x 2 cm and is significantly decreased in size from prior.  ??  Abdominal wall/soft tissues: Unremarkable.  ??  Pelvic Organs: The uterus is surgically absent.  ??  Osseous structures: No acute osseous abnormalities or suspicious osseous lesions. Degenerative changes of the lumbar spine.  ??  IMPRESSION:  Small bowel obstruction with transition point in the anterior mid abdomen near the midline. Small amount of free fluid in the abdomen, which is  nonspecific.  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  ??  ??  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 ??She underwent C4D1 07/02/18 of  Carboplatin and Paclitaxel-weekly.  ??  Cardiovascular  No acute concerns  ??  Respiratory  No active concers  ??  FEN/GI/Renal  Small bowel obstruction  - KUB 7/31 dilated bowel loops  - CT AP 7/31: Small bowel obstruction with transition point in the anterior mid abdomen near the midline  - NPO, NG tube placed 8/1   - NG tube output 3.7L recorded in 24 hours (suspect inaccurate record, discussed with RN to keep strict I/Os and subtract out any PO intake before recording)   - D5NS @ 161ml/hr  - zofran, compazine, phenergan for nausea  - Replace electrolytes PRN to maintain K>4, Phos >3, Mag >2  - home med: protonix 40mg  daily    Low UOP  - persistent low UOP since admission, though now improving   - IVFs at 100 cc/hr   - FeNa pre-renal, consistent with poor PO intake prior to admission   - replace NG output with 1L bolus this AM    ID  Neutropenic fever   - Fever 100.9 @0300  8/1 and neutropenic with ANC 522  - blood and urine cultures no growth  - CXR WNL  - cefepime and metronidazole ordered for neutropenic fever   - ANC 522 (8/1)-->1189 (8/2)--> 1582 (8/3)   - discontinue antibiotics this AM as all cultures are negative   ??  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   - Developed a  small bowel obstruction in 03/2017, resolved on its own, with NED   - In 01/2018, she experienced mid abdominal pain, and seen  by PCP and ED, with CT of abdomen and pelvis showing interval development of necrotic retroperitoneal lymphadenopathy with largest node measuring 4.9 ??4.2 x 4.8 cm.   - CA 125 = 197.8 02/18/18. 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 Holding D8 taxol.     Neutropenia, improved   - 7/31, no neutropenia (ANC >2000)  - 8/1 ANC 522, put on neutropenic fever precautions.  - 8/2 ANC 1189  - 8/3 ANC 1582, improved   ??  Anemia due to chemotherapy:  - H/H 9.4/ 26.1 (7/31) --> Hgb 8.1 (8/1) --> Hgb 6.9 (8/2)  - s/p 2u pRBC 8/2 --> Hgb 8.7 on 8/3  - Will continue to monitor  ??  Endocrine  No active concerns  ??  Neuro  Pain Control  - IV dilaudid 0.5-1mg  qhrs PRN, IV Toradol 30mg  q6hrs PRN, Tylenol PRN  ??  MSK  No active concerns    Prophylaxis  DVT  - SCDs  - Lovenox  ??  Dispo:  inpatient  ??  Malachy Mood, MD  OB/GYN PGY-4  [facility] phone [phone]

## 2018-07-11 LAB — BASIC METABOLIC PANEL
Anion Gap: 8 mmol/L (ref 3–16)
BUN: 9 mg/dL (ref 7–25)
CO2: 23 mmol/L (ref 21–33)
Calcium: 8.2 mg/dL (ref 8.6–10.3)
Chloride: 106 mmol/L (ref 98–110)
Creatinine: 0.44 mg/dL (ref 0.60–1.30)
Glucose: 187 mg/dL (ref 70–100)
Osmolality, Calculated: 288 mOsm/kg (ref 278–305)
Potassium: 3.5 mmol/L (ref 3.5–5.3)
Sodium: 137 mmol/L (ref 133–146)
eGFR AA CKD-EPI: >90 See note.
eGFR NONAA CKD-EPI: >90 See note.

## 2018-07-11 LAB — CBC
Hematocrit: 27.2 % — ABNORMAL LOW (ref 35.0–45.0)
Hemoglobin: 9.5 g/dL — ABNORMAL LOW (ref 11.7–15.5)
MCH: 35.3 pg — ABNORMAL HIGH (ref 27.0–33.0)
MCHC: 34.9 g/dL (ref 32.0–36.0)
MCV: 101.1 fL — ABNORMAL HIGH (ref 80.0–100.0)
MPV: 7.9 fL (ref 7.5–11.5)
Platelets: 168 10E3/uL (ref 140–400)
RBC: 2.69 10E6/uL — ABNORMAL LOW (ref 3.80–5.10)
RDW: 22.3 % — ABNORMAL HIGH (ref 11.0–15.0)
WBC: 4.4 10E3/uL (ref 3.8–10.8)

## 2018-07-11 LAB — MAGNESIUM: Magnesium: 1.9 mg/dL (ref 1.5–2.5)

## 2018-07-11 LAB — DIFFERENTIAL
Basophils Absolute: 4 /uL (ref 0–200)
Basophils Relative: 0.1 % (ref 0.0–1.0)
Eosinophils Absolute: 0 /uL (ref 15–500)
Eosinophils Relative: 0 % (ref 0.0–8.0)
Lymphocytes Absolute: 418 /uL (ref 850–3900)
Lymphocytes Relative: 9.5 % (ref 15.0–45.0)
Monocytes Absolute: 286 /uL (ref 200–950)
Monocytes Relative: 6.5 % (ref 0.0–12.0)
Neutrophils Absolute: 3692 /uL (ref 1500–7800)
Neutrophils Relative: 83.9 % (ref 40.0–80.0)
nRBC: 0 /100 WBC (ref 0–0)

## 2018-07-11 LAB — PHOSPHORUS: Phosphorus: 1.5 mg/dL (ref 2.1–4.5)

## 2018-07-11 MED ORDER — sodium chloride (OCEAN, DEEP SEA) 0.65 % nasal spray 1 spray
0.65 | NASAL | Status: AC | PRN
Start: 2018-07-11 — End: 2018-07-14

## 2018-07-11 MED ORDER — potassium chloride (KCl)/Sterile water 100 mL 10 mEq/100 mL IVPB 10 mEq
10 | INTRAVENOUS | Status: AC
Start: 2018-07-11 — End: 2018-07-11
  Administered 2018-07-11 (×3): 10 meq via INTRAVENOUS

## 2018-07-11 MED ORDER — electrolyte-R (pH 7.4) (NORMOSOL-R pH 7.4) 500 mL bolus
Freq: Once | INTRAVENOUS | Status: AC
Start: 2018-07-11 — End: 2018-07-11
  Administered 2018-07-11: 13:00:00 via INTRAVENOUS

## 2018-07-11 MED FILL — DEXAMETHASONE SODIUM PHOSPHATE 4 MG/ML INJECTION SOLUTION: 4 4 mg/mL | INTRAMUSCULAR | Qty: 1

## 2018-07-11 MED FILL — ACETAMINOPHEN 650 MG/20.3 ML ORAL SOLUTION: 650 650 mg/20.3 mL | ORAL | Qty: 20.3

## 2018-07-11 MED FILL — DIPHENHYDRAMINE 25 MG CAPSULE: 25 25 mg | ORAL | Qty: 1

## 2018-07-11 MED FILL — PROTONIX 40 MG INTRAVENOUS SOLUTION: 40 40 mg | INTRAVENOUS | Qty: 40

## 2018-07-11 MED FILL — SALINE MIST 0.65 % NASAL SPRAY AEROSOL: 0.65 0.65 % | NASAL | Qty: 45

## 2018-07-11 MED FILL — POTASSIUM CHLORIDE 10 MEQ/100ML IN STERILE WATER INTRAVENOUS PIGGYBACK: 10 10 mEq/100 mL | INTRAVENOUS | Qty: 100

## 2018-07-11 MED FILL — ENOXAPARIN 40 MG/0.4 ML SUBCUTANEOUS SYRINGE: 40 40 mg/0.4 mL | SUBCUTANEOUS | Qty: 0.4

## 2018-07-11 NOTE — Unmapped (Signed)
Gynecologic Oncology Progress Note    Indication for admission:  small bowel obstruction, neutropenic fever  Admit date: 07/07/2018   Hospital day:  4    HPI: Kelsey Sullivan is 77 y.o. 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 She is undergoing chemotherapy with Carboplatin and Paclitaxel-weekly. ??She underwent C4D1 07/02/18.  She is now admitted for management of small bowel obstruction.   ??  Interval Hx:   NG tube remains in place. She feels well this morning. Denies nausea. She is passing flatus and had a bowel movement this morning.  Denies abdominal pain.      O:  Vitals:    07/10/18 0909 07/10/18 1548 07/10/18 1945 07/11/18 0343   BP: 136/87 171/69 154/78 160/78   BP Location: Right arm Right arm Right arm Left arm   Patient Position: Sitting Sitting Lying Lying   Pulse: 71 66 67 65   Resp: 16 16 16 16    Temp: 97.3 ??F (36.3 ??C) 98.1 ??F (36.7 ??C) 98.2 ??F (36.8 ??C) 97.6 ??F (36.4 ??C)   TempSrc: Oral Oral Oral Oral   SpO2: 98% 94% 100% 93%   Weight:       Height:             Intake/Output Summary (Last 24 hours) at 07/11/18 0645  Last data filed at 07/11/18 0530   Gross per 24 hour   Intake             3351 ml   Output             5110 ml   Net            -1759 ml     UOP 2300 cc in last 24 hours   NG tube output: 2280 cc in last 24 hours     Gen:  NAD  Pulm: no work of breathing  CV:  well perfused  Abd:  Soft, nontender, no guarding, mildly distended  Ext:  Warm, well perfused, palpable peripheral pulses    Labs:  Lab Results   Component Value Date    WBC 4.4 07/11/2018    HGB 9.5 (L) 07/11/2018    HCT 27.2 (L) 07/11/2018    MCV 101.1 (H) 07/11/2018    PLT 168 07/11/2018     Lab Results   Component Value Date    CREATININE 0.44 (L) 07/11/2018    BUN 9 07/11/2018    NA 137 07/11/2018    K 3.5 07/11/2018    CL 106 07/11/2018    CO2 23 07/11/2018        Cultures:  - blood cultures and urine cultures NGTD    Imaging:  KUB 7/31  FINDINGS:  There is distention of the small bowel with multiple air-fluid levels on the upright view. A fluid and gas level is present in the gastric fundus. Gas appears to extend to the level of the rectum. There are postsurgical changes in the pelvis. Underlying organ shadows are grossly normal.  ??  IMPRESSION:  Nonspecific bowel gas pattern. The presence of multiple air-fluid levels in small bowel raises question of early or partial small bowel obstruction.    CT AP 7/31  FINDINGS:  Lower chest: Dependent atelectasis  ??  Liver: Diffuse low attenuation compatible with hepatic steatosis.  No focal hepatic lesions.  ??  Biliary  tree: No intra or extra-hepatic biliary ductal dilatation. The gallbladder is unremarkable.  ??  Spleen: Not enlarged.   ??  Pancreas: Unremarkable.  ??  Adrenal glands: Unremarkable.  ??  Kidneys/ureters/bladder: The kidneys enhance symmetrically. No focal renal lesions.  No hydronephrosis. The urinary bladder is unremarkable.  ??  Gastrointestinal tract: There is dilation of the small bowel with transition point seen in the anterior mid abdomen near the midline (series 2 image 67 and series 602 image 44). The appendix is normal.  ??  Lymphatics: No abdominal or pelvic lymph nodes are enlarged by size criteria.  ??  Vasculature: Scattered atherosclerotic plaque without aneurysmal dilatation.  ??  Peritoneum: Small amount of free fluid in the abdomen. A para-aortic density with central low attenuation measures 2.5 x 2 cm and is significantly decreased in size from prior.  ??  Abdominal wall/soft tissues: Unremarkable.  ??  Pelvic Organs: The uterus is surgically absent.  ??  Osseous structures: No acute osseous abnormalities or suspicious osseous lesions. Degenerative changes of the lumbar spine.  ??  IMPRESSION:  Small bowel obstruction with transition point in the anterior mid abdomen near the midline. Small amount of free  fluid in the abdomen, which is nonspecific.  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  ??  ??  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 ??She underwent C4D1 07/02/18 of  Carboplatin and Paclitaxel-weekly.  ??  Cardiovascular  No acute concerns  ??  Respiratory  No active concers  ??  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   - Developed  a small bowel obstruction in 03/2017, resolved on its own, with NED   - In 01/2018, she experienced mid abdominal pain,  and seen by PCP and ED, with CT of abdomen and pelvis showing interval development of necrotic retroperitoneal lymphadenopathy with largest node measuring 4.9 ??4.2 x 4.8 cm.   - CA 125 = 197.8 02/18/18. 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    Neutropenia, improved   - ANC 522 (8/1)-->1189 (8/2)--> 1582 (8/3) --> 3,692 (8/4)  ??  Anemia due to chemotherapy:  - Hgb 8.1 (8/1) --> Hgb 6.9 (8/2) --> s/p 2u pRBC (8/2) --> Hgb 8.7 on (8/3)  - Hgb improved 9.5 (8/4)   - Will continue to monitor  ??  Endocrine  No active concerns  ??  Neuro  Pain Control  - IV dilaudid 0.5-1mg  qhrs PRN, Tylenol PRN  ??  MSK  No active concerns    Prophylaxis  DVT  - SCDs  - Lovenox  ??  Dispo:  inpatient  ??  Malachy Mood, MD  OB/GYN PGY-4  [facility] phone [phone]

## 2018-07-11 NOTE — Unmapped (Signed)
Clamp trial w/NGT this am x 4hrs-retn'd to  LIWS drained only 30ml (over ) of brownish foamy fluid-notified MD -new orders noted-informed pt-verbalized understanding.Verline Lema RN

## 2018-07-12 LAB — CBC
Hematocrit: 28.6 % (ref 35.0–45.0)
Hemoglobin: 10.1 g/dL (ref 11.7–15.5)
MCH: 35.8 pg (ref 27.0–33.0)
MCHC: 35.3 g/dL (ref 32.0–36.0)
MCV: 101.3 fL (ref 80.0–100.0)
MPV: 7.9 fL (ref 7.5–11.5)
Platelets: 200 10*3/uL (ref 140–400)
RBC: 2.82 10*6/uL (ref 3.80–5.10)
RDW: 22.1 % (ref 11.0–15.0)
WBC: 4.8 10*3/uL (ref 3.8–10.8)

## 2018-07-12 LAB — BASIC METABOLIC PANEL
Anion Gap: 8 mmol/L (ref 3–16)
BUN: 7 mg/dL (ref 7–25)
CO2: 21 mmol/L (ref 21–33)
Calcium: 8.6 mg/dL (ref 8.6–10.3)
Chloride: 107 mmol/L (ref 98–110)
Creatinine: 0.47 mg/dL (ref 0.60–1.30)
Glucose: 168 mg/dL (ref 70–100)
Osmolality, Calculated: 284 mOsm/kg (ref 278–305)
Potassium: 3.7 mmol/L (ref 3.5–5.3)
Sodium: 136 mmol/L (ref 133–146)
eGFR AA CKD-EPI: >90 See note.
eGFR NONAA CKD-EPI: >90 See note.

## 2018-07-12 LAB — MAGNESIUM: Magnesium: 1.8 mg/dL (ref 1.5–2.5)

## 2018-07-12 LAB — DIFFERENTIAL
Basophils Absolute: 10 /uL (ref 0–200)
Basophils Relative: 0.2 % (ref 0.0–1.0)
Eosinophils Absolute: 0 /uL (ref 15–500)
Eosinophils Relative: 0 % (ref 0.0–8.0)
Lymphocytes Absolute: 677 /uL (ref 850–3900)
Lymphocytes Relative: 14.1 % (ref 15.0–45.0)
Monocytes Absolute: 442 /uL (ref 200–950)
Monocytes Relative: 9.2 % (ref 0.0–12.0)
Neutrophils Absolute: 3672 /uL (ref 1500–7800)
Neutrophils Relative: 76.5 % (ref 40.0–80.0)
nRBC: 0 /100 WBC (ref 0–0)

## 2018-07-12 LAB — PHOSPHORUS: Phosphorus: 2 mg/dL (ref 2.1–4.5)

## 2018-07-12 MED ORDER — furosemide (LASIX) tablet 20 mg
20 | Freq: Every day | ORAL | Status: AC
Start: 2018-07-12 — End: 2018-07-13
  Administered 2018-07-12: 20:00:00 20 mg via ORAL

## 2018-07-12 MED ORDER — acetaminophen (TYLENOL) tablet 650 mg
325 | Freq: Four times a day (QID) | ORAL | Status: AC | PRN
Start: 2018-07-12 — End: 2018-07-14
  Administered 2018-07-13: 09:00:00 650 mg via ORAL

## 2018-07-12 MED FILL — PROTONIX 40 MG INTRAVENOUS SOLUTION: 40 40 mg | INTRAVENOUS | Qty: 40

## 2018-07-12 MED FILL — ENOXAPARIN 40 MG/0.4 ML SUBCUTANEOUS SYRINGE: 40 40 mg/0.4 mL | SUBCUTANEOUS | Qty: 0.4

## 2018-07-12 MED FILL — FUROSEMIDE 20 MG TABLET: 20 20 MG | ORAL | Qty: 1

## 2018-07-12 NOTE — Unmapped (Signed)
Gynecologic Oncology Progress Note    Indication for admission:  small bowel obstruction, neutropenic fever  Admit date: 07/07/2018   Hospital day:  5    HPI: Kelsey Sullivan is 77 y.o. 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 She is undergoing chemotherapy with Carboplatin and Paclitaxel-weekly. She underwent C4D1 07/02/18. She is now admitted for management of small bowel obstruction.   ??  Interval Hx:   NG tube remains in place, she has been NPO and her tube has been clamped, now on suction with minimal output. She feels well this morning. Denies nausea. She is passing flatus and had 2 small bowel movements this recently. Denies abdominal pain.      O:  Vitals:    07/11/18 1319 07/11/18 1600 07/11/18 2213 07/12/18 0540   BP: 150/71 159/88 (!) 174/92 172/88   BP Location: Right arm Left arm     Patient Position: Sitting Sitting     Pulse: 63 65 77 75   Resp: 16 16 18 18    Temp: 97.4 ??F (36.3 ??C) 97.9 ??F (36.6 ??C) 97.6 ??F (36.4 ??C) 97.7 ??F (36.5 ??C)   TempSrc: Oral Oral Oral Oral   SpO2: 99% 100% 95% 95%   Weight:       Height:             Intake/Output Summary (Last 24 hours) at 07/12/18 4540  Last data filed at 07/12/18 0557   Gross per 24 hour   Intake             2633 ml   Output             1030 ml   Net             1603 ml     UOP 1000 cc in last 24 hours (0.75cc/kg/hr)  NG tube output: 30 cc in last 24 hours     Gen:  NAD  Pulm: no work of breathing  CV:  well perfused  Abd:  Soft, nontender, no guarding, mildly distended  Ext:  Warm, well perfused, palpable peripheral pulses    Labs:  Lab Results   Component Value Date    WBC 4.8 07/12/2018    HGB 10.1 (L) 07/12/2018    HCT 28.6 (L) 07/12/2018    MCV 101.3 (H) 07/12/2018    PLT 200 07/12/2018     Lab Results   Component Value Date    CREATININE 0.47 (L) 07/12/2018    BUN 7 07/12/2018    NA 136 07/12/2018     K 3.7 07/12/2018    CL 107 07/12/2018    CO2 21 07/12/2018       Cultures:  - Blood cultures NGTD  - Urine cultures <10,000 cfu urogenital flora. No further workup.    Imaging:  KUB 7/31  FINDINGS:  There is distention of the small bowel with multiple air-fluid levels on the upright view. A fluid and gas level is present in the gastric fundus. Gas appears to extend to the level of the rectum. There are postsurgical changes in the pelvis. Underlying organ shadows are grossly normal.  ??  IMPRESSION:  Nonspecific bowel gas pattern. The presence of multiple air-fluid levels in small bowel raises question of early or partial small bowel obstruction.    CT AP 7/31  FINDINGS:  Lower  chest: Dependent atelectasis  ??  Liver: Diffuse low attenuation compatible with hepatic steatosis.  No focal hepatic lesions.  ??  Biliary tree: No intra or extra-hepatic biliary ductal dilatation. The gallbladder is unremarkable.  ??  Spleen: Not enlarged.   ??  Pancreas: Unremarkable.  ??  Adrenal glands: Unremarkable.  ??  Kidneys/ureters/bladder: The kidneys enhance symmetrically. No focal renal lesions.  No hydronephrosis. The urinary bladder is unremarkable.  ??  Gastrointestinal tract: There is dilation of the small bowel with transition point seen in the anterior mid abdomen near the midline (series 2 image 67 and series 602 image 44). The appendix is normal.  ??  Lymphatics: No abdominal or pelvic lymph nodes are enlarged by size criteria.  ??  Vasculature: Scattered atherosclerotic plaque without aneurysmal dilatation.  ??  Peritoneum: Small amount of free fluid in the abdomen. A para-aortic density with central low attenuation measures 2.5 x 2 cm and is significantly decreased in size from prior.  ??  Abdominal wall/soft tissues: Unremarkable.  ??  Pelvic Organs: The uterus is surgically absent.  ??  Osseous structures: No acute osseous abnormalities or suspicious osseous lesions. Degenerative changes of the lumbar  spine.  ??  IMPRESSION:  Small bowel obstruction with transition point in the anterior mid abdomen near the midline. Small amount of free fluid in the abdomen, which is nonspecific.  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  ??  ??  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 ??She underwent C4D1 07/02/18 of Carboplatin and Paclitaxel-weekly.  ??  Cardiovascular  No acute concerns  ??  Respiratory  No active concers  ??  FEN/GI/Renal  Small bowel obstruction  - KUB 7/31 dilated bowel loops  - CT AP 7/31: Small bowel obstruction with transition point in the anterior mid abdomen near the midline  - NPO, NG tube placed 8/1   - NG tube output 30 recorded in 24 hours. Will remove today.    - DC'd IVF  - zofran, compazine, phenergan for nausea  - Replace electrolytes PRN to maintain K>4, Phos >3, Mag >2  - home med: protonix 40mg  daily  - DC'd decadron 4 mg TID for patient preference    Low UOP  - persistent low UOP since admission, though now improving   - IVFs at 100 cc/hr   - FeNa pre-renal, consistent with poor PO intake prior to admission   - 1000 cc UOP in last 24 hours (0.75cc/kg/hr)    ID  Neutropenic fever, resolved   - Fever 100.9 @0300  8/1 and neutropenic with ANC 522  - blood and urine cultures no growth  - CXR WNL  - s/p cefepime and metronidazole for neutropenic fever   - ANC 522 (8/1)-->1189 (8/2)--> 1582 (8/3) --> 3,692 (8/4)  - antibiotics discontinued 8/3 after 48 hours treatment   ??  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   - Developed a small  bowel obstruction in 03/2017, resolved on its own, with NED   - In 01/2018, she experienced mid abdominal pain, and seen by PCP and ED, with CT of abdomen and pelvis showing interval development of necrotic retroperitoneal lymphadenopathy with largest node measuring 4.9 ??4.2 x 4.8 cm.   - CA 125 = 197.8 02/18/18. 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    Neutropenia, improved   - ANC 522 (8/1)-->1189 (8/2)--> 1582 (8/3) --> 3,692 (8/4) --> 3,672 (8/5)  ??  Anemia due to chemotherapy:  - Hgb 8.1 (8/1) --> Hgb 6.9 (8/2) --> s/p 2u pRBC (8/2) --> Hgb 8.7 on (8/3)  - Hgb improved 10.1 (8/5)   - Will continue to monitor  ??  Endocrine  No active concerns  ??  Neuro  Pain Control  - IV dilaudid 0.5-1mg  qhrs PRN, Tylenol PRN  ??  MSK  No active concerns    Prophylaxis  DVT  - SCDs  - Lovenox  ??  Dispo:  inpatient  ??  Vergie Living, MD  Ob/Gyn PGY-1  Gyn Onc pager: [phone]    ATTENDING ADDENDUM    I saw and personally examined the patient today, 07/12/2018. I discussed the findings and therapeutic plan with the patient. I repeated, reviewed and and agree with the history, physical examination, and medical decisions as outlined in Dr.calhoun's note (SEE ABOVE).  I have made changes as required to the medical record above.      Warner Mccreedy, MD    Division of Gynecologic Oncology  [phone]

## 2018-07-12 NOTE — Unmapped (Signed)
Pt stated that she feels like she gained weight. RN weighed pt, it was 140.8 lbs. BLE has +2 pitting edema. BP at that was 174/92. Informed Dr. Michaelle Birks and no order given. Pt requested to decrease IVFL. MD changed it to 75 ml/hr. Pt NGT was hooked back up. Pt is currently resting at this time.

## 2018-07-12 NOTE — Unmapped (Signed)
Paged team because patient's BP was 188/88 and patient is concerned about her 23 weight gain in the five days she has been here. MDs have started patient on lasix. MD said she is not concerned about the BP.

## 2018-07-12 NOTE — Care Coordination-Inpatient (Signed)
Per Gyn/Onc Resident progress note, patient not medically ready for discharge today.  Will continue to follow and assist as needed.        Adele Dan RN CCM  Care Coordination   [phone]

## 2018-07-13 ENCOUNTER — Inpatient Hospital Stay: Admit: 2018-07-13 | Payer: Medicare (Managed Care)

## 2018-07-13 LAB — CBC
Hematocrit: 26.9 % (ref 35.0–45.0)
Hemoglobin: 9.6 g/dL (ref 11.7–15.5)
MCH: 35.9 pg (ref 27.0–33.0)
MCHC: 35.6 g/dL (ref 32.0–36.0)
MCV: 100.8 fL (ref 80.0–100.0)
MPV: 7.7 fL (ref 7.5–11.5)
Platelets: 164 10*3/uL (ref 140–400)
RBC: 2.67 10*6/uL (ref 3.80–5.10)
RDW: 21.9 % (ref 11.0–15.0)
WBC: 4.1 10*3/uL (ref 3.8–10.8)

## 2018-07-13 LAB — BASIC METABOLIC PANEL
Anion Gap: 8 mmol/L (ref 3–16)
BUN: 5 mg/dL (ref 7–25)
CO2: 26 mmol/L (ref 21–33)
Calcium: 8.6 mg/dL (ref 8.6–10.3)
Chloride: 102 mmol/L (ref 98–110)
Creatinine: 0.5 mg/dL (ref 0.60–1.30)
Glucose: 113 mg/dL (ref 70–100)
Osmolality, Calculated: 280 mOsm/kg (ref 278–305)
Potassium: 3.1 mmol/L (ref 3.5–5.3)
Sodium: 136 mmol/L (ref 133–146)
eGFR AA CKD-EPI: >90 See note.
eGFR NONAA CKD-EPI: >90 See note.

## 2018-07-13 LAB — DIFFERENTIAL
Basophils Absolute: 4 /uL (ref 0–200)
Basophils Relative: 0.1 % (ref 0.0–1.0)
Eosinophils Absolute: 21 /uL (ref 15–500)
Eosinophils Relative: 0.5 % (ref 0.0–8.0)
Lymphocytes Absolute: 910 /uL (ref 850–3900)
Lymphocytes Relative: 22.2 % (ref 15.0–45.0)
Monocytes Absolute: 800 /uL (ref 200–950)
Monocytes Relative: 19.5 % (ref 0.0–12.0)
Neutrophils Absolute: 2366 /uL (ref 1500–7800)
Neutrophils Relative: 57.7 % (ref 40.0–80.0)
nRBC: 0 /100 WBC (ref 0–0)

## 2018-07-13 LAB — PHOSPHORUS: Phosphorus: 2 mg/dL (ref 2.1–4.5)

## 2018-07-13 LAB — MAGNESIUM: Magnesium: 1.2 mg/dL (ref 1.5–2.5)

## 2018-07-13 MED ORDER — potassium chloride (KLOR-CON M20) CR tablet 40 mEq
20 | Freq: Once | ORAL | Status: AC
Start: 2018-07-13 — End: 2018-07-13
  Administered 2018-07-13: 14:00:00 40 meq via ORAL

## 2018-07-13 MED ORDER — furosemide (LASIX) tablet 20 mg
20 | Freq: Once | ORAL | Status: AC
Start: 2018-07-13 — End: 2018-07-13
  Administered 2018-07-13: 22:00:00 20 mg via ORAL

## 2018-07-13 MED ORDER — sodium phosphate 20 mmol in sodium chloride 0.9 % 250 mL IVPB
Freq: Once | INTRAVENOUS | Status: AC
Start: 2018-07-13 — End: 2018-07-13

## 2018-07-13 MED ORDER — sod phos di, mono-K phos mono (K-PHOS NEUTRAL) 250 mg tablet Tab 250 mg
250 | ORAL | Status: AC
Start: 2018-07-13 — End: 2018-07-13
  Administered 2018-07-13 (×3): 250 mg via ORAL

## 2018-07-13 MED ORDER — furosemide (LASIX) tablet 20 mg
20 | Freq: Once | ORAL | Status: AC
Start: 2018-07-13 — End: 2018-07-13
  Administered 2018-07-13: 15:00:00 20 mg via ORAL

## 2018-07-13 MED ORDER — magnesium sulfate in sterile water 100 mL IVPB 4 g
4 | INTRAVENOUS | Status: AC
Start: 2018-07-13 — End: 2018-07-13
  Administered 2018-07-13 (×2): 4 g via INTRAVENOUS

## 2018-07-13 MED ORDER — potassium chloride (KCl)/Sterile water 100 mL 10 mEq/100 mL IVPB 10 mEq
10 | INTRAVENOUS | Status: AC
Start: 2018-07-13 — End: 2018-07-13

## 2018-07-13 MED ORDER — potassium chloride (KLOR-CON M20) CR tablet 40 mEq
20 | Freq: Once | ORAL | Status: AC
Start: 2018-07-13 — End: 2018-07-13
  Administered 2018-07-13: 16:00:00 40 meq via ORAL

## 2018-07-13 MED FILL — KLOR-CON M20 MEQ TABLET,EXTENDED RELEASE: 20 20 mEq | ORAL | Qty: 2

## 2018-07-13 MED FILL — ENOXAPARIN 40 MG/0.4 ML SUBCUTANEOUS SYRINGE: 40 40 mg/0.4 mL | SUBCUTANEOUS | Qty: 0.4

## 2018-07-13 MED FILL — VIRT-PHOS NEUTRAL 250 MG TABLET: 250 250 mg | ORAL | Qty: 1

## 2018-07-13 MED FILL — PROTONIX 40 MG INTRAVENOUS SOLUTION: 40 40 mg | INTRAVENOUS | Qty: 40

## 2018-07-13 MED FILL — TYLENOL 325 MG TABLET: 325 325 mg | ORAL | Qty: 2

## 2018-07-13 MED FILL — DIPHENHYDRAMINE 25 MG CAPSULE: 25 25 mg | ORAL | Qty: 1

## 2018-07-13 MED FILL — FUROSEMIDE 20 MG TABLET: 20 20 MG | ORAL | Qty: 1

## 2018-07-13 MED FILL — SODIUM PHOSPHATE 3 MMOL/ML INTRAVENOUS SOLUTION: 3 3 mmol/mL | INTRAVENOUS | Qty: 6.67

## 2018-07-13 MED FILL — MAGNESIUM SULFATE 4 GRAM/100 ML (4 %) IN WATER INTRAVENOUS PIGGYBACK: 4 4 gram/100 mL (4 %) | INTRAVENOUS | Qty: 100

## 2018-07-13 NOTE — Unmapped (Signed)
Care Coordinator spoke with Gyn/Onc Resident regarding anticipated discharge date.  Advised thst Ms. Kretzschmar should be discharge ready tomorrow.     No care coordination needs have been identified at this time.  Will continue to follow, assist if needed.     Adele Dan RN CCM  Care Coordination   [phone]

## 2018-07-13 NOTE — Unmapped (Signed)
Gynecologic Oncology Progress Note    Indication for admission:  small bowel obstruction, neutropenic fever  Admit date: 07/07/2018   Hospital day:  6    HPI: Kelsey Sullivan is 77 y.o. 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 She is undergoing chemotherapy with Carboplatin and Paclitaxel-weekly. She underwent C4D1 07/02/18. She is now admitted for management of small bowel obstruction.   ??  Interval Hx:   Some shortness of breath, ordered CXR. Tolerating a regular full diet. Denies nausea/ vomiting. Denies abdominal pain. Had a loose bowel movement yesterday.     O:  Vitals:    07/12/18 0738 07/12/18 1922 07/12/18 2239 07/13/18 0500   BP: 169/72 188/88 158/83 153/83   BP Location: Right arm Left arm  Right arm   Patient Position: Sitting Sitting  Sitting   Pulse: 61 76 72 75   Resp: 16 17  18    Temp: 97.5 ??F (36.4 ??C) 98 ??F (36.7 ??C)  98.1 ??F (36.7 ??C)   TempSrc: Oral Oral  Oral   SpO2: 97% 100% 95% 95%   Weight:  144 lb 11.2 oz (65.6 kg)     Height:             Intake/Output Summary (Last 24 hours) at 07/13/18 1308  Last data filed at 07/13/18 6578   Gross per 24 hour   Intake             2038 ml   Output             4200 ml   Net            -2162 ml     UOP >50cc/hr      Gen:  NAD  Pulm: no work of breathing  CV:  well perfused  Abd:  Soft, nontender, no guarding, mildly distended  Ext:  Warm, well perfused, palpable peripheral pulses    Labs:  Lab Results   Component Value Date    WBC 4.1 07/13/2018    HGB 9.6 (L) 07/13/2018    HCT 26.9 (L) 07/13/2018    MCV 100.8 (H) 07/13/2018    PLT 164 07/13/2018     Lab Results   Component Value Date    CREATININE 0.47 (L) 07/12/2018    BUN 7 07/12/2018    NA 136 07/12/2018    K 3.7 07/12/2018    CL 107 07/12/2018    CO2 21 07/12/2018       Cultures:  - Blood cultures NGTD  - Urine cultures <10,000 cfu urogenital flora.  No further workup.    Imaging:  KUB 7/31  FINDINGS:  There is distention of the small bowel with multiple air-fluid levels on the upright view. A fluid and gas level is present in the gastric fundus. Gas appears to extend to the level of the rectum. There are postsurgical changes in the pelvis. Underlying organ shadows are grossly normal.  ??  IMPRESSION:  Nonspecific bowel gas pattern. The presence of multiple air-fluid levels in small bowel raises question of early or partial small bowel obstruction.    CT AP 7/31  FINDINGS:  Lower chest: Dependent atelectasis  ??  Liver: Diffuse low attenuation compatible with hepatic steatosis.  No focal hepatic lesions.  ??  Biliary tree: No intra or extra-hepatic biliary ductal dilatation. The gallbladder is unremarkable.  ??  Spleen: Not enlarged.   ??  Pancreas: Unremarkable.  ??  Adrenal glands: Unremarkable.  ??  Kidneys/ureters/bladder: The kidneys enhance symmetrically. No focal renal lesions.  No hydronephrosis. The urinary bladder is unremarkable.  ??  Gastrointestinal tract: There is dilation of the small bowel with transition point seen in the anterior mid abdomen near the midline (series 2 image 67 and series 602 image 44). The appendix is normal.  ??  Lymphatics: No abdominal or pelvic lymph nodes are enlarged by size criteria.  ??  Vasculature: Scattered atherosclerotic plaque without aneurysmal dilatation.  ??  Peritoneum: Small amount of free fluid in the abdomen. A para-aortic density with central low attenuation measures 2.5 x 2 cm and is significantly decreased in size from prior.  ??  Abdominal wall/soft tissues: Unremarkable.  ??  Pelvic Organs: The uterus is surgically absent.  ??  Osseous structures: No acute osseous abnormalities or suspicious osseous lesions. Degenerative changes of the lumbar spine.  ??  IMPRESSION:  Small bowel obstruction with transition point in the anterior mid abdomen near the midline. Small amount of free fluid in the abdomen, which is  nonspecific.  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  ??  ??  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 ??She underwent C4D1 07/02/18 of Carboplatin and Paclitaxel-weekly.  ??  Cardiovascular  No acute concerns  ??  Respiratory  New onset shortness of breath (8/6)  - no chest pain, no cough or sputum  - CXR ordered  - On lasix, clinically stable  ??  FEN/GI/Renal  Small bowel obstruction  - KUB 7/31 dilated bowel loops  - CT AP 7/31: Small bowel obstruction with transition point in the anterior mid abdomen near the midline  - NG tube placed 8/1, removed 8/5  - Now having bowel movements      - DC'd IVF  - zofran, compazine, phenergan for nausea  - Replace electrolytes PRN to maintain K>4, Phos >3, Mag >2  - home med: protonix 40mg  daily  - DC'd decadron 4 mg TID for patient preference    Low UOP  - persistent low UOP since admission, though now improving   - IVFs at 100 cc/hr   - FeNa pre-renal, consistent with poor PO intake prior to admission   - On lasix, UOP> 50cc/hr    ID  Neutropenic fever, resolved   - Fever 100.9 @0300  8/1 and neutropenic with ANC 522  - blood and urine cultures no growth  - CXR WNL  - s/p cefepime and metronidazole for neutropenic fever   - ANC 522 (8/1)-->1189 (8/2)--> 1582 (8/3) --> 3,692 (8/4)  - antibiotics discontinued 8/3 after 48 hours treatment   ??  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   - Developed a small bowel obstruction in 03/2017, resolved on its own, with NED   - In 01/2018, she experienced mid abdominal pain, and seen by PCP and  ED, with CT of abdomen and pelvis showing interval development of necrotic retroperitoneal lymphadenopathy with largest node measuring 4.9 ??4.2 x 4.8 cm.   - CA 125 = 197.8 02/18/18. 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    Neutropenia, improved   - ANC 522 (8/1)-->1189 (8/2)--> 1582 (8/3) --> 3,692 (8/4) --> 3,672 (8/5) -->2,366  ??  Anemia due to chemotherapy:  - Hgb 8.1 (8/1) --> Hgb 6.9 (8/2) --> s/p 2u pRBC (8/2) --> Hgb 8.7 on (8/3)  - Hgb improved 10.1 (8/5)   - Will continue to monitor  ??  Endocrine  No active concerns  ??  Neuro  Pain Control  - IV dilaudid 0.5-1mg  qhrs PRN, Tylenol PRN  ??  MSK  No active concerns    Prophylaxis  DVT  - SCDs  - Lovenox  ??  Dispo:  inpatient  ??  Vergie Living, MD  Ob/Gyn PGY-1  Gyn Onc pager: [phone]    ATTENDING ADDENDUM    I saw and personally examined the patient today, 07/13/18. I discussed the findings and therapeutic plan with the patient. I repeated, reviewed and and agree with the history, physical examination, and medical decisions as outlined in Dr.Kathryn Calhoun's note (SEE ABOVE).  I have made changes as required to the medical record above.      Warner Mccreedy, MD    Division of Gynecologic Oncology  [phone]

## 2018-07-14 LAB — DIFFERENTIAL
Basophils Absolute: 0 /uL (ref 0–200)
Basophils Relative: 0 % (ref 0.0–1.0)
Eosinophils Absolute: 32 /uL (ref 15–500)
Eosinophils Relative: 1 % (ref 0.0–8.0)
Lymphocytes Absolute: 1267 /uL (ref 850–3900)
Lymphocytes Relative: 39.6 % (ref 15.0–45.0)
Monocytes Absolute: 422 /uL (ref 200–950)
Monocytes Relative: 13.2 % (ref 0.0–12.0)
Myelocytes Absolute: 32 /uL (ref 0–0)
Myelocytes Relative: 1 % (ref 0.0–0.0)
Neutrophils Absolute: 1418 /uL (ref 1500–7800)
Neutrophils Relative: 44.3 % (ref 40.0–80.0)
PLT Morphology: NORMAL

## 2018-07-14 LAB — CBC
Hematocrit: 31 % (ref 35.0–45.0)
Hemoglobin: 11.2 g/dL (ref 11.7–15.5)
MCH: 36.2 pg (ref 27.0–33.0)
MCHC: 36 g/dL (ref 32.0–36.0)
MCV: 100.5 fL (ref 80.0–100.0)
MPV: 7.5 fL (ref 7.5–11.5)
Platelets: 195 10*3/uL (ref 140–400)
RBC: 3.08 10*6/uL (ref 3.80–5.10)
RDW: 21.5 % (ref 11.0–15.0)
WBC: 3.2 10*3/uL (ref 3.8–10.8)

## 2018-07-14 LAB — BASIC METABOLIC PANEL
Anion Gap: 11 mmol/L (ref 3–16)
BUN: 9 mg/dL (ref 7–25)
CO2: 28 mmol/L (ref 21–33)
Calcium: 9.2 mg/dL (ref 8.6–10.3)
Chloride: 100 mmol/L (ref 98–110)
Creatinine: 0.55 mg/dL (ref 0.60–1.30)
Glucose: 113 mg/dL (ref 70–100)
Osmolality, Calculated: 287 mOsm/kg (ref 278–305)
Potassium: 4 mmol/L (ref 3.5–5.3)
Sodium: 139 mmol/L (ref 133–146)
eGFR AA CKD-EPI: >90 See note.
eGFR NONAA CKD-EPI: >90 See note.

## 2018-07-14 LAB — PHOSPHORUS: Phosphorus: 3.9 mg/dL (ref 2.1–4.5)

## 2018-07-14 LAB — MAGNESIUM: Magnesium: 1.9 mg/dL (ref 1.5–2.5)

## 2018-07-14 MED ORDER — furosemide (LASIX) tablet 20 mg
20 | Freq: Once | ORAL | Status: AC
Start: 2018-07-14 — End: 2018-07-14
  Administered 2018-07-14: 12:00:00 20 mg via ORAL

## 2018-07-14 MED FILL — FUROSEMIDE 20 MG TABLET: 20 20 MG | ORAL | Qty: 1

## 2018-07-14 MED FILL — ENOXAPARIN 40 MG/0.4 ML SUBCUTANEOUS SYRINGE: 40 40 mg/0.4 mL | SUBCUTANEOUS | Qty: 0.4

## 2018-07-14 MED FILL — PROTONIX 40 MG INTRAVENOUS SOLUTION: 40 40 mg | INTRAVENOUS | Qty: 40

## 2018-07-14 NOTE — Progress Notes (Signed)
Gynecologic Oncology Progress Note    Indication for admission:  small bowel obstruction, neutropenic fever  Admit date: 07/07/2018   Hospital day:  7    HPI: Kelsey Sullivan is 77 y.o. 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 She is undergoing chemotherapy with Carboplatin and Paclitaxel-weekly. She underwent C4D1 07/02/18. She is now admitted for management of small bowel obstruction.     Interval Hx:   Feeling better today. Had some high blood pressures and feels very anxious about it. Tolerating a regular full diet. Denies nausea/ vomiting. Denies abdominal pain, headaches. She had bowel movement x3 yesterday.     O:  Vitals:    07/13/18 2225 07/13/18 2227 07/13/18 2300 07/14/18 0405   BP: 193/81 (!) 180/95 176/87 164/78   BP Location: Left arm Right arm Left arm Left arm   Patient Position: Sitting Sitting Lying    Pulse: 71   64   Resp: 14   18   Temp: 97.7 F (36.5 C)   98 F (36.7 C)   TempSrc: Oral   Oral   SpO2: 97%   93%   Weight:       Height:             Intake/Output Summary (Last 24 hours) at 07/14/18 0733  Last data filed at 07/14/18 0445   Gross per 24 hour   Intake             1099 ml   Output             3900 ml   Net            -2801 ml       Gen:  NAD  Pulm: no work of breathing  CV:  well perfused  Abd:  Soft, nontender, no guarding, mildly distended  Ext:  Warm, well perfused, palpable peripheral pulses    Labs:  Lab Results   Component Value Date    WBC 3.2 (L) 07/14/2018    HGB 11.2 (L) 07/14/2018    HCT 31.0 (L) 07/14/2018    MCV 100.5 (H) 07/14/2018    PLT 195 07/14/2018     Lab Results   Component Value Date    CREATININE 0.55 (L) 07/14/2018    BUN 9 07/14/2018    NA 139 07/14/2018    K 4.0 07/14/2018    CL 100 07/14/2018    CO2 28 07/14/2018       Cultures:  - Blood cultures NGTD  - Urine cultures <10,000 cfu urogenital flora. No  further workup.    Imaging:  KUB 7/31  FINDINGS:  There is distention of the small bowel with multiple air-fluid levels on the upright view. A fluid and gas level is present in the gastric fundus. Gas appears to extend to the level of the rectum. There are postsurgical changes in the pelvis. Underlying organ shadows are grossly normal.    IMPRESSION:  Nonspecific bowel gas pattern. The presence of multiple air-fluid levels in small bowel raises question of early or partial small bowel obstruction.    CT AP 7/31  FINDINGS:  Lower chest: Dependent atelectasis    Liver: Diffuse low attenuation compatible with hepatic steatosis.  No focal hepatic lesions.    Biliary tree: No intra or extra-hepatic biliary ductal dilatation. The gallbladder is unremarkable.  Spleen: Not enlarged.     Pancreas: Unremarkable.    Adrenal glands: Unremarkable.    Kidneys/ureters/bladder: The kidneys enhance symmetrically. No focal renal lesions.  No hydronephrosis. The urinary bladder is unremarkable.    Gastrointestinal tract: There is dilation of the small bowel with transition point seen in the anterior mid abdomen near the midline (series 2 image 67 and series 602 image 44). The appendix is normal.    Lymphatics: No abdominal or pelvic lymph nodes are enlarged by size criteria.    Vasculature: Scattered atherosclerotic plaque without aneurysmal dilatation.    Peritoneum: Small amount of free fluid in the abdomen. A para-aortic density with central low attenuation measures 2.5 x 2 cm and is significantly decreased in size from prior.    Abdominal wall/soft tissues: Unremarkable.    Pelvic Organs: The uterus is surgically absent.    Osseous structures: No acute osseous abnormalities or suspicious osseous lesions. Degenerative changes of the lumbar spine.    IMPRESSION:  Small bowel obstruction with transition point in the anterior mid abdomen near the midline. Small amount of free fluid in the abdomen, which is  nonspecific.  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 She underwent C4D1 07/02/18 of Carboplatin and Paclitaxel-weekly.    Cardiovascular  No acute concerns    Respiratory  New onset shortness of breath (8/6)  - no chest pain, no cough or sputum  - CXR ordered  - On lasix, clinically stable    FEN/GI/Renal  Small bowel obstruction  - KUB 7/31 dilated bowel loops  - CT AP 7/31: Small bowel obstruction with transition point in the anterior mid abdomen near the midline  - NG tube placed 8/1, removed 8/5  - Now having bowel movements      - DC'd IVF  - zofran, compazine, phenergan for nausea  - Replace electrolytes PRN to maintain K>4, Phos >3, Mag >2  - home med: protonix 40mg  daily  - DC'd decadron 4 mg TID for patient preference    Low UOP  - persistent low UOP since admission, though now improving   - FeNa pre-renal, consistent with poor PO intake prior to admission   - concerns for fluid overload and pulmonary edema, s/p lasix x2 with good diuresis     ID  Neutropenic fever, resolved   - Fever 100.9 @0300  8/1 and neutropenic with ANC 522  - blood and urine cultures no growth  - CXR WNL  - s/p cefepime and metronidazole for neutropenic fever   - ANC 522 (8/1)-->1189 (8/2)--> 1582 (8/3) --> 3,692 (8/4)  - antibiotics discontinued 8/3 after 48 hours treatment     Heme/Onc  Stage IIIC 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   - Developed a small bowel obstruction in 03/2017, resolved on its own, with NED   - In 01/2018, she experienced mid abdominal pain, and seen  by PCP and ED, with CT of abdomen and pelvis showing interval development of necrotic retroperitoneal lymphadenopathy with largest node measuring 4.9 4.2 x 4.8 cm.   - CA 125 = 197.8 02/18/18. 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    Neutropenia, improved   - ANC 522 (8/1)-->1189 (8/2)--> 1582 (8/3) --> 3,692 (8/4) --> 3,672 (8/5) -->2,366    Anemia due to chemotherapy:  - Hgb 8.1 (8/1) --> Hgb 6.9 (8/2) --> s/p 2u pRBC (8/2) --> Hgb 8.7 on (8/3)  - Hgb improved 10.1 (8/5)   - Will continue to monitor    Endocrine  No active concerns    Neuro  Pain Control  - IV dilaudid 0.5-1mg  qhrs PRN, Tylenol PRN    MSK  No active concerns    Prophylaxis  DVT  - SCDs  - Lovenox    Dispo:  inpatient    Malachy Mood, MD  OB/GYN PGY-4  GYN-ONC pager: [phone]

## 2018-07-14 NOTE — Unmapped (Signed)
Order noted for discharge.   IMM signed.   No care coordination needs identified.       Adele Dan RN CCM  Care Coordination   [phone]

## 2018-07-14 NOTE — Unmapped (Signed)
Problem: Acute Pain  Patient's pain progressing toward patient's stated pain goal   Goal: Patient verbalizes a reduction in pain level  Pt given discharge instructions and follow up appointment. Pt discharged to home.

## 2018-07-14 NOTE — Progress Notes (Signed)
Paged Gyn/Onc team because patient's blood pressure was 193/81 in the left arm and 180/95 in the right arm while sitting. MD asked that BP be taken again in about 20 minutes. Patient in bed. BP was 176/87. MD comfortable with that and did not treat it.

## 2018-07-14 NOTE — Unmapped (Signed)
[facility]  Inpatient Discharge Summary     Patient: Kelsey Sullivan  Age: 77 y.o.    MRN: [id number]   CSN: [id number]    Date of Admission: 07/07/2018  Date of Discharge: 07/13/2018  Attending Physician: Claretta Fraise Billingsl*   Primary Care Physician: Salina April, MD     Diagnoses Present on Admission     Past Medical History:   Diagnosis Date   ??? Bowel obstruction (CMS Dx) 2018   ??? Ovarian cancer (CMS Dx) 2014        Discharge Diagnoses     Active Hospital Problems    Diagnosis Date Noted   ??? Small bowel obstruction (CMS Dx) [K56.609] 07/09/2018   ??? H/O small bowel obstruction [Z87.19] 07/08/2018      Resolved Hospital Problems    Diagnosis Date Noted Date Resolved   No resolved problems to display.       Operations/Procedures Performed (include dates)     Surgeries:      Lines and tubes:  Patient Lines/Drains/Airways Status    Active Line / PIV Line     Name:   Placement date:   Placement time:   Site:   Days:    Port A Cath Right Chest          Chest                    Other Procedures / Pertinent Imaging:  KUB (7/31)  FINDINGS:  ??There is distention of the small bowel with multiple air-fluid levels on the upright view. A fluid and gas level is present in the gastric fundus. Gas appears to extend to the level of the rectum.  ??  There are postsurgical changes in the pelvis. Underlying organ shadows are grossly normal.  ??  IMPRESSION:  Nonspecific bowel gas pattern. The presence of multiple air-fluid levels in small bowel raises question of early or partial small bowel obstruction.      CT AP (7/31)  FINDINGS:  Lower chest: Dependent atelectasis  ??  Liver: Diffuse low attenuation compatible with hepatic steatosis.  No focal hepatic lesions.  ??  Biliary tree: No intra or extra-hepatic biliary ductal dilatation. The gallbladder is unremarkable.  ??  Spleen: Not enlarged.   ??  Pancreas: Unremarkable.  ??  Adrenal glands: Unremarkable.  ??  Kidneys/ureters/bladder: The kidneys enhance symmetrically. No focal  renal lesions.  No hydronephrosis. The urinary bladder is unremarkable.  ??  Gastrointestinal tract: There is dilation of the small bowel with transition point seen in the anterior mid abdomen near the midline (series 2 image 67 and series 602 image 44). The appendix is normal.  ??  Lymphatics: No abdominal or pelvic lymph nodes are enlarged by size criteria.  ??  Vasculature: Scattered atherosclerotic plaque without aneurysmal dilatation.  ??  Peritoneum: Small amount of free fluid in the abdomen. A para-aortic density with central low attenuation measures 2.5 x 2 cm and is significantly decreased in size from prior.  ??  Abdominal wall/soft tissues: Unremarkable.  ??  Pelvic Organs: The uterus is surgically absent.  ??  Osseous structures: No acute osseous abnormalities or suspicious osseous lesions. Degenerative changes of the lumbar spine.  ??  IMPRESSION:  ??  Small bowel obstruction with transition point in the anterior mid abdomen near the midline.  Small amount of free fluid in the abdomen, which is nonspecific.  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            Lab Review:  Lab Results   Component Value Date    WBC 4.1 07/13/2018    HGB 9.6 (L) 07/13/2018    HCT 26.9 (L) 07/13/2018    MCV 100.8 (H) 07/13/2018    PLT 164 07/13/2018        Lab Results   Component Value Date    CREATININE 0.50 (L) 07/13/2018    NA 136 07/13/2018    K 3.1 (L) 07/13/2018    CL 102 07/13/2018    CO2 26 07/13/2018         Consulting Services (include reason)       none    Allergies     Allergies   Allergen Reactions   ??? Bactrim [Sulfamethoxazole-Trimethoprim] Other (See Comments)     Hallucinations, anxiety   ??? Nsaids (Non-Steroidal Anti-Inflammatory Drug)      Stomach ache   ??? Celecoxib Nausea And Vomiting   ??? Metoclopramide Other (See Comments)     Does not remember       Discharge Medications     Current Discharge Medication List      CONTINUE these  medications which have NOT CHANGED    Details   dexamethasone (DECADRON) 4 MG tablet Take 8 mg by mouth daily with food on day 2, 3, and 4 of chemotherapy cycle  Qty: 18 tablet, Refills: 6    Associated Diagnoses: Malignant neoplasm of both ovaries (CMS Dx)      digestive enzymes Cap Take by mouth.      ergocalciferol, vitamin D2, (VITAMIN D ORAL) Take 10,000 Units by mouth daily.             ibuprofen (ADVIL,MOTRIN) 200 MG tablet Take 200-800 mg by mouth every 8 hours as needed for Pain.      ibuprofen-diphenhydramine HCl 200-25 mg Cap Take 1 tablet by mouth at bedtime as needed.      Lactobac no.41/Bifidobact no.7 (PROBIOTIC-10 ORAL) Take 1 capsule by mouth daily. [employer] brand      lidocaine-prilocaine (EMLA) cream Apply topically daily as needed. Apply pea-size amount to port site 30-60 min prior to accessing port  Qty: 30 g, Refills: 1      MAGNESIUM SULFATE TOP Apply topically daily.      omeprazole (PRILOSEC) 20 MG capsule Take 20 mg by mouth daily.             ondansetron (ZOFRAN) 4 MG tablet Take 1 tablet (4 mg total) by mouth every 8 hours as needed for Nausea. Must wait to take until 5 days after receiving Aloxi  Qty: 20 tablet, Refills: 2      proCHLORPERazine (COMPAZINE) 10 MG tablet Take 1 tablet (10 mg total) by mouth every 6 hours as needed (For nausea and vomiting).  Qty: 30 tablet, Refills: 3    Associated Diagnoses: Malignant neoplasm of both ovaries (CMS Dx)      vit b complex w-b 12 (B COMPLEX-VITAMIN B12) tablet Take 1 tablet by mouth daily.      vitamin E 100 UNIT capsule Take 100 Units by mouth daily.             Discharge Exam     See note from date of discharge    Reason for Admission     Kelsey Sullivan is a 77 y.o. female who presented for surgery as above.       Hospital Course  On 7/31 she experienced abdominal pain at home that was sharp on her LLQ and had significant diarrhea and one episode of emesis. She came to be evaluated at the [facility] Onc clinic. She has a history of  prior small bowel obstruction. She was give IL NS, 2mg  morphine IV, and 4mg  Zofran IV in clinic and admitted for further observation. The abdominal XR showed dilated bowel loops, concerning for obstruction. CT abdomen/pelvis showed dilation of the small bowel with transition point seen in the anterior mid abdomen near the midline.  ??  Overnight at 3am on 8/1 she was found to be febrile to 100.9, and labs showed WBC 0.8 ANC 522. She was put on neutropenic fever precautions. She was given a dose of IV cefepime. She had one emesis overnight and was given a dose of phenergan. Blood and urine cultures are pending. CXR showed clear lungs. At 6am, her T was 99.6. Her antibiotics were broadened to include metronidazole. She remained afebrile for the remaining days of her admission. Her blood cultures showed no growth to date. Her urine culture grew mixed urogenital flora, which was not concerning.    Her small bowel obstruction was treated conservatively with NPO diet and NG tube, placed 0930 on 8/1. On initial placement, the NG emitted about of bilious gastric contents, and continued to put out several liters over the next few days. After a reduction in NG output and tolerating a clamping trial, her NG tube was removed on 8/5. She was able to advance her diet with nausea/vomiting. Her bowel function had returned.??She was ambulating and voiding without difficulty.     During admission she also had low urine output. She received several fluid boluses, and her FeNa was calculated to be 0.7%, which demonstrated a pre-renal etiology, likely a result of low intake prior to admission. Her urine output improved to >50cc/hr.     She had HTN the last day of her admission. This is likely due to fluid overload from the replacements she received while NPO/ An EKG was performed. She was advised her BP would likely normalize as she continues to  Mobilize fluid (she did get lasix X3 during her stay). She was very concerned about her  BP, and planned to see her PCP the day after DC.       On the date of discharge she had remained afebrile, her vital signs were stable and her abdomen was soft and nontender to palpation.      Discharge instructions were reviewed and further follow up is planned in the [facility].  Medications are per the medical reconciliation sheet.     She was discharged home in stable condition.           Condition on Discharge     1. Functional Status: normal   Describe limitations, if any: none    2. Mental Status: Alert/Oriented   Describe limitations, if any: none    3. Dietary Restrictions / Tube Feeding / TPN  Diet Orders          Diet regular starting at 08/06 0701        As listed above    4. Discharge specific orders:   DISCHARGE INSTRUCTIONS      Since you are continuing your recovery in the comfort of your home, here are some points to keep in mind during your recovery:    ?? Pain should be controlled by pain medication and should improve over time.   ??  Please follow your medication instructions carefully.   ?? A small amount of bleeding or drainage at the operative is normal, and to be expected.    ?? No driving while on pain medications.  ?? There are no specific diet limitations.   ?? If nausea is present, limit yourself to liquids or semi-soft foods.     Call your physician if:    ?? You experience excessive nausea and vomiting.   ?? You experience excessive pain, which is not controlled by prescribed medication.  ?? You experience a large amount of bleeding or drainage from the area of your surgery.   ?? If you have a fever over 100.8 degrees.    Additional Instructions:    ?? No Douches, tampons or intercourse for 6 weeks.  ?? You may have a bloody discharge at times. Please call if you are having excessive bleeding (changing a pad an hour over 2-3 hours).   ?? You may climb stairs with care and moderation.  ?? You may take showers, but no tub baths for 6 weeks.   ?? No heavy lifting (no more than 15 pounds) for 6  weeks.    See Dr. [name]  as scheduled in your preop packet      5. Core measures followed: (if this is a core measure patient)  Discharge Weight: 139 lb 1.6 oz (63.1 kg)          Disposition     Home with assistance      Follow-Up Appointments     Future Appointments  Date Time Provider Department Center   07/30/2018 9:00 AM Vincenza Hews, CNP [facility]   07/30/2018 10:00 AM CHAIR [facility] INF 3 [facility]     No follow-up provider specified.    Signed:    Mauri Brooklyn, MD  07/13/2018, 7:54 AM     .[email or web address]

## 2018-07-15 NOTE — Telephone Encounter (Signed)
Patient called to report the following problems:    Stated that she has been feeling very fatigued and like she is breathing heavy. She stated that when she gets up and gets moving, she feels more winded. She stated she looked at her CT's and Chest Xray and she had pulmonary edema and pleural effusions and was very upset by this. Advised her that Dr. [name] had shared that she had given you lasix to also help with this and the swelling in your legs. Pt stated she wore compression hose since last night and the swelling is down. Also patient stated that her last bowel obstruction was in a different place then this one and she wants to know why. Advised her that we do not know the exact reason but that it could be due to adhesions or to her disease. Pt stated she requested a consult with cardiology due to her blood pressure and her pulmonary edema while she was in the hospital and was denied. She stated she is gong to see her PCP today to discuss all of this with him and see what he thinks. She will call us back later. Did advise her that we would definitely like to see her tomorrow before chemo to assess if she can get it but that if she is feeling poorly today we would like to see her today. Pt stated she would rather go to her PCP.

## 2018-07-16 ENCOUNTER — Ambulatory Visit: Admit: 2018-07-16 | Payer: Medicare (Managed Care) | Attending: Gerontology

## 2018-07-16 ENCOUNTER — Ambulatory Visit: Admit: 2018-07-16 | Payer: Medicare (Managed Care)

## 2018-07-16 ENCOUNTER — Other Ambulatory Visit: Admit: 2018-07-16 | Payer: Medicare (Managed Care)

## 2018-07-16 DIAGNOSIS — C569 Malignant neoplasm of unspecified ovary: Secondary | ICD-10-CM

## 2018-07-16 DIAGNOSIS — C563 Malignant neoplasm of bilateral ovaries (CMS-HCC): Secondary | ICD-10-CM

## 2018-07-16 DIAGNOSIS — Z5111 Encounter for antineoplastic chemotherapy: Secondary | ICD-10-CM

## 2018-07-16 LAB — CBC
Hematocrit: 31.6 % (ref 35.0–45.0)
Hemoglobin: 11 g/dL (ref 11.7–15.5)
MCH: 35.2 pg (ref 27.0–33.0)
MCHC: 34.8 g/dL (ref 32.0–36.0)
MCV: 101.4 fL (ref 80.0–100.0)
MPV: 7.8 fL (ref 7.5–11.5)
Platelets: 200 10*3/uL (ref 140–400)
RBC: 3.11 10*6/uL (ref 3.80–5.10)
RDW: 20.8 % (ref 11.0–15.0)
WBC: 5.7 10*3/uL (ref 3.8–10.8)

## 2018-07-16 LAB — DIFFERENTIAL
Basophils Absolute: 29 /uL (ref 0–200)
Basophils Relative: 0.5 % (ref 0.0–1.0)
Eosinophils Absolute: 23 /uL (ref 15–500)
Eosinophils Relative: 0.4 % (ref 0.0–8.0)
Lymphocytes Absolute: 1334 /uL (ref 850–3900)
Lymphocytes Relative: 23.4 % (ref 15.0–45.0)
Monocytes Absolute: 593 /uL (ref 200–950)
Monocytes Relative: 10.4 % (ref 0.0–12.0)
Neutrophils Absolute: 3722 /uL (ref 1500–7800)
Neutrophils Relative: 65.3 % (ref 40.0–80.0)
nRBC: 0 /100 WBC (ref 0–0)

## 2018-07-16 LAB — COMPREHENSIVE METABOLIC PANEL
ALT: 44 U/L (ref 7–52)
AST: 26 U/L (ref 13–39)
Albumin: 3.9 g/dL (ref 3.5–5.7)
Alkaline Phosphatase: 75 U/L (ref 36–125)
Anion Gap: 9 mmol/L (ref 3–16)
BUN: 13 mg/dL (ref 7–25)
CO2: 26 mmol/L (ref 21–33)
Calcium: 9.6 mg/dL (ref 8.6–10.3)
Chloride: 99 mmol/L (ref 98–110)
Creatinine: 0.58 mg/dL (ref 0.60–1.30)
Glucose: 152 mg/dL (ref 70–100)
Osmolality, Calculated: 281 mOsm/kg (ref 278–305)
Potassium: 4 mmol/L (ref 3.5–5.3)
Sodium: 134 mmol/L (ref 133–146)
Total Bilirubin: 0.5 mg/dL (ref 0.0–1.5)
Total Protein: 6.3 g/dL (ref 6.4–8.9)
eGFR AA CKD-EPI: >90 See note.
eGFR NONAA CKD-EPI: 90 See note.

## 2018-07-16 LAB — URINALYSIS, REFLEX TO CULTURE
Bilirubin, UA: NEGATIVE
Blood, UA: NEGATIVE
Glucose, UA: NEGATIVE mg/dL
Ketones, UA: NEGATIVE mg/dL
Leukocytes, UA: NEGATIVE
Nitrite, UA: NEGATIVE
Protein, UA: NEGATIVE mg/dL
Specific Gravity, UA: 1.005 (ref 1.005–1.035)
Urobilinogen, UA: <2 mg/dL (ref 0.2–1.9)
pH, UA: 8 (ref 5.0–8.0)

## 2018-07-16 LAB — CA 125: CA 125: 28.5 U/mL (ref 5.5–35.0)

## 2018-07-16 LAB — MAGNESIUM: Magnesium: 1.4 mg/dL (ref 1.5–2.5)

## 2018-07-16 MED ORDER — diphenhydrAMINE (BENADRYL) injection 25 mg
50 | Freq: Every day | INTRAMUSCULAR | Status: AC | PRN
Start: 2018-07-16 — End: 2018-07-16

## 2018-07-16 MED ORDER — hydrocortisone sod succ (PF)(SOLU-CORTEF) 100 mg injection
100 | Freq: Every day | INTRAMUSCULAR | Status: AC | PRN
Start: 2018-07-16 — End: 2018-07-16

## 2018-07-16 MED ORDER — heparin lock flush Syrg 500 Units
100 | Freq: Every day | INTRAVENOUS | Status: AC | PRN
Start: 2018-07-16 — End: 2018-07-16
  Administered 2018-07-16: 22:00:00 500 [IU]

## 2018-07-16 MED ORDER — albuterol (PROVENTIL;VENTOLIN;PROAIR) inhaler 1-2 puff
90 | RESPIRATORY_TRACT | Status: AC | PRN
Start: 2018-07-16 — End: 2018-07-16

## 2018-07-16 MED ORDER — famotidine (PF) (PEPCID) injection 20 mg
20 | Freq: Once | INTRAVENOUS | Status: AC
Start: 2018-07-16 — End: 2018-07-16
  Administered 2018-07-16: 20:00:00 20 mg via INTRAVENOUS

## 2018-07-16 MED ORDER — LORazepam (ATIVAN) tablet 0.5 mg
0.5 | Freq: Four times a day (QID) | ORAL | Status: AC | PRN
Start: 2018-07-16 — End: 2018-07-16

## 2018-07-16 MED ORDER — diphenhydrAMINE (BENADRYL) capsule 25 mg
25 | Freq: Once | ORAL | Status: AC | PRN
Start: 2018-07-16 — End: 2018-07-16

## 2018-07-16 MED ORDER — sodium chloride flush 10 mL
Freq: Every day | INTRAMUSCULAR | Status: AC | PRN
Start: 2018-07-16 — End: 2018-07-16
  Administered 2018-07-16: 22:00:00 10 mL via INTRAVENOUS

## 2018-07-16 MED ORDER — sodium chloride 0.9 % infusion
Freq: Every day | INTRAVENOUS | Status: AC | PRN
Start: 2018-07-16 — End: 2018-07-16

## 2018-07-16 MED ORDER — famotidine (PEPCID) tablet 20 mg
20 | Freq: Once | ORAL | Status: AC | PRN
Start: 2018-07-16 — End: 2018-07-16

## 2018-07-16 MED ORDER — diphenhydrAMINE (BENADRYL) injection 25 mg
50 | Freq: Once | INTRAMUSCULAR | Status: AC
Start: 2018-07-16 — End: 2018-07-16
  Administered 2018-07-16: 20:00:00 25 mg via INTRAVENOUS

## 2018-07-16 MED ORDER — dexAMETHasone (DECADRON) tablet 4 mg
4 | Freq: Once | ORAL | Status: AC
Start: 2018-07-16 — End: 2018-07-16
  Administered 2018-07-16: 20:00:00 4 mg via ORAL

## 2018-07-16 MED ORDER — EPINEPHrine 1 mg/mL injection
1 | Freq: Every day | INTRAMUSCULAR | Status: AC | PRN
Start: 2018-07-16 — End: 2018-07-16

## 2018-07-16 MED ORDER — PACLitaxel (TAXOL) 120 mg in sodium chloride 0.9 % 250 mL chemo infusion
6 | Freq: Once | INTRAVENOUS | Status: AC
Start: 2018-07-16 — End: 2018-07-16
  Administered 2018-07-16: 21:00:00 120 mg/m2 via INTRAVENOUS

## 2018-07-16 MED FILL — DEXAMETHASONE 4 MG TABLET: 4 4 MG | ORAL | Qty: 1

## 2018-07-16 MED FILL — FAMOTIDINE (PF) 20 MG/2 ML INTRAVENOUS SOLUTION: 20 20 mg/2 mL | INTRAVENOUS | Qty: 2

## 2018-07-16 MED FILL — PACLITAXEL 6 MG/ML CONCENTRATE,INTRAVENOUS: 6 6 mg/mL | INTRAVENOUS | Qty: 20

## 2018-07-16 MED FILL — DIPHENHYDRAMINE 50 MG/ML INJECTION SOLUTION: 50 50 mg/mL | INTRAMUSCULAR | Qty: 1

## 2018-07-16 NOTE — Unmapped (Signed)
Pt arrived ambulatory to infusion area for scheduled tx. Pt arrived with port having been accessed in port room; positive blood return. Administered D15 C4 of Taxol as ordered without incident. Pt tolerated all well and denied other needs; AVS given to pt and pt verbalized understanding to upcoming appts. Port flushed with NS 10 ml and 5 ml Heparin 100 units/ml. Positive blood return noted. Port deaccessed and gauze/band-aid applied. Discharged ambulatory with daughter.

## 2018-07-16 NOTE — Unmapped (Deleted)
History of Present Illness  Kelsey Sullivan is a 77 y.o. female with   Chief Complaint   Patient presents with   ??? 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 She declined adjuvant therapy. She used natural methods, went to a raw diet for a year, etc.  She developed a small bowel obstruction in 03/2017, resolved on its own, with NED at that time.  In 01/2018, she experienced mid abdominal pain, and seen by PCP and ED, with CT of abdomen and pelvis showing interval development of necrotic retroperitoneal lymphadenopathy with largest node measuring 4.9  4.2 x 4.8 cm. Her CA 125 on 02/18/18 was 197.8.  She underwent a ct-guided nodal biopsy on 02/26/18, with pathology confirming recurrent disease.  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 She was seen at [facility] cancer in [place] with Dr. Ishmael Holter. She is undergoing chemotherapy with Carboplatin and Paclitaxel-weekly.        Kelsey Sullivan is here for cycle 4 day 15 after recent hospital admission for bowel obstruction  She denies any bloating or fullness.  She reports normal bowel/bladder habits.   She denies any GU concerns. Denies any dysuria, frequency, hematuria, flank pain or fevers.  She denies any vaginal bleeding or  discharge.   She denies neuropathy.   She reports no leg swelling.   Her appetite is good, and reports no nausea or vomiting. Weight stable now at 121 lbs after diuresis          Patient Active Problem List    Diagnosis   ??? Small bowel obstruction (CMS Dx)   ??? H/O small bowel obstruction   ??? 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 Pt declined adjuvant therapy      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     Posterior cul de sac biopsy-metastatic adenocarcinoma.     Omentectomy- metastatic adenocarcinoma     LND-negative     Residual right IP ligament, Anterior cul de sac biopsy, right pelvic side will biopsy, left pelvic side wall, right gutter and left gutter-->benign  04/06/2017:   CT [name]- mild small bowel obstruction with transition point in the left pelvis adjacent to the anterior abdominal wall. Low density in the common femoral veins bilaterally thought likely to represent flow artificat, may consider follow up with lomer extremity Doppler to ensure there is no underlying deep venous thrombosis.    01/29/2018:   CTAP:  Interval development of necrotic retroperitoneal lymphadenopathy with largest node measuring 4.9  4.2 x 4.8 cm. Origin is indeterminate.  Options would include CT directed biopsy as well as PET/CT.    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  No other focal areas of FD avid malignancy are found. Specifically, no lymph node activity is found outside of the retroperitoneum and there is no evidence for FDG avid pulmonary, skeletal or hepatic parenchymal metastasis.    02/2018:   Patient saw Dr. Ishmael Holter who recommended Carbo/Taxol with an AUC of 6.  Dr. Maren Reamer office submitted foundation one testing. Patient would like Genetic Testing    04/12/2018:   IR port placed    04/16/2018:   C1 weekly taxol 80 mg/m2 D1,8,15, Carboplatin AUC 6 D1 q 21 days. PCEs on thurs, txt on fridays. EXAMS ON EVEN CYCLES     CA 125: 523.4     Genetic testing: Neg for CS mutation, no VUS, lifetime remaining breast cancer risk -3.4%    05/07/2018:   C2 weekly taxol 80 mg/m2 D1,8,15, Carboplatin AUC 6 D1 q 21 days. PCEs on thurs, txt on fridays. EXAMS ON EVEN CYCLES     CA 125: 162.2    05/20/2018:   Early PCE due to scheduling, due 6/21    05/28/2018  Delay C3 due to low ANC     CA 125: 25    06/04/2018  C3 weekly taxol 80 mg/m2 D1,8,15, Carboplatin AUC 6 D1 q 21 days. EXAMS ON EVEN CYCLES.      CA 125    06/25/2018:   HOLD chemo due to low anc (1047)- defer to next week, and change to q 28 days cycle to build in an off week to allow for counts to recover     CA 125: 10.6    07/02/2018:   C4 weekly taxol 80 mg/m2 D1,8,15, Carboplatin AUC 6 D1 q 28 days. ANC still lowish 1480s- start neulasta Day 16      EXAMS ON EVEN CYCLES.  Review her genetics testing results      CA 125 7.2    07/07/2018:   [facility] admission: SBO, neutropenic fever     CTAP:Small bowel obstruction with transition point in the anterior mid abdomen near the midline.  Small amount of free fluid in the abdomen, which is nonspecific. 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     Peritoneum: Small amount of free fluid in the abdomen. A para-aortic density with central low attenuation measures 2.5 x 2 cm and is significantly decreased in size from prior.      Disposition: chemo, imaging after 3 cucles, CA 125, possible  parp maintenance after, genetic testing pending, foundation one testing results from [facility] in [place]  Current disease status: Neg for CS mutation, no VUS, lifetime remaining breast cancer risk -3.4%  Genetics: negative for mutations, breast cancer lifetime risk 3.4%  Survivorship plan: pending completion of treatment              Past Medical History  She  has a past medical history of Bowel obstruction (CMS Dx) (2018) and Ovarian cancer (CMS Dx) (2014).  Past Medical History:   Diagnosis Date   ??? Bowel obstruction (CMS Dx) 2018   ??? Ovarian cancer (CMS Dx) 2014       Past Surgical History  Past Surgical History:   Procedure Laterality Date   ??? ABDOMINOPLASTY  1999   ??? BLEPHAROPLASTY  2000   ??? DILATION AND CURETTAGE OF UTERUS  1979   ??? ELBOW SURGERY  1990   ??? EYE SURGERY  1994   ??? KNEE ARTHROPLASTY Right    ??? ROTATOR CUFF REPAIR     ??? TRANSUMBILICAL AUGMENTATION MAMMAPLASTY     ??? VAGINAL HYSTERECTOMY  1979       Family History  History reviewed. No pertinent family history.    Family cancer history for ovarian, uterine and colon cancer is negative other than above.       Social History  Social History     Social History   ??? Marital status: Divorced     Spouse name: N/A   ??? Number of children: N/A   ??? Years of education: N/A     Social History Main Topics   ??? Smoking status: Former Smoker   ??? Smokeless tobacco: Never Used   ??? Alcohol use Yes      Comment: 2-3 drinks a day   ??? Drug use: No   ??? Sexual activity: Not Asked     Other Topics Concern   ??? Caffeine Use No   ??? Occupational Exposure No   ???  Exercise Yes   ??? Seat Belt Yes     Social History Narrative    Mammogram-6 years ago and it was normal. Pt stated she isn't getting them anymore.      Colonoscopy-10 years ago        Pt reports she can lie flat in bed without SOB.    Pt states she can walk a flight of stairs and city block without chest pain and SOB>         Past OB/GYN History  G3P2  She reports menarche at age 66 and menopause at age 73-surgical.  She denies a history of STIs.  She denies a history of abnormal cervical cytology and reports her last cytologic examination she is unsure of. She has taken HRT.     Health maintenance:  Mammogram: Date 6 years ago Results normal  Colonoscopy: Date 10 years ago Results normal    Allergies  She is allergic to bactrim [sulfamethoxazole-trimethoprim]; nsaids (non-steroidal anti-inflammatory drug); celecoxib; and metoclopramide.    BMI  Body mass index is 20.77 kg/m??.    Disease Status: Systemic recurrence of disease (04/01/2018 12:43 PM)          Histories  She has a past medical history of Bowel obstruction (CMS Dx) (2018) and Ovarian cancer (CMS Dx) (2014).    She has a past surgical history that includes Dilation and curettage of uterus (1979); Vaginal hysterectomy (1979); Elbow surgery (1990); Eye surgery (1994); Abdominoplasty (1999); Blepharoplasty (2000); Transumbilical augmentation mammaplasty; Rotator cuff  repair; and Knee Arthroplasty (Right).    Her family history is not on file.    She reports that she has quit smoking. She has never used smokeless tobacco. She reports that she drinks alcohol. She reports that she does not use drugs.    Allergies  Bactrim [sulfamethoxazole-trimethoprim]; Nsaids (non-steroidal anti-inflammatory drug); Celecoxib; and Metoclopramide    Medications  Outpatient Encounter Prescriptions as of 07/16/2018   Medication Sig Dispense Refill   ??? dexamethasone (DECADRON) 4 MG tablet Take 8 mg by mouth daily with food on day 2, 3, and 4 of chemotherapy cycle 18 tablet 6   ???  digestive enzymes Cap Take by mouth.     ??? ergocalciferol, vitamin D2, (VITAMIN D ORAL) Take 10,000 Units by mouth daily.            ??? ibuprofen (ADVIL,MOTRIN) 200 MG tablet Take 200-800 mg by mouth every 8 hours as needed for Pain.     ??? ibuprofen-diphenhydramine HCl 200-25 mg Cap Take 1 tablet by mouth at bedtime as needed.     ??? Lactobac no.41/Bifidobact no.7 (PROBIOTIC-10 ORAL) Take 1 capsule by mouth daily. [employer] brand     ??? lidocaine-prilocaine (EMLA) cream Apply topically daily as needed. Apply pea-size amount to port site 30-60 min prior to accessing port 30 g 1   ??? MAGNESIUM SULFATE TOP Apply topically daily.     ??? omeprazole (PRILOSEC) 20 MG capsule Take 20 mg by mouth daily.            ??? ondansetron (ZOFRAN) 4 MG tablet Take 1 tablet (4 mg total) by mouth every 8 hours as needed for Nausea. Must wait to take until 5 days after receiving Aloxi 20 tablet 2   ??? proCHLORPERazine (COMPAZINE) 10 MG tablet Take 1 tablet (10 mg total) by mouth every 6 hours as needed (For nausea and vomiting). 30 tablet 3   ??? vit b complex w-b 12 (B COMPLEX-VITAMIN B12) tablet Take 1 tablet by mouth daily.     ??? vitamin E 100 UNIT capsule Take 100 Units by mouth daily.       No facility-administered encounter medications on file as of 07/16/2018.         Review of Systems   Constitutional: Negative for activity change, appetite change, chills, fatigue, fever, weight gain and weight loss.   HENT: Negative for congestion, mouth sores, rhinorrhea, sinus pressure, sore throat and trouble swallowing.    Eyes: Negative for photophobia, redness, itching and visual disturbance.   Respiratory: Negative for apnea, cough, chest tightness and shortness of breath.    Cardiovascular: Negative for chest pain, palpitations and leg swelling.   Gastrointestinal: Negative for abdominal distention, abdominal pain ( intermittent, improving), anal bleeding, bloating, blood in stool, constipation, diarrhea, nausea and vomiting.   Genitourinary:  Negative for decreased urine volume, difficulty urinating, dysuria, frequency, genital sores, hematuria, pelvic pain, urgency, vaginal bleeding, vaginal discharge and vaginal pain.   Musculoskeletal: Negative for arthralgias, back pain, joint swelling and neck pain.   Skin: Negative for color change, pallor and wound.   Neurological: Negative for dizziness, seizures, weakness, numbness and headaches.   Hematological: Negative for adenopathy. Does not bruise/bleed easily.   Psychiatric/Behavioral: Negative for agitation, confusion and depression. The patient is not nervous/anxious.        Vitals  Blood pressure 130/77, pulse 98, temperature 98.3 ??F (36.8 ??C), temperature source Oral, resp. rate 18, height 5' 4 (1.626 m), weight 121 lb (54.9 kg).  Physical Exam   Vitals reviewed.  Constitutional: She is oriented to person, place, and time. She appears well-developed and well-nourished.   HENT:   Head: Normocephalic and atraumatic.   Eyes: Conjunctivae and EOM are normal.   Neck: Normal range of motion. Neck supple. No thyromegaly present.   Cardiovascular: Normal rate, regular rhythm and normal heart sounds.    Pulmonary/Chest: Effort normal and breath sounds normal. No respiratory distress. She has no wheezes. She has no rales.   Abdominal: Soft. Bowel sounds are normal. She exhibits no distension and no mass. There is no tenderness. There is no rebound and no guarding.   No masses or tenderness.  No hernia palpable.   No hepatosplenomegaly.     Prior scars from xlap, tummy tuck   Genitourinary:   Genitourinary Comments: EXAMS ON EVEN CYCLES  Normal external genitalia/vulva.  Normal urethral meatus, urethra, bladder, anus and perineum.  Normal vagina and vaginal cuff.  Uterus, cervix and adnexa surgically absent.  Normal rectum.  There is no nodularity in the posterior culdesac or rectovaginal septum.    a small healing blister left buttocks   Musculoskeletal: Normal range of motion. She exhibits no edema.      Lymphadenopathy:     She has no cervical adenopathy.   Neurological: She is alert and oriented to person, place, and time.   Skin: Skin is warm and dry.        Red raised lesion left lateral calf, no ulceration, no itching. History of extensive sun exposure.   Psychiatric: She has a normal mood and affect. Her behavior is normal. Judgment and thought content normal.          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     Posterior cul de sac biopsy-metastatic adenocarcinoma.     Omentectomy- metastatic adenocarcinoma     LND-negative     Residual right IP ligament, Anterior cul de sac biopsy, right pelvic side will biopsy, left pelvic side wall, right gutter and left gutter-->benign    04/06/2017:   CT [name]- mild small bowel obstruction with transition point in the left pelvis adjacent to the anterior abdominal wall. Low density in the common femoral veins bilaterally thought likely to represent flow artificat, may consider follow up with lomer extremity Doppler to ensure there is no underlying deep venous thrombosis.    01/29/2018:   CTAP:  Interval development of necrotic retroperitoneal lymphadenopathy with largest node measuring 4.9  4.2 x 4.8 cm. Origin is indeterminate.  Options  would include CT directed biopsy as  well as PET/CT.    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  No other focal areas of FD avid malignancy are found. Specifically, no lymph node activity is found outside of the retroperitoneum and there is no evidence for FDG avid pulmonary, skeletal or hepatic parenchymal metastasis.    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    We discussed recurrent ovarian cancer in detail and agree with the recommendation of Dr. Ishmael Holter for treatment platinum-based chemotherapy with Carboplatin and Paclitaxel.  The patient is interested in dose-dense Paclitaxel and we will proceed in this manner.      Today she presents for C4D1 of carbo/plus weekly taxol. HOLD due to low anc    She is tolerating chemotherapy appropriately well, and has experienced expected but manageable side effects. I have reviewed her labs, Her WBC/ANC 2.02/1046 we will delay the start of cycle 4  for 1 week and change to 28 days cycle to allow for one week off after D15 to allow for counts to recover. There is no evidence of progressive disease or excessive toxicity today, and we will continue treatment without dose modification. We will see her back for her next cycle of treatment, and she knows to call if questions or problems arise. We have ordered nadir and pre-chemo labs and will continue monitoring in between cycles.       Genetics:  previousley discussed her negative genetic testing results.    Skin Lesions: Red raised lesion left lateral calf, no ulceration, no itching. History of extensive sun exposure. Encouraged her to schedule appt with dermatologist for evaluation    Pain  Pain Score: Zero (07/16/2018  2:45 PM)  Pain Loc: ABDOMEN (07/07/2018 10:32 AM)  Pain Frequency 2: Continuous (07/07/2018 10:32 AM)  She reports pain is intermittent, at time of visit she denied pain.  Pain will be reassessed at next visit.               Tery Sanfilippo, CNP  Division of Gynecologic Oncology  [phone]          Medical Decision Making:  The following items were considered in medical decision making:  Review / order clinical lab tests  Review / order radiology tests  Review / order other diagnostic tests/interventions

## 2018-07-16 NOTE — Nursing Note (Signed)
Lab draw from Venous Access Device

## 2018-07-16 NOTE — Progress Notes (Signed)
Pt declined visit, did not feel she needed to be seen

## 2018-07-16 NOTE — Unmapped (Deleted)
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 She is a patient of Dr [name].  ??  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 She declined adjuvant therapy. She used natural methods, went to a raw diet for a year, etc. She developed a small bowel obstruction in 03/2017, resolved on its own, with NED at that time. In 01/2018, she experienced mid abdominal pain, and seen by PCP and ED, with CT of abdomen and pelvis showing interval development of necrotic retroperitoneal lymphadenopathy with largest node measuring 4.9  4.2 x 4.8 cm. Her CA 125 on 02/18/18 was 197.8.  She underwent a ct-guided nodal biopsy on 02/26/18, with pathology confirming recurrent disease.  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 She was seen at [facility] in [place] with Dr. Ishmael Holter. She is undergoing chemotherapy with Carboplatin and Paclitaxel-weekly.    ??      INTERVAL HISTORY:  Yesterday she experienced abdominal pain that was sharp on her LLQ and had significant diarrhea. The pain waxes and wanes, and does not radiate. It is now a 4 but was once an 8/10. She attributes it to eating a variety of fruits and vegetables. She is experiencing intermittent nausea, and this episodes reminds her of her small bowel obstruction incident in 2018. She denies fevers, chills, shortness  of breath, dysuria, and leg pain.      PAST MEDICAL/SURGICAL HX:  Past Medical History:   Diagnosis Date   ??? Bowel obstruction (CMS Dx) 2018   ??? Ovarian cancer (CMS Dx) 2014         Past Surgical History  Past Surgical History:   Procedure Laterality Date   ??? ABDOMINOPLASTY  1999   ??? BLEPHAROPLASTY  2000   ??? DILATION AND CURETTAGE OF UTERUS  1979   ??? ELBOW SURGERY  1990   ??? EYE SURGERY  1994   ??? KNEE ARTHROPLASTY Right    ??? ROTATOR CUFF REPAIR     ??? TRANSUMBILICAL AUGMENTATION MAMMAPLASTY     ??? VAGINAL HYSTERECTOMY  1979           Family History  History reviewed. No pertinent family history.    Social History  Social History     Social History   ??? Marital status: Divorced     Spouse name: N/A   ??? Number of children: N/A   ??? Years of education: N/A     Social History Main Topics   ??? Smoking status: Former Smoker   ??? Smokeless tobacco: Never Used   ??? Alcohol use Yes      Comment: 2-3 drinks a day   ??? Drug use: No   ??? Sexual activity: Not Asked     Other Topics Concern   ??? Caffeine Use No   ??? Occupational Exposure No   ???  Exercise Yes   ??? Seat Belt Yes     Social History Narrative    Mammogram-6 years ago and it was normal. Pt stated she isn't getting them anymore.      Colonoscopy-10 years ago        Pt reports she can lie flat in bed without SOB.    Pt states she can walk a flight of stairs and city block without chest pain and SOB>             CURRENT MEDICATIONS:    Current Outpatient Prescriptions:   ???  dexamethasone (DECADRON) 4 MG tablet, Take 8 mg by mouth daily with food on day 2, 3, and 4 of chemotherapy cycle, Disp: 18 tablet, Rfl: 6  ???  digestive enzymes Cap, Take by mouth., Disp: , Rfl:   ???  ergocalciferol, vitamin D2, (VITAMIN D ORAL), Take 10,000 Units by mouth daily.    , Disp: , Rfl:   ???  ibuprofen (ADVIL,MOTRIN) 200 MG tablet, Take 200-800 mg by mouth every 8 hours as needed for Pain., Disp: , Rfl:   ???  ibuprofen-diphenhydramine HCl 200-25 mg Cap, Take 1 tablet by mouth at bedtime as needed., Disp: ,  Rfl:   ???  Lactobac no.41/Bifidobact no.7 (PROBIOTIC-10 ORAL), Take 1 capsule by mouth daily. [employer] brand, Disp: , Rfl:   ???  lidocaine-prilocaine (EMLA) cream, Apply topically daily as needed. Apply pea-size amount to port site 30-60 min prior to accessing port, Disp: 30 g, Rfl: 1  ???  MAGNESIUM SULFATE TOP, Apply topically daily., Disp: , Rfl:   ???  omeprazole (PRILOSEC) 20 MG capsule, Take 20 mg by mouth daily.    , Disp: , Rfl:   ???  ondansetron (ZOFRAN) 4 MG tablet, Take 1 tablet (4 mg total) by mouth every 8 hours as needed for Nausea. Must wait to take until 5 days after receiving Aloxi, Disp: 20 tablet, Rfl: 2  ???  proCHLORPERazine (COMPAZINE) 10 MG tablet, Take 1 tablet (10 mg total) by mouth every 6 hours as needed (For nausea and vomiting)., Disp: 30 tablet, Rfl: 3  ???  vit b complex w-b 12 (B COMPLEX-VITAMIN B12) tablet, Take 1 tablet by mouth daily., Disp: , Rfl:   ???  vitamin E 100 UNIT capsule, Take 100 Units by mouth daily., Disp: , Rfl:     ALLERGIES  Allergies   Allergen Reactions   ??? Bactrim [Sulfamethoxazole-Trimethoprim] Other (See Comments)     Hallucinations, anxiety   ??? Nsaids (Non-Steroidal Anti-Inflammatory Drug)      Stomach ache   ??? Celecoxib Nausea And Vomiting   ??? Metoclopramide Other (See Comments)     Does not remember         REVIEW OF SYSTEMS:  Complete 10-system review is negative except abdominal pain as above in Interval History.      PHYSICAL EXAM:  Vitals:    07/16/18 1445   BP: 130/77   Pulse: 98   Resp: 18   Temp: 98.3 ??F (36.8 ??C)     General: Alert, oriented, no acute distress.  HEENT: Posterior oropharynx clear, sclera anicteric.  Chest: Clear to auscultation bilaterally.  Port site clean. No work of breathing.  Cardiovascular: Regular rate and rhythm, no murmurs. Well perfused  Abdomen: soft, tenderness in RLQ. Active bowel sounds. No guarding.  Extremities: Grossly normal range of motion. Warm, well perfused. No edema bilaterally.  Skin: No rashes or lesions noted.  GU:  deferred    LABORATORY  AND RADIOLOGIC STUDIES:  KUB 7/31  FINDINGS:  There is distention of the small bowel with multiple air-fluid levels on the upright view. A fluid and gas level is present in the gastric fundus. Gas appears to extend to the level of the rectum.  There are postsurgical changes in the pelvis. Underlying organ shadows are grossly normal.    IMPRESSION:  Nonspecific bowel gas pattern. The presence of multiple air-fluid levels in small bowel raises question of early or partial small bowel obstruction.          Lab Results   Component Value Date    WBC 3.0 (L) 07/07/2018    RBC 2.36 (L) 07/07/2018    HGB 9.4 (L) 07/07/2018    HCT 26.1 (L) 07/07/2018    MCV 110.6 (H) 07/07/2018    MCH 39.7 (H) 07/07/2018    MCHC 35.9 07/07/2018    RDW 18.2 (H) 07/07/2018    PLT 261 07/07/2018   ANC 2,049      Na 129  K 4.3  Cl 94  CO2 29  Bun 14  Cr 0.55  Glucose 147      ASSESSMENT AND PLAN:  Kelsey Sullivan is a 77 y.o. 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

## 2018-07-23 ENCOUNTER — Ambulatory Visit: Admit: 2018-07-23 | Payer: Medicare (Managed Care)

## 2018-07-23 ENCOUNTER — Other Ambulatory Visit: Admit: 2018-07-23 | Payer: Medicare (Managed Care)

## 2018-07-23 DIAGNOSIS — C569 Malignant neoplasm of unspecified ovary: Secondary | ICD-10-CM

## 2018-07-23 DIAGNOSIS — C561 Malignant neoplasm of right ovary: Secondary | ICD-10-CM

## 2018-07-23 LAB — CBC
Hematocrit: 29.6 % — ABNORMAL LOW (ref 35.0–45.0)
Hemoglobin: 10.6 g/dL — ABNORMAL LOW (ref 11.7–15.5)
MCH: 35.8 pg — ABNORMAL HIGH (ref 27.0–33.0)
MCHC: 35.7 g/dL (ref 32.0–36.0)
MCV: 100.3 fL — ABNORMAL HIGH (ref 80.0–100.0)
MPV: 7.8 fL (ref 7.5–11.5)
Platelets: 132 10E3/uL — ABNORMAL LOW (ref 140–400)
RBC: 2.95 10E6/uL — ABNORMAL LOW (ref 3.80–5.10)
RDW: 19.7 % — ABNORMAL HIGH (ref 11.0–15.0)
WBC: 3.2 10E3/uL — ABNORMAL LOW (ref 3.8–10.8)

## 2018-07-23 LAB — DIFFERENTIAL
Basophils Absolute: 42 /uL (ref 0–200)
Basophils Relative: 1.3 % (ref 0.0–1.0)
Eosinophils Absolute: 16 /uL (ref 15–500)
Eosinophils Relative: 0.5 % (ref 0.0–8.0)
Lymphocytes Absolute: 1178 /uL (ref 850–3900)
Lymphocytes Relative: 36.8 % (ref 15.0–45.0)
Monocytes Absolute: 272 /uL (ref 200–950)
Monocytes Relative: 8.5 % (ref 0.0–12.0)
Neutrophils Absolute: 1693 /uL (ref 1500–7800)
Neutrophils Relative: 52.9 % (ref 40.0–80.0)
nRBC: 0 /100 WBC (ref 0–0)

## 2018-07-23 MED ORDER — sodium chloride flush 20 mL
Freq: Every day | INTRAMUSCULAR | Status: AC | PRN
Start: 2018-07-23 — End: 2018-07-23
  Administered 2018-07-23: 14:00:00 20 mL via INTRAVENOUS

## 2018-07-23 MED ORDER — heparin lock flush Syrg 500 Units
100 | Freq: Every day | INTRAVENOUS | Status: AC | PRN
Start: 2018-07-23 — End: 2018-07-23
  Administered 2018-07-23: 14:00:00 500 [IU] via INTRAVENOUS

## 2018-07-23 NOTE — Unmapped (Signed)
Labs drawn from implanted venous access device.

## 2018-07-30 ENCOUNTER — Ambulatory Visit: Admit: 2018-07-30 | Payer: Medicare (Managed Care)

## 2018-07-30 ENCOUNTER — Other Ambulatory Visit: Admit: 2018-07-30 | Payer: Medicare (Managed Care)

## 2018-07-30 DIAGNOSIS — C563 Malignant neoplasm of bilateral ovaries (CMS-HCC): Secondary | ICD-10-CM

## 2018-07-30 DIAGNOSIS — Z5111 Encounter for antineoplastic chemotherapy: Secondary | ICD-10-CM

## 2018-07-30 LAB — DIFFERENTIAL
Basophils Absolute: 20 /uL (ref 0–200)
Basophils Relative: 0.7 % (ref 0.0–1.0)
Eosinophils Absolute: 36 /uL (ref 15–500)
Eosinophils Relative: 1.3 % (ref 0.0–8.0)
Lymphocytes Absolute: 1033 /uL (ref 850–3900)
Lymphocytes Relative: 36.9 % (ref 15.0–45.0)
Monocytes Absolute: 322 /uL (ref 200–950)
Monocytes Relative: 11.5 % (ref 0.0–12.0)
Neutrophils Absolute: 1389 /uL (ref 1500–7800)
Neutrophils Relative: 49.6 % (ref 40.0–80.0)
nRBC: 0 /100 WBC (ref 0–0)

## 2018-07-30 LAB — CBC
Hematocrit: 31 % — ABNORMAL LOW (ref 35.0–45.0)
Hemoglobin: 10.8 g/dL — ABNORMAL LOW (ref 11.7–15.5)
MCH: 35.5 pg — ABNORMAL HIGH (ref 27.0–33.0)
MCHC: 34.9 g/dL (ref 32.0–36.0)
MCV: 101.7 fL — ABNORMAL HIGH (ref 80.0–100.0)
MPV: 7.6 fL (ref 7.5–11.5)
Platelets: 174 10E3/uL (ref 140–400)
RBC: 3.04 10E6/uL — ABNORMAL LOW (ref 3.80–5.10)
RDW: 19.6 % — ABNORMAL HIGH (ref 11.0–15.0)
WBC: 2.8 10E3/uL — ABNORMAL LOW (ref 3.8–10.8)

## 2018-07-30 LAB — COMPREHENSIVE METABOLIC PANEL
ALT: 18 U/L (ref 7–52)
AST: 26 U/L (ref 13–39)
Albumin: 4.3 g/dL (ref 3.5–5.7)
Alkaline Phosphatase: 64 U/L (ref 36–125)
Anion Gap: 7 mmol/L (ref 3–16)
BUN: 14 mg/dL (ref 7–25)
CO2: 28 mmol/L (ref 21–33)
Calcium: 9.1 mg/dL (ref 8.6–10.3)
Chloride: 102 mmol/L (ref 98–110)
Creatinine: 0.63 mg/dL (ref 0.60–1.30)
Glucose: 143 mg/dL — ABNORMAL HIGH (ref 70–100)
Osmolality, Calculated: 287 mosm/kg (ref 278–305)
Potassium: 4.1 mmol/L (ref 3.5–5.3)
Sodium: 137 mmol/L (ref 133–146)
Total Bilirubin: 0.6 mg/dL (ref 0.0–1.5)
Total Protein: 6.6 g/dL (ref 6.4–8.9)
eGFR AA CKD-EPI: >90 See note.
eGFR NONAA CKD-EPI: 87 See note.

## 2018-07-30 LAB — CA 125: CA 125: 11.4 U/mL (ref 5.5–35.0)

## 2018-07-30 LAB — MAGNESIUM: Magnesium: 1.8 mg/dL (ref 1.5–2.5)

## 2018-07-30 MED ORDER — heparin lock flush Syrg 500 Units
100 | Freq: Every day | INTRAVENOUS | Status: AC | PRN
Start: 2018-07-30 — End: 2018-07-30
  Administered 2018-07-30: 18:00:00 500 [IU]

## 2018-07-30 MED ORDER — diphenhydrAMINE (BENADRYL) injection 25 mg
50 | Freq: Every day | INTRAMUSCULAR | Status: AC | PRN
Start: 2018-07-30 — End: 2018-07-30

## 2018-07-30 MED ORDER — dexAMETHasone (DECADRON) tablet 12 mg
4 | Freq: Once | ORAL | Status: AC
Start: 2018-07-30 — End: 2018-07-30
  Administered 2018-07-30: 15:00:00 12 mg via ORAL

## 2018-07-30 MED ORDER — sodium chloride flush 10 mL
Freq: Every day | INTRAMUSCULAR | Status: AC | PRN
Start: 2018-07-30 — End: 2018-07-30

## 2018-07-30 MED ORDER — LORazepam (ATIVAN) tablet 0.5 mg
0.5 | Freq: Four times a day (QID) | ORAL | Status: AC | PRN
Start: 2018-07-30 — End: 2018-07-30

## 2018-07-30 MED ORDER — diphenhydrAMINE (BENADRYL) capsule 25 mg
25 | Freq: Once | ORAL | Status: AC | PRN
Start: 2018-07-30 — End: 2018-07-30
  Administered 2018-07-30: 15:00:00 25 mg via ORAL

## 2018-07-30 MED ORDER — famotidine (PEPCID) tablet 20 mg
20 | Freq: Once | ORAL | Status: AC | PRN
Start: 2018-07-30 — End: 2018-07-30

## 2018-07-30 MED ORDER — CARBOplatin (PARAPLATIN) 420 mg in dextrose 5% in water (D5W) 250 mL chemo infusion
Freq: Once | INTRAVENOUS | Status: AC
Start: 2018-07-30 — End: 2018-07-30
  Administered 2018-07-30: 17:00:00 420 mg via INTRAVENOUS

## 2018-07-30 MED ORDER — diphenhydrAMINE (BENADRYL) injection 25 mg
50 | Freq: Once | INTRAMUSCULAR | Status: AC
Start: 2018-07-30 — End: 2018-07-30

## 2018-07-30 MED ORDER — aprepitant (CINVANTI) IV emulsion 130 mg
7.2 | Freq: Once | INTRAVENOUS | Status: AC
Start: 2018-07-30 — End: 2018-07-30
  Administered 2018-07-30: 15:00:00 130 mg via INTRAVENOUS

## 2018-07-30 MED ORDER — proCHLORPERazine (COMPAZINE) tablet 10 mg
10 | Freq: Four times a day (QID) | ORAL | Status: AC | PRN
Start: 2018-07-30 — End: 2018-07-30

## 2018-07-30 MED ORDER — albuterol (PROVENTIL;VENTOLIN;PROAIR) inhaler 1-2 puff
90 | RESPIRATORY_TRACT | Status: AC | PRN
Start: 2018-07-30 — End: 2018-07-30

## 2018-07-30 MED ORDER — PACLitaxel (TAXOL) 120 mg in sodium chloride 0.9 % 250 mL chemo infusion
Freq: Once | INTRAVENOUS | Status: AC
Start: 2018-07-30 — End: 2018-07-30
  Administered 2018-07-30: 16:00:00 120 mg/m2 via INTRAVENOUS

## 2018-07-30 MED ORDER — palonosetron (ALOXI) injection 0.25 mg
0.25 | Freq: Once | INTRAVENOUS | Status: AC
Start: 2018-07-30 — End: 2018-07-30
  Administered 2018-07-30: 15:00:00 0.25 mg via INTRAVENOUS

## 2018-07-30 MED ORDER — sodium chloride 0.9 % infusion
Freq: Every day | INTRAVENOUS | Status: AC | PRN
Start: 2018-07-30 — End: 2018-07-30
  Administered 2018-07-30: 15:00:00 25 mL/h via INTRAVENOUS

## 2018-07-30 MED ORDER — EPINEPHrine 1 mg/mL injection
1 | Freq: Every day | INTRAMUSCULAR | Status: AC | PRN
Start: 2018-07-30 — End: 2018-07-30

## 2018-07-30 MED ORDER — hydrocortisone sod succ (PF)(SOLU-CORTEF) 100 mg injection
100 | Freq: Every day | INTRAMUSCULAR | Status: AC | PRN
Start: 2018-07-30 — End: 2018-07-30

## 2018-07-30 MED ORDER — famotidine (PF) (PEPCID) injection 20 mg
20 | Freq: Once | INTRAVENOUS | Status: AC
Start: 2018-07-30 — End: 2018-07-30
  Administered 2018-07-30: 15:00:00 20 mg via INTRAVENOUS

## 2018-07-30 MED FILL — CINVANTI 7.2 MG/ML INTRAVENOUS EMULSION: 7.2 7.2 mg/mL | INTRAVENOUS | Qty: 18

## 2018-07-30 MED FILL — PALONOSETRON 0.25 MG/5 ML INTRAVENOUS SOLUTION: 0.25 0.25 mg/5 mL | INTRAVENOUS | Qty: 5

## 2018-07-30 MED FILL — DIPHENHYDRAMINE 25 MG CAPSULE: 25 25 mg | ORAL | Qty: 1

## 2018-07-30 MED FILL — DIPHENHYDRAMINE 50 MG/ML INJECTION SOLUTION: 50 50 mg/mL | INTRAMUSCULAR | Qty: 1

## 2018-07-30 MED FILL — PACLITAXEL 6 MG/ML CONCENTRATE,INTRAVENOUS: 6 6 mg/mL | INTRAVENOUS | Qty: 20

## 2018-07-30 MED FILL — CARBOPLATIN 10 MG/ML INTRAVENOUS SOLUTION: 10 10 mg/mL | INTRAVENOUS | Qty: 42

## 2018-07-30 MED FILL — DEXAMETHASONE 4 MG TABLET: 4 4 MG | ORAL | Qty: 3

## 2018-07-30 MED FILL — FAMOTIDINE (PF) 20 MG/2 ML INTRAVENOUS SOLUTION: 20 20 mg/2 mL | INTRAVENOUS | Qty: 2

## 2018-07-30 NOTE — Unmapped (Signed)
0930 Port accessed per protocol. Blood return noted. Flushed with 10ml of normal saline, occlusive dressing placed over the huber needle. See flow sheet for details. Labs drawn via port.

## 2018-07-30 NOTE — Unmapped (Signed)
History of Present Illness  Kelsey Sullivan is a 77 y.o. female with   Chief Complaint   Patient presents with   ??? 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 She declined adjuvant therapy. She used natural methods, went to a raw diet for a year, etc.  She developed a small bowel obstruction in 03/2017, resolved on its own, with NED at that time.  In 01/2018, she experienced mid abdominal pain, and seen by PCP and ED, with CT of abdomen and pelvis showing interval development of necrotic retroperitoneal lymphadenopathy with largest node measuring 4.9  4.2 x 4.8 cm. Her CA 125 on 02/18/18 was 197.8.  She underwent a ct-guided nodal biopsy on 02/26/18, with pathology confirming recurrent disease.  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 She was seen at [facility] in [place] with Dr. Ishmael Holter. She is undergoing chemotherapy with Carboplatin and Paclitaxel-weekly.      She was recently admitted from 7/31-07/13/18 for a SBO. The abdominal XR showed dilated bowel loops, concerning for obstruction. CT abdomen/pelvis showed dilation of the small bowel with transition point seen in the anterior mid abdomen near the midline.  Overnight at 3am on 8/1 she was found to be febrile to 100.9, and labs showed WBC 0.8 ANC 522. She was put on neutropenic fever precautions.  She was given a dose of IV cefepime. She had one emesis overnight and was given a dose of phenergan. Blood and urine cultures are pending. CXR showed clear lungs. At 6am, her T was 99.6. Her antibiotics were broadened to include metronidazole. She remained afebrile for the remaining days of her admission. Her blood cultures showed no growth to date. Her urine culture grew mixed urogenital flora, which was not concerning.  ??  Her small bowel obstruction was treated conservatively with NPO diet and NG tube, placed 0930 on 8/1. On initial placement, the NG emitted about of bilious gastric contents, and continued to put out several liters over the next few days. After a reduction in NG output and tolerating a clamping trial, her NG tube was removed on 8/5. She was able to advance her diet with nausea/vomiting. Her bowel function had returned.??She was ambulating and voiding without difficulty. During admission she also had low urine output. She received several fluid boluses, and her FeNa was calculated to be 0.7%, which demonstrated a pre-renal etiology, likely a result of low intake prior to admission. Her urine output improved to >50cc/hr. ??    She is seen today for PCE.  Kelsey Sullivan is doing well. Feels much improved since recent hospitalization.  She denies any bloating or fullness.  She reports normal bowel/bladder habits.   She denies any GU concerns. Denies any dysuria, frequency, hematuria, flank pain or fevers.  She denies any vaginal bleeding  or discharge.   She denies neuropathy.   She reports no leg swelling.   Her appetite is good, and reports no nausea or vomiting. Tolerating solid foods.      Patient Active Problem List    Diagnosis   ??? Small bowel obstruction (CMS Dx)   ??? H/O small bowel obstruction   ??? 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 Pt declined adjuvant therapy      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     Posterior cul de sac biopsy-metastatic adenocarcinoma.     Omentectomy- metastatic adenocarcinoma     LND-negative     Residual right IP ligament, Anterior cul de sac biopsy, right pelvic side will biopsy, left pelvic side wall, right gutter and left gutter-->benign    04/06/2017:   CT [name]- mild small bowel obstruction with transition point in the left pelvis adjacent to the anterior abdominal wall. Low density in the common femoral veins bilaterally thought likely to represent flow artificat, may consider follow up with lomer extremity Doppler to ensure there is no underlying deep venous thrombosis.    01/29/2018:   CTAP:  Interval development of necrotic retroperitoneal lymphadenopathy with largest node measuring 4.9  4.2 x 4.8 cm. Origin is indeterminate.  Options would include CT directed biopsy as well as PET/CT.    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  No other focal areas of FD avid malignancy are found. Specifically, no lymph node activity is found outside of the  retroperitoneum and there is no evidence for FDG avid pulmonary, skeletal or hepatic parenchymal metastasis.    02/2018:   Patient saw Dr. Ishmael Holter who recommended Carbo/Taxol with an AUC of 6.  Dr. Maren Reamer office submitted [facility] testing. Patient would like Genetic Testing    04/12/2018:   IR port placed    04/16/2018:   C1 weekly taxol 80 mg/m2 D1,8,15, Carboplatin AUC 6 D1 q 21 days. PCEs on thurs, txt on fridays. EXAMS ON EVEN CYCLES     CA 125: 523.4     Genetic testing: Neg for CS mutation, no VUS, lifetime remaining breast cancer risk -3.4%    05/07/2018:   C2 weekly taxol 80 mg/m2 D1,8,15, Carboplatin AUC 6 D1 q 21 days. PCEs on thurs, txt on fridays. EXAMS ON EVEN CYCLES     CA 125: 162.2    05/20/2018:   Early PCE due to scheduling, due 6/21  05/28/2018  Delay C3 due to low ANC     CA 125: 25    06/04/2018  C3 weekly taxol 80 mg/m2 D1,8,15, Carboplatin AUC 6 D1 q 21 days. EXAMS ON EVEN CYCLES.      CA 125    06/25/2018:   HOLD chemo due to low anc (1047)- defer to next week, and change to q 28 days cycle to build in an off week to allow for counts to recover     CA 125: 10.6    07/02/2018:   C4 weekly taxol 80 mg/m2 D1,8,15, Carboplatin AUC 6 D1 q 28 days. ANC still lowish 1480s- start neulasta Day 16      EXAMS ON EVEN CYCLES.  Review her genetics testing results      CA 125 7.2    07/07/2018:   [facility] admission: SBO, neutropenic fever     CTAP:Small bowel obstruction with transition point in the anterior mid abdomen near the midline. Small amount of free fluid in the abdomen, which is nonspecific. 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     Peritoneum: Small amount of free fluid in the abdomen. A para-aortic density with central low attenuation measures 2.5 x 2 cm and is significantly decreased in size from prior.      Disposition: chemo, imaging after 3 cucles, CA 125, possible parp maintenance after, genetic testing pending,  [facility] testing results from [facility] in [place]  Current disease status: Neg for CS mutation, no VUS, lifetime remaining breast cancer risk -3.4%  Genetics: negative for mutations, breast cancer lifetime risk 3.4%  Survivorship plan: pending completion of treatment              Past Medical History  She  has a past medical history of Bowel obstruction (CMS Dx) (2018) and Ovarian cancer (CMS Dx) (2014).  Past Medical History:   Diagnosis Date   ??? Bowel obstruction (CMS Dx) 2018   ??? Ovarian cancer (CMS Dx) 2014       Past Surgical History  Past Surgical History:   Procedure Laterality Date   ??? ABDOMINOPLASTY  1999   ??? BLEPHAROPLASTY  2000   ??? DILATION AND CURETTAGE OF UTERUS  1979   ??? ELBOW SURGERY  1990   ??? EYE SURGERY  1994   ??? KNEE ARTHROPLASTY Right    ??? ROTATOR CUFF REPAIR     ??? TRANSUMBILICAL AUGMENTATION MAMMAPLASTY     ??? VAGINAL HYSTERECTOMY  1979       Family History  No family history on file.    Family cancer history for ovarian, uterine and colon cancer is negative other than above.       Social History  Social History     Social History   ??? Marital status: Divorced     Spouse name: N/A   ??? Number of children: N/A   ??? Years of education: N/A     Social History Main Topics   ??? Smoking status: Former Smoker   ??? Smokeless tobacco: Never Used   ??? Alcohol use Yes      Comment: 2-3 drinks a day   ??? Drug use: No   ??? Sexual activity: Not Asked     Other Topics Concern   ??? Caffeine Use No   ??? Occupational Exposure No   ??? Exercise Yes   ??? Seat Belt Yes     Social History Narrative    Mammogram-6  years ago and it was normal. Pt stated she isn't getting them anymore.      Colonoscopy-10 years ago        Pt reports she can lie flat in bed without SOB.    Pt states she can walk a flight of stairs and city block without chest pain and SOB>         Past OB/GYN History  G3P2  She reports menarche at age 83 and menopause at age 64-surgical.  She denies a history of STIs.  She denies a history of abnormal cervical cytology and  reports her last cytologic examination she is unsure of. She has taken HRT.     Health maintenance:  Mammogram: Date 6 years ago Results normal  Colonoscopy: Date 10 years ago Results normal    Allergies  She is allergic to bactrim [sulfamethoxazole-trimethoprim]; nsaids (non-steroidal anti-inflammatory drug); celecoxib; and metoclopramide.    BMI  Body mass index is 20.43 kg/m??.    Disease Status: Systemic recurrence of disease (04/01/2018 12:43 PM)          Histories  She has a past medical history of Bowel obstruction (CMS Dx) (2018) and Ovarian cancer (CMS Dx) (2014).    She has a past surgical history that includes Dilation and curettage of uterus (1979); Vaginal hysterectomy (1979); Elbow surgery (1990); Eye surgery (1994); Abdominoplasty (1999); Blepharoplasty (2000); Transumbilical augmentation mammaplasty; Rotator cuff repair; and Knee Arthroplasty (Right).    Her family history is not on file.    She reports that she has quit smoking. She has never used smokeless tobacco. She reports that she drinks alcohol. She reports that she does not use drugs.    Allergies  Bactrim [sulfamethoxazole-trimethoprim]; Nsaids (non-steroidal anti-inflammatory drug); Celecoxib; and Metoclopramide    Medications  Outpatient Encounter Prescriptions as of 07/30/2018   Medication Sig Dispense Refill   ??? dexamethasone (DECADRON) 4 MG tablet Take 8 mg by mouth daily with food on day 2, 3, and 4 of chemotherapy cycle 18 tablet 6   ??? digestive enzymes Cap Take by mouth.     ??? ergocalciferol, vitamin D2, (VITAMIN D ORAL) Take 10,000 Units by mouth daily.            ??? Lactobac no.41/Bifidobact no.7 (PROBIOTIC-10 ORAL) Take 1 capsule by mouth daily. [employer] brand     ??? MAGNESIUM SULFATE TOP Apply topically daily.     ??? vit b complex w-b 12 (B COMPLEX-VITAMIN B12) tablet Take 1 tablet by mouth daily.     ??? ibuprofen (ADVIL,MOTRIN) 200 MG tablet Take 200-800 mg by mouth every 8 hours as needed for Pain.     ???  ibuprofen-diphenhydramine HCl 200-25 mg Cap Take 1 tablet by mouth at bedtime as needed.     ??? lidocaine-prilocaine (EMLA) cream Apply topically daily as needed. Apply pea-size amount to port site 30-60 min prior to accessing port 30 g 1   ??? omeprazole (PRILOSEC) 20 MG capsule Take 20 mg by mouth daily.            ??? ondansetron (ZOFRAN) 4 MG tablet Take 1 tablet (4 mg total) by mouth every 8 hours as needed for Nausea. Must wait to take until 5 days after receiving Aloxi 20 tablet 2   ??? proCHLORPERazine (COMPAZINE) 10 MG tablet Take 1 tablet (10 mg total) by mouth every 6 hours as needed (For nausea and vomiting). 30 tablet 3   ??? vitamin E 100 UNIT capsule Take 100 Units by mouth daily.  No facility-administered encounter medications on file as of 07/30/2018.         Review of Systems   Constitutional: Negative for activity change, appetite change, chills, fatigue, fever, weight gain and weight loss.   HENT: Negative for congestion, mouth sores, rhinorrhea, sinus pressure, sore throat and trouble swallowing.    Eyes: Negative for photophobia, redness, itching and visual disturbance.   Respiratory: Negative for apnea, cough, chest tightness and shortness of breath.    Cardiovascular: Negative for chest pain, palpitations and leg swelling.   Gastrointestinal: Negative for abdominal distention, abdominal pain, anal bleeding, bloating, blood in stool, constipation, diarrhea, nausea and vomiting.   Genitourinary: Negative for decreased urine volume, difficulty urinating, dysuria, frequency, genital sores, hematuria, pelvic pain, urgency, vaginal bleeding, vaginal discharge and vaginal pain.   Musculoskeletal: Negative for arthralgias, back pain, joint swelling and neck pain.   Skin: Negative for color change, pallor and wound.   Neurological: Negative for dizziness, seizures, weakness, numbness and headaches.   Hematological: Negative for adenopathy. Does not bruise/bleed easily.   Psychiatric/Behavioral: Negative  for agitation, confusion and depression. The patient is nervous/anxious.        Vitals  Blood pressure 100/65, pulse 85, temperature 97.2 ??F (36.2 ??C), temperature source Temporal, resp. rate 16, height 5' 4 (1.626 m), weight 119 lb (54 kg), SpO2 99 %.    Physical Exam   Vitals reviewed.  Constitutional: She is oriented to person, place, and time. She appears well-developed and well-nourished.   HENT:   Head: Normocephalic and atraumatic.   Eyes: Conjunctivae and EOM are normal.   Neck: Normal range of motion. Neck supple. No thyromegaly present.   Cardiovascular: Normal rate, regular rhythm and normal heart sounds.    Pulmonary/Chest: Effort normal and breath sounds normal. No respiratory distress. She has no wheezes. She has no rales.   Abdominal: Soft. Bowel sounds are normal. She exhibits no distension and no mass. There is no tenderness. There is no rebound and no guarding.   No masses or tenderness.  No hernia palpable.   No hepatosplenomegaly.     Prior scars from xlap, tummy tuck   Genitourinary:   Genitourinary Comments: EXAMS ON EVEN CYCLES  Deferred today   Musculoskeletal: Normal range of motion. She exhibits no edema.   Lymphadenopathy:     She has no cervical adenopathy.   Neurological: She is alert and oriented to person, place, and time.   Skin: Skin is warm and dry.        Red raised lesion left lateral calf, no ulceration, no itching. History of extensive sun exposure.   Psychiatric: She has a normal mood and affect. Her behavior is normal. Judgment and thought content normal.          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     Posterior cul de sac biopsy-metastatic adenocarcinoma.     Omentectomy- metastatic adenocarcinoma     LND-negative     Residual right IP ligament, Anterior cul de sac biopsy, right pelvic side will biopsy, left pelvic side wall, right gutter and left gutter-->benign    04/06/2017:   CT [name]- mild small bowel obstruction with transition point in the left pelvis adjacent to the anterior abdominal wall. Low density in the common femoral veins bilaterally thought likely to represent flow artificat, may consider follow up with lomer extremity Doppler to ensure there is no underlying deep venous thrombosis.    01/29/2018:   CTAP:  Interval development of necrotic retroperitoneal lymphadenopathy with largest node measuring 4.9  4.2 x 4.8 cm. Origin is indeterminate.  Options would include CT directed biopsy as well as PET/CT.    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  No other focal areas of FD avid malignancy are found. Specifically, no lymph node activity is found outside of the retroperitoneum and there is no evidence for FDG avid pulmonary, skeletal or hepatic parenchymal metastasis.    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    We discussed recurrent ovarian cancer in detail and agree with the recommendation of Dr. Ishmael Holter for treatment platinum-based chemotherapy with Carboplatin and Paclitaxel.  The patient is interested in dose-dense Paclitaxel and we will proceed in this manner.      Today she presents for C5D1 of carbo/plus weekly taxol. She is tolerating chemotherapy appropriately well, and has experienced expected but manageable side effects. I have reviewed her labs, Her WBC/ANC 1349 and will proceed with treatment, with plan for change to 28 days cycle to allow for one week off after D15 to allow for counts to recover as well as Neulasta on D15. We will continue treatment without dose modification. We will see her back for her next cycle of treatment, and she knows to call if questions or problems arise. We have ordered nadir and pre-chemo labs and will continue monitoring in between cycles.     Genetics:  previously discussed her negative genetic testing results.    Skin  Lesions: Red raised lesion left lateral calf, no ulceration, no itching. History of extensive sun exposure. Encouraged her to schedule appt with dermatologist for evaluation    Pain  Pain Score: Zero (07/30/2018  9:20 AM)  She denies presence of pain.  Pain will be reassessed at next visit.    Complexity: Moderate    Face -to-face time  spent with this patient was 20 minutes and greater than 50% of that time was spent in counseling and coordination regarding the above.    Hedda Slade, CNP  Division of Gynecologic Oncology  [phone]

## 2018-07-30 NOTE — Unmapped (Signed)
Managing Low Blood Counts During Cancer Treatment  Cancer treatments such as chemotherapy and radiation can sometimes cause a drop in the supply of blood cells in the body, including red blood cells, white blood cells, and platelets. These blood cells are produced in the body and are released into the blood to perform specific functions:  ?? Red blood cells carry gases such as oxygen and carbon dioxide to and from your lungs.  ?? White blood cells help protect you from infection.  ?? Platelets help your body to form blood clots to prevent and control bleeding.  When cancer treatments cause a drop in blood cell counts, your body may not have enough cells to keep up its normal functions. Symptoms or problems that may result will vary depending on which type of blood cells the treatment is affecting. If your blood counts are low, you can take steps to help manage any problems.  How can low blood counts affect me?  Low blood counts have various effects depending on the type of blood cells involved:  ?? If you have a low number of red blood cells, you have a condition called anemia. This can cause symptoms such as:  ?? Feeling tired and weak.  ?? Feeling light-headed.  ?? Being short of breath.  ?? If you have a low number of white blood cells, you may be at higher risk for infections.  ?? If you have a low number of platelets, you may bleed more easily, or your body may have trouble stopping any bleeding. You may also have more bruising.  How to manage symptoms or prevent problems from a low blood count  If you have a low blood count, you can take steps to manage symptoms or prevent problems that may develop. The steps to take will depend on which type of blood cell is low.  Low red blood cells  Take these steps to help manage the symptoms of anemia:  ?? Go for a walk or do some light exercise each day.  ?? Take short naps during the day.  ?? Eat foods that contain a lot of iron and protein. These include leafy vegetables, meat and  fish, beans, sweet potatoes, and dried fruit such as prunes, raisins, and apricots.  ?? Ask for help with errands and with work that needs to be done around the house. It is important to save your energy.  ?? Take vitamins or supplements--such as iron, vitamin B12, or folic acid--as told by your health care provider.  ?? Practice relaxation techniques, such as yoga or meditation.  Low white blood cells  Take these steps to help prevent infections:  ?? Wash your hands often with warm, soapy water.  ?? Avoid crowds of people and any person who has the flu or a fever.  ?? Take care when cleaning yourself after using the bathroom. Tell your health care provider if you have any rectal sores or bleeding.  ?? Avoid dental work. Check your mouth each day for sores or signs of infection.  ?? Do not share utensils.  ?? Avoid contact with pet waste. Wash your hands after handling pets.  ?? If you get a scrape or cut, clean it thoroughly right away.  ?? Avoid fresh plants or dried flowers.  ?? Do not swim or wade in lakes, ponds, rivers, water parks, or hot tubs.  ?? Follow food safety guidelines. Cook meat thoroughly and wash all raw fruits and vegetables.  ?? You may be instructed to   wear a mask when around others to protect yourself.  Low platelets  Take these steps to help prevent or control bleeding and bruising:  ?? Use an electric razor for shaving instead of a blade.  ?? Use a soft toothbrush and be careful during oral care. Talk with your cancer care team about whether you should avoid flossing. If your mouth is bleeding, rinse it with ice water.  ?? Avoid activities that could cause injury, such as contact sports.  ?? Talk with your health care provider about using laxatives or stool softeners to avoid constipation.  ?? Do not use medicines such as ibuprofen, aspirin, or naproxen unless your health care provider tells you to.  ?? Limit alcohol use.  ?? Monitor any bleeding closely. If you start bleeding, hold pressure on the area for 5  minutes to stop the bleeding. Bleeding that does not stop is considered an emergency.  What treatments can help increase a low blood count?  If needed, your health care provider may recommend treatment for a low blood count. Treatment will depend on the type of blood cell that is low and the severity of your condition. Treatment options may include:  ?? Taking medicines to help stimulate the growth of blood cells. This is an option for treating a low red blood cell count. Your health care provider may also recommend that you take iron, folic acid, or vitamin B12 supplements.  ?? Making dietary changes. Including more iron and protein in your diet can help stimulate the growth of red blood cells.  ?? Adjusting your current medicines to help raise blood counts.  ?? Making changes to your treatment plan.  ?? Having a blood transfusion. This may be done if your blood count is very low.  Contact a health care provider if:  ?? You feel extremely tired and weak.  ?? You have more bruising or bleeding.  ?? You feel ill or you develop a cough.  ?? You have swelling or redness.  ?? You have mouth sores or a sore throat.  ?? You have painful urination or you have blood in your urine or stool.  ?? You are thinking of taking any new supplements or vitamins or making dietary changes.  Get help right away if:  ?? You are short of breath, have chest pain, or feel dizzy.  ?? You have a fever or chills.  ?? You have abdominal pain or diarrhea.  ?? You have bleeding that will not stop.  Summary  ?? Cancer treatments such as chemotherapy and radiation can sometimes cause a drop in the supply of blood cells in the body, including red blood cells, white blood cells, and platelets.  ?? If you have a low blood count, you can take steps to manage symptoms or prevent problems that may develop.  ?? Depending on which type of blood cell is low, you may need to take steps to prevent infection, prevent bleeding, or manage symptoms that may develop.  ?? If needed,  your health care provider may recommend treatment for a low blood count.  This information is not intended to replace advice given to you by your health care provider. Make sure you discuss any questions you have with your health care provider.  Document Released: 07/19/2016 Document Revised: 07/19/2016 Document Reviewed: 07/19/2016  Elsevier Interactive Patient Education ?? 2018 [employer]

## 2018-07-30 NOTE — Unmapped (Signed)
C5, D1 Taxol, Carboplatin infusion. Pt arrived ambulatory to infusion area for scheduled tx. Pt arrived with port having been accessed in port room.  Patient declines intravenous benadryl and will only take oral benadryl, pharmacy notified.  Patient slept most of treatment, daughter present.  She will return in 1 week for next part of the weekly treatment and received her AVS form after scheduling that appt.  Left in stable condition.

## 2018-08-06 ENCOUNTER — Ambulatory Visit: Admit: 2018-08-06 | Payer: Medicare (Managed Care)

## 2018-08-06 ENCOUNTER — Other Ambulatory Visit: Admit: 2018-08-06 | Payer: Medicare (Managed Care)

## 2018-08-06 DIAGNOSIS — C563 Malignant neoplasm of bilateral ovaries (CMS-HCC): Secondary | ICD-10-CM

## 2018-08-06 LAB — DIFFERENTIAL
Bands Absolute: 86 /uL (ref 0–750)
Bands Relative: 2 % (ref 0.0–9.0)
Basophils Absolute: 43 /uL (ref 0–200)
Basophils Relative: 1 % (ref 0.0–1.0)
Eosinophils Absolute: 86 /uL (ref 15–500)
Eosinophils Relative: 2 % (ref 0.0–8.0)
Lymphocytes Absolute: 1333 /uL (ref 850–3900)
Lymphocytes Relative: 31 % (ref 15.0–45.0)
Monocytes Absolute: 215 /uL (ref 200–950)
Monocytes Relative: 5 % (ref 0.0–12.0)
Neutrophils Absolute: 2537 /uL (ref 1500–7800)
Neutrophils Relative: 59 % (ref 40.0–80.0)
PLT Morphology: NORMAL

## 2018-08-06 LAB — CBC
Hematocrit: 29.8 % — ABNORMAL LOW (ref 35.0–45.0)
Hemoglobin: 10.7 g/dL — ABNORMAL LOW (ref 11.7–15.5)
MCH: 36.3 pg — ABNORMAL HIGH (ref 27.0–33.0)
MCHC: 36 g/dL (ref 32.0–36.0)
MCV: 101 fL — ABNORMAL HIGH (ref 80.0–100.0)
MPV: 7.4 fL — ABNORMAL LOW (ref 7.5–11.5)
Platelet Estimate: ADEQUATE
Platelets: 233 10*3/uL (ref 140–400)
RBC: 2.95 10*6/uL — ABNORMAL LOW (ref 3.80–5.10)
RDW: 19 % — ABNORMAL HIGH (ref 11.0–15.0)
WBC: 4.3 10*3/uL (ref 3.8–10.8)

## 2018-08-06 LAB — COMPREHENSIVE METABOLIC PANEL
ALT: 22 U/L (ref 7–52)
AST: 29 U/L (ref 13–39)
Albumin: 4.2 g/dL (ref 3.5–5.7)
Alkaline Phosphatase: 61 U/L (ref 36–125)
Anion Gap: 7 mmol/L (ref 3–16)
BUN: 17 mg/dL (ref 7–25)
CO2: 28 mmol/L (ref 21–33)
Calcium: 9.1 mg/dL (ref 8.6–10.3)
Chloride: 98 mmol/L (ref 98–110)
Creatinine: 0.7 mg/dL (ref 0.60–1.30)
Glucose: 125 mg/dL — ABNORMAL HIGH (ref 70–100)
Osmolality, Calculated: 279 mosm/kg (ref 278–305)
Potassium: 4 mmol/L (ref 3.5–5.3)
Sodium: 133 mmol/L (ref 133–146)
Total Bilirubin: 0.5 mg/dL (ref 0.0–1.5)
Total Protein: 6.3 g/dL — ABNORMAL LOW (ref 6.4–8.9)
eGFR AA CKD-EPI: >90 See note.
eGFR NONAA CKD-EPI: 84 See note.

## 2018-08-06 LAB — MAGNESIUM: Magnesium: 1.1 mg/dL — ABNORMAL LOW (ref 1.5–2.5)

## 2018-08-06 MED ORDER — magnesium sulfate in D5W 100 mL 1 gram/100 mL IVPB 1 g
1 | Freq: Once | INTRAVENOUS | Status: AC
Start: 2018-08-06 — End: 2018-08-06

## 2018-08-06 MED ORDER — famotidine (PF) (PEPCID) injection 20 mg
20 | Freq: Once | INTRAVENOUS | Status: AC
Start: 2018-08-06 — End: 2018-08-06
  Administered 2018-08-06: 14:00:00 20 mg via INTRAVENOUS

## 2018-08-06 MED ORDER — sodium chloride 0.9 % infusion
Freq: Every day | INTRAVENOUS | Status: AC | PRN
Start: 2018-08-06 — End: 2018-08-06

## 2018-08-06 MED ORDER — dexAMETHasone (DECADRON) tablet 4 mg
4 | Freq: Once | ORAL | Status: AC
Start: 2018-08-06 — End: 2018-08-06
  Administered 2018-08-06: 14:00:00 4 mg via ORAL

## 2018-08-06 MED ORDER — PACLitaxel (TAXOL) 120 mg in sodium chloride 0.9 % 250 mL chemo infusion
6 | Freq: Once | INTRAVENOUS | Status: AC
Start: 2018-08-06 — End: 2018-08-06
  Administered 2018-08-06: 15:00:00 120 mg/m2 via INTRAVENOUS

## 2018-08-06 MED ORDER — diphenhydrAMINE (BENADRYL) injection 25 mg
50 | Freq: Once | INTRAMUSCULAR | Status: AC
Start: 2018-08-06 — End: 2018-08-06

## 2018-08-06 MED ORDER — diphenhydrAMINE (BENADRYL) capsule 25 mg
25 | Freq: Once | ORAL | Status: AC | PRN
Start: 2018-08-06 — End: 2018-08-06
  Administered 2018-08-06: 14:00:00 25 mg via ORAL

## 2018-08-06 MED ORDER — magnesium sulfate in D5W 100 mL 1 gram/100 mL IVPB 1 g
1 | Freq: Once | INTRAVENOUS | Status: AC
Start: 2018-08-06 — End: 2018-08-06
  Administered 2018-08-06: 20:00:00 1 g via INTRAVENOUS

## 2018-08-06 MED ORDER — diphenhydrAMINE (BENADRYL) injection 25 mg
50 | Freq: Every day | INTRAMUSCULAR | Status: AC | PRN
Start: 2018-08-06 — End: 2018-08-06

## 2018-08-06 MED ORDER — famotidine (PEPCID) tablet 20 mg
20 | Freq: Once | ORAL | Status: AC | PRN
Start: 2018-08-06 — End: 2018-08-06

## 2018-08-06 MED ORDER — sodium chloride flush 10 mL
Freq: Every day | INTRAMUSCULAR | Status: AC | PRN
Start: 2018-08-06 — End: 2018-08-06

## 2018-08-06 MED ORDER — EPINEPHrine 1 mg/mL injection
1 | Freq: Every day | INTRAMUSCULAR | Status: AC | PRN
Start: 2018-08-06 — End: 2018-08-06

## 2018-08-06 MED ORDER — magnesium sulfate in sterile water 100 mL IVPB 4 g
4 | Freq: Once | INTRAVENOUS | Status: AC
Start: 2018-08-06 — End: 2018-08-06
  Administered 2018-08-06: 15:00:00 4 g via INTRAVENOUS

## 2018-08-06 MED ORDER — hydrocortisone sod succ (PF)(SOLU-CORTEF) 100 mg injection
100 | Freq: Every day | INTRAMUSCULAR | Status: AC | PRN
Start: 2018-08-06 — End: 2018-08-06

## 2018-08-06 MED ORDER — heparin lock flush Syrg 500 Units
100 | Freq: Every day | INTRAVENOUS | Status: AC | PRN
Start: 2018-08-06 — End: 2018-08-06
  Administered 2018-08-06: 21:00:00 500 [IU]

## 2018-08-06 MED ORDER — albuterol (PROVENTIL;VENTOLIN;PROAIR) inhaler 1-2 puff
90 | RESPIRATORY_TRACT | Status: AC | PRN
Start: 2018-08-06 — End: 2018-08-06

## 2018-08-06 MED ORDER — LORazepam (ATIVAN) tablet 0.5 mg
0.5 | Freq: Four times a day (QID) | ORAL | Status: AC | PRN
Start: 2018-08-06 — End: 2018-08-06

## 2018-08-06 MED FILL — DEXAMETHASONE 4 MG TABLET: 4 4 MG | ORAL | Qty: 1

## 2018-08-06 MED FILL — PACLITAXEL 6 MG/ML CONCENTRATE,INTRAVENOUS: 6 6 mg/mL | INTRAVENOUS | Qty: 20

## 2018-08-06 MED FILL — MAGNESIUM SULFATE 1 GRAM/100 ML IN DEXTROSE 5 % INTRAVENOUS PIGGYBACK: 1 1 gram/100 mL | INTRAVENOUS | Qty: 100

## 2018-08-06 MED FILL — MAGNESIUM SULFATE 4 GRAM/100 ML (4 %) IN WATER INTRAVENOUS PIGGYBACK: 4 4 gram/100 mL (4 %) | INTRAVENOUS | Qty: 100

## 2018-08-06 MED FILL — DIPHENHYDRAMINE 50 MG/ML INJECTION SOLUTION: 50 50 mg/mL | INTRAMUSCULAR | Qty: 1

## 2018-08-06 MED FILL — FAMOTIDINE (PF) 20 MG/2 ML INTRAVENOUS SOLUTION: 20 20 mg/2 mL | INTRAVENOUS | Qty: 2

## 2018-08-06 MED FILL — DIPHENHYDRAMINE 25 MG CAPSULE: 25 25 mg | ORAL | Qty: 1

## 2018-08-06 NOTE — Nursing Note (Addendum)
Mg 1.1 = 6 gm magnesium replacement.  C5, D8 Taxol infusion with no signs of distress, daughter present throughout infusion and provided lunch.  She declines iv benadry, only taking oral.   Patient napped during part of the infusion and was seated in front of the nurse's station the entire treatment.  Left in stable condition.

## 2018-08-06 NOTE — Unmapped (Signed)
Lab draw from Venous Access Device

## 2018-08-06 NOTE — Unmapped (Signed)
Hypomagnesemia  Hypomagnesemia is a condition in which the level of magnesium in the blood is low. Magnesium is a mineral that is found in many foods. It is used in many different processes in the body. Hypomagnesemia can affect every organ in the body. It can cause life-threatening problems.  What are the causes?  Causes of hypomagnesemia include:  ?? Not getting enough magnesium in your diet.  ?? Malnutrition.  ?? Problems with absorbing magnesium from the intestines.  ?? Dehydration.  ?? Alcohol abuse.  ?? Vomiting.  ?? Severe diarrhea.  ?? Some medicines, including medicines that make you urinate more.  ?? Certain diseases, such as kidney disease, diabetes, and overactive thyroid.  What are the signs or symptoms?  ?? Involuntary shaking or trembling of a body part (tremor).  ?? Confusion.  ?? Muscle weakness.  ?? Sensitivity to light, sound, and touch.  ?? Psychiatric issues, such as depression, irritability, or psychosis.  ?? Sudden tightening of muscles (muscle spasms).  ?? Tingling in the arms and legs.  ?? A feeling of fluttering of the heart.  These symptoms are more severe if magnesium levels drop suddenly.  How is this diagnosed?  To make a diagnosis, your health care provider will do a physical exam and order blood and urine tests.  How is this treated?  Treatment will depend on the cause and the severity of your condition. It may involve:  ?? A magnesium supplement. This can be taken in pill form. It can also be given through an IV tube. This is usually done if the condition is severe.  ?? Changes to your diet. You may be directed to eat foods that have a lot of magnesium, such as green leafy vegetables, peas, beans, and nuts.  ?? Eliminating alcohol from your diet.  Follow these instructions at home:  ?? Include foods with magnesium in your diet. Foods that are rich in magnesium include green vegetables, beans, nuts and seeds, and whole grains.  ?? Take medicines only as directed by your health care provider.  ?? Take  magnesium supplements if your health care provider instructs you to do that. Take them as directed.  ?? Have your magnesium levels monitored as directed by your health care provider.  ?? When you are active, drink fluids that contain electrolytes.  ?? Keep all follow-up visits as directed by your health care provider. This is important.  Contact a health care provider if:  ?? You get worse instead of better.  ?? Your symptoms return.  Get help right away if:  ?? Your symptoms are severe.  This information is not intended to replace advice given to you by your health care provider. Make sure you discuss any questions you have with your health care provider.  Document Released: 08/20/2005 Document Revised: 05/01/2016 Document Reviewed: 07/10/2014  Elsevier Interactive Patient Education ?? 2018 [employer]

## 2018-08-13 ENCOUNTER — Ambulatory Visit: Admit: 2018-08-13 | Payer: Medicare (Managed Care)

## 2018-08-13 ENCOUNTER — Other Ambulatory Visit: Admit: 2018-08-13 | Payer: Medicare (Managed Care)

## 2018-08-13 DIAGNOSIS — C563 Malignant neoplasm of bilateral ovaries (CMS-HCC): Secondary | ICD-10-CM

## 2018-08-13 DIAGNOSIS — Z5111 Encounter for antineoplastic chemotherapy: Secondary | ICD-10-CM

## 2018-08-13 LAB — DIFFERENTIAL
Basophils Absolute: 22 /uL (ref 0–200)
Basophils Relative: 0.8 % (ref 0.0–1.0)
Eosinophils Absolute: 14 /uL (ref 15–500)
Eosinophils Relative: 0.5 % (ref 0.0–8.0)
Lymphocytes Absolute: 1102 /uL (ref 850–3900)
Lymphocytes Relative: 40.8 % (ref 15.0–45.0)
Monocytes Absolute: 211 /uL (ref 200–950)
Monocytes Relative: 7.8 % (ref 0.0–12.0)
Neutrophils Absolute: 1353 /uL (ref 1500–7800)
Neutrophils Relative: 50.1 % (ref 40.0–80.0)
nRBC: 0 /100 WBC (ref 0–0)

## 2018-08-13 LAB — CBC
Hematocrit: 26.6 % (ref 35.0–45.0)
Hemoglobin: 9.5 g/dL (ref 11.7–15.5)
MCH: 36.4 pg (ref 27.0–33.0)
MCHC: 35.5 g/dL (ref 32.0–36.0)
MCV: 102.3 fL (ref 80.0–100.0)
MPV: 7.7 fL (ref 7.5–11.5)
Platelets: 161 10*3/uL (ref 140–400)
RBC: 2.6 10*6/uL (ref 3.80–5.10)
RDW: 18.9 % (ref 11.0–15.0)
WBC: 2.7 10*3/uL (ref 3.8–10.8)

## 2018-08-13 LAB — COMPREHENSIVE METABOLIC PANEL
ALT: 18 U/L (ref 7–52)
AST: 22 U/L (ref 13–39)
Albumin: 4.2 g/dL (ref 3.5–5.7)
Alkaline Phosphatase: 55 U/L (ref 36–125)
Anion Gap: 8 mmol/L (ref 3–16)
BUN: 17 mg/dL (ref 7–25)
CO2: 26 mmol/L (ref 21–33)
Calcium: 9.1 mg/dL (ref 8.6–10.3)
Chloride: 102 mmol/L (ref 98–110)
Creatinine: 0.62 mg/dL (ref 0.60–1.30)
Glucose: 114 mg/dL (ref 70–100)
Osmolality, Calculated: 284 mOsm/kg (ref 278–305)
Potassium: 4 mmol/L (ref 3.5–5.3)
Sodium: 136 mmol/L (ref 133–146)
Total Bilirubin: 0.5 mg/dL (ref 0.0–1.5)
Total Protein: 6.4 g/dL (ref 6.4–8.9)
eGFR AA CKD-EPI: >90 See note.
eGFR NONAA CKD-EPI: 87 See note.

## 2018-08-13 MED ORDER — hydrocortisone sod succ (PF)(SOLU-CORTEF) 100 mg injection
100 | Freq: Every day | INTRAMUSCULAR | Status: AC | PRN
Start: 2018-08-13 — End: 2018-08-13

## 2018-08-13 MED ORDER — albuterol (PROVENTIL;VENTOLIN;PROAIR) inhaler 1-2 puff
90 | RESPIRATORY_TRACT | Status: AC | PRN
Start: 2018-08-13 — End: 2018-08-13

## 2018-08-13 MED ORDER — famotidine (PF) (PEPCID) injection 20 mg
20 | Freq: Once | INTRAVENOUS | Status: AC
Start: 2018-08-13 — End: 2018-08-13

## 2018-08-13 MED ORDER — sodium chloride 0.9 % infusion
Freq: Every day | INTRAVENOUS | Status: AC | PRN
Start: 2018-08-13 — End: 2018-08-13

## 2018-08-13 MED ORDER — diphenhydrAMINE (BENADRYL) capsule 25 mg
25 | Freq: Once | ORAL | Status: AC | PRN
Start: 2018-08-13 — End: 2018-08-13
  Administered 2018-08-13: 14:00:00 25 mg via ORAL

## 2018-08-13 MED ORDER — heparin lock flush Syrg 500 Units
100 | Freq: Every day | INTRAVENOUS | Status: AC | PRN
Start: 2018-08-13 — End: 2018-08-13
  Administered 2018-08-13: 16:00:00 500 [IU]

## 2018-08-13 MED ORDER — dexAMETHasone (DECADRON) tablet 4 mg
4 | Freq: Once | ORAL | Status: AC
Start: 2018-08-13 — End: 2018-08-13
  Administered 2018-08-13: 14:00:00 4 mg via ORAL

## 2018-08-13 MED ORDER — sodium chloride flush 10 mL
Freq: Every day | INTRAMUSCULAR | Status: AC | PRN
Start: 2018-08-13 — End: 2018-08-13

## 2018-08-13 MED ORDER — diphenhydrAMINE (BENADRYL) injection 25 mg
50 | Freq: Once | INTRAMUSCULAR | Status: AC
Start: 2018-08-13 — End: 2018-08-13

## 2018-08-13 MED ORDER — famotidine (PEPCID) tablet 20 mg
20 | Freq: Once | ORAL | Status: AC | PRN
Start: 2018-08-13 — End: 2018-08-13
  Administered 2018-08-13: 14:00:00 20 mg via ORAL

## 2018-08-13 MED ORDER — PACLitaxel (TAXOL) 120 mg in sodium chloride 0.9 % 250 mL chemo infusion
6 | Freq: Once | INTRAVENOUS | Status: AC
Start: 2018-08-13 — End: 2018-08-13
  Administered 2018-08-13: 15:00:00 120 mg/m2 via INTRAVENOUS

## 2018-08-13 MED ORDER — LORazepam (ATIVAN) tablet 0.5 mg
0.5 | Freq: Four times a day (QID) | ORAL | Status: AC | PRN
Start: 2018-08-13 — End: 2018-08-13

## 2018-08-13 MED ORDER — EPINEPHrine 1 mg/mL injection
1 | Freq: Every day | INTRAMUSCULAR | Status: AC | PRN
Start: 2018-08-13 — End: 2018-08-13

## 2018-08-13 MED ORDER — diphenhydrAMINE (BENADRYL) injection 25 mg
50 | Freq: Every day | INTRAMUSCULAR | Status: AC | PRN
Start: 2018-08-13 — End: 2018-08-13

## 2018-08-13 MED FILL — FAMOTIDINE 20 MG TABLET: 20 20 MG | ORAL | Qty: 1

## 2018-08-13 MED FILL — DIPHENHYDRAMINE 25 MG CAPSULE: 25 25 mg | ORAL | Qty: 1

## 2018-08-13 MED FILL — PACLITAXEL 6 MG/ML CONCENTRATE,INTRAVENOUS: 6 6 mg/mL | INTRAVENOUS | Qty: 20

## 2018-08-13 MED FILL — DEXAMETHASONE 4 MG TABLET: 4 4 MG | ORAL | Qty: 1

## 2018-08-13 NOTE — Nursing Note (Signed)
0272 am - Port accessed per protocol without difficulty, positive blood return noted. Labs drawn as ordered from [facility].    See flow sheet for details.

## 2018-08-13 NOTE — Progress Notes (Signed)
Pt with ANC 1353 today on C5D15 and has Day 21 off with neulasta tomorrow. Will proceed with treatment given growth factor being given tomorrow.

## 2018-08-13 NOTE — Unmapped (Signed)
Here for chemo, doing well, no c/o's, labs drawn from [facility] earlier, chemotherapy consent verified, see flowsheets.  Fall assessment preformed, pt low fall risk.  ANC 1352 today, CNP [place] notified, okay to treat today, patient is on the scheudle for a neulasta injection tomorrow at [facility], plan originally called for neulasta kit, but patient preferred to come back in for injection so she could have the luxury to shower without the pod.  Treatment tol well, port deaccessed, pt d/ced amb with AVS.

## 2018-08-14 ENCOUNTER — Ambulatory Visit: Admit: 2018-08-14 | Discharge: 2018-08-14 | Payer: Medicare (Managed Care)

## 2018-08-14 DIAGNOSIS — C563 Malignant neoplasm of bilateral ovaries (CMS-HCC): Secondary | ICD-10-CM

## 2018-08-14 MED ORDER — pegfilgrastim (NEULASTA) injection 6 mg
6 | Freq: Once | SUBCUTANEOUS | Status: AC
Start: 2018-08-14 — End: 2018-08-14
  Administered 2018-08-14: 13:00:00 6 mg via SUBCUTANEOUS

## 2018-08-14 MED FILL — NEULASTA 6 MG/0.6 ML SUBCUTANEOUS SYRINGE: 6 6 mg/0.6 mL | SUBCUTANEOUS | Qty: 0.6

## 2018-08-14 NOTE — Unmapped (Signed)
NEULASTA  (Noo-lasta)  Patient Education Quick Reference Guide    Uses For This Medication  ??? Many chemotherapy medications reduce the number of germ fighting white blood cells, which increases the risk of infection. Neulasta helps to prevent this by increasing the number of white blood cells in patients who are receiving chemotherapy.  ??? This medication may also be given for other conditions as determined by your doctor.  How This Medication Works  How This Medication Works  Neulasta is known as a ???colony stimulating factor??? or ???white blood cell growth factor???. This medication is a man-made version of a substance that is naturally produced in your body which helps the bone marrow to make new white blood cells. Your doctor or healthcare provider may recommend that you have regular blood tests to count the number of white blood cells in your body. It is important that you follow your doctor or healthcare provider???s instructions about these tests.    Benefits Of This Medication  Neulasta is given to prevent your white blood cell levels from becoming too low during chemotherapy treatment. This helps prevent the development of infections and helps to ensure that you will continue to receive your chemotherapy medications on time at the appropriate dose.  Who Should Not Take This Medication  Who Should Not Take This Medication  You should not take this medication if you:  ??? Are allergic to other products made using a bacteria called E coli  ??? Are allergic to filgrastim, Neulasta or any of its components    Precautions to be Aware of Before Taking This Medication  Allergy related precautions  The parent drug of Neulasta is called filgrastim. Rarely, filgrastim may cause allergic reactions. These allergic reactions can cause rash, hives, shortness of breath, wheezing, a drop in blood pressure, swelling around the mouth  or eyes, fast pulse, or sweating. Although allergic reactions have not been reported with pegfilgrastim,  it is possible for them to occur. Your doctor or healthcare provider will watch you carefully during and after the administration  of Neulasta to make sure that you do not experience any allergic reactions. If you are receiving Neulasta at home, you should tell your doctor or healthcare provider about any allergic type symptoms. If an allergic reaction occurs it is treatable with medications.  )  Blood related precautions  ??? The parent drug of Neulasta is called filgrastim. Filgrastim has been reported to cause severe sickle cell crisis in patients who have sickle cell disease. If you have sickle cell disease, make sure that you tell your doctor or  healthcare provider.  ??? Although Neulasta can reduce the risk of infection, it may not prevent all infections. An infection can still happen when your white blood cell levels are low. You should watch for symptoms of an infection such as fever (temperature of 100.5 ??F or higher), chills, sore throat, diarrhea, or redness, swelling, or pain around a cut. If you think you might have an infection, let your doctor or healthcare provider know immediately.      Organ related precautions  ??? The parent drug of Neulasta is called filgrastim. Filgrastim has been reported to cause acute respiratory distress syndrome (ARDS). This is a life-threatening condition in which swelling and fluid build up in the lungs  and leads to low oxygen levels in the blood. If you develop difficulty breathing, you should let your doctor or healthcare provider know immediately.  ??? The parent drug of Neulasta is called filgrastim. Rarely, filgrastim   has been reported to cause problems with your spleen (splenic rupture). Symptoms can include pain in the upper left portion of the abdomen or in the shoulder. Although a rupture of the spleen has not been reported with pegfilgrastim, it is possible for it to occur. Report any abdominal pain to your doctor or healthcare provider immediately.    Patient  specific precautions  ??? It is not known if this medication is safe and effective in children.    Pregnancy and breastfeeding precautions  ! When taking this medication, you should use effective birth control to prevent pregnancy. Tell your doctor or healthcare provider right away if you or your spouse/partner becomes pregnant since this medication may cause fetal harm.  ! It is not known whether this medication is found or excreted in breast milk. Since many medications are excreted in breast milk and because this medication can cause serious harmful reactions in infants, breastfeeding should be avoided.    Administration related precautions  Neulasta should not be given during the time between 14 days before and 24 hours after chemotherapy, or while you are receiving radiation therapy.    Medication And Food Interactions  Before using this medication, tell your doctor or healthcare provider of all prescription or over-the-counter products you are taking, including dietary supplements or vitamins, herbal medicines and homeopathic remedies. Do not start or stop any medication without your doctor or healthcare provider???s approval. Possible interactions can occur with Neulasta and the following medications:  ??? Lithium  Side Effects  Side Effects  ??? All medications can cause side effects. However, not all patients will experience these side effects. In addition, other side effects not listed can also occur in some patients. You should call your doctor or healthcare provider if you have any questions or concerns while you are on this medication.  ??? You should contact your doctor or healthcare provider if you experience any side effect(s) which do not go away, worsen, are serious in nature, or are worrisome to you.  Side Effects (continued)  More common side effects  ?? Bone and/or muscle pain [Acetaminophen (Tylenol) may be taken for pain relief (follow package directions)]  ?? Redness, swelling, or itching at site of  injection    Rare side effects  ?? Allergic reaction which can cause rash, itching, red blotches, swollen face or lips, difficulty breathing (see Precautions To Be Aware Of Before Taking This Medication)  ?? Enlarged or ruptured spleen (see Precautions To Be Aware Of Before Taking This Medication)  How This Medication  How To Take This Medication  ??? This medication is usually given by an injection under the skin (subcutaneous or SC injection) but can also be given by injection into a vein (IV).  ??? If you or a family member are giving or receiving the Neulasta injection at home, you should review the detailed information provided by the drug manufacturer on this subject. Read this information carefully and make sure that you understand how to prepare the injection, how to properly use the disposable syringes, and how to give the injection. If you have any questions about this information, check with your doctor or healthcare provider.  ??? When this medication is given as an injection under the skin (subcutaneous), there are four common areas where injections may be given:  ?? The outer area of your upper arms  ?? The abdomen, except for the two inch area around your navel  ?? The front of your middle thighs  ?? The   upper outer areas of your buttocks  It is best to rotate the areas where the injection is given to avoid soreness. It is best to avoid giving an injection in areas that are tender, red, bruised, hard, or that contain scars or stretch marks.  ??? In the unlikely event of an overdose of this medication contact your doctor, your local poison control center at 1-800-222-1222, or emergency services immediately.  Proper Storage  Proper Storage  ??? Unopened containers should be stored in the refrigerator. Keep in original package to protect from light.  ??? Before being injected, Neulasta may be allowed to reach room temperature for a maximum of 48 hours. During this time it should still remain in the original package  protected from light.  ??? Keep the used syringes and needles in a special container.  ??? Keep this medication out of the reach of children or pets.  ??? Ask your doctor or healthcare provider how to dispose of any medication that you no longer use.  Disclaimer  Disclaimer  This handout is to provide you with additional information about pegfilgrastim. It is not a substitute or replacement for the expertise and judgment of your healthcare provider. The information is not intended to cover all possible uses, directions, precautions, medication interactions, or side effects. In addition, this information should not be interpreted to suggest that the use of a particular medication is safe, appropriate, or effective for you. You should always talk with your healthcare provider before starting or stopping any medication.      Neulasta (Neulasta?) Patient Education Quick Reference Guide  ?? 2005 [employer] All Rights Reserved.   Instructions for Patient:

## 2018-08-14 NOTE — Unmapped (Signed)
Pt here for injection. Pt provided with written teaching material re: neulasta and reviewed with pt. Questions answered. Injection given, tol well. AVS containing next appt printed, given to pt and reviewed with pt.  Left area ambulatory.

## 2018-08-27 ENCOUNTER — Ambulatory Visit: Admit: 2018-08-27 | Payer: Medicare (Managed Care)

## 2018-08-27 ENCOUNTER — Other Ambulatory Visit: Admit: 2018-08-27 | Payer: Medicare (Managed Care)

## 2018-08-27 DIAGNOSIS — C569 Malignant neoplasm of unspecified ovary: Secondary | ICD-10-CM

## 2018-08-27 DIAGNOSIS — C563 Malignant neoplasm of bilateral ovaries (CMS-HCC): Secondary | ICD-10-CM

## 2018-08-27 LAB — COMPREHENSIVE METABOLIC PANEL
ALT: 16 U/L (ref 7–52)
AST: 20 U/L (ref 13–39)
Albumin: 4.2 g/dL (ref 3.5–5.7)
Alkaline Phosphatase: 77 U/L (ref 36–125)
Anion Gap: 7 mmol/L (ref 3–16)
BUN: 16 mg/dL (ref 7–25)
CO2: 27 mmol/L (ref 21–33)
Calcium: 8.8 mg/dL (ref 8.6–10.3)
Chloride: 102 mmol/L (ref 98–110)
Creatinine: 0.59 mg/dL — ABNORMAL LOW (ref 0.60–1.30)
Glucose: 122 mg/dL — ABNORMAL HIGH (ref 70–100)
Osmolality, Calculated: 284 mosm/kg (ref 278–305)
Potassium: 4 mmol/L (ref 3.5–5.3)
Sodium: 136 mmol/L (ref 133–146)
Total Bilirubin: 0.4 mg/dL (ref 0.0–1.5)
Total Protein: 6.4 g/dL (ref 6.4–8.9)
eGFR AA CKD-EPI: >90 See note.
eGFR NONAA CKD-EPI: 88 See note.

## 2018-08-27 LAB — DIFFERENTIAL
Basophils Absolute: 25 /uL (ref 0–200)
Basophils Relative: 0.4 % (ref 0.0–1.0)
Eosinophils Absolute: 19 /uL (ref 15–500)
Eosinophils Relative: 0.3 % (ref 0.0–8.0)
Lymphocytes Absolute: 1166 /uL (ref 850–3900)
Lymphocytes Relative: 18.8 % (ref 15.0–45.0)
Monocytes Absolute: 353 /uL (ref 200–950)
Monocytes Relative: 5.7 % (ref 0.0–12.0)
Neutrophils Absolute: 4638 /uL (ref 1500–7800)
Neutrophils Relative: 74.8 % (ref 40.0–80.0)
nRBC: 0 /100 WBC (ref 0–0)

## 2018-08-27 LAB — CA 125: CA 125: 8.7 U/mL (ref 5.5–35.0)

## 2018-08-27 LAB — CBC
Hematocrit: 26.1 % — ABNORMAL LOW (ref 35.0–45.0)
Hemoglobin: 9.2 g/dL — ABNORMAL LOW (ref 11.7–15.5)
MCH: 36.7 pg — ABNORMAL HIGH (ref 27.0–33.0)
MCHC: 35.3 g/dL (ref 32.0–36.0)
MCV: 103.9 fL — ABNORMAL HIGH (ref 80.0–100.0)
MPV: 8.2 fL (ref 7.5–11.5)
Platelets: 144 10E3/uL (ref 140–400)
RBC: 2.51 10E6/uL — ABNORMAL LOW (ref 3.80–5.10)
RDW: 20.3 % — ABNORMAL HIGH (ref 11.0–15.0)
WBC: 6.2 10E3/uL (ref 3.8–10.8)

## 2018-08-27 LAB — MAGNESIUM: Magnesium: 1.7 mg/dL (ref 1.5–2.5)

## 2018-08-27 MED ORDER — famotidine (PEPCID) tablet 20 mg
20 | Freq: Once | ORAL | Status: AC | PRN
Start: 2018-08-27 — End: 2018-08-27

## 2018-08-27 MED ORDER — famotidine (PF) (PEPCID) injection 20 mg
20 | Freq: Once | INTRAVENOUS | Status: AC
Start: 2018-08-27 — End: 2018-08-27
  Administered 2018-08-27: 15:00:00 20 mg via INTRAVENOUS

## 2018-08-27 MED ORDER — sodium chloride flush 10 mL
Freq: Every day | INTRAMUSCULAR | Status: AC | PRN
Start: 2018-08-27 — End: 2018-08-27

## 2018-08-27 MED ORDER — diphenhydrAMINE (BENADRYL) injection 25 mg
50 | Freq: Once | INTRAMUSCULAR | Status: AC
Start: 2018-08-27 — End: 2018-08-27

## 2018-08-27 MED ORDER — albuterol (PROVENTIL;VENTOLIN;PROAIR) inhaler 1-2 puff
90 | RESPIRATORY_TRACT | Status: AC | PRN
Start: 2018-08-27 — End: 2018-08-27

## 2018-08-27 MED ORDER — heparin lock flush Syrg 500 Units
100 | Freq: Every day | INTRAVENOUS | Status: AC | PRN
Start: 2018-08-27 — End: 2018-08-27
  Administered 2018-08-27: 18:00:00 500 [IU]

## 2018-08-27 MED ORDER — palonosetron (ALOXI) injection 0.25 mg
0.25 | Freq: Once | INTRAVENOUS | Status: AC
Start: 2018-08-27 — End: 2018-08-27
  Administered 2018-08-27: 15:00:00 0.25 mg via INTRAVENOUS

## 2018-08-27 MED ORDER — LORazepam (ATIVAN) tablet 0.5 mg
0.5 | Freq: Four times a day (QID) | ORAL | Status: AC | PRN
Start: 2018-08-27 — End: 2018-08-27

## 2018-08-27 MED ORDER — EPINEPHrine 1 mg/mL injection
1 | Freq: Every day | INTRAMUSCULAR | Status: AC | PRN
Start: 2018-08-27 — End: 2018-08-27

## 2018-08-27 MED ORDER — sodium chloride 0.9 % infusion
Freq: Every day | INTRAVENOUS | Status: AC | PRN
Start: 2018-08-27 — End: 2018-08-27
  Administered 2018-08-27: 15:00:00 25 mL/h via INTRAVENOUS

## 2018-08-27 MED ORDER — hydrocortisone sod succ (PF)(SOLU-CORTEF) 100 mg injection
100 | Freq: Every day | INTRAMUSCULAR | Status: AC | PRN
Start: 2018-08-27 — End: 2018-08-27

## 2018-08-27 MED ORDER — dexAMETHasone (DECADRON) tablet 12 mg
4 | Freq: Once | ORAL | Status: AC
Start: 2018-08-27 — End: 2018-08-27
  Administered 2018-08-27: 15:00:00 12 mg via ORAL

## 2018-08-27 MED ORDER — PACLitaxel (TAXOL) 120 mg in sodium chloride 0.9 % 250 mL chemo infusion
Freq: Once | INTRAVENOUS | Status: AC
Start: 2018-08-27 — End: 2018-08-27
  Administered 2018-08-27: 16:00:00 120 mg/m2 via INTRAVENOUS

## 2018-08-27 MED ORDER — aprepitant (CINVANTI) IV emulsion 130 mg
7.2 | Freq: Once | INTRAVENOUS | Status: AC
Start: 2018-08-27 — End: 2018-08-27
  Administered 2018-08-27: 15:00:00 130 mg via INTRAVENOUS

## 2018-08-27 MED ORDER — CARBOplatin (PARAPLATIN) 500 mg in dextrose 5% in water (D5W) 250 mL chemo infusion
Freq: Once | INTRAVENOUS | Status: AC
Start: 2018-08-27 — End: 2018-08-27
  Administered 2018-08-27: 17:00:00 500 mg via INTRAVENOUS

## 2018-08-27 MED ORDER — diphenhydrAMINE (BENADRYL) capsule 25 mg
25 | Freq: Once | ORAL | Status: AC | PRN
Start: 2018-08-27 — End: 2018-08-27
  Administered 2018-08-27: 15:00:00 25 mg via ORAL

## 2018-08-27 MED ORDER — proCHLORPERazine (COMPAZINE) tablet 10 mg
10 | Freq: Four times a day (QID) | ORAL | Status: AC | PRN
Start: 2018-08-27 — End: 2018-08-27

## 2018-08-27 MED ORDER — diphenhydrAMINE (BENADRYL) injection 25 mg
50 | Freq: Every day | INTRAMUSCULAR | Status: AC | PRN
Start: 2018-08-27 — End: 2018-08-27

## 2018-08-27 MED FILL — CARBOPLATIN 10 MG/ML INTRAVENOUS SOLUTION: 10 10 mg/mL | INTRAVENOUS | Qty: 50

## 2018-08-27 MED FILL — FAMOTIDINE (PF) 20 MG/2 ML INTRAVENOUS SOLUTION: 20 20 mg/2 mL | INTRAVENOUS | Qty: 2

## 2018-08-27 MED FILL — DIPHENHYDRAMINE 25 MG CAPSULE: 25 25 mg | ORAL | Qty: 1

## 2018-08-27 MED FILL — CINVANTI 7.2 MG/ML INTRAVENOUS EMULSION: 7.2 7.2 mg/mL | INTRAVENOUS | Qty: 18

## 2018-08-27 MED FILL — PACLITAXEL 6 MG/ML CONCENTRATE,INTRAVENOUS: 6 6 mg/mL | INTRAVENOUS | Qty: 20

## 2018-08-27 MED FILL — DEXAMETHASONE 4 MG TABLET: 4 4 MG | ORAL | Qty: 3

## 2018-08-27 MED FILL — PALONOSETRON 0.25 MG/5 ML INTRAVENOUS SOLUTION: 0.25 0.25 mg/5 mL | INTRAVENOUS | Qty: 5

## 2018-08-27 NOTE — Unmapped (Signed)
0855 Port accessed per protocol. Blood return noted. Flushed with 10ml of normal saline, occlusive dressing placed over the huber needle.  Labs drawn via port.

## 2018-08-27 NOTE — Unmapped (Signed)
History of Present Illness  Kelsey Sullivan is a 77 y.o. female with   Chief Complaint   Patient presents with   ??? 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    Ms. 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 She declined adjuvant therapy. She used natural methods, went to a raw diet for a year, etc.  She developed a small bowel obstruction in 03/2017, resolved on its own, with NED at that time.  In 01/2018, she experienced mid abdominal pain, and seen by PCP and ED, with CT of abdomen and pelvis showing interval development of necrotic retroperitoneal lymphadenopathy with largest node measuring 4.9  4.2 x 4.8 cm. Her CA 125 on 02/18/18 was 197.8.  She underwent a ct-guided nodal biopsy on 02/26/18, with pathology confirming recurrent disease.  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 She was seen at [facility] cancer in [place] with Dr. Ishmael Holter. She is undergoing chemotherapy with Carboplatin and Paclitaxel-weekly.      She was admitted from 7/31-07/13/18 for a SBO. ??Her small bowel obstruction was treated conservatively with NPO diet and NG tube. Thi sresolved    She is seen today for PCE.  Kelsey Sullivan is doing well. She is tired, but has had no problems since her hospitalization.   She denies any bloating or fullness.  She reports normal bowel/bladder habits.   She denies any GU  concerns. Denies any dysuria, frequency, hematuria, flank pain or fevers.  She denies any vaginal bleeding or discharge.   She has mild neuropathy.   She reports no leg swelling.   Her appetite is good, and reports no nausea or vomiting. Tolerating solid foods.      Patient Active Problem List    Diagnosis   ??? Small bowel obstruction (CMS Dx)   ??? H/O small bowel obstruction   ??? 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 Pt declined adjuvant therapy      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     Posterior cul de sac biopsy-metastatic adenocarcinoma.     Omentectomy- metastatic adenocarcinoma     LND-negative     Residual right IP ligament, Anterior cul de  sac biopsy, right pelvic side will biopsy, left pelvic side wall, right gutter and left gutter-->benign    04/06/2017:   CT [name]- mild small bowel obstruction with transition point in the left pelvis adjacent to the anterior abdominal wall. Low density in the common femoral veins bilaterally thought likely to represent flow artificat, may consider follow up with lomer extremity Doppler to ensure there is no underlying deep venous thrombosis.    01/29/2018:   CTAP:  Interval development of necrotic retroperitoneal lymphadenopathy with largest node measuring 4.9  4.2 x 4.8 cm. Origin is indeterminate.  Options would include CT directed biopsy as well as PET/CT.    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  No other focal areas of FD avid malignancy are found. Specifically, no lymph node activity is found outside of the retroperitoneum and there is no evidence for FDG avid pulmonary, skeletal or hepatic parenchymal metastasis.    02/2018:   Patient saw Dr. Ishmael Holter who recommended Carbo/Taxol with an AUC of 6.  Dr. Maren Reamer office submitted foundation one testing. Patient would like Genetic Testing    04/12/2018:   IR port placed    04/16/2018:   C1 weekly taxol 80 mg/m2 D1,8,15, Carboplatin AUC 6 D1 q 21 days. PCEs on thurs, txt on fridays. EXAMS ON EVEN CYCLES     CA 125: 523.4     Genetic testing: Neg for CS mutation, no VUS, lifetime remaining breast cancer risk -3.4%    05/07/2018:   C2 weekly taxol 80 mg/m2 D1,8,15, Carboplatin AUC 6 D1 q 21 days. PCEs on thurs, txt on fridays. EXAMS ON EVEN CYCLES     CA 125: 162.2    05/20/2018:   Early PCE due to scheduling, due 6/21    05/28/2018  Delay C3 due to low ANC     CA 125: 25    06/04/2018  C3 weekly taxol 80 mg/m2 D1,8,15, Carboplatin AUC 6 D1 q 21 days. EXAMS ON EVEN CYCLES.      CA 125    06/25/2018:   HOLD chemo due to low anc (1047)- defer to next week, and change to q 28 days cycle to build in an off week to allow for counts to recover     CA 125: 10.6    07/02/2018:   C4 weekly taxol 80 mg/m2 D1,8,15, (missed day 5 due to admission) Carboplatin AUC 6 D1 q 28 days. ANC still lowish 1480s- neulasta Day 16      EXAMS ON EVEN CYCLES.  Review her genetics testing results      CA 125 7.2    07/07/2018:   [facility] admission:  SBO, neutropenic fever     CTAP:Small bowel obstruction with transition point in the anterior mid abdomen near the midline. Small amount of free fluid in the abdomen, which is nonspecific. 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     Peritoneum: Small amount of free fluid in the abdomen. A para-aortic density with central low attenuation measures 2.5 x  2 cm and is significantly decreased in size from prior.    07/16/2018:   CA 125: 28.5    07/30/2018:  C5 weekly taxol 80 mg/m2 D1,8,15, Carboplatin AUC 6 D1 q 28 days. Neulasta Day 16      EXAMS ON EVEN CYCLES.      CA 125: 11.4    08/27/2018:   C6 weekly taxol 80 mg/m2 D1,8,15, Carboplatin AUC 6 D1 q 28 days. Neulasta Day 16. Order Ct CAP. RTC 10/11 (3 weeks)     EXAMS ON EVEN CYCLES. Discussed option of maintenance parp I- pt not decided (doesn't want to feel tired anymore)     CA 125: 8.7    Disposition: chemo, imaging after 3 cucles, CA 125, possible parp maintenance after, genetic testing neg, foundation one testing results from [facility] in [place]  Current disease status: Neg for CS mutation, no VUS, lifetime remaining breast cancer risk -3.4%  Genetics: negative for mutations, breast cancer lifetime risk 3.4%  Survivorship plan: pending completion of treatment              Past Medical History  She  has a past medical history of Bowel obstruction (CMS Dx) (2018) and Ovarian cancer (CMS Dx) (2014).  Past Medical History:   Diagnosis Date   ??? Bowel obstruction (CMS Dx) 2018   ??? Ovarian cancer (CMS Dx) 2014       Past Surgical History  Past Surgical History:   Procedure Laterality Date   ??? ABDOMINOPLASTY  1999   ??? BLEPHAROPLASTY  2000   ??? DILATION AND CURETTAGE OF UTERUS  1979   ??? ELBOW SURGERY  1990   ??? EYE SURGERY  1994   ??? KNEE ARTHROPLASTY Right    ??? ROTATOR CUFF REPAIR     ??? TRANSUMBILICAL AUGMENTATION MAMMAPLASTY     ??? VAGINAL HYSTERECTOMY  1979       Family History  History reviewed. No pertinent  family history.    Family cancer history for ovarian, uterine and colon cancer is negative other than above.       Social History  Social History     Social History   ??? Marital status: Divorced     Spouse name: N/A   ??? Number of children: N/A   ??? Years of education: N/A     Social History Main Topics   ??? Smoking status: Former Smoker   ??? Smokeless tobacco: Never Used   ??? Alcohol use Yes      Comment: 2-3 drinks a day   ??? Drug use: No   ??? Sexual activity: Not Asked     Other Topics Concern   ??? Caffeine Use No   ??? Occupational Exposure No   ??? Exercise Yes   ??? Seat Belt Yes     Social History Narrative    Mammogram-6 years ago and it was normal. Pt stated she isn't getting them anymore.      Colonoscopy-10 years ago        Pt reports she can lie flat in bed without SOB.    Pt states she can walk a flight of stairs and city block without chest pain and SOB>         Past OB/GYN History  G3P2  She reports menarche at age 50 and menopause at age 65-surgical.  She denies a history of STIs.  She denies a history of abnormal cervical cytology and reports her last cytologic examination she is unsure of. She has taken  HRT.     Health maintenance:  Mammogram: Date 6 years ago Results normal  Colonoscopy: Date 10 years ago Results normal    Allergies  She is allergic to bactrim [sulfamethoxazole-trimethoprim]; nsaids (non-steroidal anti-inflammatory drug); celecoxib; and metoclopramide.    BMI  Body mass index is 20.77 kg/m??.    Disease Status: Systemic recurrence of disease (04/01/2018 12:43 PM)          Histories  She has a past medical history of Bowel obstruction (CMS Dx) (2018) and Ovarian cancer (CMS Dx) (2014).    She has a past surgical history that includes Dilation and curettage of uterus (1979); Vaginal hysterectomy (1979); Elbow surgery (1990); Eye surgery (1994); Abdominoplasty (1999); Blepharoplasty (2000); Transumbilical augmentation mammaplasty; Rotator cuff repair; and Knee Arthroplasty (Right).    Her family  history is not on file.    She reports that she has quit smoking. She has never used smokeless tobacco. She reports that she drinks alcohol. She reports that she does not use drugs.    Allergies  Bactrim [sulfamethoxazole-trimethoprim]; Nsaids (non-steroidal anti-inflammatory drug); Celecoxib; and Metoclopramide    Medications  Outpatient Encounter Prescriptions as of 08/27/2018   Medication Sig Dispense Refill   ??? ascorbic acid, vitamin C, (VITAMIN C) 1000 MG tablet Take 1,000 mg by mouth daily.     ??? dexamethasone (DECADRON) 4 MG tablet Take 8 mg by mouth daily with food on day 2, 3, and 4 of chemotherapy cycle 18 tablet 6   ??? digestive enzymes Cap Take by mouth.     ??? ergocalciferol, vitamin D2, (VITAMIN D ORAL) Take 10,000 Units by mouth daily.            ??? GLUTATHIONE-L ORAL Take by mouth daily.     ??? Lactobac no.41/Bifidobact no.7 (PROBIOTIC-10 ORAL) Take 1 capsule by mouth daily. [employer] brand     ??? MAGNESIUM SULFATE TOP Apply topically daily.     ??? vit b complex w-b 12 (B COMPLEX-VITAMIN B12) tablet Take 1 tablet by mouth daily.     ??? ibuprofen (ADVIL,MOTRIN) 200 MG tablet Take 200-800 mg by mouth every 8 hours as needed for Pain.     ??? ibuprofen-diphenhydramine HCl 200-25 mg Cap Take 1 tablet by mouth at bedtime as needed.     ??? lidocaine-prilocaine (EMLA) cream Apply topically daily as needed. Apply pea-size amount to port site 30-60 min prior to accessing port 30 g 1   ??? omeprazole (PRILOSEC) 20 MG capsule Take 20 mg by mouth daily.            ??? ondansetron (ZOFRAN) 4 MG tablet Take 1 tablet (4 mg total) by mouth every 8 hours as needed for Nausea. Must wait to take until 5 days after receiving Aloxi 20 tablet 2   ??? proCHLORPERazine (COMPAZINE) 10 MG tablet Take 1 tablet (10 mg total) by mouth every 6 hours as needed (For nausea and vomiting). 30 tablet 3   ??? vitamin E 100 UNIT capsule Take 100 Units by mouth daily.       No facility-administered encounter medications on file as of 08/27/2018.             Review of Systems   Constitutional: Positive for fatigue. Negative for activity change, appetite change, chills, fever, weight gain and weight loss.   HENT: Negative for congestion, mouth sores, rhinorrhea, sinus pressure, sore throat and trouble swallowing.    Eyes: Negative for photophobia, redness, itching and visual disturbance.   Respiratory: Negative for  apnea, cough, chest tightness and shortness of breath.    Cardiovascular: Negative for chest pain, palpitations and leg swelling.   Gastrointestinal: Negative for abdominal distention, abdominal pain, anal bleeding, bloating, blood in stool, constipation, diarrhea, nausea and vomiting.   Genitourinary: Negative for decreased urine volume, difficulty urinating, dysuria, frequency, genital sores, hematuria, pelvic pain, urgency, vaginal bleeding, vaginal discharge and vaginal pain.   Musculoskeletal: Negative for arthralgias, back pain, joint swelling and neck pain.   Skin: Negative for color change, pallor and wound.   Neurological: Positive for numbness. Negative for dizziness, seizures, weakness and headaches.   Hematological: Negative for adenopathy. Does not bruise/bleed easily.   Psychiatric/Behavioral: Negative for agitation, confusion and depression. The patient is nervous/anxious.        Vitals  Blood pressure 96/61, pulse 79, temperature 97.8 ??F (36.6 ??C), temperature source Temporal, resp. rate 15, height 5' 4 (1.626 m), weight 121 lb (54.9 kg), SpO2 99 %.    Physical Exam   Vitals reviewed.  Constitutional: She is oriented to person, place, and time. She appears well-developed and well-nourished.   HENT:   Head: Normocephalic and atraumatic.   Eyes: Conjunctivae and EOM are normal.   Neck: Normal range of motion. Neck supple. No thyromegaly present.   Cardiovascular: Normal rate, regular rhythm and normal heart sounds.    Pulmonary/Chest: Effort normal and breath sounds normal. No respiratory distress. She has no wheezes. She has no rales.    Abdominal: Soft. Bowel sounds are normal. She exhibits no distension and no mass. There is no tenderness. There is no rebound and no guarding.   No masses or tenderness.  No hernia palpable.   No hepatosplenomegaly.     Prior scars from xlap, tummy tuck   Genitourinary:   Genitourinary Comments: EXAMS ON EVEN CYCLES  Exam 08/27/18 by CB    Normal external genitalia/vulva.  Normal urethral meatus, urethra, bladder, anus and perineum.  Normal vagina and vaginal cuff.  Uterus, cervix and adnexa surgically absent.  Normal rectum.  There is no nodularity in the posterior culdesac or rectovaginal septum.       Musculoskeletal: Normal range of motion. She exhibits no edema.   Lymphadenopathy:     She has no cervical adenopathy.   Neurological: She is alert and oriented to person, place, and time.   Skin: Skin is warm and dry.        Psychiatric: She has a normal mood and affect. Her behavior is normal. Judgment and thought content normal.          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       L ovary and fallopian tube- serous adenocarcinoma,  high grade, 4 cm, favor metastasis.     Posterior cul de sac biopsy-metastatic adenocarcinoma.     Omentectomy- metastatic adenocarcinoma     LND-negative     Residual right IP ligament, Anterior cul de sac biopsy, right pelvic side will biopsy,  left pelvic side wall, right gutter and left gutter-->benign    04/06/2017:   CT [name]- mild small bowel obstruction with transition point in the left pelvis adjacent to the anterior abdominal wall. Low density in the common femoral veins bilaterally thought likely to represent flow artificat, may consider follow up with lomer extremity Doppler to ensure there is no underlying deep venous thrombosis.    01/29/2018:   CTAP:  Interval development of necrotic retroperitoneal lymphadenopathy with largest node measuring 4.9  4.2 x 4.8 cm. Origin is indeterminate.  Options would include CT directed biopsy as well as PET/CT.    02/18/2018:   CA 125:  197.8    02/26/2018:   CT guided Left retroperitoneal node biopsy- focally involved by non-small cell malignancy- additional stains are diagnostic of non-small cell carcionoma and supportive of involve100ment by the patients reported previous known ovarian malignancy.    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  No other focal areas of FD avid malignancy are found. Specifically, no lymph node activity is found outside of the retroperitoneum and there is no evidence for FDG avid pulmonary, skeletal or hepatic parenchymal metastasis.    Cancer Staging:  Cancer Staging  Malignant neoplasm of both ovaries (CMS Dx)  Staging form: Ovary, Fallopian Tube, And Primary Peritoneal Carcinoma, AJCC 8th Edition  - Clinical: FIGO Stage IIIC - Signed by Mikle Bosworth, MD on 04/01/2018      Disease Status: Systemic recurrence of disease (04/01/2018 12:43 PM)           Assessment & Plan  My impression is Ms. 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    We discussed recurrent ovarian cancer in detail and agree with the recommendation of Dr. Ishmael Holter for treatment platinum-based chemotherapy with Carboplatin and Paclitaxel.  The patient is interested in dose-dense Paclitaxel and we will proceed in this manner.      Today she presents for C6D1 of carbo/plus weekly taxol and day 16 neulasa q 21-->28days. She is tolerating chemotherapy appropriately well, and has experienced expected but manageable side effects. I have reviewed her labs, and we will continue treatment without dose modification. We will see her back in 3 weeks after she has undergone CT CAP, and she knows to call if questions or problems arise. We have ordered nadir and pre-chemo labs and will continue monitoring in between cycles.   __CT CAP in 2 weeks, RTC 3 weeks to review   __discussed option of parp I maintenance for her PS rec. She is not decided, but does not want to be tired anymore    Genetics:  previously discussed her negative genetic testing results.      Pain  Pain Score: Zero (08/27/2018  9:02 AM)  She denies presence of pain.  Pain will be reassessed at next visit.    \\  I have spent 30 minutes face to face with this patient with greater than 50% of this time spent in counseling and coordination of care discussing the above    Medical Decision Making:  The following items were considered in medical decision making:  Review / order clinical lab tests  Review / order radiology tests  Review / order other diagnostic tests/interventions      Maggie Font, MD, [facility]  Assistant Professor, Obstetrics and Gynecology  Division of Gynecologic Oncology  [phone]

## 2018-08-27 NOTE — Nursing Note (Signed)
Taxol and Carboplatin administration today with no signs of distress or complaint, patient slept most of infusion.  She requests oral benadryl only.  Magnesium WNL today.  Daughter was not present for infusion today but was driver.  Patient not a fall risk today.  Patient left in stable condition with AVS and future appt.

## 2018-08-31 NOTE — Unmapped (Signed)
Tried to review Care Everywhere at [facility] but not available.

## 2018-09-03 ENCOUNTER — Other Ambulatory Visit: Admit: 2018-09-03 | Payer: Medicare (Managed Care)

## 2018-09-03 ENCOUNTER — Ambulatory Visit: Admit: 2018-09-03 | Payer: Medicare (Managed Care)

## 2018-09-03 DIAGNOSIS — C563 Malignant neoplasm of bilateral ovaries (CMS-HCC): Secondary | ICD-10-CM

## 2018-09-03 LAB — CBC
Hematocrit: 24.1 % (ref 35.0–45.0)
Hemoglobin: 8.4 g/dL (ref 11.7–15.5)
MCH: 36.6 pg (ref 27.0–33.0)
MCHC: 35.1 g/dL (ref 32.0–36.0)
MCV: 104.2 fL (ref 80.0–100.0)
MPV: 8 fL (ref 7.5–11.5)
Platelets: 266 10*3/uL (ref 140–400)
RBC: 2.31 10*6/uL (ref 3.80–5.10)
RDW: 20 % (ref 11.0–15.0)
WBC: 4.4 10*3/uL (ref 3.8–10.8)

## 2018-09-03 LAB — DIFFERENTIAL
Basophils Absolute: 22 /uL (ref 0–200)
Basophils Relative: 0.5 % (ref 0.0–1.0)
Eosinophils Absolute: 22 /uL (ref 15–500)
Eosinophils Relative: 0.5 % (ref 0.0–8.0)
Lymphocytes Absolute: 1008 /uL (ref 850–3900)
Lymphocytes Relative: 22.9 % (ref 15.0–45.0)
Monocytes Absolute: 317 /uL (ref 200–950)
Monocytes Relative: 7.2 % (ref 0.0–12.0)
Neutrophils Absolute: 3032 /uL (ref 1500–7800)
Neutrophils Relative: 68.9 % (ref 40.0–80.0)
nRBC: 0 /100 WBC (ref 0–0)

## 2018-09-03 LAB — MAGNESIUM: Magnesium: 1.3 mg/dL (ref 1.5–2.5)

## 2018-09-03 LAB — COMPREHENSIVE METABOLIC PANEL
ALT: 18 U/L (ref 7–52)
AST: 18 U/L (ref 13–39)
Albumin: 4.2 g/dL (ref 3.5–5.7)
Alkaline Phosphatase: 71 U/L (ref 36–125)
Anion Gap: 8 mmol/L (ref 3–16)
BUN: 20 mg/dL (ref 7–25)
CO2: 26 mmol/L (ref 21–33)
Calcium: 9.1 mg/dL (ref 8.6–10.3)
Chloride: 99 mmol/L (ref 98–110)
Creatinine: 0.56 mg/dL (ref 0.60–1.30)
Glucose: 129 mg/dL (ref 70–100)
Osmolality, Calculated: 280 mOsm/kg (ref 278–305)
Potassium: 3.9 mmol/L (ref 3.5–5.3)
Sodium: 133 mmol/L (ref 133–146)
Total Bilirubin: 0.4 mg/dL (ref 0.0–1.5)
Total Protein: 6.3 g/dL (ref 6.4–8.9)
eGFR AA CKD-EPI: >90 See note.
eGFR NONAA CKD-EPI: 90 See note.

## 2018-09-03 MED ORDER — diphenhydrAMINE (BENADRYL) injection 25 mg
50 | Freq: Once | INTRAMUSCULAR | Status: AC
Start: 2018-09-03 — End: 2018-09-03

## 2018-09-03 MED ORDER — hydrocortisone sod succ (PF)(SOLU-CORTEF) 100 mg injection
100 | Freq: Every day | INTRAMUSCULAR | Status: AC | PRN
Start: 2018-09-03 — End: 2018-09-03

## 2018-09-03 MED ORDER — EPINEPHrine 1 mg/mL injection
1 | Freq: Every day | INTRAMUSCULAR | Status: AC | PRN
Start: 2018-09-03 — End: 2018-09-03

## 2018-09-03 MED ORDER — diphenhydrAMINE (BENADRYL) capsule 25 mg
25 | Freq: Once | ORAL | Status: AC | PRN
Start: 2018-09-03 — End: 2018-09-03
  Administered 2018-09-03: 14:00:00 25 mg via ORAL

## 2018-09-03 MED ORDER — heparin lock flush Syrg 500 Units
100 | Freq: Every day | INTRAVENOUS | Status: AC | PRN
Start: 2018-09-03 — End: 2018-09-03
  Administered 2018-09-03: 15:00:00 500 [IU]

## 2018-09-03 MED ORDER — famotidine (PF) (PEPCID) injection 20 mg
20 | Freq: Once | INTRAVENOUS | Status: AC
Start: 2018-09-03 — End: 2018-09-03
  Administered 2018-09-03: 14:00:00 20 mg via INTRAVENOUS

## 2018-09-03 MED ORDER — albuterol (PROVENTIL;VENTOLIN;PROAIR) inhaler 1-2 puff
90 | RESPIRATORY_TRACT | Status: AC | PRN
Start: 2018-09-03 — End: 2018-09-03

## 2018-09-03 MED ORDER — magnesium oxide (MAG-OX) 400 mg tablet
400 | ORAL_TABLET | Freq: Three times a day (TID) | ORAL | 0 refills | Status: AC
Start: 2018-09-03 — End: 2018-11-18

## 2018-09-03 MED ORDER — sodium chloride flush 10 mL
Freq: Every day | INTRAMUSCULAR | Status: AC | PRN
Start: 2018-09-03 — End: 2018-09-03

## 2018-09-03 MED ORDER — famotidine (PEPCID) tablet 20 mg
20 | Freq: Once | ORAL | Status: AC | PRN
Start: 2018-09-03 — End: 2018-09-03

## 2018-09-03 MED ORDER — PACLitaxel (TAXOL) 120 mg in sodium chloride 0.9 % 250 mL chemo infusion
Freq: Once | INTRAVENOUS | Status: AC
Start: 2018-09-03 — End: 2018-09-03
  Administered 2018-09-03: 14:00:00 120 mg/m2 via INTRAVENOUS

## 2018-09-03 MED ORDER — dexAMETHasone (DECADRON) tablet 4 mg
4 | Freq: Once | ORAL | Status: AC
Start: 2018-09-03 — End: 2018-09-03
  Administered 2018-09-03: 14:00:00 4 mg via ORAL

## 2018-09-03 MED ORDER — sodium chloride 0.9 % infusion
Freq: Every day | INTRAVENOUS | Status: AC | PRN
Start: 2018-09-03 — End: 2018-09-03
  Administered 2018-09-03: 14:00:00 25 mL/h via INTRAVENOUS

## 2018-09-03 MED ORDER — diphenhydrAMINE (BENADRYL) injection 25 mg
50 | Freq: Every day | INTRAMUSCULAR | Status: AC | PRN
Start: 2018-09-03 — End: 2018-09-03

## 2018-09-03 MED ORDER — LORazepam (ATIVAN) tablet 0.5 mg
0.5 | Freq: Four times a day (QID) | ORAL | Status: AC | PRN
Start: 2018-09-03 — End: 2018-09-03

## 2018-09-03 MED FILL — DEXAMETHASONE 4 MG TABLET: 4 4 MG | ORAL | Qty: 1

## 2018-09-03 MED FILL — FAMOTIDINE (PF) 20 MG/2 ML INTRAVENOUS SOLUTION: 20 20 mg/2 mL | INTRAVENOUS | Qty: 2

## 2018-09-03 MED FILL — DIPHENHYDRAMINE 25 MG CAPSULE: 25 25 mg | ORAL | Qty: 1

## 2018-09-03 MED FILL — PACLITAXEL 6 MG/ML CONCENTRATE,INTRAVENOUS: 6 6 mg/mL | INTRAVENOUS | Qty: 20

## 2018-09-03 NOTE — Nursing Note (Signed)
0815 am -  Port accessed per protocol without difficulty for infusion. Positive blood return noted. See flow sheet for details.

## 2018-09-03 NOTE — Nursing Note (Addendum)
C6, D8 Taxol infusion.  Patient only takes oral benadryl and declines iv benadryl.  She is asymptomatic with Hgb 8.4 - Dr. [name]: ok to treat.  Patient stated that, next week is my last treatment, if I have to do any more I don't think I will.  She inquired about when she can have her PAC removed, deferred for MD discussion at next visit.    Mg 1.3 - patient will take it orally, Dr. [name] calling in a prescription for 400 mg/3X's a day.      Patient left in stable condition with AVS and future appt.

## 2018-09-03 NOTE — Patient Instructions (Signed)
Call the office for:  Fever greater than 100.4.  If having chills, take your temperature.  Vomiting or diarrhea that lasts for 24 hours.  Constipation that last for 3 days.  Inability to drink or keep down 2 liters of fluid a day.  Unusual bruising or bleeding.  Any signs or symptoms of infection (ex: chills, sore throat, ear ache, cough, redness/warmth/tenderness of any skin areas, pain/burning when urinating).    Wash hands and use hand sanitizer.

## 2018-09-09 NOTE — Telephone Encounter (Signed)
Pt called in to report that she believes there is a tiny opening nearby her port. She stated she thinks she can see a suture in the opening. Nothing draining from it. Denies any fevers. Offered patient to come in today for an evaluation but pt declines and would like to come tomorrow right before her treatment. Pt scheduled for 8:30 am to have doctor look at it.

## 2018-09-10 ENCOUNTER — Ambulatory Visit: Admit: 2018-09-10 | Payer: Medicare (Managed Care)

## 2018-09-10 ENCOUNTER — Other Ambulatory Visit: Admit: 2018-09-10 | Payer: Medicare (Managed Care)

## 2018-09-10 DIAGNOSIS — C541 Malignant neoplasm of endometrium: Secondary | ICD-10-CM

## 2018-09-10 DIAGNOSIS — C563 Malignant neoplasm of bilateral ovaries (CMS-HCC): Secondary | ICD-10-CM

## 2018-09-10 DIAGNOSIS — Z5111 Encounter for antineoplastic chemotherapy: Secondary | ICD-10-CM

## 2018-09-10 LAB — CBC
Hematocrit: 23.4 % — ABNORMAL LOW (ref 35.0–45.0)
Hemoglobin: 8.3 g/dL — ABNORMAL LOW (ref 11.7–15.5)
MCH: 37.1 pg — ABNORMAL HIGH (ref 27.0–33.0)
MCHC: 35.3 g/dL (ref 32.0–36.0)
MCV: 105.3 fL — ABNORMAL HIGH (ref 80.0–100.0)
MPV: 7.8 fL (ref 7.5–11.5)
Platelet Estimate: ADEQUATE
Platelets: 165 10E3/uL (ref 140–400)
RBC: 2.22 10E6/uL — ABNORMAL LOW (ref 3.80–5.10)
RDW: 20.7 % — ABNORMAL HIGH (ref 11.0–15.0)
WBC: 2.5 10E3/uL — ABNORMAL LOW (ref 3.8–10.8)

## 2018-09-10 LAB — DIFFERENTIAL
Basophils Absolute: 28 /uL (ref 0–200)
Basophils Relative: 1.1 % (ref 0.0–1.0)
Eosinophils Absolute: 15 /uL (ref 15–500)
Eosinophils Relative: 0.6 % (ref 0.0–8.0)
Lymphocytes Absolute: 893 /uL (ref 850–3900)
Lymphocytes Relative: 35.7 % (ref 15.0–45.0)
Monocytes Absolute: 303 /uL (ref 200–950)
Monocytes Relative: 12.1 % (ref 0.0–12.0)
Neutrophils Absolute: 1263 /uL (ref 1500–7800)
Neutrophils Relative: 50.5 % (ref 40.0–80.0)
nRBC: 0 /100 WBC (ref 0–0)

## 2018-09-10 LAB — COMPREHENSIVE METABOLIC PANEL
ALT: 16 U/L (ref 7–52)
AST: 23 U/L (ref 13–39)
Albumin: 4.2 g/dL (ref 3.5–5.7)
Alkaline Phosphatase: 54 U/L (ref 36–125)
Anion Gap: 7 mmol/L (ref 3–16)
BUN: 18 mg/dL (ref 7–25)
CO2: 28 mmol/L (ref 21–33)
Calcium: 9 mg/dL (ref 8.6–10.3)
Chloride: 102 mmol/L (ref 98–110)
Creatinine: 0.7 mg/dL (ref 0.60–1.30)
Glucose: 114 mg/dL (ref 70–100)
Osmolality, Calculated: 287 mOsm/kg (ref 278–305)
Potassium: 4 mmol/L (ref 3.5–5.3)
Sodium: 137 mmol/L (ref 133–146)
Total Bilirubin: 0.5 mg/dL (ref 0.0–1.5)
Total Protein: 6.3 g/dL (ref 6.4–8.9)
eGFR AA CKD-EPI: >90 See note.
eGFR NONAA CKD-EPI: 84 See note.

## 2018-09-10 LAB — MAGNESIUM: Magnesium: 1.7 mg/dL (ref 1.5–2.5)

## 2018-09-10 MED ORDER — albuterol (PROVENTIL;VENTOLIN;PROAIR) inhaler 1-2 puff
90 | RESPIRATORY_TRACT | Status: AC | PRN
Start: 2018-09-10 — End: 2018-09-10

## 2018-09-10 MED ORDER — sodium chloride flush 10 mL
Freq: Every day | INTRAMUSCULAR | Status: AC | PRN
Start: 2018-09-10 — End: 2018-09-10

## 2018-09-10 MED ORDER — PACLitaxel (TAXOL) 120 mg in sodium chloride 0.9 % 250 mL chemo infusion
6 | Freq: Once | INTRAVENOUS | Status: AC
Start: 2018-09-10 — End: 2018-09-10
  Administered 2018-09-10: 15:00:00 120 mg/m2 via INTRAVENOUS

## 2018-09-10 MED ORDER — EPINEPHrine 1 mg/mL injection
1 | Freq: Every day | INTRAMUSCULAR | Status: AC | PRN
Start: 2018-09-10 — End: 2018-09-10

## 2018-09-10 MED ORDER — sodium chloride 0.9 % infusion
Freq: Every day | INTRAVENOUS | Status: AC | PRN
Start: 2018-09-10 — End: 2018-09-10

## 2018-09-10 MED ORDER — LORazepamATIVANtablet05mg
0.5 | Freq: Four times a day (QID) | ORAL | Status: AC | PRN
Start: 2018-09-10 — End: 2018-09-10

## 2018-09-10 MED ORDER — dexAMETHasone (DECADRON) tablet 4 mg
4 | Freq: Once | ORAL | Status: AC
Start: 2018-09-10 — End: 2018-09-10
  Administered 2018-09-10: 15:00:00 4 mg via ORAL

## 2018-09-10 MED ORDER — pegfilgrastim (NEULASTA Kit) delayed injection 6 mg
6 | Freq: Once | SUBCUTANEOUS | Status: AC
Start: 2018-09-10 — End: 2018-09-10
  Administered 2018-09-10: 16:00:00 6 mg via SUBCUTANEOUS

## 2018-09-10 MED ORDER — diphenhydrAMINE (BENADRYL) injection 25 mg
50 | Freq: Every day | INTRAMUSCULAR | Status: AC | PRN
Start: 2018-09-10 — End: 2018-09-10

## 2018-09-10 MED ORDER — famotidine (PF) (PEPCID) injection 20 mg
20 | Freq: Once | INTRAVENOUS | Status: AC
Start: 2018-09-10 — End: 2018-09-10

## 2018-09-10 MED ORDER — diphenhydrAMINE (BENADRYL) injection 25 mg
50 | Freq: Once | INTRAMUSCULAR | Status: AC
Start: 2018-09-10 — End: 2018-09-10

## 2018-09-10 MED ORDER — diphenhydrAMINE (BENADRYL) capsule 25 mg
25 | Freq: Once | ORAL | Status: AC | PRN
Start: 2018-09-10 — End: 2018-09-10
  Administered 2018-09-10: 15:00:00 25 mg via ORAL

## 2018-09-10 MED ORDER — hydrocortisone sod succ (PF)(SOLU-CORTEF) 100 mg injection
100 | Freq: Every day | INTRAMUSCULAR | Status: AC | PRN
Start: 2018-09-10 — End: 2018-09-10

## 2018-09-10 MED ORDER — heparin lock flush Syrg 500 Units
100 | Freq: Every day | INTRAVENOUS | Status: AC | PRN
Start: 2018-09-10 — End: 2018-09-10
  Administered 2018-09-10: 17:00:00 500 [IU]

## 2018-09-10 MED ORDER — famotidine (PEPCID) tablet 20 mg
20 | Freq: Once | ORAL | Status: AC | PRN
Start: 2018-09-10 — End: 2018-09-10
  Administered 2018-09-10: 15:00:00 20 mg via ORAL

## 2018-09-10 MED FILL — PACLITAXEL 6 MG/ML CONCENTRATE,INTRAVENOUS: 6 6 mg/mL | INTRAVENOUS | Qty: 20

## 2018-09-10 MED FILL — DIPHENHYDRAMINE 25 MG CAPSULE: 25 25 mg | ORAL | Qty: 1

## 2018-09-10 MED FILL — DEXAMETHASONE 4 MG TABLET: 4 4 MG | ORAL | Qty: 1

## 2018-09-10 MED FILL — FAMOTIDINE 20 MG TABLET: 20 20 MG | ORAL | Qty: 1

## 2018-09-10 MED FILL — NEULASTA ONPRO 6 MG/0.6 ML WITH WEARABLE SUBCUTANEOUS INJECTOR: 6 6 mg/0.6 mL | SUBCUTANEOUS | Qty: 0.6

## 2018-09-10 NOTE — Unmapped (Signed)
Lab draw from Venous Access Device

## 2018-09-10 NOTE — Unmapped (Signed)
[facility] arrives for taxol after md visit.  Labs reviewed, denies any new issues. Fall assessment completed, pt is low risk, oriented to room and call light given.See MD note for vitals, allergies, and medications.  Pt tolerated treatment without difficulty, AVS given.  Reviewed neulasta pod instructions, reminder card given. Pt left ambulatory.

## 2018-09-10 NOTE — Unmapped (Signed)
Patient stopped by to have her port looked at. One small area of a scab popped up on the top of the port. She thinks maybe a suture is popping through.     BP 116/54 (BP Location: Right arm)    Pulse 79    Temp 97 ??F (36.1 ??C) (Temporal)    Resp 15    Ht 5' 4 (1.626 m)    Wt 124 lb 9.6 oz (56.5 kg)    SpO2 99%    BMI 21.39 kg/m??     Exam:  Port without erythema or fluctuance. Small area of a scab on the top of the port, no suture material present, on one of the small three prongs.     Will have port RNs take a look. I don't think it looks infected, but defer to them whether we use it today. If not- we can use a PIV- this is hopefully her last treatment. We have follow up scheduled.       Maggie Font, MD, FACOG  Assistant Professor, Obstetrics and Gynecology  Division of Gynecologic Oncology  [phone]

## 2018-09-13 NOTE — Progress Notes (Signed)
Completed peer to peer for CT chest that was denied through [place].  Requested expedited appeal as pt is scheduled for CT Abdomen tomorrow. Appeal number [phone].  Fax #[phone].  Approval will be notified per [facility] representative 24-72 from today.  Call closed.

## 2018-09-14 ENCOUNTER — Ambulatory Visit: Payer: Medicare (Managed Care)

## 2018-09-14 NOTE — Telephone Encounter (Signed)
Patient called today to report that the scab that she has over her port is not a scab and it is draining serosanginous fluid. Pt states she believes she needs the port out. Also patient aware that her Chest CT has not yet been approved therefore she is requesting to cancel her CT's until her chest has been approved. Informed patient that I would follow up with Dr. [name] and get back in touch with her. Pt stated understanding.

## 2018-09-14 NOTE — Telephone Encounter (Signed)
Dr. [name] notified and stated ok to have port removed. Order placed and patient notified that IR will be contacting her to have the Port removed.

## 2018-09-14 NOTE — Telephone Encounter (Signed)
Called pt to reschedule CT.

## 2018-09-14 NOTE — Telephone Encounter (Signed)
Pt called back again to discuss the port. Informed the patient that I have contacted IR to have the port removed and they will contact her to get it scheduled to get removed. Pt concerned that it drained a little clear fluid today. Pt denies any fever, redness around the site or any warmth around the port. Advised the patient that we will can certainly see her again today to assess it or if any symptoms of infection occurred. Pt stated she doesn't feel as though it needs to be evaluated but she will call us if it does. Reviewed with her again the s/s of infection. Pt stated understanding.

## 2018-09-15 NOTE — Unmapped (Signed)
IR procedure (Port Removal) 09/20/18 at 11am with arrival at 10am. Called & left message. Described where to come & park. NPO. May take meds with sip of water. Will need driver. Left contact information.

## 2018-09-17 ENCOUNTER — Ambulatory Visit: Payer: Medicare (Managed Care)

## 2018-09-20 ENCOUNTER — Inpatient Hospital Stay: Admit: 2018-09-20 | Payer: Medicare (Managed Care) | Attending: Diagnostic Radiology

## 2018-09-20 DIAGNOSIS — Z452 Encounter for adjustment and management of vascular access device: Secondary | ICD-10-CM

## 2018-09-20 MED ORDER — midazolam (PF) (VERSED) injection
1 | INTRAMUSCULAR | Status: AC | PRN
Start: 2018-09-20 — End: 2018-09-20
  Administered 2018-09-20: 16:00:00 1 via INTRAVENOUS

## 2018-09-20 MED ORDER — lidocaine 10 mg/mL (1 %) injection
10 | INTRAMUSCULAR | Status: AC | PRN
Start: 2018-09-20 — End: 2018-09-20
  Administered 2018-09-20: 16:00:00 10 via SUBCUTANEOUS

## 2018-09-20 MED ORDER — sodium chloride flush 10 mL
INTRAMUSCULAR | Status: AC
Start: 2018-09-20 — End: 2018-09-21

## 2018-09-20 MED ORDER — midazolam (PF) (VERSED) 5 mg/mL injection
5 | INTRAMUSCULAR | Status: AC
Start: 2018-09-20 — End: 2018-09-20

## 2018-09-20 MED ORDER — fentaNYL (SUBLIMAZE) injection
50 | INTRAMUSCULAR | Status: AC | PRN
Start: 2018-09-20 — End: 2018-09-20
  Administered 2018-09-20: 16:00:00 50 via INTRAVENOUS

## 2018-09-20 MED ORDER — fentaNYL (SUBLIMAZE) 50 mcg/mL injection
50 | INTRAMUSCULAR | Status: AC
Start: 2018-09-20 — End: 2018-09-20

## 2018-09-20 MED ORDER — sodium chloride 0.9 % infusion
INTRAVENOUS | Status: AC
Start: 2018-09-20 — End: 2018-09-21

## 2018-09-20 MED FILL — MIDAZOLAM (PF) 5 MG/ML INJECTION SOLUTION: 5 5 mg/mL | INTRAMUSCULAR | Qty: 1

## 2018-09-20 MED FILL — FENTANYL (PF) 50 MCG/ML INJECTION SOLUTION: 50 50 mcg/mL | INTRAMUSCULAR | Qty: 2

## 2018-09-20 NOTE — Unmapped (Signed)
Interventional Radiology Post-Procedure Note       Date: 09/20/2018  Patient: Kelsey Sullivan  DOB: [birth year]  MRN: [id number]    IR Procedure(s) Performed:   Port removal    Operators:  Alda Lea, DO, Radiology Resident  Sinclair Ship, DO, Interventional radiology attending physician    Pre-operative diagnosis:   Skin wound superficial to port    Post-operative diagnosis:  Same    Intra-procedural Medications:    Medications   Medication Event Details Admin User Admin Time   midazolam (PF) (VERSED) injection Medication Ordered and Given Dose: 1 mg; Route: Intravenous; Ordered by: Cordella Register, RN 09/20/2018 11:50 AM   fentaNYL (SUBLIMAZE) injection Medication Ordered and Given Dose: 50 mcg; Route: Intravenous; Ordered by: Cordella Register, RN 09/20/2018 11:50 AM   lidocaine 10 mg/mL (1 %) injection Medication Ordered and Given Dose: 10 mL; Route: Subcutaneous; Site: Other; Comment: right side of chest; Ordered by: Cordella Register, RN 09/20/2018 11:51 AM       Findings:  Technically successful removal of port     Specimens removed:  Bard 6Fr Slim Port    Estimated Blood Loss:  Minimal (less than 15mL)    Complications:  None    Recommendations/Follow-up:  Patient to remain in IR until recovered from sedation.   Consult IR as needed.    Please refer to full dictated Interventional Radiology report for details of findings/procedures, which can be located in [facility] Procedures (or [facility]) Tabs.    IR procedure was reviewed with Sinclair Ship, DO    Please call with any questions.    Alda Lea, DO  Interventional Radiology  09/20/2018, 12:23 PM  [facility] IR Desk: [phone]  [facility] IR: [phone]  IR On-Call (after-hours, nights, wkds, holidays): [phone]

## 2018-09-20 NOTE — Unmapped (Signed)
We value your decision to have Interventional Radiology as part of your care team. We want to provide you with the best care possible. Thank you for giving Interventional Radiology the opportunity to care for you and your family at this time.  If you have any questions or concerns, please call:     Monday through Friday 8:00 am - 5:00 pm: [phone]   Evenings, Weekends, & Holidays:   [phone] (if no answer call [phone])   Anytime [facility]): [phone]          Care after a Port Removal    Post Procedure Instructions:     Do Not drive, exercise or do any strenuous activities today.    Do Not make any legal decisions today.    The area may be sore for 5-7 days.  You may take Tylenol or Ibuprofen for tenderness or pain at port site. Apply cold compresses as needed.     Do not remove/scratch the dermabond (glue) on the incision. It will fall off in about 7-10 days.     Remove dressing in 2 days.       Please call if:    ?? You have bleeding, swelling or drainage at the port removal site.  You have a fever greater than 100.5 or chills.    Procedural Sedation, Care After  Refer to this sheet in the next 24 hours. These instructions provide you with information on caring for yourself after your procedure. Your caregiver may also give you more specific instructions. Your treatment has been planned according to current medical practices, but problems sometimes occur. Call your caregiver if you have any problems or questions after your procedure.    WHAT IS PROCEDURAL SEDATION:  This type of sedation induces an altered level of consciousness that minimizes pain and discomfort through the use of pain relievers and sedatives. Patients who receive procedural sedation usually are able to speak and respond to verbal cues throughout the procedure. A brief period of amnesia may erase any memory of the procedure.    HOME CARE INSTRUCTIONS:  ??? Do not participate in any activities that require you to be alert or  coordinated.  ??? Do not:  o Drive  o [employer]  o Ride a bicycle  o Operate heavy machinery  o Adriana Simas  o Use power tools  o Dentist  o Work at [employer]  o Take a bath  ??? Do not drink alcohol  ??? Do not make any important decisions or sign legal documents.  ??? Stay with an adult  ??? The first meal following your procedure should be light and small. Avoid solid foods if you feel sick to your stomach (nauseous) or if you throw up (vomit).  ??? Drink enough fluids to keep your urine clear or pale yellow.  ??? Only take your usual medicines or new medicines if your caregiver approves them.  ??? Only take over-the-counter or prescription medicines for pain, discomfort, or fever as directed by your caregiver.  ??? Keep all follow-up appointments as directed by your caregiver.    SEEK IMMEDIATE MEDICAL CARE IF:  ??? You are not feeling normal or behaving normally after 24 hours.  ??? You have persistent nausea and vomiting.  ??? You are unable to drink fluids or eat food.  ??? You have difficulty urinating.  ??? You have difficulty waking or cannot be woken up.  ??? You develop a rash.   ??? You have difficulty breathing or develop chest pain.

## 2018-09-20 NOTE — Unmapped (Signed)
Interventional Radiology Pre-Procedure History and Physical       Date: 09/20/2018  Patient: Kelsey Sullivan  DOB: 15-Apr-1941  MRN: [id number]    Primary Care Provider: Salina April, MD  Requesting Provider: [name] MD  Reason for Consult: completed chemotherapy, skin opening over port with serosanguinous drainage    HPI: Kelsey Sullivan is a 77 y.o. female presenting to Interventional Radiology for [facility] removal.     History:  Past Medical History:   Diagnosis Date   ??? Bowel obstruction (CMS Dx) 2018   ??? Ovarian cancer (CMS Dx) 2014     Past Surgical History:   Procedure Laterality Date   ??? ABDOMINOPLASTY  1999   ??? BLEPHAROPLASTY  2000   ??? DILATION AND CURETTAGE OF UTERUS  1979   ??? ELBOW SURGERY  1990   ??? EYE SURGERY  1994   ??? KNEE ARTHROPLASTY Right    ??? ROTATOR CUFF REPAIR     ??? TRANSUMBILICAL AUGMENTATION MAMMAPLASTY     ??? VAGINAL HYSTERECTOMY  1979     History reviewed. No pertinent family history.    Social Hx:  Social History     Tobacco Use   Smoking Status Former Smoker   Smokeless Tobacco Never Used     Social History     Substance and Sexual Activity   Drug Use No     Social History     Substance and Sexual Activity   Alcohol Use Yes    Comment: 2-3 drinks a day       Allergies:   Allergies   Allergen Reactions   ??? Bactrim [Sulfamethoxazole-Trimethoprim] Other (See Comments)     Hallucinations, anxiety   ??? Nsaids (Non-Steroidal Anti-Inflammatory Drug)      Stomach ache   ??? Celecoxib Nausea And Vomiting   ??? Metoclopramide Other (See Comments)     Does not remember       Home Medications:  Prior to Admission medications    Medication   ascorbic acid, vitamin C, (VITAMIN C) 1000 MG tablet   dexamethasone (DECADRON) 4 MG tablet   digestive enzymes Cap   ergocalciferol, vitamin D2, (VITAMIN D ORAL)   GLUTATHIONE-L ORAL   ibuprofen (ADVIL,MOTRIN) 200 MG tablet   ibuprofen-diphenhydramine HCl 200-25 mg Cap   Lactobac no.41/Bifidobact no.7 (PROBIOTIC-10 ORAL)   lidocaine-prilocaine (EMLA) cream    magnesium oxide (MAG-OX) 400 mg tablet   MAGNESIUM SULFATE TOP   omeprazole (PRILOSEC) 20 MG capsule   ondansetron (ZOFRAN) 4 MG tablet   proCHLORPERazine (COMPAZINE) 10 MG tablet   vit b complex w-b 12 (B COMPLEX-VITAMIN B12) tablet   vitamin E 100 UNIT capsule       ROS:   Negative General: Denies fever, chills or weakness  Cardiovascular: Denies chest pain, chest pressure or palpitations  Respiratory: Denies shortness of breath, wheezing or cough  GI: Denies abdominal pain or n/v/d  GU: Denies pain with urination or blood in urine  Skin: black spot at skin over port      Vitals:   Vitals:    09/20/18 1108   BP: 121/63   BP Location: Right arm   Patient Position: Lying   Resp: 16   Temp: 97.5 ??F (36.4 ??C)   TempSrc: Oral   SpO2: 99%       PE:  Gen: Awake, alert, no apparent distress, as stated age  HEENT: Normocephalic, atraumatic, EOMI, mucous membranes pink and moist  Neck: Supple, symmetrical, trachea midline and mobile  Chest: component  of port extending through open superficial defect.  GU: deferred    Labs:  Recent Labs     08/27/18  0855 09/03/18  0815 09/10/18  0905   WBC 6.2 4.4 2.5*   HGB 9.2* 8.4* 8.3*   HCT 26.1* 24.1* 23.4*   MCV 103.9* 104.2* 105.3*   PLT 144 266 165     Recent Labs     04/12/18  0915   INR 1.0   PROTIME 13.4     Recent Labs     08/27/18  0855 09/03/18  0815 09/10/18  0905   CREATININE 0.59* 0.56* 0.70     Recent Labs     08/27/18  0855 09/03/18  0815 09/10/18  0905   ALT 16 18 16    AST 20 18 23    ALKPHOS 77 71 54   BILITOT 0.4 0.4 0.5       Imaging:  No results found for this or any previous visit (from the past 336 hours). and No results found for this or any previous visit (from the past 720 hours).    I personally reviewed the relevant findings from the above imaging results.    Pre-Sedation Evaluation:      Airway:     Mallampati::  III    Mouth Opening::  >2 FB    TM distance::  > = 3 FB    Neck ROM::  Full    ASA Score:     ASA:  II -patient with mild systemic disease with  no functional limitations    Sedation specific history concerns:   none of the above      This patient was reevaluated immediately prior to sedation administration.      Medical Decision Making       Assessment:  Kelsey Sullivan is a 77 y.o. 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     Plan:  1. Will proceed with Port removal  2.Moderate Sedation/Intravenous Fentanyl & Versed as needed, local     The procedure has been explained. Post-procedure care instructions & education have been provided. All questions were answered.     Consent with alternatives to the procedure, risks, and benefits have been explained and discussed with the patient/patient's family and signed informed consent obtained. Patient/patient's family agreeable with plan of care.     Consent obtained and in IR    Alda Lea, DO  Interventional Radiology  09/20/2018, 11:17 AM  [facility] IR Desk: [phone]  [facility] IR: [phone]  IR On-Call (after-hours, nights, wkds, holidays): [phone]

## 2018-09-21 NOTE — Unmapped (Signed)
Follow up after IR Procedure 09/20/18. Called & left message. Asked how she is feeling? Pain? Fever? Able to eat & drink? May remove dressing 09/22/18. Left contact information for return call.

## 2018-09-22 ENCOUNTER — Inpatient Hospital Stay: Admit: 2018-09-22 | Payer: Medicare (Managed Care)

## 2018-09-22 DIAGNOSIS — C569 Malignant neoplasm of unspecified ovary: Secondary | ICD-10-CM

## 2018-09-22 MED ORDER — OMNIPAQUE (iohexol) 350 mg iodine/mL 150 mL
350 | Freq: Once | INTRAVENOUS | Status: AC | PRN
Start: 2018-09-22 — End: 2018-09-22
  Administered 2018-09-22: 14:00:00 125 mL via INTRAVENOUS

## 2018-09-22 MED ORDER — OMNIPAQUE (iohexol) 240 mg iodine/mL 50 mL
240 | Freq: Once | INTRAVENOUS | Status: AC | PRN
Start: 2018-09-22 — End: 2018-09-22
  Administered 2018-09-22: 14:00:00 50 mL via ORAL

## 2018-09-25 NOTE — Unmapped (Signed)
This is a notification of an ED/Admission Alert generated by [facility].     This patient visited the following location: [facility] ([facility])  Admit Date: 09/25/2018 08:10  Visit Type: Emergency  Chief complaint: Cat bite to hand  Diagnosis: Cat bite to hand ()  Alert Category:   Attending Physician:   Referring Physician:   Consulting Physician:   Copied Physician(s):     [facility] is a not-for-profit corporation that was founded in 1997 as a   community effort to enhance the ability to share health information   electronically in the [place] area. Today, [facility]   is one of the nation????????s largest and most financially sustainable regional   health information exchange (HIE) organizations.

## 2018-09-25 NOTE — Unmapped (Signed)
This is a notification of a Discharge Alert generated by [facility].     This patient visited the following location: [facility] ([facility])  Admit Date: 09/25/2018 08:10  Discharge Date: 09/25/2018 09:33  Visit Type: Emergency  Chief complaint: Open bite of left hand, initial encounte; Bitten by cat, initial encounter; Encounter for immunization  Diagnosis: Open bite of left hand, initial encounter; Bitten by cat, initial encounter; Encounter for immunization (Z61.096E; W55.85XA; Z23)  Alert Category:   Attending Physician:   Referring Physician:   Consulting Physician: Bridgette Habermann  Copied Physician(s):     [facility] is a not-for-profit corporation that was founded in 1997 as a   community effort to enhance the ability to share health information   electronically in the [place] area. Today, [facility]   is one of the nation????????s largest and most financially sustainable regional   health information exchange (HIE) organizations.

## 2018-09-26 NOTE — Unmapped (Signed)
This is a notification of an ED/Admission Alert generated by [facility].     This patient visited the following location: [facility] ([facility])  Admit Date: 09/26/2018 20:59  Visit Type: Emergency  Chief complaint: Left hand redness has spread from a cat   Diagnosis: Left hand redness has spread from a cat bite ()  Alert Category:   Attending Physician:   Referring Physician:   Consulting Physician:   Copied Physician(s):     [facility] is a not-for-profit corporation that was founded in 1997 as a   community effort to enhance the ability to share health information   electronically in the [place] area. Today, [facility]   is one of the nation????????s largest and most financially sustainable regional   health information exchange (HIE) organizations.

## 2018-09-26 NOTE — Unmapped (Signed)
This is a notification of an ED/Admission Alert generated by [facility].     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; Adverse effect of antineoplastic and   imm  Diagnosis: Open bite of left hand, initial encounter; Local infection of the skin and subcutaneous tissue, unspecified; Bitten by cat, initial encounter; Other long term (current) drug therapy; Anemia due to antineoplastic chemotherapy; Adverse effect of   antineoplastic and immunosuppressive drugs, initial enc (S61.452A; L08.9; W55.01XA; Z61.096; E45.40; J81.1B1Y)  Alert Category:   Attending Physician: Hazle Nordmann  Referring Physician:   Consulting Physician:   Copied Physician(s):     [facility] is a not-for-profit corporation that was founded in 1997 as a   community effort to enhance the ability to share health information   electronically in the [place] area. Today, [facility]   is one of the nation????????s largest and most financially sustainable regional   health information exchange (HIE) organizations.

## 2018-09-27 NOTE — Telephone Encounter (Signed)
Patient is currently admitted to [facility] due to a cat bite. Staff have informed her that the earliest she will be discharged will be on Wed. Patient is not sure what time on Wed, so she wanted to cancel her appt with Dr. [name] and reschedule. Patient would like earliest next appt with Dr. Need to verify with Dr when would be the next best time for patient appt. Provider schedule currently shows next availability at 10/21/18. Patient stated she needs to be seen before then.

## 2018-09-27 NOTE — Telephone Encounter (Signed)
Pt called back about her appt with Dr. Leonard Schwartz, let her know that she would have to be seen in [facility] next week if she was unable to make it to her appt on Wednesday. Pt understood and was transferred to the [facility] office.

## 2018-09-29 ENCOUNTER — Ambulatory Visit: Payer: Medicare (Managed Care)

## 2018-09-29 NOTE — Unmapped (Signed)
This is a notification of a Discharge Alert generated by [facility].     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; Adverse effect of antineoplastic and   imm  Diagnosis: Open bite of left hand, initial encounter; Local infection of the skin and subcutaneous tissue, unspecified; Bitten by cat, initial encounter; Other long term (current) drug therapy; Anemia due to antineoplastic chemotherapy; Adverse effect of   antineoplastic and immunosuppressive drugs, initial enc (S61.452A; L08.9; W55.8XA; W29.562; Z30.86; V78.4O9G)  Alert Category:   Attending Physician: Bridgette Habermann  Referring Physician:   Consulting Physician: Shade Flood, RAVINDHAR  Copied Physician(s):     [facility] is a not-for-profit corporation that was founded in 1997 as a   community effort to enhance the ability to share health information   electronically in the [place] area. Today, [facility]   is one of the nation????????s largest and most financially sustainable regional   health information exchange (HIE) organizations.

## 2018-10-05 ENCOUNTER — Ambulatory Visit: Admit: 2018-10-05 | Discharge: 2018-10-05 | Payer: Medicare (Managed Care)

## 2018-10-05 DIAGNOSIS — C561 Malignant neoplasm of right ovary: Secondary | ICD-10-CM

## 2018-10-05 MED ORDER — rucaparib (RUBRACA) 300 mg Tab
300 | ORAL_TABLET | Freq: Two times a day (BID) | ORAL | 11 refills | Status: AC
Start: 2018-10-05 — End: 2018-12-10

## 2018-10-05 NOTE — Unmapped (Signed)
History of Present Illness  Kelsey Sullivan is a 77 y.o. female with   Chief Complaint   Patient presents with   ??? 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 She declined adjuvant therapy. She used natural methods, went to a raw diet for a year, etc.  She developed a small bowel obstruction in 03/2017, resolved on its own, with NED at that time.  In 01/2018, she experienced mid abdominal pain, and seen by PCP and ED, with CT of abdomen and pelvis showing interval development of necrotic retroperitoneal lymphadenopathy with largest node measuring 4.9  4.2 x 4.8 cm. Her CA 125 on 02/18/18 was 197.8.  She underwent a ct-guided nodal biopsy on 02/26/18, with pathology confirming recurrent disease.  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 She was seen at [facility] cancer in [place] with Dr. Ishmael Holter. She completed 6 cycles of chemotherapy with Carboplatin and Paclitaxel-weekly (C6D1 was 08/27/18). CA 125 was 8.7 at C6.     She underwent CT CAP on 10/16/2019n revealed ned in chest and interval decrease in size of a left periaortic necrotic lymph node from 1.8 to 1.6 cm. 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       She is seen today for  follow up and scan review. [facility]. She had her port removed as well- she doesn't like the scar.   Kelsey Sullivan is doing well. She is tired, but getting better   She denies any bloating or fullness.  She reports normal bowel/bladder habits.   She denies any GU concerns. Denies any dysuria, frequency, hematuria, flank pain or fevers.  She denies any vaginal bleeding or discharge.   She has mild neuropathy.   She reports no leg swelling.   Her appetite is good, and reports no nausea or vomiting. Tolerating solid foods.      Patient Active Problem List    Diagnosis   ??? Small bowel obstruction (CMS Dx)   ??? H/O small bowel obstruction   ??? 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 Pt declined adjuvant therapy      R ovary and fallopain tube-  Serous adenocarcinoma, high grade,  arising from the fallopian tube, infarcted consistent with torsion. Serous carcionma in situ.       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     Posterior cul de sac biopsy-metastatic adenocarcinoma.     Omentectomy- metastatic adenocarcinoma     LND-negative     Residual right IP ligament, Anterior cul de sac biopsy, right pelvic side will biopsy, left pelvic side wall, right gutter and left gutter-->benign    04/06/2017:   CT [name]- mild small bowel obstruction with transition point in the left pelvis adjacent to the anterior abdominal wall. Low density in the common femoral veins bilaterally thought likely to represent flow artificat, may consider follow up with lomer extremity Doppler to  ensure there is no underlying deep venous thrombosis.    01/29/2018:   CTAP:  Interval development of necrotic retroperitoneal lymphadenopathy with largest node measuring 4.9  4.2 x 4.8 cm. Origin is indeterminate.  Options would include CT directed biopsy as well as PET/CT.    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  No other focal areas of FD avid malignancy are found. Specifically, no lymph node activity is found outside of the retroperitoneum and there is no evidence for FDG avid pulmonary, skeletal or hepatic parenchymal metastasis.    02/2018:   Patient saw Dr. Ishmael Holter who recommended Carbo/Taxol with an AUC of 6.  Dr. Maren Reamer office submitted [facility] one testing. Patient would like Genetic Testing    04/12/2018:   IR port placed    04/16/2018:   C1 weekly taxol 80 mg/m2 D1,8,15, Carboplatin AUC 6 D1 q 21 days. PCEs on thurs, txt on fridays. EXAMS ON EVEN CYCLES     CA 125: 523.4     Genetic testing: Neg for CS mutation, no VUS, lifetime remaining breast cancer risk -3.4%    05/07/2018:   C2 weekly taxol 80 mg/m2 D1,8,15, Carboplatin AUC 6 D1 q 21 days. PCEs on thurs, txt on fridays. EXAMS ON EVEN CYCLES     CA 125: 162.2    05/20/2018:   Early PCE due to scheduling, due 6/21    05/28/2018  Delay C3 due to low ANC     CA 125: 25    06/04/2018  C3 weekly taxol 80 mg/m2 D1,8,15, Carboplatin AUC 6 D1 q 21 days. EXAMS ON EVEN CYCLES.      CA 125    06/25/2018:   HOLD chemo due to low anc (1047)- defer to next week, and change to q 28 days cycle to build in an off week to allow for  counts to recover     CA 125: 10.6    07/02/2018:   C4 weekly taxol 80 mg/m2 D1,8,15, (missed day 5 due to admission) Carboplatin AUC 6 D1 q 28 days. ANC still lowish 1480s- neulasta Day 16      EXAMS ON EVEN CYCLES.  Review her genetics testing results      CA 125 7.2    07/07/2018:   [facility] admission: SBO, neutropenic fever  CTAP:Small bowel obstruction with transition point in the anterior mid abdomen near the midline. Small amount of free fluid in the abdomen, which is nonspecific. 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     Peritoneum: Small amount of free fluid in the abdomen. A para-aortic density with central low attenuation measures 2.5 x 2 cm and is significantly decreased in size from prior.    07/16/2018:   CA 125: 28.5    07/30/2018:  C5 weekly taxol 80 mg/m2 D1,8,15, Carboplatin AUC 6 D1 q 28 days. Neulasta Day 16      EXAMS ON EVEN CYCLES.      CA 125: 11.4    08/27/2018:   C6 weekly taxol 80 mg/m2 D1,8,15, Carboplatin AUC 6 D1 q 28 days. Neulasta Day 16. Order Ct CAP. RTC 10/11 (3 weeks)     EXAMS ON EVEN CYCLES. Discussed option of maintenance parp I- pt not decided (doesn't want to feel tired anymore)     CA 125: 8.7    09/20/2018:   Port removed: While the skin surface appeared inflamed, there was no evidence of infection in the port reservoir.    09/22/2018:   CT Chest: No evidence of thoracic metastatic disease.     Suspected small seroma in the base of the right neck. ??Necrotic metastatic lesion could also have this appearance but considered less likely.     CtAP: Interval decrease in size of a left periaortic necrotic lymph node. No evidence of new metastatic disease.     Lymphatics: Previously demonstrated left para-aortic lymph node with central necrosis currently measures 1.2 cm on series 5 image 45, previously measured 1.8 cm in the short axis.    10/05/2018:   Reviewed scans- aortic node has decreased in size  uncertain if active cancer or just scarring. Check PET (to investigate of node is hypermetabolic as well as assess for liver implant previously noted on pet. Offered maintenance with parp I- rubraca 600 mg BID. Pt interested-order placed thru [facility]. RTC BC in a few weeks to start drug and review PET     Disposition: chemo, imaging after 3 cucles, CA 125, possible parp maintenance after, genetic testing neg, foundation one testing results from [facility] in [place]- this was not performed   Current disease status: Neg for CS mutation, no VUS, lifetime remaining breast cancer risk -3.4%  Genetics: negative for mutations, breast cancer lifetime risk 3.4%  Survivorship plan: pending completion of treatment              Past Medical History  She  has a past medical history of Bowel obstruction (CMS Dx) (2018) and Ovarian cancer (CMS Dx) (2014).  Past Medical History:   Diagnosis Date   ??? Bowel obstruction (CMS Dx) 2018   ??? Ovarian cancer (CMS Dx) 2014       Past Surgical History  Past Surgical History:   Procedure Laterality Date   ??? ABDOMINOPLASTY  1999   ??? BLEPHAROPLASTY  2000   ??? DILATION AND CURETTAGE OF UTERUS  1979   ??? ELBOW SURGERY  1990   ??? EYE SURGERY  1994   ??? KNEE ARTHROPLASTY Right    ??? ROTATOR CUFF REPAIR     ??? TRANSUMBILICAL AUGMENTATION MAMMAPLASTY     ??? VAGINAL HYSTERECTOMY  1979       Family History  History reviewed. No pertinent family history.    Family cancer history for ovarian, uterine and  colon cancer is negative other than above.       Social History  Social History     Socioeconomic History   ??? Marital status: Widowed     Spouse name: None   ??? Number of children: None   ??? Years of education: None   ??? Highest education level: None   Occupational History   ??? None   Social Needs   ??? Financial resource strain: None   ??? Food insecurity:     Worry: None     Inability: None   ??? Transportation needs:     Medical: None     Non-medical: None   Tobacco Use   ??? Smoking status: Former Smoker   ??? Smokeless  tobacco: Never Used   Substance and Sexual Activity   ??? Alcohol use: Yes     Comment: 2-3 drinks a day   ??? Drug use: No   ??? Sexual activity: None   Lifestyle   ??? Physical activity:     Days per week: None     Minutes per session: None   ??? Stress: None   Relationships   ??? Social connections:     Talks on phone: None     Gets together: None     Attends religious service: None     Active member of club or organization: None     Attends meetings of clubs or organizations: None     Relationship status: None   ??? Intimate partner violence:     Fear of current or ex partner: None     Emotionally abused: None     Physically abused: None     Forced sexual activity: None   Other Topics Concern   ??? Caffeine Use No   ??? Occupational Exposure No   ??? Exercise Yes   ??? Seat Belt Yes   Social History Narrative    Mammogram-6 years ago and it was normal. Pt stated she isn't getting them anymore.      Colonoscopy-10 years ago        Pt reports she can lie flat in bed without SOB.    Pt states she can walk a flight of stairs and city block without chest pain and SOB>         Past OB/GYN History  G3P2  She reports menarche at age 30 and menopause at age 69-surgical.  She denies a history of STIs.  She denies a history of abnormal cervical cytology and reports her last cytologic examination she is unsure of. She has taken HRT.     Health maintenance:  Mammogram: Date 6 years ago Results normal  Colonoscopy: Date 10 years ago Results normal    Allergies  She is allergic to bactrim [sulfamethoxazole-trimethoprim]; nsaids (non-steroidal anti-inflammatory drug); celecoxib; and metoclopramide.    BMI  Body mass index is 21.28 kg/m??.               Histories  She has a past medical history of Bowel obstruction (CMS Dx) (2018) and Ovarian cancer (CMS Dx) (2014).    She has a past surgical history that includes Dilation and curettage of uterus (1979); Vaginal hysterectomy (1979); Elbow surgery (1990); Eye surgery (1994); Abdominoplasty (1999);  Blepharoplasty (2000); Transumbilical augmentation mammaplasty; Rotator cuff repair; and Knee Arthroplasty (Right).    Her family history is not on file.    She reports that she has quit smoking. She has never used smokeless tobacco. She reports current alcohol use. She reports  that she does not use drugs.    Allergies  Bactrim [sulfamethoxazole-trimethoprim]; Nsaids (non-steroidal anti-inflammatory drug); Celecoxib; and Metoclopramide    Medications  Outpatient Encounter Medications as of 10/05/2018   Medication Sig Dispense Refill   ??? ascorbic acid, vitamin C, (VITAMIN C) 1000 MG tablet Take 1,000 mg by mouth daily.     ??? dexamethasone (DECADRON) 4 MG tablet Take 8 mg by mouth daily with food on day 2, 3, and 4 of chemotherapy cycle 18 tablet 6   ??? digestive enzymes Cap Take by mouth.     ??? ergocalciferol, vitamin D2, (VITAMIN D ORAL) Take 10,000 Units by mouth daily.            ??? GLUTATHIONE-L ORAL Take by mouth daily.     ??? ibuprofen (ADVIL,MOTRIN) 200 MG tablet Take 200-800 mg by mouth every 8 hours as needed for Pain.     ??? ibuprofen-diphenhydramine HCl 200-25 mg Cap Take 1 tablet by mouth at bedtime as needed.     ??? Lactobac no.41/Bifidobact no.7 (PROBIOTIC-10 ORAL) Take 1 capsule by mouth daily. [employer] brand     ??? lidocaine-prilocaine (EMLA) cream Apply topically daily as needed. Apply pea-size amount to port site 30-60 min prior to accessing port 30 g 1   ??? magnesium oxide (MAG-OX) 400 mg tablet Take 1 tablet (400 mg total) by mouth 3 times a day. Indications: hypomagnesemia 90 tablet 0   ??? MAGNESIUM SULFATE TOP Apply topically daily.     ??? omeprazole (PRILOSEC) 20 MG capsule Take 20 mg by mouth daily.            ??? ondansetron (ZOFRAN) 4 MG tablet Take 1 tablet (4 mg total) by mouth every 8 hours as needed for Nausea. Must wait to take until 5 days after receiving Aloxi 20 tablet 2   ??? proCHLORPERazine (COMPAZINE) 10 MG tablet Take 1 tablet (10 mg total) by mouth every 6 hours as needed (For nausea  and vomiting). 30 tablet 3   ??? rucaparib (RUBRACA) 300 mg Tab Take 2 tablets (600 mg) by mouth 2 times a day. 120 tablet 11   ??? vit b complex w-b 12 (B COMPLEX-VITAMIN B12) tablet Take 1 tablet by mouth daily.     ??? vitamin E 100 UNIT capsule Take 100 Units by mouth daily.       No facility-administered encounter medications on file as of 10/05/2018.         Review of Systems   Constitutional: Positive for fatigue. Negative for activity change, appetite change, chills, fever, weight gain and weight loss.   HENT: Negative for congestion, mouth sores, rhinorrhea, sinus pressure, sore throat and trouble swallowing.    Eyes: Negative for photophobia, redness, itching and visual disturbance.   Respiratory: Negative for apnea, cough, chest tightness and shortness of breath.    Cardiovascular: Negative for chest pain, palpitations and leg swelling.   Gastrointestinal: Negative for abdominal distention, abdominal pain, anal bleeding, bloating, blood in stool, constipation, diarrhea, nausea and vomiting.   Genitourinary: Negative for decreased urine volume, difficulty urinating, dysuria, frequency, genital sores, hematuria, pelvic pain, urgency, vaginal bleeding, vaginal discharge and vaginal pain.   Musculoskeletal: Negative for arthralgias, back pain, joint swelling and neck pain.   Skin: Positive for wound. Negative for color change and pallor.        Port site removal    Neurological: Positive for numbness. Negative for dizziness, seizures, weakness and headaches.   Hematological: Negative for adenopathy. Does not bruise/bleed  easily.   Psychiatric/Behavioral: Negative for agitation, confusion and depression. The patient is nervous/anxious.        Vitals  Blood pressure 131/84, pulse 88, height 5' 4 (1.626 m), weight 124 lb (56.2 kg), SpO2 100 %.    Physical Exam   Vitals reviewed.  Constitutional: She is oriented to person, place, and time. She appears well-developed and well-nourished.   HENT:   Head: Normocephalic  and atraumatic.   Eyes: Conjunctivae and EOM are normal.   Neck: Normal range of motion. Neck supple. No thyromegaly present.   Cardiovascular: Normal rate, regular rhythm and normal heart sounds.   Pulmonary/Chest: Effort normal and breath sounds normal. No respiratory distress. She has no wheezes. She has no rales.   Port site removal healing well   Abdominal: Soft. Bowel sounds are normal. She exhibits no distension and no mass. There is no tenderness. There is no rebound and no guarding.   No masses or tenderness.  No hernia palpable.   No hepatosplenomegaly.     Prior scars from xlap, tummy tuck   Genitourinary:    Genitourinary Comments: Deferred-   Exam 08/27/18 by CB    Normal external genitalia/vulva.  Normal urethral meatus, urethra, bladder, anus and perineum.  Normal vagina and vaginal cuff.  Uterus, cervix and adnexa surgically absent.  Normal rectum.  There is no nodularity in the posterior culdesac or rectovaginal septum.       Musculoskeletal: Normal range of motion.         General: No edema.     Lymphadenopathy:     She has no cervical adenopathy.   Neurological: She is alert and oriented to person, place, and time.   Skin: Skin is warm and dry.   Psychiatric: She has a normal mood and affect. Her behavior is normal. Judgment and thought content normal.          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    R ovary and fallopain tube-  Serous adenocarcinoma, high grade, arising from the fallopian tube, infarcted  consistent with torsion. Serous carcionma in situ.       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     Posterior cul de sac biopsy-metastatic adenocarcinoma.     Omentectomy- metastatic adenocarcinoma     LND-negative     Residual right IP ligament, Anterior cul de sac biopsy, right pelvic side will biopsy, left pelvic side wall, right gutter and left gutter-->benign    04/06/2017:   CT [name]- mild small bowel obstruction with transition point in the left pelvis adjacent to the anterior abdominal wall. Low density in the common femoral veins bilaterally thought likely to represent flow artificat, may consider follow up with lomer extremity Doppler to ensure there is no underlying deep venous  thrombosis.    01/29/2018:   CTAP:  Interval development of necrotic retroperitoneal lymphadenopathy with largest node measuring 4.9  4.2 x 4.8 cm. Origin is indeterminate.  Options would include CT directed biopsy as well as PET/CT.    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  No other focal areas of FD avid malignancy are found. Specifically, no lymph node activity is found outside of the retroperitoneum and there is no evidence for FDG avid pulmonary, skeletal or hepatic parenchymal metastasis.    Cancer Staging:  Cancer Staging  Malignant neoplasm of ovary (CMS Dx)  Staging form: Ovary, Fallopian Tube, And Primary Peritoneal Carcinoma, AJCC 8th Edition  - Clinical: FIGO Stage IIIC - Signed by Mikle Bosworth, MD on 04/01/2018                     Assessment & Plan  My impression is Ms. 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    She completed 6 cycles of chemotherapy with Carboplatin and Paclitaxel-weekly (C6D1 was 08/27/18). CA 125 was 8.7 at C6.     She underwent CT CAP on 10/16/2019n revealed ned in chest and interval decrease in size of a left periaortic necrotic lymph node from 1.8 to 1.6 cm. 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       We reviewed her scans today which show at least a partial response- the previously noted aortic node has decreased in size from 1.8 cm to 1.2 cm. It is uncertain if the node has active cancer or is just scarring. I recommend we check PET (to investigate of node is hypermetabolic as well as assess for liver implant previously noted on pet).   I offered maintenance with parp I- rubraca 600 mg BID. Pt interested-order placed thru [facility] pharmacy. RTC BC in a few weeks to start drug and review PET    Rationale and counseling for rubraca in PS rec ov ca after PR or CR:   For patients with platinum-sensitive relapsed ovarian cancer with a partial or complete response to platinum-based chemotherapy, the PARP inhibitors niraparib, olaparib, and rucaparib are approved by the FDA for maintenance therapy. While PFS outcomes are improved with these agents regardless of BRCA mutation status, data on OS outcomes are pending.    Rucaparib has shown efficacy both as maintenance therapy as well as monotherapy for recurrent, platinum-sensitive, high-grade ovarian carcinoma.     In the phase III ARIEL3 trial, 564 patients with relapsed high-grade serous or endometrioid ovarian carcinoma, all of whom had received ?2 previous  platinum-based therapies and achieved a complete or partial response to their most recent platinum-based treatment, were randomly assigned in a 2:1 ratio to maintenance therapy with rucaparib or to placebo . In contrast to other  studies of PARP inhibitors, women were permitted to enroll in this study even if they had residual bulky disease (?2 cm).   Rucaparib improved PFS in the intention-to-treat population (10.8 versus 5.4 months; HR 0.36, 95% CI 0.30-0.45) as well as among those with a known genomic or somatic BRCA mutation (16.6 versus 5.4 months; HR 0.23, 95% CI 0.16-0.34) and those with homologous recombination deficiencies (13.6 versus 5.4 months; HR 0.32, 95% CI 0.24-0.42). Among the 37 percent of patients with measurable disease at study entry, rucaparib led to responses in 20 percent. These data demonstrate efficacy of rucaparib as maintenance therapy in platinum-sensitive, relapsed ovarian cancer.  We reviewed the data to support this approval come from a pooled analysis of two phase II studies in which 106 patients with BRCA-mutated, high-grade ovarian cancer who had received at least two chemotherapy regimens were treated with rucaparib. In this population, the objective response rate (ORR) was 54 percent and duration of response was 9.2 months.     We reviewed the expected side effects and the safety of rucaparib, with common adverse reactions including nausea, fatigue, abdominal pain, dysgeusia, constipation, decreased appetite, diarrhea, thrombocytopenia, and dyspnea. Myelodysplastic syndrome/acute myeloid leukemia (MDS/AML) were reported in 2 of 377 patients (0.5 percent). Patients should be monitored for hematologic toxicity at baseline and monthly thereafter, and use of rucaparib should be discontinued if MDS/AML is confirmed.    After discussing the above she has elected to proceed with rucaparib 600 mg po BID (28 days cycles) until disease progression.   __RTC BC on 10/21/2018 prior to starting  for PCE#1 and labs  Supportive meds: plan to continue zofran prn, colace BID and pepcid.   Cycles are q 28 days, but will check labs (CBC, CMP)  q 14 days while on treatment and adjust dose based on symptoms and labs abnormalities as needed. Consider ritalin (5 mg BID) in future if fatigue is grade 3-4.      Genetics:  previously discussed her negative genetic testing results.      Pain  Pain Score: Zero (10/05/2018  2:21 PM)    She denies presence of pain.  Pain will be reassessed at next visit.               I have spent 30 minutes face to face with this patient with greater than 50% of this time spent in counseling and coordination of care discussing the above    Medical Decision Making:  The following items were considered in medical decision making:  Review / order clinical lab tests  Review / order radiology tests  Review / order other diagnostic tests/interventions      Maggie Font, MD, [facility]  Assistant Professor, Obstetrics and Gynecology  Division of Gynecologic Oncology  [phone]

## 2018-10-06 NOTE — Unmapped (Signed)
[facility] Note    Prescription received for: Rubraca (rucaparib)    Insurance rejection message: PA required    Insurance prior authorization process started via: CMM    Using EPIC chart as reference, provided requested information to insurance including:  diagnosis code and treatment history    Claim status: submitted.  Response expected in 24-72 hours.    Payton Emerald, PharmD, [facility]   Clinical Pharmacy Specialist  [facility]  [phone]

## 2018-10-06 NOTE — Unmapped (Signed)
[facility] Note    Prescription received for: Rubraca (rucaparib)    Insurance rejection message: PA required    Insurance prior authorization process started via: CMM    Using EPIC chart as reference, provided requested information to insurance including:  diagnosis code and treatment history    Claim status: approved through 12/08/2019 but with high copay. Obtained permission to pursue medication assistance.     Payton Emerald, PharmD, [facility]   Clinical Pharmacy Specialist  [facility]  [phone]

## 2018-10-07 NOTE — Unmapped (Signed)
Medication Access Services    Patient was referred to the Medication Access Services Department by Payton Emerald, [facility] Pharmacist, for assistance in obtaining Rubraca. The medication has a high copayment when billed through the patient's insurance.     Skeet Simmer spoke to the patient on 10/06/2018 regarding the medication and discussed the potential for assistance. Through her conversation, it was determined that the patient has a household size of 1 and an estimated annual household income of $24,000.     [facility] reviewed the provided information to determine if the patient may qualify for assistance in obtaining Rubraca. 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 A new application was generated using [employer] and sent to the patient's provider to be signed.     [facility] reached out to the patient to discuss the enrollment process. The patient was informed that she would need to sign the application and provide a valid proof of income to complete the enrollment process. Per patient's request, the [employer] HIPAA form and [employer] application will be mailed to her home address. The patient will sign the application and return it via mail or fax to the [facility]ication [facility] office with a copy of her 2018 1040 tax form.     Once the patient and provider portions of the application are received, the completed application will be faxed to the Rub[employer]a [employer] Patient Assistance Program for review.    [facility]  [facility] Financial Care Specialist  T: [phone]  F: [phone]

## 2018-10-15 ENCOUNTER — Inpatient Hospital Stay: Admit: 2018-10-15 | Discharge: 2018-10-20 | Payer: Medicare (Managed Care) | Attending: Gerontology

## 2018-10-15 ENCOUNTER — Inpatient Hospital Stay: Admit: 2018-10-15 | Payer: Medicare (Managed Care) | Attending: Gerontology

## 2018-10-15 DIAGNOSIS — C569 Malignant neoplasm of unspecified ovary: Secondary | ICD-10-CM

## 2018-10-15 LAB — POC GLU MONITORING DEVICE: POC Glucose Monitoring Device: 107 mg/dL — ABNORMAL HIGH (ref 70–100)

## 2018-10-15 MED ORDER — F-18 FDG 9.1 millicurie
Freq: Once | Status: AC | PRN
Start: 2018-10-15 — End: 2018-10-15
  Administered 2018-10-15: 15:00:00 9.1 via INTRAVENOUS

## 2018-10-18 NOTE — Telephone Encounter (Signed)
Pt had previous scan done and said that it is suggested that she have an ultrasound done. Pt wants to know if she can get ultrasound before appt with Dr. Magnus Ivan.

## 2018-10-20 ENCOUNTER — Inpatient Hospital Stay: Admit: 2018-10-20 | Discharge: 2018-10-24 | Payer: Medicare (Managed Care)

## 2018-10-20 DIAGNOSIS — E041 Nontoxic single thyroid nodule: Secondary | ICD-10-CM

## 2018-10-21 ENCOUNTER — Other Ambulatory Visit: Admit: 2018-10-21 | Payer: Medicare (Managed Care)

## 2018-10-21 ENCOUNTER — Ambulatory Visit: Admit: 2018-10-21 | Payer: Medicare (Managed Care)

## 2018-10-21 DIAGNOSIS — C569 Malignant neoplasm of unspecified ovary: Secondary | ICD-10-CM

## 2018-10-21 LAB — CBC
Hematocrit: 32.3 % — ABNORMAL LOW (ref 35.0–45.0)
Hemoglobin: 11 g/dL — ABNORMAL LOW (ref 11.7–15.5)
MCH: 36.2 pg — ABNORMAL HIGH (ref 27.0–33.0)
MCHC: 34 g/dL (ref 32.0–36.0)
MCV: 106.7 fL — ABNORMAL HIGH (ref 80.0–100.0)
MPV: 7.5 fL (ref 7.5–11.5)
Platelets: 219 10E3/uL (ref 140–400)
RBC: 3.03 10E6/uL — ABNORMAL LOW (ref 3.80–5.10)
RDW: 14 % (ref 11.0–15.0)
WBC: 5 10E3/uL (ref 3.8–10.8)

## 2018-10-21 LAB — T4, FREE: Free T4: 0.9 ng/dL (ref 0.61–1.76)

## 2018-10-21 LAB — DIFFERENTIAL
Basophils Absolute: 60 /uL (ref 0–200)
Basophils Relative: 1.2 % — ABNORMAL HIGH (ref 0.0–1.0)
Eosinophils Absolute: 40 /uL (ref 15–500)
Eosinophils Relative: 0.8 % (ref 0.0–8.0)
Lymphocytes Absolute: 1510 /uL (ref 850–3900)
Lymphocytes Relative: 30.2 % (ref 15.0–45.0)
Monocytes Absolute: 490 /uL (ref 200–950)
Monocytes Relative: 9.8 % (ref 0.0–12.0)
Neutrophils Absolute: 2900 /uL (ref 1500–7800)
Neutrophils Relative: 58 % (ref 40.0–80.0)
nRBC: 0 /100{WBCs} (ref 0–0)

## 2018-10-21 LAB — COMPREHENSIVE METABOLIC PANEL
ALT: 13 U/L (ref 7–52)
AST: 20 U/L (ref 13–39)
Albumin: 4.6 g/dL (ref 3.5–5.7)
Alkaline Phosphatase: 55 U/L (ref 36–125)
Anion Gap: 8 mmol/L (ref 3–16)
BUN: 22 mg/dL (ref 7–25)
CO2: 29 mmol/L (ref 21–33)
Calcium: 9.6 mg/dL (ref 8.6–10.3)
Chloride: 99 mmol/L (ref 98–110)
Creatinine: 0.74 mg/dL (ref 0.60–1.30)
Glucose: 117 mg/dL (ref 70–100)
Osmolality, Calculated: 286 mOsm/kg (ref 278–305)
Potassium: 4 mmol/L (ref 3.5–5.3)
Sodium: 136 mmol/L (ref 133–146)
Total Bilirubin: 0.7 mg/dL (ref 0.0–1.5)
Total Protein: 7 g/dL (ref 6.4–8.9)
eGFR AA CKD-EPI: >90 See note.
eGFR NONAA CKD-EPI: 78 See note.

## 2018-10-21 LAB — CA 125: CA 125: 4.8 U/mL (ref 5.5–35.0)

## 2018-10-21 LAB — TSH: TSH: 1.66 u[IU]/mL (ref 0.45–4.12)

## 2018-10-21 LAB — MAGNESIUM: Magnesium: 1.7 mg/dL (ref 1.5–2.5)

## 2018-10-21 LAB — T3: T3, Total: 206.7 ng/dL (ref 60.0–220.0)

## 2018-10-21 NOTE — Unmapped (Signed)
History of Present Illness  Kelsey Sullivan is a 77 y.o. female with   Chief Complaint   Patient presents with   ??? 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 She declined adjuvant therapy. She used natural methods, went to a raw diet for a year, etc.  She developed a small bowel obstruction in 03/2017, resolved on its own, with NED at that time.  In 01/2018, she experienced mid abdominal pain, and seen by PCP and ED, with CT of abdomen and pelvis showing interval development of necrotic retroperitoneal lymphadenopathy with largest node measuring 4.9  4.2 x 4.8 cm. Her CA 125 on 02/18/18 was 197.8.  She underwent a ct-guided nodal biopsy on 02/26/18, with pathology confirming recurrent disease.  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 She was seen at [facility] cancer in [place] with Dr. Ishmael Holter. She completed 6 cycles of chemotherapy with Carboplatin and Paclitaxel-weekly (C6D1 was 08/27/18). CA 125 was 8.7 at C6.     She underwent CT CAP on 10/16/2019n revealed ned in chest and interval decrease in size of a left periaortic necrotic lymph node from 1.8 to 1.6 cm. 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 PET on 10/15/18 shows reassuring  intraabdominal findings with resolution of the previous hypermetabolic para-aortic lymph nodes and hepatic dome lesions, but did note uptake within the thyroid. She underwent thyroid US on 10/20/18 with a 1.2 cm irregular hypoechoic nodule in the left thyroid lobe is suspicious. Recommend FNA for further evaluation.    10/15/2018:   PET:  Hypermetabolic focus within the left thyroid lobe.  Consider further evaluation with ultrasound, if clinically indicated.     NECK: There is a focus of uptake within the left thyroid lobe (axial image 62), with max SUV of 3.3    10/20/2018:   Thyroid US: 1.2 cm irregular hypoechoic nodule in the left thyroid lobe is suspicious. Recommend FNA for further evaluation.     1.0 cm nodule right thyroid lobe containing macrocalcification is indeterminate. Recommend follow-up thyroid ultrasound in one year to evaluate for stability.     Recommend= FNA for suspicious left thyroid nodule         She is seen today for follow up and scan review.very anxious about thyroid results.     Kelsey Sullivan is doing well. She is tired, but getting better   She denies any bloating or fullness.  She reports normal bowel/bladder habits.   She denies any GU concerns. Denies any dysuria, frequency, hematuria, flank pain or fevers.  She denies any vaginal bleeding or discharge.   She has mild neuropathy.   She reports no leg swelling.   Her  appetite is good, and reports no nausea or vomiting. Tolerating solid foods.      Patient Active Problem List    Diagnosis   ??? Small bowel obstruction (CMS Dx)   ??? H/O small bowel obstruction   ??? 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 Pt declined adjuvant therapy      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     Posterior cul de sac biopsy-metastatic adenocarcinoma.     Omentectomy- metastatic adenocarcinoma     LND-negative     Residual right IP ligament, Anterior cul de sac biopsy, right pelvic side will biopsy, left pelvic side wall, right gutter and left gutter-->benign    04/06/2017:   CT [name]- mild small bowel obstruction with transition point in the left pelvis adjacent to the anterior abdominal wall. Low density in the common femoral veins bilaterally thought likely to represent flow artificat, may consider follow up with lomer extremity Doppler to ensure there is no underlying deep venous thrombosis.    01/29/2018:   CTAP:  Interval development of necrotic retroperitoneal lymphadenopathy with largest node measuring 4.9  4.2 x 4.8 cm. Origin is indeterminate.  Options would include CT directed biopsy as well as PET/CT.    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  No other focal areas of FD avid malignancy are found. Specifically, no lymph node activity is found outside of the retroperitoneum and there is no evidence for FDG avid pulmonary, skeletal or hepatic parenchymal metastasis.    02/2018:   Patient saw Dr. Ishmael Holter who recommended Carbo/Taxol with an AUC of 6.   Dr. Maren Reamer office submitted [facility] one testing. Patient would like Genetic Testing    04/12/2018:   IR port placed    04/16/2018:   C1 weekly taxol 80 mg/m2 D1,8,15, Carboplatin AUC 6 D1 q 21 days. PCEs on thurs, txt on fridays. EXAMS ON EVEN CYCLES     CA 125: 523.4     Genetic testing: Neg for CS mutation, no VUS, lifetime remaining breast cancer risk -3.4%    05/07/2018:   C2 weekly taxol 80 mg/m2 D1,8,15, Carboplatin AUC 6 D1 q 21 days. PCEs on thurs, txt on fridays. EXAMS ON EVEN CYCLES     CA 125: 162.2    05/20/2018:   Early PCE due to scheduling, due 6/21    05/28/2018  Delay C3 due to low ANC     CA 125: 25  06/04/2018  C3 weekly taxol 80 mg/m2 D1,8,15, Carboplatin AUC 6 D1 q 21 days. EXAMS ON EVEN CYCLES.      CA 125    06/25/2018:   HOLD chemo due to low anc (1047)- defer to next week, and change to q 28 days cycle to build in an off week to allow for counts to recover     CA 125: 10.6    07/02/2018:   C4 weekly taxol 80 mg/m2 D1,8,15, (missed day 5 due to admission) Carboplatin AUC 6 D1 q 28 days. ANC still lowish 1480s- neulasta Day 16      EXAMS ON EVEN CYCLES.  Review her genetics testing results      CA 125 7.2    07/07/2018:   [facility] admission: SBO, neutropenic fever     CTAP:Small bowel obstruction with transition point in the anterior mid abdomen near the midline. Small amount of free fluid in the abdomen, which is nonspecific. 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     Peritoneum: Small amount of free fluid in the abdomen. A para-aortic density with central low attenuation measures 2.5 x 2 cm and is significantly decreased in size from prior.    07/16/2018:   CA 125: 28.5    07/30/2018:  C5 weekly taxol 80 mg/m2 D1,8,15, Carboplatin AUC 6 D1 q 28 days. Neulasta Day 16      EXAMS ON EVEN CYCLES.      CA 125: 11.4    08/27/2018:   C6 weekly taxol 80 mg/m2 D1,8,15, Carboplatin AUC 6 D1 q 28 days. Neulasta Day 16. Order Ct CAP. RTC  10/11 (3 weeks)     EXAMS ON EVEN CYCLES. Discussed option of maintenance parp I- pt not decided (doesn't want to feel tired anymore)     CA 125: 8.7    09/20/2018:   Port removed: While the skin surface appeared inflamed, there was no evidence of infection in the port reservoir.    09/22/2018:   CT Chest: No evidence of thoracic metastatic disease.     Suspected small seroma in the base of the right neck. ??Necrotic metastatic lesion could also have this appearance but considered less likely.     CtAP: Interval decrease in size of a left periaortic necrotic lymph node. No evidence of new metastatic disease.     Lymphatics: Previously demonstrated left para-aortic lymph node with central necrosis currently measures 1.2 cm on series 5 image 45, previously measured 1.8 cm in the short axis.    10/05/2018:   Reviewed scans- aortic node has decreased in size uncertain if active cancer or just scarring. Check PET (to investigate of node is hypermetabolic as well as assess for liver implant previously noted on pet. Offered maintenance with parp I- rubraca 600 mg BID. Pt interested-order placed thru [facility]. RTC BC in a few weeks to start drug and review PET     10/15/2018:   PET: Resolution of the previous hypermetabolic para-aortic lymph nodes and hepatic dome lesions. Hypermetabolic focus within the left thyroid lobe.  Consider further evaluation with ultrasound, if clinically indicated.     NECK: There is a focus of uptake within the left thyroid lobe (axial image 62), with max SUV of 3.3    10/20/2018:   Thyroid US: 1.2 cm irregular hypoechoic nodule in the left thyroid lobe is suspicious. Recommend FNA for further evaluation.     1.0 cm nodule right thyroid lobe  containing macrocalcification is indeterminate. Recommend follow-up thyroid ultrasound in one year to evaluate for stability.     Recommend= FNA for suspicious left thyroid nodule     10/21/2018:   Order thyroid FNA. Pt has not gotten her rubraca yet due  to financial issues. RTC2 weeks for PCE#1 (11/12/18)     CA 125:   4.8    11/12/2018:   C1 rubraca 600 mg po BID planned     Disposition: chemo, imaging after 3 cucles, CA 125, possible parp maintenance after, genetic testing neg, [facility] one testing results from [facility] in [place]- this was not performed   Current disease status: Neg for CS mutation, no VUS, lifetime remaining breast cancer risk -3.4%  Genetics: negative for mutations, breast cancer lifetime risk 3.4%  Survivorship plan: pending completion of treatment              Past Medical History  She  has a past medical history of Bowel obstruction (CMS Dx) (2018) and Ovarian cancer (CMS Dx) (2014).  Past Medical History:   Diagnosis Date   ??? Bowel obstruction (CMS Dx) 2018   ??? Ovarian cancer (CMS Dx) 2014       Past Surgical History  Past Surgical History:   Procedure Laterality Date   ??? ABDOMINOPLASTY  1999   ??? BLEPHAROPLASTY  2000   ??? DILATION AND CURETTAGE OF UTERUS  1979   ??? ELBOW SURGERY  1990   ??? EYE SURGERY  1994   ??? KNEE ARTHROPLASTY Right    ??? ROTATOR CUFF REPAIR     ??? TRANSUMBILICAL AUGMENTATION MAMMAPLASTY     ??? VAGINAL HYSTERECTOMY  1979       Family History  No family history on file.    Family cancer history for ovarian, uterine and colon cancer is negative other than above.       Social History  Social History     Socioeconomic History   ??? Marital status: Divorced     Spouse name: None   ??? Number of children: None   ??? Years of education: None   ??? Highest education level: None   Occupational History   ??? None   Social Needs   ??? Financial resource strain: None   ??? Food insecurity:     Worry: None     Inability: None   ??? Transportation needs:     Medical: None     Non-medical: None   Tobacco Use   ??? Smoking status: Former Smoker   ??? Smokeless tobacco: Never Used   Substance and Sexual Activity   ??? Alcohol use: Yes     Comment: 2-3 drinks a day   ??? Drug use: No   ??? Sexual activity: None   Lifestyle   ??? Physical activity:     Days per week: None      Minutes per session: None   ??? Stress: None   Relationships   ??? Social connections:     Talks on phone: None     Gets together: None     Attends religious service: None     Active member of club or organization: None     Attends meetings of clubs or organizations: None     Relationship status: None   ??? Intimate partner violence:     Fear of current or ex partner: None     Emotionally abused: None     Physically abused: None     Forced sexual activity: None  Other Topics Concern   ??? Caffeine Use No   ??? Occupational Exposure No   ??? Exercise Yes   ??? Seat Belt Yes   Social History Narrative    Mammogram-6 years ago and it was normal. Pt stated she isn't getting them anymore.      Colonoscopy-10 years ago        Pt reports she can lie flat in bed without SOB.    Pt states she can walk a flight of stairs and city block without chest pain and SOB>         Past OB/GYN History  G3P2  She reports menarche at age 26 and menopause at age 16-surgical.  She denies a history of STIs.  She denies a history of abnormal cervical cytology and reports her last cytologic examination she is unsure of. She has taken HRT.     Health maintenance:  Mammogram: Date 6 years ago Results normal  Colonoscopy: Date 10 years ago Results normal    Allergies  She is allergic to bactrim [sulfamethoxazole-trimethoprim]; nsaids (non-steroidal anti-inflammatory drug); celecoxib; and metoclopramide.    BMI  Body mass index is 21.18 kg/m??.    Disease Status: Responding (10/06/2018  1:58 PM)    Karnofsky Score: 90  ECOG Score: 0         Histories  She has a past medical history of Bowel obstruction (CMS Dx) (2018) and Ovarian cancer (CMS Dx) (2014).    She has a past surgical history that includes Dilation and curettage of uterus (1979); Vaginal hysterectomy (1979); Elbow surgery (1990); Eye surgery (1994); Abdominoplasty (1999); Blepharoplasty (2000); Transumbilical augmentation mammaplasty; Rotator cuff repair; and Knee Arthroplasty (Right).    Her family  history is not on file.    She reports that she has quit smoking. She has never used smokeless tobacco. She reports current alcohol use. She reports that she does not use drugs.    Allergies  Bactrim [sulfamethoxazole-trimethoprim]; Nsaids (non-steroidal anti-inflammatory drug); Celecoxib; and Metoclopramide    Medications  Outpatient Encounter Medications as of 10/21/2018   Medication Sig Dispense Refill   ??? ascorbic acid, vitamin C, (VITAMIN C) 1000 MG tablet Take 1,000 mg by mouth daily.     ??? dexamethasone (DECADRON) 4 MG tablet Take 8 mg by mouth daily with food on day 2, 3, and 4 of chemotherapy cycle 18 tablet 6   ??? digestive enzymes Cap Take by mouth.     ??? ergocalciferol, vitamin D2, (VITAMIN D ORAL) Take 10,000 Units by mouth daily.            ??? GLUTATHIONE-L ORAL Take by mouth daily.     ??? ibuprofen (ADVIL,MOTRIN) 200 MG tablet Take 200-800 mg by mouth every 8 hours as needed for Pain.     ??? ibuprofen-diphenhydramine HCl 200-25 mg Cap Take 1 tablet by mouth at bedtime as needed.     ??? Lactobac no.41/Bifidobact no.7 (PROBIOTIC-10 ORAL) Take 1 capsule by mouth daily. [employer] brand     ??? lidocaine-prilocaine (EMLA) cream Apply topically daily as needed. Apply pea-size amount to port site 30-60 min prior to accessing port 30 g 1   ??? magnesium oxide (MAG-OX) 400 mg tablet Take 1 tablet (400 mg total) by mouth 3 times a day. Indications: hypomagnesemia 90 tablet 0   ??? MAGNESIUM SULFATE TOP Apply topically daily.     ??? ondansetron (ZOFRAN) 4 MG tablet Take 1 tablet (4 mg total) by mouth every 8 hours as needed for Nausea. Must  wait to take until 5 days after receiving Aloxi 20 tablet 2   ??? proCHLORPERazine (COMPAZINE) 10 MG tablet Take 1 tablet (10 mg total) by mouth every 6 hours as needed (For nausea and vomiting). 30 tablet 3   ??? vit b complex w-b 12 (B COMPLEX-VITAMIN B12) tablet Take 1 tablet by mouth daily.     ??? vitamin E 100 UNIT capsule Take 100 Units by mouth daily.     ??? omeprazole (PRILOSEC)  20 MG capsule Take 20 mg by mouth daily.            ??? rucaparib (RUBRACA) 300 mg Tab Take 2 tablets (600 mg) by mouth 2 times a day. 120 tablet 11     No facility-administered encounter medications on file as of 10/21/2018.         Review of Systems   Constitutional: Positive for fatigue. Negative for activity change, appetite change, chills, fever, weight gain and weight loss.   HENT: Negative for congestion, mouth sores, rhinorrhea, sinus pressure, sore throat and trouble swallowing.    Eyes: Negative for photophobia, redness, itching and visual disturbance.   Respiratory: Negative for apnea, cough, chest tightness and shortness of breath.    Cardiovascular: Negative for chest pain, palpitations and leg swelling.   Gastrointestinal: Negative for abdominal distention, abdominal pain, anal bleeding, bloating, blood in stool, constipation, diarrhea, nausea and vomiting.   Genitourinary: Negative for decreased urine volume, difficulty urinating, dysuria, frequency, genital sores, hematuria, pelvic pain, urgency, vaginal bleeding, vaginal discharge and vaginal pain.   Musculoskeletal: Negative for arthralgias, back pain, joint swelling and neck pain.   Skin: Negative for color change and pallor.   Neurological: Positive for numbness. Negative for dizziness, seizures, weakness and headaches.   Hematological: Negative for adenopathy. Does not bruise/bleed easily.   Psychiatric/Behavioral: Negative for agitation, confusion and depression.       Vitals  Blood pressure 118/75, pulse 71, temperature 97.4 ??F (36.3 ??C), temperature source Temporal, resp. rate 16, height 5' 4 (1.626 m), weight 123 lb 6.4 oz (56 kg), SpO2 100 %.    Physical Exam   Vitals reviewed.  Constitutional: She is oriented to person, place, and time. She appears well-developed and well-nourished.   HENT:   Head: Normocephalic and atraumatic.   Eyes: Conjunctivae and EOM are normal.   Neck: Normal range of motion. Neck supple. No thyromegaly present.    Cardiovascular: Normal rate, regular rhythm and normal heart sounds.   Pulmonary/Chest: Effort normal and breath sounds normal. No respiratory distress. She has no wheezes. She has no rales.   Port site removal healing well   Abdominal: Soft. Bowel sounds are normal. She exhibits no distension and no mass. There is no tenderness. There is no rebound and no guarding.   No masses or tenderness.  No hernia palpable.   No hepatosplenomegaly.     Prior scars from xlap, tummy tuck   Genitourinary:    Genitourinary Comments: Normal external genitalia/vulva.  Normal urethral meatus, urethra, bladder, anus and perineum.  Normal vagina and vaginal cuff.  Uterus, cervix and adnexa surgically absent.  Normal rectum.  There is no nodularity in the posterior culdesac or rectovaginal septum.       Musculoskeletal: Normal range of motion.         General: No edema.     Lymphadenopathy:     She has no cervical adenopathy.   Neurological: She is alert and oriented to person, place, and time.   Skin: Skin is  warm and dry.   Psychiatric: She has a normal mood and affect. Her behavior is normal. Judgment and thought content normal.          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     Posterior cul de sac  biopsy-metastatic adenocarcinoma.     Omentectomy- metastatic adenocarcinoma     LND-negative     Residual right IP ligament, Anterior cul de sac biopsy, right pelvic side will biopsy, left pelvic side wall, right gutter and left gutter-->benign    04/06/2017:   CT [name]- mild small bowel obstruction with transition point in the left pelvis adjacent to the anterior abdominal wall. Low density in the common femoral veins bilaterally thought likely to represent flow artificat, may consider follow up with lomer extremity Doppler to ensure there is no underlying deep venous thrombosis.    01/29/2018:   CTAP:  Interval development of necrotic retroperitoneal lymphadenopathy with largest node measuring 4.9  4.2 x 4.8 cm. Origin is indeterminate.  Options would include CT directed biopsy as well as PET/CT.    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  No other focal areas of FD avid malignancy are found. Specifically, no lymph node activity is found outside of the retroperitoneum and there is no evidence for FDG avid pulmonary, skeletal or hepatic parenchymal metastasis.    Cancer Staging:  Cancer Staging  Malignant neoplasm of ovary (CMS Dx)  Staging form: Ovary, Fallopian Tube, And Primary Peritoneal Carcinoma, AJCC 8th Edition  - Clinical: FIGO Stage IIIC - Signed by Mikle Bosworth, MD on 04/01/2018      Disease Status: Responding (10/06/2018  1:58 PM)    Karnofsky Score: 90  ECOG Score: 0          Assessment & Plan  My impression is Ms. 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 She completed 6 cycles of chemotherapy with Carboplatin and Paclitaxel-weekly (C6D1 was 08/27/18). CA 125 was  8.7 at C6. Plan for Rucaparib therapy.     She underwent CT CAP on 10/16/2019n revealed ned in chest and interval decrease in size of a left periaortic necrotic lymph node from 1.8 to 1.6 cm. 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       We reviewed her scans today which show at least a partial response- the previously noted aortic node has decreased in size from 1.8 cm to 1.2 cm.   PET on 10/15/18 shows reassuring intraabdominal findings with resolution of the previous hypermetabolic para-aortic lymph nodes and hepatic dome lesions, but did note uptake within the thyroid. She underwent thyroid US on 10/20/18 with a 1.2 cm irregular hypoechoic nodule in the left thyroid lobe is suspicious. Recommend FNA for further evaluation. Her CA 125 is normal today. I continue to recommend maintenance with parp I- rubraca 600 mg BID. She is awaiting getting some financial assistance as current out of pocket expense is $2200. RTC 2 weeks to hopefull start C1.    After discussing the above she has elected to proceed with rucaparib 600 mg po BID (28 days cycles) until disease progression. Pt has submitted paperwork for drug, and received quote of 2,200 per month. She has resubmitted financial papers, and is waiting to hear back    __RTC [facility] on 11/12/2018 prior to starting for PCE#1 and labs. Pt will call on 12/5 if she does not have Rucaparib in order to push back appointment for starting      Thyroid Nodule:   Incidental finding on PET scan of hypermetabolic focus within left thyroid nodule. Ultrasound performed on 10/20/18 with 1.2 cm  irregular hypoechoic nodule, appearing suspicious. Recommend FNA for further evaluation, which was ordered.   __FNA ordered  __referral to ENT made     Genetics:  previously discussed her negative genetic testing results.      Pain  Pain Score: Zero (10/21/2018 10:54 AM)    She denies presence of pain.  Pain will be reassessed at next visit.             Lorn Junes, MD  OB/GYN, PGY-2     I saw and personally examined the patient today with my resident. I discussed the findings and therapeutic plan with the patient. I repeated, reviewed and agree with the history of present illness, past medical histories, family history, social history, medication list, and allergies as listed. The review of systems is as noted above. My physical exam confirms the findings listed above. Review of labs, pathology reports, radiograph reports, and medical records confirm the findings noted above. I agree with the assessment and plan as noted above. I have edited the note where appropriate.     I have personally performed a face to face diagnostic evaluation on this patient, seen initially by the resident. I have personally developed the care plan as outlined in the Assessment and Plan.      Complexity: mod     I spent a total of 25 minutes face-to-face of which >50% was spent in counseling and/or coordination of care, documentation and counseling.with patient and/or family.      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      Maggie Font, MD  Gynecology Oncology  [phone]      Medical Decision Making:  The following items were considered in medical decision making:  Review / order clinical lab tests  Review / order radiology tests  Review / order other diagnostic tests/interventions

## 2018-10-26 NOTE — Unmapped (Signed)
MEDICATION ACCESS SERVICES            10-21-18 [facility]ication [facility] Services received all necessary application signatures for Mrs. Gali's application for Rubraca. Currently her copay through [employer] is $2501.09. At the time of the application, no available foundation help was found We are applying for free drug, because Mrs. Yeske has a limited income of $28,000 per year in her household of one.  Her completed application was faxed into [employer] at [phone] on 10/21/18    [facility] received a call from Jonny Ruiz, [employer] for??[employer]. John explained that he attempted to reach out to Mrs Arnett yesterday but was unable to reach her. He was calling to ask Mrs. Cherney to apply for copay assistance through two foundations that just opened funding on 10/25/18, [employer] ($4000) and [employer] ($3500).     On Mrs. Stuermers behalf, I pursued funding through [employer], as they are offering more copay assistance, and she was approved.     I'm aware this is only a temporary fix, as her copay for Rubraca is $2502 but Jonny Ruiz informed me that this is the way we have to go, and we can reapply for patient assistance once this foundation has been exhausted.   ??   I have added the following secondary billing plan to Mrs. Racanelli's profile in [facility].   [id number]   MVH:[id number]   [id number]   VO:[id number]     Payton Emerald, pharmacist at [facility], secondary billed the Rubraca claim and it came back $0.  [facility]ication [facility] called the patient at telephone number provided in Epic but was unable to reach Mrs. Popp. A message was left asking her to return our call. Once Medication Access make contact with her, we will explain the billing process and need to reapply to [employer] once funds are exhausted, then Payton Emerald will reach out to Mrs. Derryberry for shipment details and to address and questions  or concerns about the medication         Roxine Caddy  Medication [facility] Services Patient Advocate  [id number]  [place], [place] [address]  T: [phone]  F: [phone]

## 2018-10-27 ENCOUNTER — Inpatient Hospital Stay: Payer: Medicare (Managed Care)

## 2018-10-27 DIAGNOSIS — C569 Malignant neoplasm of unspecified ovary: Secondary | ICD-10-CM

## 2018-10-28 NOTE — Unmapped (Signed)
[facility] Note    Initial Pharmacist Assessment     Kelsey Sullivan is a 77 y.o. old patient who  has a past medical history of Bowel obstruction (CMS Dx) (2018) and Ovarian cancer (CMS Dx) (2014)..    Prescription received for: Rubraca (rucaparib)    Chart reviewed for diagnosis, medication appropriateness (including dose and duration) and potential interactions    Contacted patient who is new to [facility] and rucaparib.     Reviewed the role of specialty pharmacy.  Welcome packet will be provided with first order which includes bill of rights, privacy notice, pharmacy contact information (including 24- hour on call pharmacist), hours of operations, complaint process, refill procedure, information about patient assistance programs and patient management program (including opt-out option).     Provided patient with handout: Chemotherapy Safety: Oral.     Discussed indication, dose, duration of therapy, storage, administration, medication handling, potential side effects, food/drug interactions and proper handling.  Patient verbalized understanding.     Patient's prescription and non-prescription medications and natural supplements were not reviewed and updated.     Medication changes: not reviewed    Allergies:   Allergies   Allergen Reactions   ??? Bactrim [Sulfamethoxazole-Trimethoprim] Other (See Comments)     Hallucinations, anxiety   ??? Nsaids (Non-Steroidal Anti-Inflammatory Drug)      Stomach ache   ??? Celecoxib Nausea And Vomiting   ??? Metoclopramide Other (See Comments)     Does not remember       Plan:    - Prescription will be filled by [facility].    Method of delivery:  shipped via UPS by end of day 10/29/18. Patient should receive 10/30/18.    - Recommended monitoring: CBC, signs/symptoms of MDS/AML    - Specialty pharmacy staff will contact patient prior to next refill due date for assessment of adherence, side effects, labs, and patient consent to  bill/ship.        Specialty Pharmacy Care Management Plan     1. Needs assessment: none identified  2. Strategies to address needs: not applicable  3. Goals and time frame: disease control   4. Resources available to implement care management plan: copay assistance  5. Patient's motivation: high  Treatment based on NCCN guidelines.

## 2018-10-29 ENCOUNTER — Ambulatory Visit: Payer: Medicare (Managed Care)

## 2018-10-29 MED FILL — RUCAPARIB 300 MG TABLET: 300 300 mg | ORAL | 30 days supply | Qty: 120 | Fill #0

## 2018-11-12 ENCOUNTER — Other Ambulatory Visit: Admit: 2018-11-12 | Payer: Medicare (Managed Care)

## 2018-11-12 ENCOUNTER — Ambulatory Visit: Admit: 2018-11-12 | Payer: Medicare (Managed Care)

## 2018-11-12 DIAGNOSIS — C569 Malignant neoplasm of unspecified ovary: Secondary | ICD-10-CM

## 2018-11-12 LAB — COMPREHENSIVE METABOLIC PANEL
ALT: 17 U/L (ref 7–52)
AST: 23 U/L (ref 13–39)
Albumin: 4.8 g/dL (ref 3.5–5.7)
Alkaline Phosphatase: 67 U/L (ref 36–125)
Anion Gap: 8 mmol/L (ref 3–16)
BUN: 14 mg/dL (ref 7–25)
CO2: 29 mmol/L (ref 21–33)
Calcium: 9.7 mg/dL (ref 8.6–10.3)
Chloride: 96 mmol/L (ref 98–110)
Creatinine: 0.73 mg/dL (ref 0.60–1.30)
Glucose: 116 mg/dL (ref 70–100)
Osmolality, Calculated: 277 mOsm/kg (ref 278–305)
Potassium: 4.2 mmol/L (ref 3.5–5.3)
Sodium: 133 mmol/L (ref 133–146)
Total Bilirubin: 0.7 mg/dL (ref 0.0–1.5)
Total Protein: 7.1 g/dL (ref 6.4–8.9)
eGFR AA CKD-EPI: >90 See note.
eGFR NONAA CKD-EPI: 79 See note.

## 2018-11-12 LAB — CBC
Hematocrit: 36.7 % (ref 35.0–45.0)
Hemoglobin: 12.4 g/dL (ref 11.7–15.5)
MCH: 35 pg (ref 27.0–33.0)
MCHC: 33.8 g/dL (ref 32.0–36.0)
MCV: 103.3 fL (ref 80.0–100.0)
MPV: 7.8 fL (ref 7.5–11.5)
Platelets: 174 10*3/uL (ref 140–400)
RBC: 3.55 10*6/uL (ref 3.80–5.10)
RDW: 13 % (ref 11.0–15.0)
WBC: 4.4 10*3/uL (ref 3.8–10.8)

## 2018-11-12 LAB — DIFFERENTIAL
Basophils Absolute: 31 /uL (ref 0–200)
Basophils Relative: 0.7 % (ref 0.0–1.0)
Eosinophils Absolute: 31 /uL (ref 15–500)
Eosinophils Relative: 0.7 % (ref 0.0–8.0)
Lymphocytes Absolute: 1025 /uL (ref 850–3900)
Lymphocytes Relative: 23.3 % (ref 15.0–45.0)
Monocytes Absolute: 330 /uL (ref 200–950)
Monocytes Relative: 7.5 % (ref 0.0–12.0)
Neutrophils Absolute: 2983 /uL (ref 1500–7800)
Neutrophils Relative: 67.8 % (ref 40.0–80.0)
nRBC: 0 /100 WBC (ref 0–0)

## 2018-11-12 LAB — MAGNESIUM: Magnesium: 1.9 mg/dL (ref 1.5–2.5)

## 2018-11-12 LAB — CA 125: CA 125: 5.5 U/mL (ref 5.5–35.0)

## 2018-11-12 NOTE — Unmapped (Addendum)
History of Present Illness  Kelsey Sullivan is a 77 y.o. female with   Chief Complaint   Patient presents with   ??? 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 She declined adjuvant therapy. She used natural methods, went to a raw diet for a year, etc.  She developed a small bowel obstruction in 03/2017, resolved on its own, with NED at that time.  In 01/2018, she experienced mid abdominal pain, and seen by PCP and ED, with CT of abdomen and pelvis showing interval development of necrotic retroperitoneal lymphadenopathy with largest node measuring 4.9  4.2 x 4.8 cm. Her CA 125 on 02/18/18 was 197.8.  She underwent a ct-guided nodal biopsy on 02/26/18, with pathology confirming recurrent disease.  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 She was seen at [facility] in [place] with Dr. Ishmael Holter. She completed 6 cycles of chemotherapy with Carboplatin and Paclitaxel-weekly (C6D1 was 08/27/18). CA 125 was 8.7 at C6.     She underwent CT CAP on 09/22/2018 revealed ned in chest and interval decrease in size of a left periaortic necrotic lymph node from 1.8 to 1.6 cm. 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 PET on 10/15/18 shows reassuring  intraabdominal findings with resolution of the previous hypermetabolic para-aortic lymph nodes and hepatic dome lesions, but did note uptake within the thyroid. She underwent thyroid US on 10/20/18 with a 1.2 cm irregular hypoechoic nodule in the left thyroid lobe is suspicious. Recommend FNA for further evaluation.    10/15/2018:   PET:  Hypermetabolic focus within the left thyroid lobe.  Consider further evaluation with ultrasound, if clinically indicated.     NECK: There is a focus of uptake within the left thyroid lobe (axial image 62), with max SUV of 3.3    10/20/2018:   Thyroid US: 1.2 cm irregular hypoechoic nodule in the left thyroid lobe is suspicious. Recommend FNA for further evaluation.     1.0 cm nodule right thyroid lobe containing macrocalcification is indeterminate. Recommend follow-up thyroid ultrasound in one year to evaluate for stability.     Recommend= FNA for suspicious left thyroid nodule   The FNA was ordered buy has not been completed yet        She is seen today for follow up and PCE for maintenance rubraca PCE.     Kelsey Sullivan is doing well. She had her rubraca now and is willing to try it for one month. She is worried about the side effect of fatigue.   She denies any bloating or fullness.  She reports normal bowel/bladder habits.   She denies any GU concerns. Denies any dysuria,  frequency, hematuria, flank pain or fevers.  She denies any vaginal bleeding or discharge.   She has mild neuropathy.   She reports no leg swelling.   Her appetite is good, and reports no nausea or vomiting. Tolerating solid foods.      Patient Active Problem List    Diagnosis   ??? Small bowel obstruction (CMS Dx)   ??? H/O small bowel obstruction   ??? 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 Pt declined adjuvant therapy      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     Posterior cul de sac biopsy-metastatic adenocarcinoma.     Omentectomy- metastatic adenocarcinoma     LND-negative     Residual right IP ligament, Anterior cul de sac biopsy, right pelvic side will biopsy, left pelvic side wall, right gutter and left gutter-->benign    04/06/2017:   CT [name]- mild small bowel obstruction with transition point in the left pelvis adjacent to the anterior abdominal wall. Low density in the common femoral veins bilaterally thought likely to represent flow artificat, may consider follow up with lomer extremity Doppler to ensure there is no underlying deep venous thrombosis.    01/29/2018:   CTAP:  Interval development of necrotic retroperitoneal lymphadenopathy with largest node measuring 4.9  4.2 x 4.8 cm. Origin is indeterminate.  Options would include CT directed biopsy as well as PET/CT.    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  No other focal areas of FD avid malignancy are found. Specifically, no lymph node activity is found outside of the retroperitoneum and there is no evidence for FDG avid  pulmonary, skeletal or hepatic parenchymal metastasis.    02/2018:   Patient saw Dr. Ishmael Holter who recommended Carbo/Taxol with an AUC of 6.  Dr. Maren Reamer office submitted foundation one testing. Patient would like Genetic Testing    04/12/2018:   IR port placed    04/16/2018:   C1 weekly taxol 80 mg/m2 D1,8,15, Carboplatin AUC 6 D1 q 21 days. PCEs on thurs, txt on fridays. EXAMS ON EVEN CYCLES     CA 125: 523.4     Genetic testing: Neg for CS mutation, no VUS, lifetime remaining breast cancer risk -3.4%    05/07/2018:   C2 weekly taxol 80 mg/m2 D1,8,15, Carboplatin AUC 6 D1 q 21 days. PCEs on thurs, txt on fridays. EXAMS ON EVEN CYCLES     CA 125: 162.2  05/20/2018:   Early PCE due to scheduling, due 6/21    05/28/2018  Delay C3 due to low ANC     CA 125: 25    06/04/2018  C3 weekly taxol 80 mg/m2 D1,8,15, Carboplatin AUC 6 D1 q 21 days. EXAMS ON EVEN CYCLES.      CA 125    06/25/2018:   HOLD chemo due to low anc (1047)- defer to next week, and change to q 28 days cycle to build in an off week to allow for counts to recover     CA 125: 10.6    07/02/2018:   C4 weekly taxol 80 mg/m2 D1,8,15, (missed day 5 due to admission) Carboplatin AUC 6 D1 q 28 days. ANC still lowish 1480s- neulasta Day 16      EXAMS ON EVEN CYCLES.  Review her genetics testing results      CA 125 7.2    07/07/2018:   [facility] admission: SBO, neutropenic fever     CTAP:Small bowel obstruction with transition point in the anterior mid abdomen near the midline. Small amount of free fluid in the abdomen, which is nonspecific. 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     Peritoneum: Small amount of free fluid in the abdomen. A para-aortic density with central low attenuation measures 2.5 x 2 cm and is significantly decreased in size from prior.    07/16/2018:   CA 125: 28.5    07/30/2018:  C5 weekly taxol 80 mg/m2 D1,8,15, Carboplatin AUC 6 D1 q 28 days. Neulasta Day 16      EXAMS ON EVEN  CYCLES.      CA 125: 11.4    08/27/2018:   C6 weekly taxol 80 mg/m2 D1,8,15, Carboplatin AUC 6 D1 q 28 days. Neulasta Day 16. Order Ct CAP. RTC 10/11 (3 weeks)     EXAMS ON EVEN CYCLES. Discussed option of maintenance parp I- pt not decided (doesn't want to feel tired anymore)     CA 125: 8.7    09/20/2018:   Port removed: While the skin surface appeared inflamed, there was no evidence of infection in the port reservoir.    09/22/2018:   CT Chest: No evidence of thoracic metastatic disease.     Suspected small seroma in the base of the right neck. ??Necrotic metastatic lesion could also have this appearance but considered less likely.     CtAP: Interval decrease in size of a left periaortic necrotic lymph node. No evidence of new metastatic disease.     Lymphatics: Previously demonstrated left para-aortic lymph node with central necrosis currently measures 1.2 cm on series 5 image 45, previously measured 1.8 cm in the short axis.    10/05/2018:   Reviewed scans- aortic node has decreased in size uncertain if active cancer or just scarring. Check PET (to investigate of node is hypermetabolic as well as assess for liver implant previously noted on pet. Offered maintenance with parp I- rubraca 600 mg BID. Pt interested-order placed thru [facility]. RTC BC in a few weeks to start drug and review PET     10/15/2018:   PET: Resolution of the previous hypermetabolic para-aortic lymph nodes and hepatic dome lesions. Hypermetabolic focus within the left thyroid lobe.  Consider further evaluation with ultrasound, if clinically indicated.     NECK: There is a focus of uptake within the left thyroid lobe (axial image 62), with max SUV of 3.3    10/20/2018:  Thyroid US: 1.2 cm irregular hypoechoic nodule in the left thyroid lobe is suspicious. Recommend FNA for further evaluation.     1.0 cm nodule right thyroid lobe containing macrocalcification is indeterminate. Recommend follow-up thyroid ultrasound in one year to evaluate  for stability.     Recommend= FNA for suspicious left thyroid nodule     10/21/2018:   Order thyroid FNA. Pt has not gotten her rubraca yet due to financial issues. RTC2 weeks for PCE#1 (11/12/18)     CA 125:   4.8    11/12/2018:   C1 rubraca 600 mg po BID. RTC 2 weeks for labs (CBC, CMP, 4 weeks for PCE #2). Pt plans to go to [place] and may leave before PCE and re-est care at [facility] (Dr. Gerilyn Pilgrim)     CA 125: 5.5    Disposition: chemo, imaging after 3 cucles, CA 125, possible parp maintenance after, genetic testing neg, foundation one testing results from [facility] in [place]- this was not performed   Current disease status: Neg for CS mutation, no VUS, lifetime remaining breast cancer risk -3.4%  Genetics: negative for mutations, breast cancer lifetime risk 3.4%  Survivorship plan: pending completion of treatment              Past Medical History  She  has a past medical history of Bowel obstruction (CMS Dx) (2018) and Ovarian cancer (CMS Dx) (2014).  Past Medical History:   Diagnosis Date   ??? Bowel obstruction (CMS Dx) 2018   ??? Ovarian cancer (CMS Dx) 2014       Past Surgical History  Past Surgical History:   Procedure Laterality Date   ??? ABDOMINOPLASTY  1999   ??? BLEPHAROPLASTY  2000   ??? DILATION AND CURETTAGE OF UTERUS  1979   ??? ELBOW SURGERY  1990   ??? EYE SURGERY  1994   ??? KNEE ARTHROPLASTY Right    ??? ROTATOR CUFF REPAIR     ??? TRANSUMBILICAL AUGMENTATION MAMMAPLASTY     ??? VAGINAL HYSTERECTOMY  1979       Family History  History reviewed. No pertinent family history.    Family cancer history for ovarian, uterine and colon cancer is negative other than above.       Social History  Social History     Socioeconomic History   ??? Marital status: Divorced     Spouse name: None   ??? Number of children: None   ??? Years of education: None   ??? Highest education level: None   Occupational History   ??? None   Social Needs   ??? Financial resource strain: None   ??? Food insecurity:     Worry: None     Inability: None   ??? Transportation needs:      Medical: None     Non-medical: None   Tobacco Use   ??? Smoking status: Former Smoker     Packs/day: 0.00   ??? Smokeless tobacco: Never Used   Substance and Sexual Activity   ??? Alcohol use: Yes   ??? Drug use: No   ??? Sexual activity: None   Lifestyle   ??? Physical activity:     Days per week: None     Minutes per session: None   ??? Stress: None   Relationships   ??? Social connections:     Talks on phone: None     Gets together: None     Attends religious service: None     Active member of  club or organization: None     Attends meetings of clubs or organizations: None     Relationship status: None   ??? Intimate partner violence:     Fear of current or ex partner: None     Emotionally abused: None     Physically abused: None     Forced sexual activity: None   Other Topics Concern   ??? Caffeine Use No   ??? Occupational Exposure No   ??? Exercise Yes   ??? Seat Belt Yes   Social History Narrative    Mammogram-6 years ago and it was normal. Pt stated she isn't getting them anymore.      Colonoscopy-10 years ago        Pt reports she can lie flat in bed without SOB.    Pt states she can walk a flight of stairs and city block without chest pain and SOB>         Past OB/GYN History  G3P2  She reports menarche at age 23 and menopause at age 21-surgical.  She denies a history of STIs.  She denies a history of abnormal cervical cytology and reports her last cytologic examination she is unsure of. She has taken HRT.     Health maintenance:  Mammogram: Date 6 years ago Results normal  Colonoscopy: Date 10 years ago Results normal    Allergies  She is allergic to bactrim [sulfamethoxazole-trimethoprim]; nsaids (non-steroidal anti-inflammatory drug); celecoxib; and metoclopramide.    BMI  Body mass index is 20.84 kg/m??.    Disease Status: Responding (10/06/2018  1:58 PM)              Histories  She has a past medical history of Bowel obstruction (CMS Dx) (2018) and Ovarian cancer (CMS Dx) (2014).    She has a past surgical history that includes  Dilation and curettage of uterus (1979); Vaginal hysterectomy (1979); Elbow surgery (1990); Eye surgery (1994); Abdominoplasty (1999); Blepharoplasty (2000); Transumbilical augmentation mammaplasty; Rotator cuff repair; and Knee Arthroplasty (Right).    Her family history is not on file.    She reports that she has quit smoking. She smoked 0.00 packs per day. She has never used smokeless tobacco. She reports current alcohol use. She reports that she does not use drugs.    Allergies  Bactrim [sulfamethoxazole-trimethoprim]; Nsaids (non-steroidal anti-inflammatory drug); Celecoxib; and Metoclopramide    Medications  Outpatient Encounter Medications as of 11/12/2018   Medication Sig Dispense Refill   ??? ascorbic acid, vitamin C, (VITAMIN C) 1000 MG tablet Take 1,000 mg by mouth daily.     ??? dexamethasone (DECADRON) 4 MG tablet Take 8 mg by mouth daily with food on day 2, 3, and 4 of chemotherapy cycle 18 tablet 6   ??? digestive enzymes Cap Take by mouth.     ??? ergocalciferol, vitamin D2, (VITAMIN D ORAL) Take 10,000 Units by mouth daily.            ??? GLUTATHIONE-L ORAL Take by mouth daily.     ??? Lactobac no.41/Bifidobact no.7 (PROBIOTIC-10 ORAL) Take 1 capsule by mouth daily. [employer] brand     ??? magnesium oxide (MAG-OX) 400 mg tablet Take 1 tablet (400 mg total) by mouth 3 times a day. Indications: hypomagnesemia 90 tablet 0   ??? MAGNESIUM SULFATE TOP Apply topically daily.     ??? vit b complex w-b 12 (B COMPLEX-VITAMIN B12) tablet Take 1 tablet by mouth daily.     ??? ibuprofen (ADVIL,MOTRIN) 200 MG  tablet Take 200-800 mg by mouth every 8 hours as needed for Pain.     ??? ibuprofen-diphenhydramine HCl 200-25 mg Cap Take 1 tablet by mouth at bedtime as needed.     ??? lidocaine-prilocaine (EMLA) cream Apply topically daily as needed. Apply pea-size amount to port site 30-60 min prior to accessing port 30 g 1   ??? omeprazole (PRILOSEC) 20 MG capsule Take 20 mg by mouth daily.            ??? ondansetron (ZOFRAN) 4 MG tablet  Take 1 tablet (4 mg total) by mouth every 8 hours as needed for Nausea. Must wait to take until 5 days after receiving Aloxi 20 tablet 2   ??? proCHLORPERazine (COMPAZINE) 10 MG tablet Take 1 tablet (10 mg total) by mouth every 6 hours as needed (For nausea and vomiting). 30 tablet 3   ??? rucaparib (RUBRACA) 300 mg Tab Take 2 tablets (600 mg) by mouth 2 times a day. 120 tablet 11   ??? vitamin E 100 UNIT capsule Take 100 Units by mouth daily.       No facility-administered encounter medications on file as of 11/12/2018.         Review of Systems   Constitutional: Positive for fatigue. Negative for activity change, appetite change, chills, fever, weight gain and weight loss.   HENT: Negative for congestion, mouth sores, rhinorrhea, sinus pressure, sore throat and trouble swallowing.    Eyes: Negative for photophobia, redness, itching and visual disturbance.   Respiratory: Negative for apnea, cough, chest tightness and shortness of breath.    Cardiovascular: Negative for chest pain, palpitations and leg swelling.   Gastrointestinal: Negative for abdominal distention, abdominal pain, anal bleeding, bloating, blood in stool, constipation, diarrhea, nausea and vomiting.   Genitourinary: Negative for decreased urine volume, difficulty urinating, dysuria, frequency, genital sores, hematuria, pelvic pain, urgency, vaginal bleeding, vaginal discharge and vaginal pain.   Musculoskeletal: Negative for arthralgias, back pain, joint swelling and neck pain.   Skin: Negative for color change and pallor.   Neurological: Positive for numbness. Negative for dizziness, seizures, weakness and headaches.   Hematological: Negative for adenopathy. Does not bruise/bleed easily.   Psychiatric/Behavioral: Negative for agitation, confusion and depression.       Vitals  Blood pressure 135/68, pulse 76, temperature 97.4 ??F (36.3 ??C), temperature source Temporal, resp. rate 18, height 5' 4 (1.626 m), weight 121 lb 6.4 oz (55.1 kg), SpO2 98  %.    Physical Exam   Vitals reviewed.  Constitutional: She is oriented to person, place, and time. She appears well-developed and well-nourished.   HENT:   Head: Normocephalic and atraumatic.   Eyes: Conjunctivae and EOM are normal.   Neck: Normal range of motion. Neck supple. No thyromegaly present.   Cardiovascular: Normal rate, regular rhythm and normal heart sounds.   Pulmonary/Chest: Effort normal and breath sounds normal. No respiratory distress. She has no wheezes. She has no rales.   Port site removal healing well   Abdominal: Soft. Bowel sounds are normal. She exhibits no distension and no mass. There is no tenderness. There is no rebound and no guarding.   No masses or tenderness.  No hernia palpable.   No hepatosplenomegaly.     Prior scars from xlap, tummy tuck   Genitourinary:    Genitourinary Comments: deferred     Musculoskeletal: Normal range of motion.         General: No edema.     Lymphadenopathy:  She has no cervical adenopathy.   Neurological: She is alert and oriented to person, place, and time.   Skin: Skin is warm and dry.   Psychiatric: She has a normal mood and affect. Her behavior is normal. Judgment and thought content normal.          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       L ovary and fallopian tube- serous adenocarcinoma, high grade, 4 cm, favor  metastasis.     Posterior cul de sac biopsy-metastatic adenocarcinoma.     Omentectomy- metastatic adenocarcinoma     LND-negative     Residual right IP ligament, Anterior cul de sac biopsy, right pelvic side will biopsy, left pelvic side wall, right gutter and left gutter-->benign    04/06/2017:   CT [name]- mild small bowel obstruction with transition point in the left pelvis adjacent to the anterior abdominal wall. Low density in the common femoral veins bilaterally thought likely to represent flow artificat, may consider follow up with lomer extremity Doppler to ensure there is no underlying deep venous thrombosis.    01/29/2018:   CTAP:  Interval development of necrotic retroperitoneal lymphadenopathy with largest node measuring 4.9  4.2 x 4.8 cm. Origin is indeterminate.  Options would include CT directed biopsy as well as PET/CT.    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  No other focal areas of FD avid malignancy are found. Specifically, no lymph node activity is found outside of the retroperitoneum and there is no evidence for FDG avid pulmonary, skeletal or hepatic parenchymal metastasis.    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  She completed 6 cycles of chemotherapy with Carboplatin and Paclitaxel-weekly (C6D1 was 08/27/18). CA 125 was 8.7 at C6. Plan for Rucaparib therapy.     She underwent CT CAP on 10/16/2019n revealed ned in chest and interval decrease in size of a left periaortic necrotic lymph node from 1.8 to 1.6 cm. 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       We previously reviewed her scans which show at least a partial response- the previously noted aortic node has decreased in size from 1.8 cm to 1.2 cm.   PET on 10/15/18 shows reassuring intraabdominal findings with resolution of the previous hypermetabolic para-aortic lymph nodes and hepatic dome lesions, but did note uptake within the thyroid. She underwent thyroid US on 10/20/18 with a 1.2 cm irregular hypoechoic nodule in the left thyroid lobe is suspicious. Recommend FNA for further evaluation. This was ordered but has not been performed yet.  Her CA 125 is normal today (5.5). I continue to recommend maintenance with parp I- rubraca 600 mg BID.     After discussing the above she has elected to proceed with rucaparib 600 mg po BID (28 days cycles) until disease progression.  Kelsey Sullivan  has PS Rec cancer and experience a complete response and presents for cycle 1 of maintenance rucaparin 600 mg po BID.  There is no evidence of progressive disease or excessive toxicity today, and we will continue treatment without dose modification. We will see her back for her next cycle of treatment, and she knows to call if questions or problems arise.     __12/05/2018:C1 rubraca 600 mg po BID. RTC 2 weeks for labs (CBC, CMP, 4 weeks for PCE #2). Pt plans to go to [place] and may leave before  PCE and re-est care at [facility] (Dr. Gerilyn Pilgrim). CA 125: 5.5  __need thyroid FNA- ordered (see below)    Thyroid Nodule:   Incidental finding on PET scan of hypermetabolic focus within left thyroid nodule. Ultrasound performed on 10/20/18 with 1.2 cm irregular hypoechoic nodule, appearing suspicious. Recommend FNA for further evaluation, which was ordered.   __FNA ordered  __referral to ENT made     Genetics:  previously discussed her negative genetic testing results.      Pain  Pain Score: Zero (11/12/2018 10:31 AM)    She denies presence of pain.  Pain will be reassessed at next visit.             I have spent 45 minutes face to face with this patient with greater than 50% of this time spent in counseling and coordination of care discussing the above    Medical Decision Making:  The following items were considered in medical decision making:  Review / order clinical lab tests  Review / order radiology tests  Review / order other diagnostic tests/interventions      Maggie Font, MD, [facility]  Assistant Professor, Obstetrics and Gynecology  Division of Gynecologic Oncology  [phone]

## 2018-11-15 NOTE — Telephone Encounter (Signed)
Patient called and spoke with her she was inquiring about getting dental work done, advised her to wait till her counts level out with starting [place], she said she wasn't planning until March, told her that should be a good time.  She is also going to [place] this winter for some time, and was wanting to see about having the drug shipped to [facility] for her, told her I would have to check with specialty pharmacy about that.

## 2018-11-17 NOTE — Telephone Encounter (Signed)
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  Her biggest complaints are extreme insomnia, nausea after meals, & fatigue.  Patient would like to come in to clinic sooner to discuss.

## 2018-11-18 ENCOUNTER — Ambulatory Visit: Admit: 2018-11-18 | Payer: Medicare (Managed Care)

## 2018-11-18 DIAGNOSIS — C569 Malignant neoplasm of unspecified ovary: Secondary | ICD-10-CM

## 2018-11-18 MED ORDER — ondansetron (ZOFRAN) 4 MG tablet
4 | ORAL_TABLET | Freq: Three times a day (TID) | ORAL | 2 refills | Status: AC | PRN
Start: 2018-11-18 — End: 2020-08-16

## 2018-11-18 MED ORDER — cholecalciferolvitaminD3VITAMIND31000unitstablet
1000 | ORAL_TABLET | Freq: Every day | ORAL | 0 refills | 90.00000 days | Status: AC
Start: 2018-11-18 — End: ?

## 2018-11-18 NOTE — Unmapped (Signed)
History of Present Illness  Kelsey Sullivan is a 77 y.o. female with   Chief Complaint   Patient presents with   ??? 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 She declined adjuvant therapy. She used natural methods, went to a raw diet for a year, etc.  She developed a small bowel obstruction in 03/2017, resolved on its own, with NED at that time.  In 01/2018, she experienced mid abdominal pain, and seen by PCP and ED, with CT of abdomen and pelvis showing interval development of necrotic retroperitoneal lymphadenopathy with largest node measuring 4.9  4.2 x 4.8 cm. Her CA 125 on 02/18/18 was 197.8.  She underwent a ct-guided nodal biopsy on 02/26/18, with pathology confirming recurrent disease.  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 She was seen at [facility] in [place] with Dr. Ishmael Holter. She completed 6 cycles of chemotherapy with Carboplatin and Paclitaxel-weekly (C6D1 was 08/27/18). CA 125 was 8.7 at C6.     She underwent CT CAP on 09/22/2018 revealed ned in chest and interval decrease in size of a left periaortic necrotic lymph node from 1.8 to 1.6 cm. 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 PET on 10/15/18 shows reassuring intraabdominal findings with resolution of  the previous hypermetabolic para-aortic lymph nodes and hepatic dome lesions, but did note uptake within the thyroid. She underwent thyroid US on 10/20/18 with a 1.2 cm irregular hypoechoic nodule in the left thyroid lobe is suspicious. Recommend FNA for further evaluation.    10/15/2018:   PET:  Hypermetabolic focus within the left thyroid lobe.  Consider further evaluation with ultrasound, if clinically indicated.     NECK: There is a focus of uptake within the left thyroid lobe (axial image 62), with max SUV of 3.3    10/20/2018:   Thyroid US: 1.2 cm irregular hypoechoic nodule in the left thyroid lobe is suspicious. Recommend FNA for further evaluation.     1.0 cm nodule right thyroid lobe containing macrocalcification is indeterminate. Recommend follow-up thyroid ultrasound in one year to evaluate for stability.     Recommend= FNA for suspicious left thyroid nodule   The FNA was ordered buy has not been completed yet    She is now on maintenance Rucaparib.      She is seen today for a problem visit to discuss side effects of rubraca. She has been on it less than a week.    Kelsey Sullivan complaints today of insomnia, fatigue during the day, abdominal pressure and nausea after eating. She has not taken her rucaparib last night or today.  She has not tried anything to help with these side effects. Unsure if she wants to continue the drug.  She  denies any bloating or fullness, but does complain of some abdominal pressure.  She reports normal bowel/bladder habits.   She denies any GU concerns. Denies any dysuria, frequency, hematuria, flank pain or fevers.  She denies any vaginal bleeding or discharge.   She has mild neuropathy.   She reports no leg swelling.   Her appetite is good, but reports some nausea after eating, not taking anything for nausea. No vomiting.  Reports not sleeping at night well, and then tired in the day.  Did take Benadryl last night and this helped.      Patient Active Problem List     Diagnosis   ??? Small bowel obstruction (CMS Dx)   ??? H/O small bowel obstruction   ??? 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 Pt declined adjuvant therapy      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     Posterior cul de sac biopsy-metastatic adenocarcinoma.     Omentectomy- metastatic adenocarcinoma     LND-negative     Residual right IP ligament, Anterior cul de sac biopsy, right pelvic side will biopsy, left pelvic side wall, right gutter and left gutter-->benign    04/06/2017:   CT [name]- mild small bowel obstruction with transition point in the left pelvis adjacent to the anterior abdominal wall. Low density in the common femoral veins bilaterally thought likely to represent flow artificat, may consider follow up with lomer extremity Doppler to ensure there is no underlying deep venous thrombosis.    01/29/2018:   CTAP:  Interval development of necrotic retroperitoneal lymphadenopathy with largest node measuring 4.9  4.2 x 4.8 cm. Origin is indeterminate.  Options would include CT directed biopsy as well as PET/CT.    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    03/04/2018:   PET/CT-  FDG avid  retroperitoneal lymphadenopathy as described above and an FDG avid lesion over the dome of the liver most likely a peritoneal implant rather than a liver metastasis.  No other focal areas of FD avid malignancy are found. Specifically, no lymph node activity is found outside of the retroperitoneum and there is no evidence for FDG avid pulmonary, skeletal or hepatic parenchymal metastasis.    02/2018:   Patient saw Dr. Ishmael Holter who recommended Carbo/Taxol with an AUC of 6.  Dr. Maren Reamer office submitted [facility] one testing. Patient would like Genetic Testing    04/12/2018:   IR port placed    04/16/2018:   C1 weekly taxol 80 mg/m2 D1,8,15, Carboplatin AUC 6 D1 q 21 days. PCEs on thurs, txt on fridays. EXAMS ON EVEN CYCLES     CA 125: 523.4  Genetic testing: Neg for CS mutation, no VUS, lifetime remaining breast cancer risk -3.4%    05/07/2018:   C2 weekly taxol 80 mg/m2 D1,8,15, Carboplatin AUC 6 D1 q 21 days. PCEs on thurs, txt on fridays. EXAMS ON EVEN CYCLES     CA 125: 162.2    05/20/2018:   Early PCE due to scheduling, due 6/21    05/28/2018  Delay C3 due to low ANC     CA 125: 25    06/04/2018  C3 weekly taxol 80 mg/m2 D1,8,15, Carboplatin AUC 6 D1 q 21 days. EXAMS ON EVEN CYCLES.      CA 125    06/25/2018:   HOLD chemo due to low anc (1047)- defer to next week, and change to q 28 days cycle to build in an off week to allow for counts to recover     CA 125: 10.6    07/02/2018:   C4 weekly taxol 80 mg/m2 D1,8,15, (missed day 5 due to admission) Carboplatin AUC 6 D1 q 28 days. ANC still lowish 1480s- neulasta Day 16      EXAMS ON EVEN CYCLES.  Review her genetics testing results      CA 125 7.2    07/07/2018:   [facility] admission: SBO, neutropenic fever     CTAP:Small bowel obstruction with transition point in the anterior mid abdomen near the midline. Small amount of free fluid in the abdomen, which is nonspecific. 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     Peritoneum: Small amount of free fluid in the abdomen. A para-aortic density with central low attenuation measures 2.5 x 2 cm and is significantly decreased in size from prior.    07/16/2018:   CA 125: 28.5    07/30/2018:  C5 weekly taxol 80 mg/m2 D1,8,15, Carboplatin AUC 6 D1 q 28 days. Neulasta Day 16      EXAMS ON EVEN CYCLES.      CA 125: 11.4    08/27/2018:   C6 weekly taxol 80 mg/m2 D1,8,15, Carboplatin AUC 6 D1 q 28 days. Neulasta Day 16. Order Ct CAP. RTC 10/11 (3 weeks)     EXAMS ON EVEN CYCLES. Discussed option of maintenance parp I- pt not decided (doesn't want to feel tired anymore)     CA 125: 8.7    09/20/2018:   Port removed: While the skin surface appeared inflamed, there was no evidence of infection in the port reservoir.    09/22/2018:   CT Chest: No evidence of thoracic metastatic disease.     Suspected small seroma in the base of the right neck. ??Necrotic metastatic lesion could also have this appearance but considered less likely.     CtAP: Interval decrease in size of a left periaortic necrotic lymph node. No evidence of new metastatic disease.     Lymphatics: Previously demonstrated left para-aortic lymph node with central necrosis currently measures 1.2 cm on series 5 image 45, previously measured 1.8 cm in the short axis.    10/05/2018:   Reviewed scans- aortic node has decreased in size uncertain if active cancer or just scarring. Check PET (to investigate of node is hypermetabolic as well as assess for liver implant previously noted on pet. Offered maintenance with parp I- rubraca 600 mg BID. Pt interested-order placed thru [facility]. RTC BC in a few weeks to start drug and review PET     10/15/2018:   PET: Resolution of the  previous hypermetabolic para-aortic lymph nodes and hepatic dome lesions. Hypermetabolic focus within the left thyroid lobe.  Consider further evaluation with ultrasound, if clinically indicated.     NECK: There is a focus of uptake within the left thyroid  lobe (axial image 62), with max SUV of 3.3    10/20/2018:   Thyroid US: 1.2 cm irregular hypoechoic nodule in the left thyroid lobe is suspicious. Recommend FNA for further evaluation.     1.0 cm nodule right thyroid lobe containing macrocalcification is indeterminate. Recommend follow-up thyroid ultrasound in one year to evaluate for stability.     Recommend= FNA for suspicious left thyroid nodule     10/21/2018:   Order thyroid FNA. Pt has not gotten her rubraca yet due to financial issues. RTC2 weeks for PCE#1 (11/12/18)     CA 125:   4.8    11/12/2018:   C1 rubraca 600 mg po BID. RTC 2 weeks for labs (CBC, CMP, 4 weeks for PCE #2). Pt plans to go to [place] and may leave before PCE and re-est care at [facility] (Dr. Gerilyn Pilgrim). Referral to ENT placed (pt cancelled appt for 11/22) and FNA was prev ordered for thyroid nodule- but has not been done.      CA 125: 5.5    Disposition: chemo, imaging after 3 cucles, CA 125, possible parp maintenance after, genetic testing neg, [facility] one testing results from [facility] in [place]- this was not performed   Current disease status: Neg for CS mutation, no VUS, lifetime remaining breast cancer risk -3.4%  Genetics: negative for mutations, breast cancer lifetime risk 3.4%  Survivorship plan: pending completion of treatment              Past Medical History  She  has a past medical history of Bowel obstruction (CMS Dx) (2018) and Ovarian cancer (CMS Dx) (2014).  Past Medical History:   Diagnosis Date   ??? Bowel obstruction (CMS Dx) 2018   ??? Ovarian cancer (CMS Dx) 2014       Past Surgical History  Past Surgical History:   Procedure Laterality Date   ??? ABDOMINOPLASTY  1999   ??? BLEPHAROPLASTY  2000   ??? DILATION AND CURETTAGE OF UTERUS  1979   ??? ELBOW SURGERY  1990   ??? EYE SURGERY  1994   ??? KNEE ARTHROPLASTY Right    ??? ROTATOR CUFF REPAIR     ??? TRANSUMBILICAL AUGMENTATION MAMMAPLASTY     ??? VAGINAL HYSTERECTOMY  1979       Family History  History reviewed. No pertinent family history.    Family  cancer history for ovarian, uterine and colon cancer is negative other than above.       Social History  Social History     Socioeconomic History   ??? Marital status: Divorced     Spouse name: None   ??? Number of children: None   ??? Years of education: None   ??? Highest education level: None   Occupational History   ??? None   Social Needs   ??? Financial resource strain: None   ??? Food insecurity:     Worry: None     Inability: None   ??? Transportation needs:     Medical: None     Non-medical: None   Tobacco Use   ??? Smoking status: Former Smoker     Packs/day: 0.00   ??? Smokeless tobacco: Never Used   Substance and Sexual Activity   ??? Alcohol use: Yes   ???  Drug use: No   ??? Sexual activity: None   Lifestyle   ??? Physical activity:     Days per week: None     Minutes per session: None   ??? Stress: None   Relationships   ??? Social connections:     Talks on phone: None     Gets together: None     Attends religious service: None     Active member of club or organization: None     Attends meetings of clubs or organizations: None     Relationship status: None   ??? Intimate partner violence:     Fear of current or ex partner: None     Emotionally abused: None     Physically abused: None     Forced sexual activity: None   Other Topics Concern   ??? Caffeine Use No   ??? Occupational Exposure No   ??? Exercise Yes   ??? Seat Belt Yes   Social History Narrative    Mammogram-6 years ago and it was normal. Pt stated she isn't getting them anymore.      Colonoscopy-10 years ago        Pt reports she can lie flat in bed without SOB.    Pt states she can walk a flight of stairs and city block without chest pain and SOB>         Past OB/GYN History  G3P2  She reports menarche at age 64 and menopause at age 21-surgical.  She denies a history of STIs.  She denies a history of abnormal cervical cytology and reports her last cytologic examination she is unsure of. She has taken HRT.     Health maintenance:  Mammogram: Date 6 years ago Results  normal  Colonoscopy: Date 10 years ago Results normal    Allergies  She is allergic to bactrim [sulfamethoxazole-trimethoprim]; nsaids (non-steroidal anti-inflammatory drug); celecoxib; and metoclopramide.    BMI  Body mass index is 20.77 kg/m??.    Disease Status: Responding (10/06/2018  1:58 PM)              Histories  She has a past medical history of Bowel obstruction (CMS Dx) (2018) and Ovarian cancer (CMS Dx) (2014).    She has a past surgical history that includes Dilation and curettage of uterus (1979); Vaginal hysterectomy (1979); Elbow surgery (1990); Eye surgery (1994); Abdominoplasty (1999); Blepharoplasty (2000); Transumbilical augmentation mammaplasty; Rotator cuff repair; and Knee Arthroplasty (Right).    Her family history is not on file.    She reports that she has quit smoking. She smoked 0.00 packs per day. She has never used smokeless tobacco. She reports current alcohol use. She reports that she does not use drugs.    Allergies  Bactrim [sulfamethoxazole-trimethoprim]; Nsaids (non-steroidal anti-inflammatory drug); Celecoxib; and Metoclopramide    Medications  Outpatient Encounter Medications as of 11/18/2018   Medication Sig Dispense Refill   ??? ascorbic acid, vitamin C, (VITAMIN C) 1000 MG tablet Take 1,000 mg by mouth daily.     ??? digestive enzymes Cap Take by mouth.     ??? GLUTATHIONE-L ORAL Take by mouth daily.     ??? Lactobac no.41/Bifidobact no.7 (PROBIOTIC-10 ORAL) Take 1 capsule by mouth daily. [employer] brand     ??? vit b complex w-b 12 (B COMPLEX-VITAMIN B12) tablet Take 1 tablet by mouth daily.     ??? [DISCONTINUED] dexamethasone (DECADRON) 4 MG tablet Take 8 mg by mouth daily with food on day 2,  3, and 4 of chemotherapy cycle 18 tablet 6   ??? [DISCONTINUED] ergocalciferol, vitamin D2, (VITAMIN D ORAL) Take 10,000 Units by mouth daily.            ??? [DISCONTINUED] magnesium oxide (MAG-OX) 400 mg tablet Take 1 tablet (400 mg total) by mouth 3 times a day. Indications: hypomagnesemia 90  tablet 0   ??? [DISCONTINUED] MAGNESIUM SULFATE TOP Apply topically daily.     ??? lidocaine-prilocaine (EMLA) cream Apply topically daily as needed. Apply pea-size amount to port site 30-60 min prior to accessing port 30 g 1   ??? omeprazole (PRILOSEC) 20 MG capsule Take 20 mg by mouth daily.            ??? ondansetron (ZOFRAN) 4 MG tablet Take 1 tablet (4 mg total) by mouth every 8 hours as needed for Nausea. 20 tablet 2   ??? proCHLORPERazine (COMPAZINE) 10 MG tablet Take 1 tablet (10 mg total) by mouth every 6 hours as needed (For nausea and vomiting). 30 tablet 3   ??? rucaparib (RUBRACA) 300 mg Tab Take 2 tablets (600 mg) by mouth 2 times a day. 120 tablet 11   ??? vitamin E 100 UNIT capsule Take 100 Units by mouth daily.     ??? [DISCONTINUED] ibuprofen (ADVIL,MOTRIN) 200 MG tablet Take 200-800 mg by mouth every 8 hours as needed for Pain.     ??? [DISCONTINUED] ibuprofen-diphenhydramine HCl 200-25 mg Cap Take 1 tablet by mouth at bedtime as needed.     ??? [DISCONTINUED] ondansetron (ZOFRAN) 4 MG tablet Take 1 tablet (4 mg total) by mouth every 8 hours as needed for Nausea. Must wait to take until 5 days after receiving Aloxi 20 tablet 2     No facility-administered encounter medications on file as of 11/18/2018.         Review of Systems   Constitutional: Positive for fatigue. Negative for activity change, appetite change, chills, fever, weight gain and weight loss.   HENT: Negative for congestion, mouth sores, rhinorrhea, sinus pressure, sore throat and trouble swallowing.    Eyes: Negative for photophobia, redness, itching and visual disturbance.   Respiratory: Negative for apnea, cough, chest tightness and shortness of breath.    Cardiovascular: Negative for chest pain, palpitations and leg swelling.   Gastrointestinal: Positive for nausea. Negative for abdominal distention, abdominal pain, anal bleeding, bloating, blood in stool, constipation, diarrhea and vomiting.   Genitourinary: Negative for decreased urine volume,  difficulty urinating, dysuria, frequency, genital sores, hematuria, pelvic pain, urgency, vaginal bleeding, vaginal discharge and vaginal pain.   Musculoskeletal: Negative for arthralgias, back pain, joint swelling and neck pain.   Skin: Negative for color change and pallor.   Neurological: Positive for numbness. Negative for dizziness, seizures, weakness and headaches.   Hematological: Negative for adenopathy. Does not bruise/bleed easily.   Psychiatric/Behavioral: Positive for sleep disturbance. Negative for agitation, confusion and depression.       Vitals  Blood pressure 120/56, pulse 86, height 5' 4 (1.626 m), weight 121 lb (54.9 kg), SpO2 97 %.    Physical Exam   Vitals reviewed.  Constitutional: She is oriented to person, place, and time. She appears well-developed and well-nourished.   HENT:   Head: Normocephalic and atraumatic.   Eyes: Conjunctivae and EOM are normal.   Neck: Normal range of motion. Neck supple. No thyromegaly present.   Cardiovascular: Normal rate, regular rhythm and normal heart sounds.   Pulmonary/Chest: Effort normal and breath sounds normal. No respiratory distress. She  has no wheezes. She has no rales.   Port site removal healing well   Abdominal: Soft. Bowel sounds are normal. She exhibits no distension and no mass. There is no tenderness. There is no rebound and no guarding.   No masses or tenderness.  No hernia palpable.   No hepatosplenomegaly.     Prior scars from xlap, tummy tuck   Genitourinary:    Genitourinary Comments: deferred     Musculoskeletal: Normal range of motion.         General: No edema.     Lymphadenopathy:     She has no cervical adenopathy.   Neurological: She is alert and oriented to person, place, and time.   Skin: Skin is warm and dry.   Psychiatric: She has a normal mood and affect. Her behavior is normal. Judgment and thought content normal.          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     Posterior cul de sac biopsy-metastatic adenocarcinoma.     Omentectomy- metastatic adenocarcinoma     LND-negative     Residual right IP ligament, Anterior cul de sac biopsy, right pelvic side will biopsy, left pelvic side wall, right gutter and left gutter-->benign    04/06/2017:   CT [name]- mild small bowel obstruction with transition point in the left pelvis adjacent to the anterior abdominal wall. Low density in the common femoral veins bilaterally thought likely to represent flow artificat, may consider follow up with lomer extremity Doppler to ensure there is no underlying deep venous thrombosis.    01/29/2018:   CTAP:  Interval development of necrotic retroperitoneal lymphadenopathy with largest node measuring 4.9  4.2 x 4.8 cm. Origin is indeterminate.  Options would include CT directed biopsy as well as PET/CT.    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    03/04/2018:   PET/CT-  FDG avid retroperitoneal lymphadenopathy as described above and an FDG avid lesion over the dome of the liver most likely a peritoneal  implant rather than a liver metastasis.  No other focal areas of FD avid malignancy are found. Specifically, no lymph node activity is found outside of the retroperitoneum and there is no evidence for FDG avid pulmonary, skeletal or hepatic parenchymal metastasis.    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 She completed 6 cycles of chemotherapy with Carboplatin and Paclitaxel-weekly (C6D1 was 08/27/18). CA 125 was 8.7 at C6. Plan for Rucaparib therapy.     She underwent CT CAP on 10/16/2019n revealed ned in chest and interval decrease in size of a left periaortic necrotic lymph node from 1.8 to 1.6 cm. 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       We previously reviewed her scans which show at least a partial response- the previously noted aortic node has decreased in size from 1.8 cm to 1.2 cm.   PET on 10/15/18 shows reassuring intraabdominal findings with resolution of the previous hypermetabolic para-aortic lymph nodes and hepatic dome lesions, but did note uptake within the thyroid. She underwent thyroid US on 10/20/18 with a 1.2 cm irregular hypoechoic nodule in the left thyroid lobe is suspicious. Recommend FNA for further evaluation. This was ordered but has not been performed yet.  Her CA 125 is normal today (5.5). I continue to recommend maintenance with parp I- rubraca 600 mg BID.      After discussing the above she has elected to proceed with rucaparib 600 mg po BID (28 days cycles) until disease progression.  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    She complains today of side effects that are bothersome to include insomnia, fatigue and nausea.  We discussed that these are common side effects of the drug she is on and generally improve over time as her body gets used to the medication, but options for management include supportive medications to improve side effects vs stopping medication if not willing to continue and try to improve side effects.  For supportive meds, discussed recommendation for nausea control to include Zofran three times a day, 30 min before morning dose of PARP inhibitor, and 30 min before meals and Pepcid complete 2 x a day or prilosec. For insomnia, benadryl at night and see if it improves fatigue during the day.  Of note, she is not interested in future if needed to try Ritalin.  We discussed that if despite these medications her symptoms are still not controlled, options include dose reduction or stopping medication. She has agreed to try these supportive measures for the next week and will let us know how she is feeling at that time and if any further adjustments are needed or if she has decided to stop the medication. If otherwise doing well, we will see her back for her next cycle of treatment, and she knows to call if questions or problems arise.     __12/05/2018:C1 rubraca 600 mg po BID. RTC 2 weeks for labs (CBC, CMP, 4 weeks for PCE #2). Pt plans to go to [place] and  may leave before PCE and re-est care at [facility] (Dr. Gerilyn Pilgrim). CA 125: 5.5  __need thyroid FNA- ordered (see below)  __For supportive meds, discussed recommendation for nausea control to include Zofran three times a day, 30 min before morning dose of PARP inhibitor, and 30 min before  meals and Pepcid complete 2 x a day or prilosec. For insomnia, benadryl at night and see if it improves fatigue during the day.  Of note, she is not interested in future if needed to try Ritalin.     Thyroid Nodule:   Incidental finding on PET scan of hypermetabolic focus within left thyroid nodule. Ultrasound performed on 10/20/18 with 1.2 cm irregular hypoechoic nodule, appearing suspicious. Recommend FNA for further evaluation, which was ordered.   __FNA ordered  __referral to ENT made     Genetics:  previously discussed her negative genetic testing results.      Pain  Pain Score: Zero (11/18/2018  9:30 AM)    She denies presence of pain.  Pain will be reassessed at next visit.       Hedda Slade, CNP  Division of Gynecologic Oncology  [phone]    I saw and personally examined the patient today with my CNP [place]. I discussed the findings and therapeutic plan with the patient. I repeated, reviewed and agree with the history of present illness, past medical histories, family history, social history, medication list, and allergies as listed. The review of systems is as noted above. My physical exam confirms the findings listed above. Review of labs, pathology reports, radiograph reports, and medical records confirm the findings noted above. I agree with the assessment and plan as noted above. I have edited the note where appropriate.     I have personally performed a face to face diagnostic evaluation on this patient, seen initially by the CNP. I have personally developed the care plan as outlined in the Assessment and Plan.      Complexity: mod     I spent a total of 25 minutes face-to-face of which >50% was spent in counseling and/or coordination of care, documentation and counseling.with patient and/or family.      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      Maggie Font, MD  Gynecology  Oncology  [phone]      Medical Decision Making:  The following items were considered in medical decision making:  Review / order clinical lab tests  Review / order radiology tests  Review / order other diagnostic tests/interventions

## 2018-11-18 NOTE — Patient Instructions (Signed)
1. Nausea: Take Zofran three times a day, 30 min before morning dose of PARP inhibitor, and 30 min before meals  -Take Pepcid complete 2 x a day or prilosec    2. Insomnia: Take benadryl at night

## 2018-11-25 MED FILL — RUCAPARIB 300 MG TABLET: 300 300 mg | ORAL | 30 days supply | Qty: 120 | Fill #1

## 2018-11-26 ENCOUNTER — Other Ambulatory Visit: Admit: 2018-11-26 | Payer: Medicare (Managed Care)

## 2018-11-26 DIAGNOSIS — C569 Malignant neoplasm of unspecified ovary: Secondary | ICD-10-CM

## 2018-11-26 LAB — COMPREHENSIVE METABOLIC PANEL
ALT: 26 U/L (ref 7–52)
AST: 36 U/L (ref 13–39)
Albumin: 4.2 g/dL (ref 3.5–5.7)
Alkaline Phosphatase: 96 U/L (ref 36–125)
Anion Gap: 8 mmol/L (ref 3–16)
BUN: 14 mg/dL (ref 7–25)
CO2: 29 mmol/L (ref 21–33)
Calcium: 9.6 mg/dL (ref 8.6–10.3)
Chloride: 96 mmol/L — ABNORMAL LOW (ref 98–110)
Creatinine: 0.89 mg/dL (ref 0.60–1.30)
Glucose: 166 mg/dL — ABNORMAL HIGH (ref 70–100)
Osmolality, Calculated: 280 mosm/kg (ref 278–305)
Potassium: 4.5 mmol/L (ref 3.5–5.3)
Sodium: 133 mmol/L (ref 133–146)
Total Bilirubin: 0.6 mg/dL (ref 0.0–1.5)
Total Protein: 7 g/dL (ref 6.4–8.9)
eGFR AA CKD-EPI: 72 See note.
eGFR NONAA CKD-EPI: 63 See note.

## 2018-11-26 LAB — DIFFERENTIAL
Basophils Absolute: 31 /uL (ref 0–200)
Basophils Relative: 0.6 % (ref 0.0–1.0)
Eosinophils Absolute: 20 /uL (ref 15–500)
Eosinophils Relative: 0.4 % (ref 0.0–8.0)
Lymphocytes Absolute: 954 /uL (ref 850–3900)
Lymphocytes Relative: 18.7 % (ref 15.0–45.0)
Monocytes Absolute: 383 /uL (ref 200–950)
Monocytes Relative: 7.5 % (ref 0.0–12.0)
Neutrophils Absolute: 3713 /uL (ref 1500–7800)
Neutrophils Relative: 72.8 % (ref 40.0–80.0)
nRBC: 0 /100{WBCs} (ref 0–0)

## 2018-11-26 LAB — CBC
Hematocrit: 34.3 % — ABNORMAL LOW (ref 35.0–45.0)
Hemoglobin: 11.4 g/dL — ABNORMAL LOW (ref 11.7–15.5)
MCH: 34 pg — ABNORMAL HIGH (ref 27.0–33.0)
MCHC: 33.4 g/dL (ref 32.0–36.0)
MCV: 102 fL — ABNORMAL HIGH (ref 80.0–100.0)
MPV: 7.5 fL (ref 7.5–11.5)
Platelets: 192 10E3/uL (ref 140–400)
RBC: 3.36 10E6/uL — ABNORMAL LOW (ref 3.80–5.10)
RDW: 12.4 % (ref 11.0–15.0)
WBC: 5.1 10E3/uL (ref 3.8–10.8)

## 2018-11-26 NOTE — Unmapped (Signed)
-----   Message from [place], [place] sent at 11/26/2018  2:12 PM EST -----  Pt stated that she was told to call to report how she's been feeling on her medication. Pt is requesting a call back when possible.

## 2018-11-26 NOTE — Telephone Encounter (Signed)
RTC to patient who stated she has been taking the rubraca, with the pepcid and zofran and benedryl, and she admitted that her symptoms have improved.  Only complaint now was that she is sleeping too much.  Told her if she is taking the 25mg  tablets of benedryl she could cut them in half and try just taking 12.5 mg.  Otherwise she stated that she is doing better, let her know I would pass it along to Dr. [name].

## 2018-12-06 NOTE — Telephone Encounter (Signed)
Notes were made in her chart, patient has follow-up apt on Friday 1/3

## 2018-12-06 NOTE — Telephone Encounter (Signed)
Pt called to let us know that she stopped taking her Rubraca on Friday evening, states that it was making her sick.

## 2018-12-10 ENCOUNTER — Ambulatory Visit: Admit: 2018-12-10 | Payer: Medicare (Managed Care)

## 2018-12-10 ENCOUNTER — Other Ambulatory Visit: Admit: 2018-12-10 | Payer: Medicare (Managed Care)

## 2018-12-10 ENCOUNTER — Ambulatory Visit: Payer: Medicare (Managed Care)

## 2018-12-10 DIAGNOSIS — C569 Malignant neoplasm of unspecified ovary: Secondary | ICD-10-CM

## 2018-12-10 LAB — COMPREHENSIVE METABOLIC PANEL
ALT: 22 U/L (ref 7–52)
AST: 21 U/L (ref 13–39)
Albumin: 4.3 g/dL (ref 3.5–5.7)
Alkaline Phosphatase: 92 U/L (ref 36–125)
Anion Gap: 9 mmol/L (ref 3–16)
BUN: 18 mg/dL (ref 7–25)
CO2: 28 mmol/L (ref 21–33)
Calcium: 9.4 mg/dL (ref 8.6–10.3)
Chloride: 96 mmol/L (ref 98–110)
Creatinine: 0.8 mg/dL (ref 0.60–1.30)
Glucose: 121 mg/dL (ref 70–100)
Osmolality, Calculated: 279 mOsm/kg (ref 278–305)
Potassium: 4.5 mmol/L (ref 3.5–5.3)
Sodium: 133 mmol/L (ref 133–146)
Total Bilirubin: 0.4 mg/dL (ref 0.0–1.5)
Total Protein: 7 g/dL (ref 6.4–8.9)
eGFR AA CKD-EPI: 82 See note.
eGFR NONAA CKD-EPI: 71 See note.

## 2018-12-10 LAB — DIFFERENTIAL
Basophils Absolute: 32 /uL (ref 0–200)
Basophils Relative: 0.8 % (ref 0.0–1.0)
Eosinophils Absolute: 28 /uL (ref 15–500)
Eosinophils Relative: 0.7 % (ref 0.0–8.0)
Lymphocytes Absolute: 1104 /uL (ref 850–3900)
Lymphocytes Relative: 27.6 % (ref 15.0–45.0)
Monocytes Absolute: 324 /uL (ref 200–950)
Monocytes Relative: 8.1 % (ref 0.0–12.0)
Neutrophils Absolute: 2512 /uL (ref 1500–7800)
Neutrophils Relative: 62.8 % (ref 40.0–80.0)
nRBC: 0 /100 WBC (ref 0–0)

## 2018-12-10 LAB — CBC
Hematocrit: 33.6 % (ref 35.0–45.0)
Hemoglobin: 11.4 g/dL (ref 11.7–15.5)
MCH: 33.7 pg (ref 27.0–33.0)
MCHC: 33.8 g/dL (ref 32.0–36.0)
MCV: 99.8 fL (ref 80.0–100.0)
MPV: 7.5 fL (ref 7.5–11.5)
Platelets: 223 10*3/uL (ref 140–400)
RBC: 3.37 10*6/uL (ref 3.80–5.10)
RDW: 12.9 % (ref 11.0–15.0)
WBC: 4 10*3/uL (ref 3.8–10.8)

## 2018-12-10 LAB — MAGNESIUM: Magnesium: 1.8 mg/dL (ref 1.5–2.5)

## 2018-12-10 LAB — CA 125: CA 125: 5.8 U/mL (ref 5.5–35.0)

## 2018-12-10 MED ORDER — rucaparib (RUBRACA) 200 mg Tab
200 | ORAL_TABLET | Freq: Two times a day (BID) | ORAL | 11 refills | Status: AC
Start: 2018-12-10 — End: 2020-08-16

## 2018-12-10 MED ORDER — rucaparib (RUBRACA) 300 mg Tab
300 | ORAL_TABLET | Freq: Two times a day (BID) | ORAL | 11 refills | Status: AC
Start: 2018-12-10 — End: 2020-08-16
  Filled 2018-12-14: qty 60, 30d supply, fill #0

## 2018-12-10 MED ORDER — rucaparib (RUBRACA) 300 mg Tab
300 | ORAL_TABLET | Freq: Two times a day (BID) | ORAL | 11 refills | Status: AC
Start: 2018-12-10 — End: 2018-12-10

## 2018-12-10 NOTE — Unmapped (Signed)
RTC to patient let her know that Dr. [name] will order her the dose reduction, however she has to find a physician to be monitoring her labs every 2 weeks.  She verbalized understanding, and stated she would find someone.

## 2018-12-10 NOTE — Unmapped (Signed)
History of Present Illness  Kelsey Sullivan is a 78 y.o. female with   Chief Complaint   Patient presents with   ??? 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 She declined adjuvant therapy. She used natural methods, went to a raw diet for a year, etc.  She developed a small bowel obstruction in 03/2017, resolved on its own, with NED at that time.  In 01/2018, she experienced mid abdominal pain, and seen by PCP and ED, with CT of abdomen and pelvis showing interval development of necrotic retroperitoneal lymphadenopathy with largest node measuring 4.9  4.2 x 4.8 cm. Her CA 125 on 02/18/18 was 197.8.  She underwent a ct-guided nodal biopsy on 02/26/18, with pathology confirming recurrent disease.  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 She was seen at [facility] in [place] with Dr. Ishmael Holter. She completed 6 cycles of chemotherapy with Carboplatin and Paclitaxel-weekly (C6D1 was 08/27/18). CA 125 was 8.7 at C6.     She underwent CT CAP on 09/22/2018 revealed ned in chest and interval decrease in size of a left periaortic necrotic lymph node from 1.8 to 1.6 cm. 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 PET on 10/15/18 shows  reassuring intraabdominal findings with resolution of the previous hypermetabolic para-aortic lymph nodes and hepatic dome lesions, but did note uptake within the thyroid. She underwent thyroid US on 10/20/18 with a 1.2 cm irregular hypoechoic nodule in the left thyroid lobe is suspicious. Recommend FNA for further evaluation.    10/15/2018:   PET:  Hypermetabolic focus within the left thyroid lobe.  Consider further evaluation with ultrasound, if clinically indicated.     NECK: There is a focus of uptake within the left thyroid lobe (axial image 62), with max SUV of 3.3    10/20/2018:   Thyroid US: 1.2 cm irregular hypoechoic nodule in the left thyroid lobe is suspicious. Recommend FNA for further evaluation.     1.0 cm nodule right thyroid lobe containing macrocalcification is indeterminate. Recommend follow-up thyroid ultrasound in one year to evaluate for stability.     Recommend= FNA for suspicious left thyroid nodule   The FNA was ordered buy has not been completed yet    She is now on maintenance Rucaparib.    She is seen today for follow up on rubraca. She has called several times since her last visit as detailed below:     11/26/18: Pt calls to report she is doing better on above meds. Only complaint is sleeping too much. Advised she can cut the benadryl dose in half.     12/03/2018: Pt self d/c'd rubraca due to side  effects (decreased energy mainly, some nausea)    12/10/2018: Pt desires to start again on lower dose- rx to speciality pharmacy for rubraca 500 mg po BID. Pt headed to [facility]- explained she needs to have labs checked q 2 weeks initially and to be followed by oncologist       [name] reports she stopped her drug due to fatigue and nausea. The antiemetics did help- but she still felt a little queasy. So she stopped. Now she wants to consider restarting at a lower dose. She is about to leave for [place]- and doesn't want to restart until she gets down there.   She denies any bloating or  fullness  She reports normal bowel/bladder habits.   She denies any GU concerns. Denies any dysuria, frequency, hematuria, flank pain or fevers.  She denies any vaginal bleeding or discharge.   She has mild neuropathy.   She reports no leg swelling.   Her appetite is good, nausea is less off meds.  No vomiting.  Reports sleeping better w benadryl, but says she can't sleep on her left side bc she can hear her heart beating and it keeps her awake.       Patient Active Problem List    Diagnosis   ??? Small bowel obstruction (CMS Dx)   ??? H/O small bowel obstruction   ??? 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 Pt declined adjuvant therapy      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     Posterior cul de sac biopsy-metastatic adenocarcinoma.     Omentectomy- metastatic adenocarcinoma     LND-negative     Residual right IP ligament, Anterior cul de sac biopsy, right pelvic side will biopsy, left pelvic side wall, right gutter and left gutter-->benign    04/06/2017:   CT [name]- mild small bowel obstruction with transition point in the left pelvis adjacent to the anterior abdominal wall. Low density in the common femoral veins bilaterally thought likely to represent flow artificat, may consider follow up with lomer extremity Doppler to ensure there is no underlying deep venous thrombosis.    01/29/2018:   CTAP:  Interval development of necrotic retroperitoneal  lymphadenopathy with largest node measuring 4.9  4.2 x 4.8 cm. Origin is indeterminate.  Options would include CT directed biopsy as well as PET/CT.    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  No other focal areas of FD avid  malignancy are found. Specifically, no lymph node activity is found outside of the retroperitoneum and there is no evidence for FDG avid pulmonary, skeletal or hepatic parenchymal metastasis.    02/2018:   Patient saw Dr. Ishmael Holter who recommended Carbo/Taxol with an AUC of 6.  Dr. Maren Reamer office submitted foundation one testing. Patient would like Genetic Testing    04/12/2018:   IR port placed    04/16/2018:   C1 weekly taxol 80 mg/m2 D1,8,15, Carboplatin AUC 6 D1 q 21 days. PCEs on thurs, txt on fridays. EXAMS ON EVEN CYCLES     CA 125: 523.4     Genetic testing: Neg for CS mutation, no VUS, lifetime remaining breast cancer risk -3.4%    05/07/2018:   C2 weekly taxol 80 mg/m2 D1,8,15, Carboplatin AUC 6 D1 q 21 days. PCEs on thurs, txt on fridays. EXAMS ON EVEN CYCLES     CA 125: 162.2    05/20/2018:   Early PCE due to scheduling, due 6/21    05/28/2018  Delay C3 due to low ANC     CA 125: 25    06/04/2018  C3 weekly taxol 80 mg/m2 D1,8,15, Carboplatin AUC 6 D1 q 21 days. EXAMS ON EVEN CYCLES.      CA 125    06/25/2018:   HOLD chemo due to low anc (1047)- defer to next week, and change to q 28 days cycle to build in an off week to allow for counts to recover     CA 125: 10.6    07/02/2018:   C4 weekly taxol 80 mg/m2 D1,8,15, (missed day 5 due to admission) Carboplatin  AUC 6 D1 q 28 days. ANC still lowish 1480s- neulasta Day 16      EXAMS ON EVEN CYCLES.  Review her genetics testing results      CA 125 7.2    07/07/2018:   [facility] admission: SBO, neutropenic fever     CTAP:Small bowel obstruction with transition point in the anterior mid abdomen near the midline. Small amount of free fluid in the abdomen, which is nonspecific. 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     Peritoneum: Small amount of free fluid in the abdomen. A para-aortic density with central low attenuation measures 2.5 x 2 cm and is significantly decreased in size from prior.    07/16/2018:   CA 125: 28.5    07/30/2018:  C5 weekly taxol 80 mg/m2 D1,8,15, Carboplatin AUC 6 D1 q 28 days. Neulasta Day 16      EXAMS ON EVEN CYCLES.      CA 125: 11.4    08/27/2018:   C6 weekly taxol 80 mg/m2 D1,8,15, Carboplatin AUC 6 D1 q 28 days. Neulasta Day 16. Order Ct CAP. RTC 10/11 (3 weeks)     EXAMS ON EVEN CYCLES. Discussed option of maintenance parp I- pt not decided (doesn't want to feel tired anymore)     CA 125: 8.7    09/20/2018:   Port removed: While the skin surface appeared inflamed, there was no evidence of infection in the port reservoir.    09/22/2018:   CT Chest: No evidence of thoracic metastatic disease.     Suspected small seroma in the base of the right neck. ??Necrotic metastatic lesion could also have this appearance but considered less likely.     CtAP: Interval decrease in size of a left periaortic necrotic lymph node. No evidence of new metastatic disease.  Lymphatics: Previously demonstrated left para-aortic lymph node with central necrosis currently measures 1.2 cm on series 5 image 45, previously measured 1.8 cm in the short axis.    10/05/2018:   Reviewed scans- aortic node has decreased in size uncertain if active cancer or just scarring. Check PET (to investigate of node is hypermetabolic as well as assess for liver implant  previously noted on pet. Offered maintenance with parp I- rubraca 600 mg BID. Pt interested-order placed thru spec pharmacy. RTC BC in a few weeks to start drug and review PET     10/15/2018:   PET: Resolution of the previous hypermetabolic para-aortic lymph nodes and hepatic dome lesions. Hypermetabolic focus within the left thyroid lobe.  Consider further evaluation with ultrasound, if clinically indicated.     NECK: There is a focus of uptake within the left thyroid lobe (axial image 62), with max SUV of 3.3    10/20/2018:   Thyroid US: 1.2 cm irregular hypoechoic nodule in the left thyroid lobe is suspicious. Recommend FNA for further evaluation.     1.0 cm nodule right thyroid lobe containing macrocalcification is indeterminate. Recommend follow-up thyroid ultrasound in one year to evaluate for stability.     Recommend= FNA for suspicious left thyroid nodule     10/21/2018:   Order thyroid FNA. Pt has not gotten her rubraca yet due to financial issues. RTC2 weeks for PCE#1 (11/12/18)     CA 125:   4.8    11/12/2018:   C1 rubraca 600 mg po BID. RTC 2 weeks for labs (CBC, CMP, 4 weeks for PCE #2). Pt plans to go to [place] and may leave before PCE and re-est care at [facility] (Dr. Gerilyn Pilgrim). Referral to ENT placed (pt cancelled appt for 11/22) and FNA was prev ordered for thyroid nodule- but has not been done.      CA 125: 5.5    11/18/2018:   Prob visit for symptoms from rubraca:For supportive meds, discussed recommendation for nausea control to include Zofran three times a day, 30 min before morning dose of PARP inhibitor, and 30 min before meals and Pepcid complete 2 x a day or prilosec. For insomnia, benadryl at night and see if it improves fatigue during the day.  Of note, she is not interested in future if needed to try Ritalin. Pt to call in 1 week to review symptoms    11/26/18:   Pt calls to report she is doing better on above meds. Only complaint is sleeping too much. Advised she can cut the benadryl dose in  half.     12/03/2018:   Pt self d/c'd rubraca due to side effects (decreased energy mainly, some nausea)    12/10/2018:   Pt desires to start again on lower dose- rx to speciality pharmacy for rubraca 500 mg po BID. Pt headed to [facility]- explained she needs to have labs checked q 2 weeks initially and to be followed by oncologist     Disposition: chemo, imaging after 3 cucles, CA 125, possible parp maintenance after, genetic testing neg, foundation one testing results from [facility] in [place]- this was not performed   Current disease status: Neg for CS mutation, no VUS, lifetime remaining breast cancer risk -3.4%  Genetics: negative for mutations, breast cancer lifetime risk 3.4%  Survivorship plan: pending completion of treatment              Past Medical History  She  has a past medical history of Bowel obstruction (CMS Dx) (  2018) and Ovarian cancer (CMS Dx) (2014).  Past Medical History:   Diagnosis Date   ??? Bowel obstruction (CMS Dx) 2018   ??? Ovarian cancer (CMS Dx) 2014       Past Surgical History  Past Surgical History:   Procedure Laterality Date   ??? ABDOMINOPLASTY  1999   ??? BLEPHAROPLASTY  2000   ??? DILATION AND CURETTAGE OF UTERUS  1979   ??? ELBOW SURGERY  1990   ??? EYE SURGERY  1994   ??? KNEE ARTHROPLASTY Right    ??? ROTATOR CUFF REPAIR     ??? TRANSUMBILICAL AUGMENTATION MAMMAPLASTY     ??? VAGINAL HYSTERECTOMY  1979       Family History  History reviewed. No pertinent family history.    Family cancer history for ovarian, uterine and colon cancer is negative other than above.       Social History  Social History     Socioeconomic History   ??? Marital status: Divorced     Spouse name: None   ??? Number of children: None   ??? Years of education: None   ??? Highest education level: None   Occupational History   ??? None   Social Needs   ??? Financial resource strain: None   ??? Food insecurity:     Worry: None     Inability: None   ??? Transportation needs:     Medical: None     Non-medical: None   Tobacco Use   ??? Smoking status: Former Smoker      Packs/day: 0.00   ??? Smokeless tobacco: Never Used   Substance and Sexual Activity   ??? Alcohol use: Yes   ??? Drug use: No   ??? Sexual activity: None   Lifestyle   ??? Physical activity:     Days per week: None     Minutes per session: None   ??? Stress: None   Relationships   ??? Social connections:     Talks on phone: None     Gets together: None     Attends religious service: None     Active member of club or organization: None     Attends meetings of clubs or organizations: None     Relationship status: None   ??? Intimate partner violence:     Fear of current or ex partner: None     Emotionally abused: None     Physically abused: None     Forced sexual activity: None   Other Topics Concern   ??? Caffeine Use No   ??? Occupational Exposure No   ??? Exercise Yes   ??? Seat Belt Yes   Social History Narrative    Mammogram-6 years ago and it was normal. Pt stated she isn't getting them anymore.      Colonoscopy-10 years ago        Pt reports she can lie flat in bed without SOB.    Pt states she can walk a flight of stairs and city block without chest pain and SOB>         Past OB/GYN History  G3P2  She reports menarche at age 22 and menopause at age 62-surgical.  She denies a history of STIs.  She denies a history of abnormal cervical cytology and reports her last cytologic examination she is unsure of. She has taken HRT.     Health maintenance:  Mammogram: Date 6 years ago Results normal  Colonoscopy: Date 10 years ago Results normal  Allergies  She is allergic to bactrim [sulfamethoxazole-trimethoprim]; nsaids (non-steroidal anti-inflammatory drug); celecoxib; and metoclopramide.    BMI  Body mass index is 20.84 kg/m??.    Disease Status: Responding (10/06/2018  1:58 PM)              Histories  She has a past medical history of Bowel obstruction (CMS Dx) (2018) and Ovarian cancer (CMS Dx) (2014).    She has a past surgical history that includes Dilation and curettage of uterus (1979); Vaginal hysterectomy (1979); Elbow surgery  (1990); Eye surgery (1994); Abdominoplasty (1999); Blepharoplasty (2000); Transumbilical augmentation mammaplasty; Rotator cuff repair; and Knee Arthroplasty (Right).    Her family history is not on file.    She reports that she has quit smoking. She smoked 0.00 packs per day. She has never used smokeless tobacco. She reports current alcohol use. She reports that she does not use drugs.    Allergies  Bactrim [sulfamethoxazole-trimethoprim]; Nsaids (non-steroidal anti-inflammatory drug); Celecoxib; and Metoclopramide    Medications  Outpatient Encounter Medications as of 12/10/2018   Medication Sig Dispense Refill   ??? ascorbic acid, vitamin C, (VITAMIN C) 1000 MG tablet Take 1,000 mg by mouth daily.     ??? cholecalciferol, vitamin D3, (VITAMIN D3) 1000 units tablet Take 1 tablet (1,000 Units total) by mouth daily. 30 tablet 0   ??? digestive enzymes Cap Take by mouth.     ??? GLUTATHIONE-L ORAL Take by mouth daily.     ??? iodine-sodium iodide 2 % solution Apply topically if needed.     ??? Lactobac no.41/Bifidobact no.7 (PROBIOTIC-10 ORAL) Take 1 capsule by mouth daily. [employer] brand     ??? ondansetron (ZOFRAN) 4 MG tablet Take 1 tablet (4 mg total) by mouth every 8 hours as needed for Nausea. 20 tablet 2   ??? UNABLE TO FIND multimineral     ??? vit b complex w-b 12 (B COMPLEX-VITAMIN B12) tablet Take 1 tablet by mouth daily.     ??? chlorhexidine (PERIDEX) 0.12 % solution      ??? lidocaine-prilocaine (EMLA) cream Apply topically daily as needed. Apply pea-size amount to port site 30-60 min prior to accessing port 30 g 1   ??? omeprazole (PRILOSEC) 20 MG capsule Take 20 mg by mouth daily.            ??? proCHLORPERazine (COMPAZINE) 10 MG tablet Take 1 tablet (10 mg total) by mouth every 6 hours as needed (For nausea and vomiting). 30 tablet 3   ??? vitamin E 100 UNIT capsule Take 100 Units by mouth daily.     ??? [DISCONTINUED] rucaparib (RUBRACA) 300 mg Tab Take 2 tablets (600 mg) by mouth 2 times a day. 120 tablet 11   ???  [DISCONTINUED] rucaparib (RUBRACA) 300 mg Tab Take 500 mg by mouth 2 times a day. Indications: malignant neoplasm of the ovary 120 tablet 11     No facility-administered encounter medications on file as of 12/10/2018.         Review of Systems   Constitutional: Positive for fatigue. Negative for activity change, appetite change, chills, fever, weight gain and weight loss.   HENT: Negative for congestion, mouth sores, rhinorrhea, sinus pressure, sore throat and trouble swallowing.    Eyes: Negative for photophobia, redness, itching and visual disturbance.   Respiratory: Negative for apnea, cough, chest tightness and shortness of breath.    Cardiovascular: Negative for chest pain, palpitations and leg swelling.   Gastrointestinal: Positive for nausea. Negative for abdominal  distention, abdominal pain, anal bleeding, bloating, blood in stool, constipation, diarrhea and vomiting.   Genitourinary: Negative for decreased urine volume, difficulty urinating, dysuria, frequency, genital sores, hematuria, pelvic pain, urgency, vaginal bleeding, vaginal discharge and vaginal pain.   Musculoskeletal: Negative for arthralgias, back pain, joint swelling and neck pain.   Skin: Negative for color change and pallor.   Neurological: Positive for numbness. Negative for dizziness, seizures, weakness and headaches.   Hematological: Negative for adenopathy. Does not bruise/bleed easily.   Psychiatric/Behavioral: Positive for sleep disturbance. Negative for agitation, confusion and depression.       Vitals  Blood pressure 112/60, pulse 80, temperature 97.6 ??F (36.4 ??C), temperature source Temporal, resp. rate 18, height 5' 4 (1.626 m), weight 121 lb 6.4 oz (55.1 kg), SpO2 100 %.    Physical Exam   Vitals reviewed.  Constitutional: She is oriented to person, place, and time. She appears well-developed and well-nourished.   HENT:   Head: Normocephalic and atraumatic.   Eyes: Conjunctivae and EOM are normal.   Neck: Normal range of motion.  Neck supple. No thyromegaly present.   Cardiovascular: Normal rate, regular rhythm and normal heart sounds.   Pulmonary/Chest: Effort normal and breath sounds normal. No respiratory distress. She has no wheezes. She has no rales.   Port site removal healing well   Abdominal: Soft. Bowel sounds are normal. She exhibits no distension and no mass. There is no tenderness. There is no rebound and no guarding.   def   Genitourinary:    Genitourinary Comments: deferred     Musculoskeletal: Normal range of motion.         General: No edema.     Lymphadenopathy:     She has no cervical adenopathy.   Neurological: She is alert and oriented to person, place, and time.   Skin: Skin is warm and dry.   Psychiatric: She has a normal mood and affect. Her behavior is normal. Judgment and thought content normal.          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     Posterior cul de sac biopsy-metastatic adenocarcinoma.     Omentectomy- metastatic adenocarcinoma     LND-negative     Residual right IP ligament, Anterior cul de sac biopsy, right pelvic side will biopsy, left pelvic side wall, right gutter and left gutter-->benign    04/06/2017:   CT [name]- mild small bowel obstruction with transition point in the left  pelvis adjacent to the anterior abdominal wall. Low density in the common femoral veins bilaterally thought likely to  represent flow artificat, may consider follow up with lomer extremity Doppler to ensure there is no underlying deep venous thrombosis.    01/29/2018:   CTAP:  Interval development of necrotic retroperitoneal lymphadenopathy with largest node measuring 4.9  4.2 x 4.8 cm. Origin is indeterminate.  Options would include CT directed biopsy as well as PET/CT.    02/18/2018:   CA 125:  197.8    02/26/2018:   CT guided Left retroperitoneal node biopsy- focally involved by non-small cell malignancy- additional stains are diagnostic of non-small cell carcionoma and supportive of involve84ment by the patients reported previous known ovarian malignancy.    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  No other focal areas of FD avid malignancy are found. Specifically, no lymph node activity is found outside of the retroperitoneum and there is no evidence for FDG avid pulmonary, skeletal or hepatic parenchymal metastasis.    Cancer Staging:  Cancer Staging  Malignant neoplasm of ovary (CMS Dx)  Staging form: Ovary, Fallopian Tube, And Primary Peritoneal Carcinoma, AJCC 8th Edition  - Clinical: FIGO Stage IIIC - Signed by Mikle Bosworth, MD on 04/01/2018      Disease Status: Responding (10/06/2018  1:58 PM)               Assessment & Plan  My impression is Ms. 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 She completed 6 cycles of chemotherapy with Carboplatin and Paclitaxel-weekly (C6D1 was 08/27/18). CA 125 was 8.7 at C6. Plan for Rucaparib therapy.     She  underwent CT CAP on 09/22/2018 revealed ned in chest and interval decrease in size of a left periaortic necrotic lymph node from 1.8 to 1.6 cm. 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       We previously reviewed her scans which show at least a partial response- the previously noted aortic node has decreased in size from 1.8 cm to 1.2 cm.   PET on 10/15/18 shows reassuring intraabdominal findings with resolution of the previous hypermetabolic para-aortic lymph nodes and hepatic dome lesions, but did note uptake within the thyroid. She underwent thyroid US on 10/20/18 with a 1.2 cm irregular hypoechoic nodule in the left thyroid lobe is suspicious. Recommend FNA for further evaluation. This was ordered but has not been performed yet.  After discussing the above she has elected to proceed with rucaparib 600 mg po BID (28 days cycles) until disease progression.    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     She stopped her drug 12/03/18 due to fatigue and nausea. The antiemetics did help- but she still felt a little queasy. So she stopped. Now she wants to consider restarting at a lower dose. She is about to leave for [place]- and doesn't want to restart until she gets down there.   __restart drug at 500 mg po BID once she gets down to [place]- she will need labs drawn q 2 weeks initially and to see an oncologist  there q 4 weeks. Rx resent to speciality pharmacy   __continue preventative side effect drugs as below:    For supportive meds, discussed recommendation for nausea control to include Zofran three times a day, 30 min before morning dose of PARP inhibitor, and 30 min before meals and Pepcid complete 2 x a day or prilosec. For insomnia, benadryl at night and see if it improves fatigue during the day.  Of note, she is not interested in  future if needed to try Ritalin.   __1/02/2018- labs good;  CA 125: 5.6   __need thyroid FNA- ordered (see below)    Thyroid Nodule:   Incidental finding on PET scan of hypermetabolic focus within left thyroid nodule. Ultrasound performed on 10/20/18 with 1.2 cm irregular hypoechoic nodule, appearing suspicious. Recommend FNA for further evaluation, which was ordered.   __FNA ordered  __referral to ENT made     Genetics:  previously discussed her negative genetic testing results.      Pain  Pain Score: Zero (12/10/2018 10:29 AM)    She denies presence of pain.  Pain will be reassessed at next visit.       I have spent 25 minutes face to face with this patient with greater than 50% of this time spent in counseling and coordination of care discussing the above    Medical Decision Making:  The following items were considered in medical decision making:  Review / order clinical lab tests  Review / order radiology tests  Review / order other diagnostic tests/interventions      Maggie Font, MD, [facility]  Assistant Professor, Obstetrics and Gynecology  Division of Gynecologic Oncology  [phone]

## 2018-12-10 NOTE — Unmapped (Signed)
MEDICATION ACCESS SERVICES    Kelsey Sullivan had previously been approved for a grant to help her with the co-pay cost of her Rubraca. The patient has used all of her funding at this time.    An application was submitted for [employer]. Below is the patient's approval information:    Member ID: [id number]  Group ID: [id number]  RxBin ID: [id number]  PCN: [id number]  Eligibility Start Date: 09/11/2018  Eligibility End Date: 12/10/2019  Assistance Amount: $3,400.00     Household size: 1  Patient Income: $28,000    The billing information has been attached to the patient's pharmacy account.    Kelsey Sullivan [name], MHA, CPhT  Pharmacy Microbiologist, Prior Authorization and Medication [employer]  [phone]

## 2018-12-10 NOTE — Unmapped (Signed)
Pt called about starting a lower dosage of rubraca.

## 2018-12-23 NOTE — Unmapped (Signed)
Called pt to coordinate delivery of Rubraca 300mg  (200mg  were shipped 1/7) to complete 500mg  dose. Per pt, she is in [place] and waiting until she follows up with physician. Requested a call if she needs any medication sent out.

## 2019-03-02 IMAGING — CT CT CHEST/ABDOMEN AND PELVIS WITH CONTRAST  ([facility])
1 of 4 series · 12 of 32 positions shown, 18 images · IV contrast (agent unspecified)
Comparison: Comparison was made to the prior exam(s) within the last 12 months 
dated  PET CT October 15, 2018, CT abdomen pelvis September 22, 2018

CT CHEST/ABDOMEN AND PELVIS WITH CONTRAST  ([facility]), 03/02/2019 [DATE]: 
(Films were taken at [facility] on 03/02/2019 and interpreted at 
[facility] on 03/02/2019. 
CLINICAL INDICATION:  Enlarged lymph nodes, ovarian cancer, bilateral 
oophorectomy, omentectomy December 2012 
A search for DICOM formatted images was conducted for prior CT imaging studies 
completed at a non-affiliated media free facility.

[Series 2: cap w · axial · 0.98mm/px · z∈[-621,-90]mm · 12 of 211 slices shown, 18 images]
[im 17/211  soft-tissue]
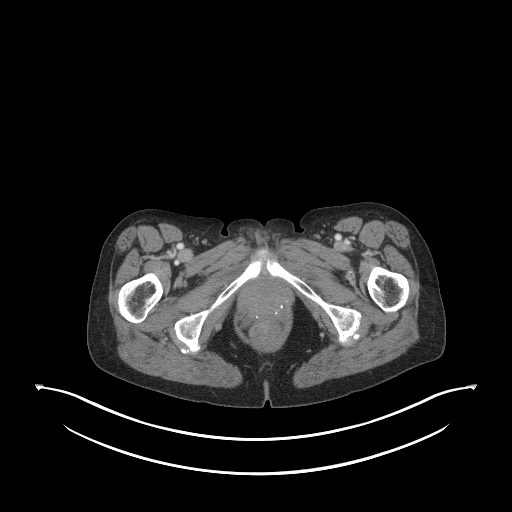
[im 17/211  bone]
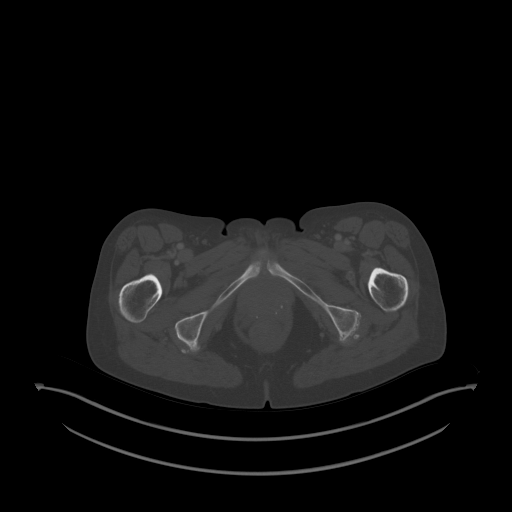
[im 33/211  soft-tissue]
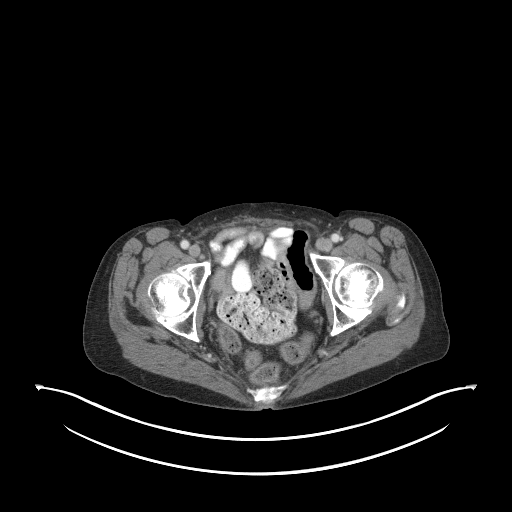
[im 49/211  soft-tissue]
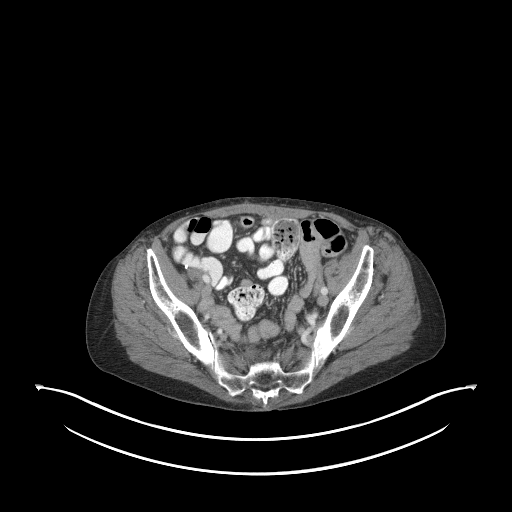
[im 65/211  soft-tissue]
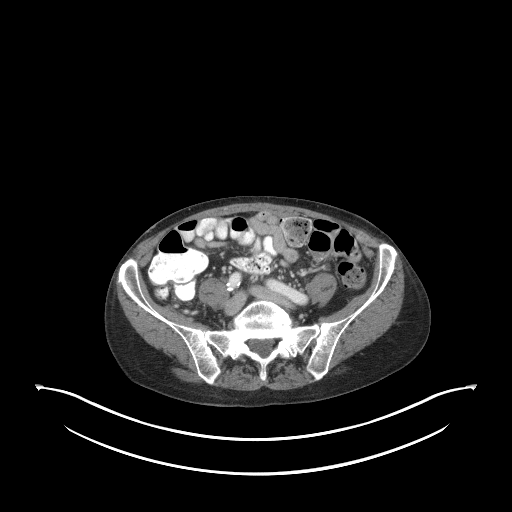
[im 81/211  soft-tissue]
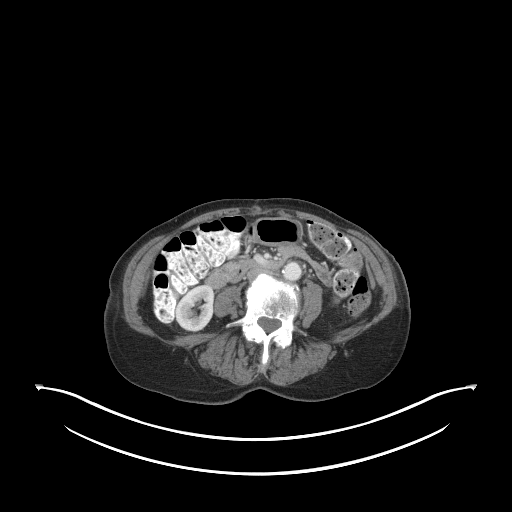
[im 97/211  soft-tissue]
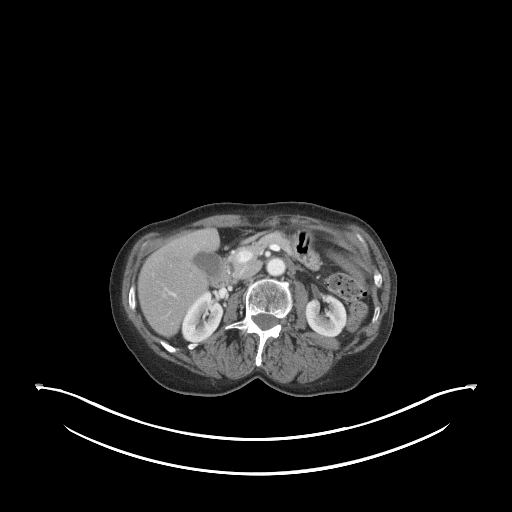
[im 114/211  soft-tissue]
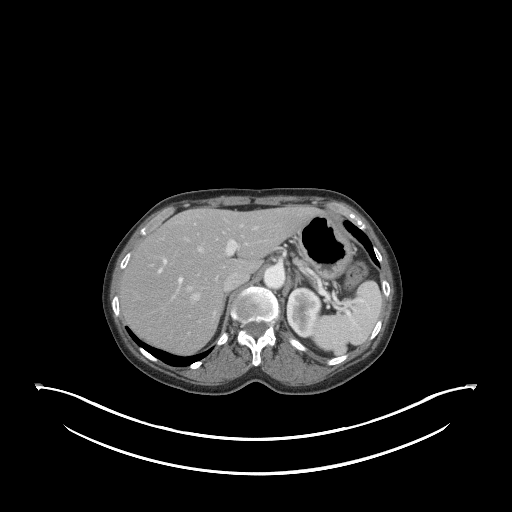
[im 130/211  soft-tissue]
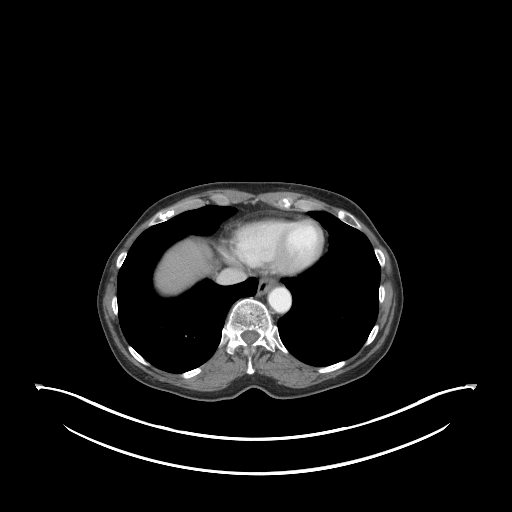
[im 146/211  soft-tissue]
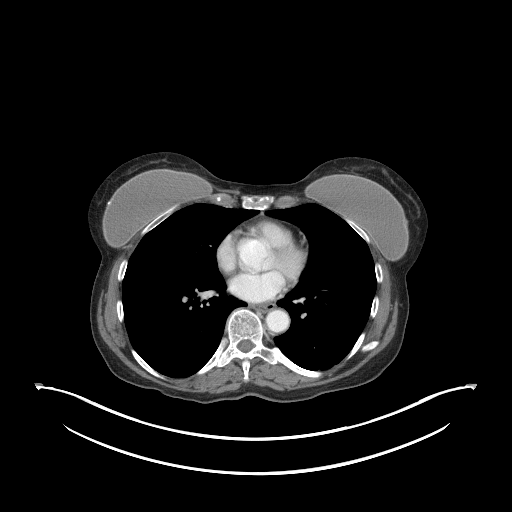
[im 146/211  lung]
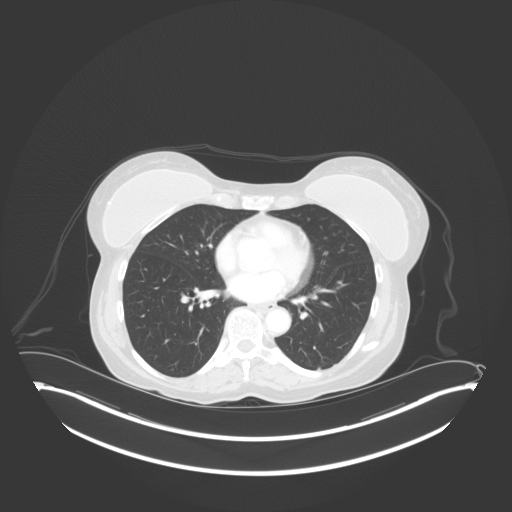
[im 146/211  bone]
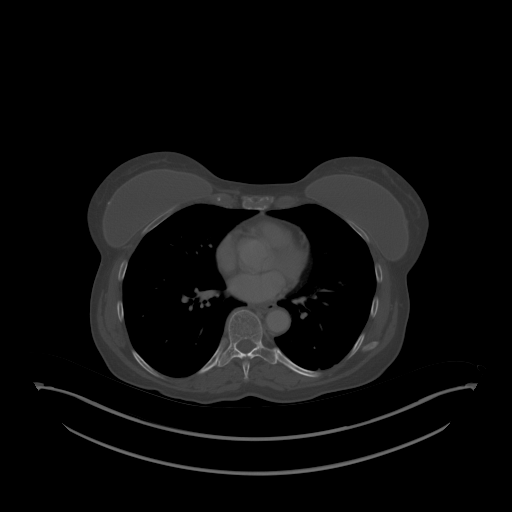
[im 162/211  soft-tissue]
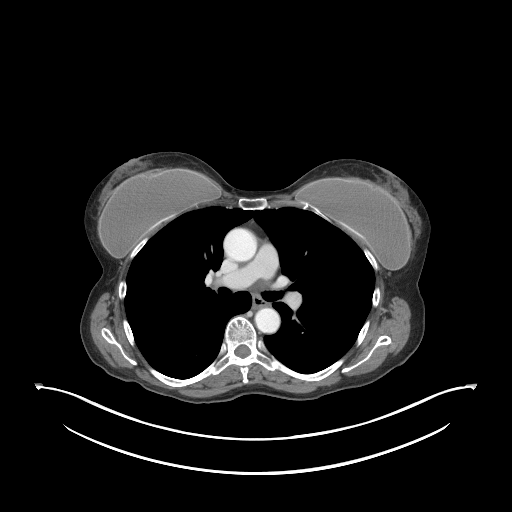
[im 162/211  lung]
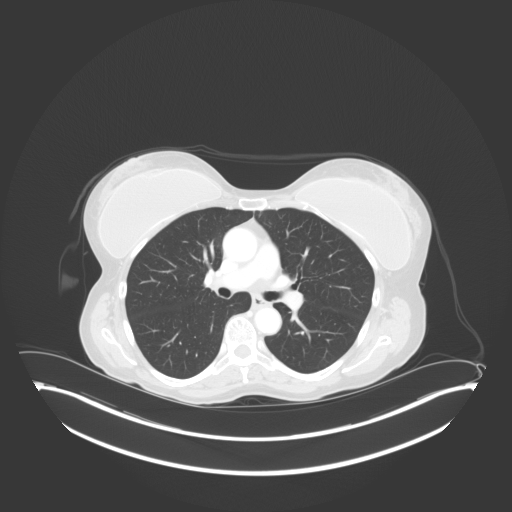
[im 178/211  soft-tissue]
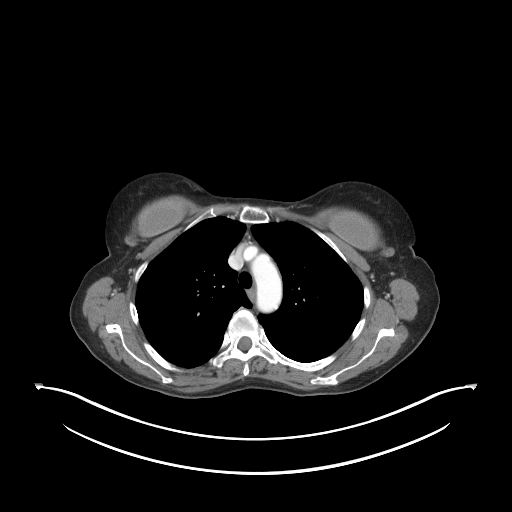
[im 178/211  lung]
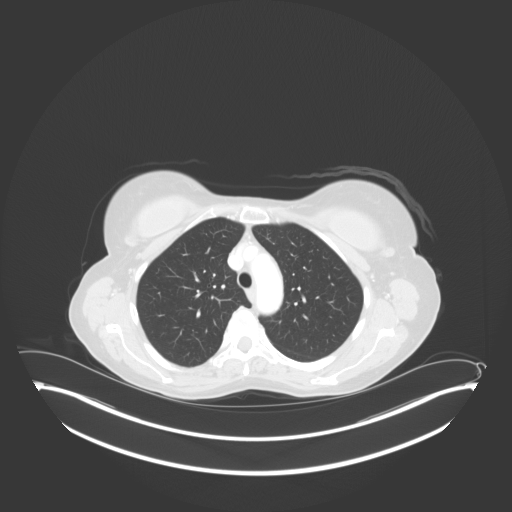
[im 194/211  soft-tissue]
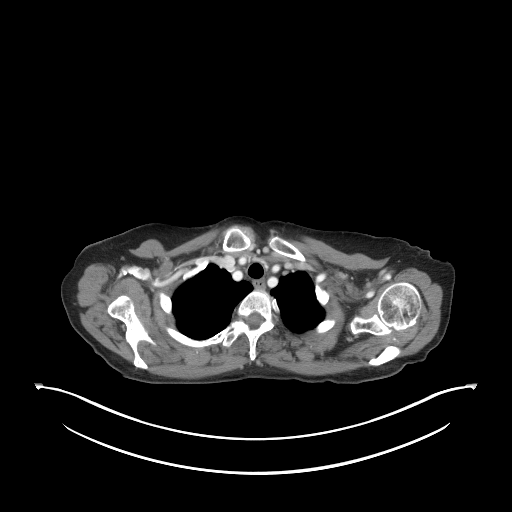
[im 194/211  lung]
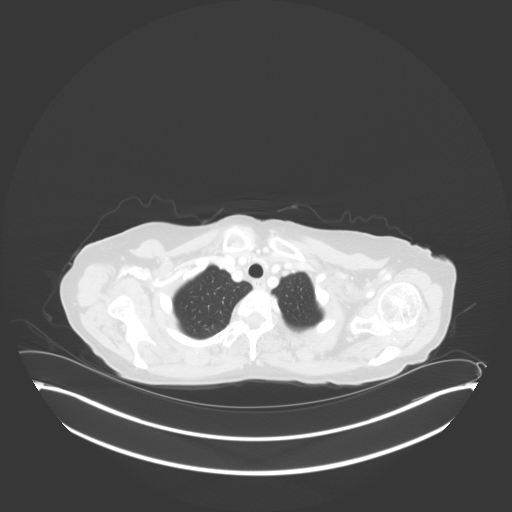

[12 of 32 positions shown; findings below may reference images not displayed]

FINDINGS: There is no mediastinal, supraclavicular or axillary adenopathy. 
Thyroid lobes are not enlarged. There is a partly calcified right thyroid 
nodule, and hypodense 4 mm thyroid nodule. There are bilateral subglandular 
breast implants. There are small emphysematous blebs in the left lower lobe. 
There is bronchial wall thickening without bronchiectasis. There is a 2 mm 
calcified right lower lobe,. There is no noncalcified pulmonary nodule. The 
heart is not enlarged. There is no pleural or pericardial effusion. 
The liver, spleen, adrenal glands, pancreas and gallbladder are unremarkable. 
Kidneys excrete contrast symmetrically. No stone or hydronephrosis. There is 
mild to moderate aortic calcification. There is increased colonic stool content. 
Urinary bladder is nondistended. There is no retroperitoneal, intra-abdominal, 
pelvic or inguinal adenopathy. There are surgical clips along the right iliac 
artery and at the level of the right obturator and inguinal nodal chains. 
Surgical clips are also seen in the left inguinal region. There is no evidence 
for peritoneal surface neoplastic involvement. There are degenerative changes.
IMPRESSION: No evidence for recurrent or new neoplasm. 
Increased colonic stool could indicate constipation. 
Postoperative changes. 
RADIATION DOSE REDUCTION: All CT scans are performed using radiation dose 
reduction techniques, when applicable.  Technical factors are evaluated and 
adjusted to ensure appropriate moderation of exposure.  Automated dose 
management technology is applied to adjust the radiation doses to minimize 
exposure while achieving diagnostic quality images.

## 2019-05-03 NOTE — Unmapped (Signed)
Addended by: Marlowe Sax on: 05/03/2019 09:28 AM     Modules accepted: Orders

## 2019-05-03 NOTE — Unmapped (Signed)
Pt has been in [place] since January wants to make an appointment at end of June with Dr [name] but wants to know if she needs to have her blood work done before or after the appoinment. Please advise

## 2019-06-02 ENCOUNTER — Ambulatory Visit: Payer: Medicare (Managed Care)

## 2019-06-13 ENCOUNTER — Other Ambulatory Visit: Admit: 2019-06-13 | Payer: Medicare (Managed Care)

## 2019-06-13 DIAGNOSIS — C569 Malignant neoplasm of unspecified ovary: Secondary | ICD-10-CM

## 2019-06-13 LAB — COMPREHENSIVE METABOLIC PANEL
ALT: 14 U/L (ref 7–52)
AST: 25 U/L (ref 13–39)
Albumin: 4.8 g/dL (ref 3.5–5.7)
Alkaline Phosphatase: 59 U/L (ref 36–125)
Anion Gap: 10 mmol/L (ref 3–16)
BUN: 12 mg/dL (ref 7–25)
CO2: 29 mmol/L (ref 21–33)
Calcium: 9.8 mg/dL (ref 8.6–10.3)
Chloride: 97 mmol/L — ABNORMAL LOW (ref 98–110)
Creatinine: 0.68 mg/dL (ref 0.60–1.30)
Glucose: 103 mg/dL — ABNORMAL HIGH (ref 70–100)
Osmolality, Calculated: 282 mosm/kg (ref 278–305)
Potassium: 4.4 mmol/L (ref 3.5–5.3)
Sodium: 136 mmol/L (ref 133–146)
Total Bilirubin: 0.7 mg/dL (ref 0.0–1.5)
Total Protein: 7 g/dL (ref 6.4–8.9)
eGFR AA CKD-EPI: >90 See note.
eGFR NONAA CKD-EPI: 84 See note.

## 2019-06-13 LAB — CBC
Hematocrit: 34.5 % (ref 35.0–45.0)
Hemoglobin: 12.1 g/dL (ref 11.7–15.5)
MCH: 36.5 pg (ref 27.0–33.0)
MCHC: 35.2 g/dL (ref 32.0–36.0)
MCV: 103.8 fL (ref 80.0–100.0)
MPV: 7.5 fL (ref 7.5–11.5)
Platelets: 190 10*3/uL (ref 140–400)
RBC: 3.33 10*6/uL (ref 3.80–5.10)
RDW: 12.4 % (ref 11.0–15.0)
WBC: 4.7 10*3/uL (ref 3.8–10.8)

## 2019-06-13 LAB — DIFFERENTIAL
Basophils Absolute: 28 /uL (ref 0–200)
Basophils Relative: 0.6 % (ref 0.0–1.0)
Eosinophils Absolute: 42 /uL (ref 15–500)
Eosinophils Relative: 0.9 % (ref 0.0–8.0)
Lymphocytes Absolute: 1227 /uL (ref 850–3900)
Lymphocytes Relative: 26.1 % (ref 15.0–45.0)
Monocytes Absolute: 367 /uL (ref 200–950)
Monocytes Relative: 7.8 % (ref 0.0–12.0)
Neutrophils Absolute: 3036 /uL (ref 1500–7800)
Neutrophils Relative: 64.6 % (ref 40.0–80.0)
nRBC: 0 /100{WBCs} (ref 0–0)

## 2019-06-13 LAB — CA 125: CA 125: 5 U/mL (ref 5.5–35.0)

## 2019-06-16 ENCOUNTER — Ambulatory Visit: Admit: 2019-06-16 | Payer: Medicare (Managed Care)

## 2019-06-16 ENCOUNTER — Other Ambulatory Visit: Admit: 2019-06-16 | Payer: Medicare (Managed Care)

## 2019-06-16 DIAGNOSIS — C569 Malignant neoplasm of unspecified ovary: Secondary | ICD-10-CM

## 2019-06-16 NOTE — Unmapped (Signed)
History of Present Illness  Kelsey Sullivan is a 78 y.o. female with   No chief complaint on file.    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 She declined adjuvant therapy. In 01/2018, she experienced mid abdominal pain, and seen by PCP and ED, with CT of abdomen and pelvis showing interval development of necrotic retroperitoneal lymphadenopathy with largest node measuring 4.9  4.2 x 4.8 cm. Her CA 125 on 02/18/18 was 197.8.  She underwent a ct-guided nodal biopsy on 02/26/18, with pathology confirming recurrent disease.  She completed 6 cycles of chemotherapy with Carboplatin and Paclitaxel-weekly (C6D1 was 08/27/18). CA 125 was 8.7 at C6.     She underwent CT CAP on 09/22/2018 revealed ned in chest and interval decrease in size of a left periaortic necrotic lymph node from 1.8 to 1.6 cm. 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 PET on 10/15/18 shows reassuring intraabdominal findings with resolution of the previous hypermetabolic para-aortic lymph nodes and hepatic dome lesions, but did note uptake within the thyroid. She underwent thyroid US on 10/20/18 with a 1.2 cm irregular hypoechoic nodule in the left thyroid lobe is suspicious. Recommended FNA for further evaluation, but she did not get this done.    She initiated maintenance rubraca 11/2018 but ultimately elected to stop in 02/2019 due to side effects.     03/02/2019:   CTAP: ([facility]- [place]): NED. Partially calcified right thyroid nodule  and hypodense 4 mm thyroid nodule    06/16/2019: Pt reports stopping Rubraca back in3/2020 while down in [place] 2/2 to decreased QOL.       Kelsey Sullivan reports she stopped her drug due decreased QOL and fatigue back in 02/2019 in [place]. Reports fatigue still present but better now that off of Rubraca   She denies any bloating or fullness  She reports normal bowel/bladder habits.   She denies any GU concerns. Denies any dysuria, frequency, hematuria, flank pain or fevers.  She denies any vaginal bleeding or discharge.   She has mild neuropathy.   She reports no leg swelling.   Her appetite is good, nausea is less off meds.  No vomiting.      Patient Active Problem List    Diagnosis   ??? Small bowel obstruction (CMS Dx)   ??? H/O small bowel obstruction   ??? 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 Pt declined adjuvant therapy      R ovary and fallopain tube-  Serous adenocarcinoma, high grade, arising from the fallopian tube, infarcted consistent with torsion.  Serous carcionma in situ.       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     Posterior cul de sac biopsy-metastatic adenocarcinoma.     Omentectomy- metastatic adenocarcinoma     LND-negative     Residual right IP ligament, Anterior cul de sac biopsy, right pelvic side will biopsy, left pelvic side wall, right gutter and left gutter-->benign    04/06/2017:   CT [name]- mild small bowel obstruction with transition point in the left pelvis adjacent to the anterior abdominal wall. Low density in the common femoral veins bilaterally thought likely to represent flow  artificat, may consider follow up with lomer extremity Doppler to ensure there is no underlying deep venous thrombosis.    01/29/2018:   CTAP:  Interval development of necrotic retroperitoneal lymphadenopathy with largest node measuring 4.9  4.2 x 4.8 cm. Origin is indeterminate.  Options would include CT directed biopsy as well as PET/CT.    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  No other focal areas of FD avid malignancy are found. Specifically, no lymph node activity is found outside of the retroperitoneum and there is no evidence for FDG avid pulmonary, skeletal or hepatic parenchymal metastasis.    02/2018:   Patient saw Dr. Ishmael Holter who recommended Carbo/Taxol with an AUC of 6.  Dr. Maren Reamer office submitted foundation one testing. Patient would like Genetic Testing    04/12/2018:   IR port placed    04/16/2018:   C1 weekly taxol 80 mg/m2 D1,8,15, Carboplatin AUC 6 D1 q 21 days. PCEs on thurs, txt on fridays. EXAMS ON EVEN CYCLES     CA 125: 523.4     Genetic testing: Neg for CS mutation, no VUS, lifetime remaining breast cancer risk -3.4%    05/07/2018:   C2 weekly taxol 80 mg/m2 D1,8,15, Carboplatin AUC 6 D1 q 21 days. PCEs on thurs, txt on fridays. EXAMS ON EVEN CYCLES     CA 125: 162.2    05/20/2018:   Early PCE due to scheduling, due 6/21    05/28/2018  Delay C3 due to low ANC     CA 125: 25    06/04/2018  C3 weekly taxol 80 mg/m2 D1,8,15, Carboplatin AUC 6 D1 q 21 days. EXAMS ON EVEN CYCLES.      CA 125    06/25/2018:   HOLD chemo due to low anc (1047)- defer to next week, and  change to q 28 days cycle to build in an off week to allow for counts to recover     CA 125: 10.6    07/02/2018:   C4 weekly taxol 80 mg/m2 D1,8,15, (missed day 5 due to admission) Carboplatin AUC 6 D1 q 28 days. ANC still lowish 1480s- neulasta Day 16      EXAMS ON EVEN CYCLES.  Review her genetics testing results      CA 125 7.2    07/07/2018:   [facility] admission: SBO, neutropenic fever     CTAP:Small bowel obstruction with transition point in the anterior  mid abdomen near the midline. Small amount of free fluid in the abdomen, which is nonspecific. 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     Peritoneum: Small amount of free fluid in the abdomen. A para-aortic density with central low attenuation measures 2.5 x 2 cm and is significantly decreased in size from prior.    07/16/2018:   CA 125: 28.5    07/30/2018:  C5 weekly taxol 80 mg/m2 D1,8,15, Carboplatin AUC 6 D1 q 28 days. Neulasta Day 16      EXAMS ON EVEN CYCLES.      CA 125: 11.4    08/27/2018:   C6 weekly taxol 80 mg/m2 D1,8,15, Carboplatin AUC 6 D1 q 28 days. Neulasta Day 16. Order Ct CAP. RTC 10/11 (3 weeks)     EXAMS ON EVEN CYCLES. Discussed option of maintenance parp I- pt not decided (doesn't want to feel tired anymore)     CA 125: 8.7    09/20/2018:   Port removed: While the skin surface appeared inflamed, there was no evidence of infection in the port reservoir.    09/22/2018:   CT Chest: No evidence of thoracic metastatic disease.     Suspected small seroma in the base of the right neck. ??Necrotic metastatic lesion could also have this appearance but considered less likely.     CtAP: Interval decrease in size of a left periaortic necrotic lymph node. No evidence of new metastatic disease.     Lymphatics: Previously demonstrated left para-aortic lymph node with central necrosis currently measures 1.2 cm on series 5 image 45, previously measured 1.8 cm in the short  axis.    10/05/2018:   Reviewed scans- aortic node has decreased in size uncertain if active cancer or just scarring. Check PET (to investigate of node is hypermetabolic as well as assess for liver implant previously noted on pet. Offered maintenance with parp I- rubraca 600 mg BID. Pt interested-order placed thru [facility] pharmacy. RTC BC in a few weeks to start drug and review PET     10/15/2018:   PET: Resolution of the previous hypermetabolic para-aortic lymph nodes and hepatic dome lesions. Hypermetabolic focus within the left thyroid lobe.  Consider further evaluation with ultrasound, if clinically indicated.     NECK: There is a focus of uptake within the left thyroid lobe (axial image 62), with max SUV of 3.3    10/20/2018:   Thyroid US: 1.2 cm irregular hypoechoic nodule in the left thyroid lobe is suspicious. Recommend FNA for further evaluation.     1.0 cm nodule right thyroid lobe containing macrocalcification is indeterminate. Recommend follow-up thyroid ultrasound in one year to evaluate for stability.     Recommend= FNA for suspicious left thyroid nodule     10/21/2018:   Order thyroid FNA. Pt has not gotten her rubraca yet due to financial issues. RTC2 weeks for PCE#1 (11/12/18)     CA 125:   4.8    11/12/2018:   C1 rubraca 600 mg po BID. RTC 2 weeks for labs (CBC, CMP, 4 weeks for PCE #2). Pt plans to go to [place] and may leave before PCE and re-est care at [facility] (Dr. Gerilyn Pilgrim). Referral to ENT placed (pt cancelled appt for 11/22) and FNA was prev ordered for thyroid nodule- but has not been done.      CA 125: 5.5    11/18/2018:   Prob visit for symptoms from rubraca:For supportive meds, discussed  recommendation for nausea control to include Zofran three times a day, 30 min before morning dose of PARP inhibitor, and 30 min before meals and Pepcid complete 2 x a day or prilosec. For insomnia, benadryl at night and see if it improves fatigue during the day.  Of note, she is not interested in future if needed  to try Ritalin. Pt to call in 1 week to review symptoms    11/26/18:   Pt calls to report she is doing better on above meds. Only complaint is sleeping too much. Advised she can cut the benadryl dose in half.     12/03/2018:   Pt self d/c'd rubraca due to side effects (decreased energy mainly, some nausea)    12/10/2018:   Pt desires to start again on lower dose- rx to speciality pharmacy for rubraca 500 mg po BID. Pt headed to [facility]- explained she needs to have labs checked q 2 weeks initially and to be followed by oncologist     06/13/2019:   CA 125: 5    06/16/2019:   C7 rubraca 600 mg po BID.    Disposition: chemo, imaging after 3 cucles, CA 125, possible parp maintenance after, genetic testing neg, [facility] one testing results from [facility] in [place]- this was not performed   Current disease status: Neg for CS mutation, no VUS, lifetime remaining breast cancer risk -3.4%  Genetics: negative for mutations, breast cancer lifetime risk 3.4%  Survivorship plan: pending completion of treatment              Past Medical History  She  has a past medical history of Bowel obstruction (CMS Dx) (2018) and Ovarian cancer (CMS Dx) (2014).  Past Medical History:   Diagnosis Date   ??? Bowel obstruction (CMS Dx) 2018   ??? Ovarian cancer (CMS Dx) 2014       Past Surgical History  Past Surgical History:   Procedure Laterality Date   ??? ABDOMINOPLASTY  1999   ??? BLEPHAROPLASTY  2000   ??? DILATION AND CURETTAGE OF UTERUS  1979   ??? ELBOW SURGERY  1990   ??? EYE SURGERY  1994   ??? KNEE ARTHROPLASTY Right    ??? ROTATOR CUFF REPAIR     ??? TRANSUMBILICAL AUGMENTATION MAMMAPLASTY     ??? VAGINAL HYSTERECTOMY  1979       Family History  No family history on file.    Family cancer history for ovarian, uterine and colon cancer is negative other than above.       Social History  Social History     Socioeconomic History   ??? Marital status: Divorced     Spouse name: Not on file   ??? Number of children: Not on file   ??? Years of education: Not on file   ??? Highest  education level: Not on file   Occupational History   ??? Not on file   Social Needs   ??? Financial resource strain: Not on file   ??? Food insecurity     Worry: Not on file     Inability: Not on file   ??? Transportation needs     Medical: Not on file     Non-medical: Not on file   Tobacco Use   ??? Smoking status: Former Smoker     Packs/day: 0.00   ??? Smokeless tobacco: Never Used   Substance and Sexual Activity   ??? Alcohol use: Yes   ??? Drug use: No   ??? Sexual  activity: Not on file   Lifestyle   ??? Physical activity     Days per week: Not on file     Minutes per session: Not on file   ??? Stress: Not on file   Relationships   ??? Social Wellsite geologist on phone: Not on file     Gets together: Not on file     Attends religious service: Not on file     Active member of club or organization: Not on file     Attends meetings of clubs or organizations: Not on file     Relationship status: Not on file   ??? Intimate partner violence     Fear of current or ex partner: Not on file     Emotionally abused: Not on file     Physically abused: Not on file     Forced sexual activity: Not on file   Other Topics Concern   ??? Caffeine Use No   ??? Occupational Exposure No   ??? Exercise Yes   ??? Seat Belt Yes   Social History Narrative    Mammogram-6 years ago and it was normal. Pt stated she isn't getting them anymore.      Colonoscopy-10 years ago        Pt reports she can lie flat in bed without SOB.    Pt states she can walk a flight of stairs and city block without chest pain and SOB>         Past OB/GYN History  G3P2  She reports menarche at age 59 and menopause at age 46-surgical.  She denies a history of STIs.  She denies a history of abnormal cervical cytology and reports her last cytologic examination she is unsure of. She has taken HRT.     Health maintenance:  Mammogram: Date 6 years ago Results normal  Colonoscopy: Date 10 years ago Results normal    Allergies  She is allergic to bactrim [sulfamethoxazole-trimethoprim]; nsaids  (non-steroidal anti-inflammatory drug); celecoxib; and metoclopramide.    BMI  There is no height or weight on file to calculate BMI.                   Histories  She has a past medical history of Bowel obstruction (CMS Dx) (2018) and Ovarian cancer (CMS Dx) (2014).    She has a past surgical history that includes Dilation and curettage of uterus (1979); Vaginal hysterectomy (1979); Elbow surgery (1990); Eye surgery (1994); Abdominoplasty (1999); Blepharoplasty (2000); Transumbilical augmentation mammaplasty; Rotator cuff repair; and Knee Arthroplasty (Right).    Her family history is not on file.    She reports that she has quit smoking. She smoked 0.00 packs per day. She has never used smokeless tobacco. She reports current alcohol use. She reports that she does not use drugs.    Allergies  Bactrim [sulfamethoxazole-trimethoprim]; Nsaids (non-steroidal anti-inflammatory drug); Celecoxib; and Metoclopramide    Medications  Outpatient Encounter Medications as of 06/16/2019   Medication Sig Dispense Refill   ??? ascorbic acid, vitamin C, (VITAMIN C) 1000 MG tablet Take 1,000 mg by mouth daily.     ??? chlorhexidine (PERIDEX) 0.12 % solution      ??? cholecalciferol, vitamin D3, (VITAMIN D3) 1000 units tablet Take 1 tablet (1,000 Units total) by mouth daily. 30 tablet 0   ??? digestive enzymes Cap Take by mouth.     ??? GLUTATHIONE-L ORAL Take by mouth daily.     ??? iodine-sodium iodide  2 % solution Apply topically if needed.     ??? Lactobac no.41/Bifidobact no.7 (PROBIOTIC-10 ORAL) Take 1 capsule by mouth daily. [employer] brand     ??? lidocaine-prilocaine (EMLA) cream Apply topically daily as needed. Apply pea-size amount to port site 30-60 min prior to accessing port 30 g 1   ??? omeprazole (PRILOSEC) 20 MG capsule Take 20 mg by mouth daily.            ??? ondansetron (ZOFRAN) 4 MG tablet Take 1 tablet (4 mg total) by mouth every 8 hours as needed for Nausea. 20 tablet 2   ??? proCHLORPERazine (COMPAZINE) 10 MG tablet Take 1 tablet  (10 mg total) by mouth every 6 hours as needed (For nausea and vomiting). 30 tablet 3   ??? rucaparib (RUBRACA) 200 mg Tab Take 1 tablet (200 mg) by mouth 2 times a day. (Take with 300 mg tablet for a total dose of 500mg ) 60 tablet 11   ??? rucaparib (RUBRACA) 300 mg Tab Take 300 mg by mouth 2 times a day. (Take with 200mg  tablet for a total dose of 500mg ) Indications: malignant neoplasm of the ovary 60 tablet 11   ??? UNABLE TO FIND multimineral     ??? vit b complex w-b 12 (B COMPLEX-VITAMIN B12) tablet Take 1 tablet by mouth daily.     ??? vitamin E 100 UNIT capsule Take 100 Units by mouth daily.       No facility-administered encounter medications on file as of 06/16/2019.         Review of Systems   Constitutional: Positive for fatigue. Negative for activity change, appetite change, chills, fever, weight gain and weight loss.   HENT: Negative for congestion, mouth sores, rhinorrhea, sinus pressure, sore throat and trouble swallowing.    Eyes: Negative for photophobia, redness, itching and visual disturbance.   Respiratory: Negative for apnea, cough, chest tightness and shortness of breath.    Cardiovascular: Negative for chest pain, palpitations and leg swelling.   Gastrointestinal: Negative for abdominal distention, abdominal pain, anal bleeding, bloating, blood in stool, constipation, diarrhea, nausea and vomiting.   Genitourinary: Negative for decreased urine volume, difficulty urinating, dysuria, frequency, genital sores, hematuria, pelvic pain, urgency, vaginal bleeding, vaginal discharge and vaginal pain.   Musculoskeletal: Negative for arthralgias, back pain, joint swelling and neck pain.   Skin: Negative for color change and pallor.   Neurological: Negative for dizziness, seizures, weakness, numbness and headaches.   Hematological: Negative for adenopathy. Does not bruise/bleed easily.   Psychiatric/Behavioral: Negative for agitation, confusion, depression and sleep disturbance.       Vitals  Blood pressure 147/88,  pulse 64, temperature 97 ??F (36.1 ??C), temperature source Oral, resp. rate 18, height 5' 4 (1.626 m), weight 122 lb 6.4 oz (55.5 kg), SpO2 99 %.    Physical Exam   Vitals reviewed.  Constitutional: She is oriented to person, place, and time. She appears well-developed and well-nourished.   HENT:   Head: Normocephalic and atraumatic.   Eyes: Conjunctivae and EOM are normal.   Neck: Normal range of motion. Neck supple. No thyromegaly present.   Cardiovascular: Normal rate, regular rhythm and normal heart sounds.   Pulmonary/Chest: Effort normal and breath sounds normal. No respiratory distress. She has no wheezes. She has no rales.   Prior port site well healed   Abdominal: Soft. Bowel sounds are normal. She exhibits no distension and no mass. There is no abdominal tenderness. There is no rebound and no guarding.  def   Genitourinary:    Genitourinary Comments: Deferred- pt declined exam 06/16/19       Musculoskeletal: Normal range of motion.         General: No edema.   Lymphadenopathy:     She has no cervical adenopathy.   Neurological: She is alert and oriented to person, place, and time.   Skin: Skin is warm and dry.   Psychiatric: She has a normal mood and affect. Her behavior is normal. Judgment and thought content normal.          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       L  ovary and fallopian tube- serous adenocarcinoma, high grade, 4 cm, favor metastasis.     Posterior cul de sac biopsy-metastatic adenocarcinoma.     Omentectomy- metastatic adenocarcinoma     LND-negative     Residual right IP ligament, Anterior cul de sac biopsy, right pelvic side will biopsy, left pelvic side wall, right gutter and left gutter-->benign    04/06/2017:   CT [name]- mild small bowel obstruction with transition point in the left pelvis adjacent to the anterior abdominal wall. Low density in the common femoral veins bilaterally thought likely to represent flow artificat, may consider follow up with lomer extremity Doppler to ensure there is no underlying deep venous thrombosis.    01/29/2018:   CTAP:  Interval development of necrotic retroperitoneal lymphadenopathy with largest node measuring 4.9  4.2 x 4.8 cm. Origin is indeterminate.  Options would include CT directed biopsy as well as PET/CT.    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  No other focal areas of FD avid malignancy are found. Specifically, no lymph node activity is found outside of the retroperitoneum and there is no evidence for FDG avid pulmonary, skeletal or hepatic parenchymal metastasis.    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 No retroperitoneal, intra-abdominal, pelvic or inguinal adenopathy, no evidence for  peritoneal surface neoplastic involvement. 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 She completed 6 cycles of chemotherapy with Carboplatin and Paclitaxel-weekly (C6D1 was 08/27/18). CA 125 was 8.7 at C6.     She underwent CT CAP on 09/22/2018 revealed ned in chest and interval decrease in size of a left periaortic necrotic lymph node from 1.8 to 1.6 cm. 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       We previously reviewed her scans which show at least a partial response- the previously noted aortic node has decreased in size from 1.8 cm to 1.2 cm.   PET on 10/15/18 shows reassuring intraabdominal findings with resolution of the previous hypermetabolic para-aortic lymph nodes and hepatic dome lesions, but did note uptake within the thyroid. She underwent thyroid US on 10/20/18 with a 1.2 cm irregular hypoechoic nodule in the left thyroid lobe is suspicious. Recommend FNA for further evaluation.     She initiated maintenance rubraca 11/2018 but ultimately elected to stop in 02/2019 due to side effects.   Patient had reestarted drug at 500 mg po BID once she gets down to [place]-. Was in [place] from 12/2018-05/2019. Reports her oncologist down in [place] had changed doses of Rubraca a few times, most recently to 300mg  po BID but decided to stop drug all together back in 02/2019 2/2 to decreased QOL. She  does not wish to restart drug at this time. Last CT scan 03/02/2019 in [place], no evidence of recurrent or new neoplasm. RTC in 3 mo with labs    __7/08/2019- labs good;  CA 125: 5.0  __need thyroid FNA- ordered (see below)    Thyroid Nodule:   Incidental finding on PET scan of hypermetabolic focus within left thyroid nodule. Ultrasound performed on 10/20/18 with 1.2 cm irregular hypoechoic nodule, appearing suspicious. Recommend FNA for further evaluation, which was ordered.   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 Would still recommend follow up with ENT  __FNA ordered  __referral to ENT made     Genetics:  previously discussed her negative genetic testing results.      Pain    Pain Score: Zero (06/16/2019 10:43 AM)    She denies presence of pain.  Pain will be reassessed at next visit.          Patient seen and discussed with Dr. [name], MD    Sheran Lawless, MD  OBGYN-[id number]    I saw and personally examined the patient today with my resident. I discussed the findings and therapeutic plan with the patient. I repeated, reviewed and agree with the history of present illness, past medical histories, family history, social history, medication list, and allergies as listed. The review of systems is as noted above. My physical exam confirms the findings listed above. Review of labs, pathology reports, radiograph reports, and medical records confirm the findings noted above. I agree with the assessment and plan as noted above. I have edited  the note where appropriate.     I have personally performed a face to face diagnostic evaluation on this patient, seen initially by the resident. I have personally developed the care plan as outlined in the Assessment and Plan.      Complexity: mod     I spent a total of 25 minutes face-to-face of which >50% was spent in counseling and/or coordination of care, documentation and counseling.with patient and/or family.      Topics discussed include rec ovarian   Cancer prognosis and treatment options and surveillance .      Maggie Font, MD  Gynecology Oncology  [phone]      Medical Decision Making:  The following items were considered in medical decision making:  Obtain records and history from outside facility/provider  Review / order clinical lab tests  Review / order radiology tests  Review / order other diagnostic tests/interventions  Reviewed outside records

## 2019-09-15 ENCOUNTER — Ambulatory Visit: Admit: 2019-09-15 | Payer: Medicare (Managed Care)

## 2019-09-15 DIAGNOSIS — C561 Malignant neoplasm of right ovary: Secondary | ICD-10-CM

## 2019-09-15 DIAGNOSIS — C569 Malignant neoplasm of unspecified ovary: Secondary | ICD-10-CM

## 2019-09-15 LAB — CBC
Hematocrit: 37.2 % (ref 35.0–45.0)
Hemoglobin: 13 g/dL (ref 11.7–15.5)
MCH: 35.4 pg — ABNORMAL HIGH (ref 27.0–33.0)
MCHC: 34.9 g/dL (ref 32.0–36.0)
MCV: 101.5 fL — ABNORMAL HIGH (ref 80.0–100.0)
MPV: 7.9 fL (ref 7.5–11.5)
Platelets: 191 10E3/uL (ref 140–400)
RBC: 3.66 10E6/uL — ABNORMAL LOW (ref 3.80–5.10)
RDW: 12.8 % (ref 11.0–15.0)
WBC: 4.2 10E3/uL (ref 3.8–10.8)

## 2019-09-15 LAB — COMPREHENSIVE METABOLIC PANEL
ALT: 15 U/L (ref 7–52)
AST: 27 U/L (ref 13–39)
Albumin: 4.6 g/dL (ref 3.5–5.7)
Alkaline Phosphatase: 73 U/L (ref 36–125)
Anion Gap: 9 mmol/L (ref 3–16)
BUN: 17 mg/dL (ref 7–25)
CO2: 29 mmol/L (ref 21–33)
Calcium: 9.8 mg/dL (ref 8.6–10.3)
Chloride: 97 mmol/L (ref 98–110)
Creatinine: 0.73 mg/dL (ref 0.60–1.30)
Glucose: 145 mg/dL (ref 70–100)
Osmolality, Calculated: 284 mOsm/kg (ref 278–305)
Potassium: 4.3 mmol/L (ref 3.5–5.3)
Sodium: 135 mmol/L (ref 133–146)
Total Bilirubin: 0.7 mg/dL (ref 0.0–1.5)
Total Protein: 7.4 g/dL (ref 6.4–8.9)
eGFR AA CKD-EPI: >90 See note.
eGFR NONAA CKD-EPI: 79 See note.

## 2019-09-15 LAB — DIFFERENTIAL
Basophils Absolute: 38 /uL (ref 0–200)
Basophils Relative: 0.9 % (ref 0.0–1.0)
Eosinophils Absolute: 59 /uL (ref 15–500)
Eosinophils Relative: 1.4 % (ref 0.0–8.0)
Lymphocytes Absolute: 1256 /uL (ref 850–3900)
Lymphocytes Relative: 29.9 % (ref 15.0–45.0)
Monocytes Absolute: 307 /uL (ref 200–950)
Monocytes Relative: 7.3 % (ref 0.0–12.0)
Neutrophils Absolute: 2541 /uL (ref 1500–7800)
Neutrophils Relative: 60.5 % (ref 40.0–80.0)
nRBC: 0 /100 WBC (ref 0–0)

## 2019-09-15 LAB — CA 125: CA 125: 6.1 U/mL (ref 5.5–35.0)

## 2019-09-15 NOTE — Unmapped (Signed)
History of Present Illness  DERIANA VANDERHOEF is a 78 y.o. female with   Chief Complaint   Patient presents with   ??? 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    Ms. 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 She declined adjuvant therapy. In 01/2018, she experienced mid abdominal pain, and seen by PCP and ED, with CT of abdomen and pelvis showing interval development of necrotic retroperitoneal lymphadenopathy with largest node measuring 4.9  4.2 x 4.8 cm. Her CA 125 on 02/18/18 was 197.8.  She underwent a ct-guided nodal biopsy on 02/26/18, with pathology confirming recurrent disease.  She completed 6 cycles of chemotherapy with Carboplatin and Paclitaxel-weekly (C6D1 was 08/27/18). CA 125 was 8.7 at C6.     She underwent CT CAP on 09/22/2018 revealed ned in chest and interval decrease in size of a left periaortic necrotic lymph node from 1.8 to 1.6 cm. 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 PET on 10/15/18 shows reassuring intraabdominal findings with resolution of the previous hypermetabolic para-aortic lymph nodes and hepatic dome lesions, but did note uptake within the thyroid. She underwent thyroid US on 10/20/18 with a 1.2 cm irregular hypoechoic nodule in the left thyroid lobe is suspicious. Recommended FNA for further evaluation, but she did not get this done.    She initiated maintenance rubraca 11/2018 but ultimately elected to stop in 02/2019 due to side effects.     03/02/2019:   CTAP: ([facility]- [place]): NED.  Partially calcified right thyroid nodule and hypodense 4 mm thyroid nodule    06/16/2019: Pt reports stopping Rubraca back in 02/2019 while down in [place] 2/2 to decreased QOL.     09/15/2019 Pt here for follow up, no complaints.       Yelina Sarratt Handley is doing well, reports being active with her dog. Has plans to return to [place] 09/25/2019.  She denies any bloating or fullness  She reports normal bowel/bladder habits.   She denies any GU concerns. Denies any dysuria, frequency, hematuria, flank pain or fevers.  She denies any vaginal bleeding or discharge.   She has mild neuropathy.   She reports no leg swelling.   Her appetite is good, nausea is less off meds.  No vomiting.      Patient Active Problem List    Diagnosis   ??? Small bowel obstruction (CMS Dx)   ??? H/O small bowel obstruction   ??? 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 Pt declined adjuvant therapy      R ovary and fallopain tube-  Serous adenocarcinoma,  high grade, arising from the fallopian tube, infarcted consistent with torsion. Serous carcionma in situ.       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     Posterior cul de sac biopsy-metastatic adenocarcinoma.     Omentectomy- metastatic adenocarcinoma     LND-negative     Residual right IP ligament, Anterior cul de sac biopsy, right pelvic side will biopsy, left pelvic side wall, right gutter and left gutter-->benign    04/06/2017:   CT [name]- mild small bowel obstruction with transition point in the left pelvis adjacent to the anterior abdominal wall. Low density in the common femoral veins  bilaterally thought likely to represent flow artificat, may consider follow up with lomer extremity Doppler to ensure there is no underlying deep venous thrombosis.    01/29/2018:   CTAP:  Interval development of necrotic retroperitoneal lymphadenopathy with largest node measuring 4.9  4.2 x 4.8 cm. Origin is indeterminate.  Options would include CT directed biopsy as well as PET/CT.    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  No other focal areas of FD avid malignancy are found. Specifically, no lymph node activity is found outside of the retroperitoneum and there is no evidence for FDG avid pulmonary, skeletal or hepatic parenchymal metastasis.    02/2018:   Patient saw Dr. Ishmael Holter who recommended Carbo/Taxol with an AUC of 6.  Dr. Maren Reamer office submitted foundation one testing. Patient would like Genetic Testing    04/12/2018:   IR port placed    04/16/2018:   C1 weekly taxol 80 mg/m2 D1,8,15, Carboplatin AUC 6 D1 q 21 days. PCEs on thurs, txt on fridays. EXAMS ON EVEN CYCLES     CA 125: 523.4     Genetic testing: Neg for CS mutation, no VUS, lifetime remaining breast cancer risk -3.4%    05/07/2018:   C2 weekly taxol 80 mg/m2 D1,8,15, Carboplatin AUC 6 D1 q 21 days. PCEs on thurs, txt on fridays. EXAMS ON EVEN CYCLES     CA 125: 162.2    05/20/2018:   Early PCE due to scheduling, due 6/21    05/28/2018  Delay C3 due to low ANC     CA 125: 25    06/04/2018  C3 weekly taxol 80 mg/m2 D1,8,15, Carboplatin AUC 6 D1 q 21 days. EXAMS ON EVEN CYCLES.      CA 125    06/25/2018:   HOLD chemo  due to low anc (1047)- defer to next week, and change to q 28 days cycle to build in an off week to allow for counts to recover     CA 125: 10.6    07/02/2018:   C4 weekly taxol 80 mg/m2 D1,8,15, (missed day 5 due to admission) Carboplatin AUC 6 D1 q 28 days. ANC still lowish 1480s- neulasta Day 16      EXAMS ON EVEN CYCLES.  Review her genetics testing results      CA 125 7.2    07/07/2018:   [facility] admission: SBO, neutropenic fever  CTAP:Small bowel obstruction with transition point in the anterior mid abdomen near the midline. Small amount of free fluid in the abdomen, which is nonspecific. 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     Peritoneum: Small amount of free fluid in the abdomen. A para-aortic density with central low attenuation measures 2.5 x 2 cm and is significantly decreased in size from prior.    07/16/2018:   CA 125: 28.5    07/30/2018:  C5 weekly taxol 80 mg/m2 D1,8,15, Carboplatin AUC 6 D1 q 28 days. Neulasta Day 16      EXAMS ON EVEN CYCLES.      CA 125: 11.4    08/27/2018:   C6 weekly taxol 80 mg/m2 D1,8,15, Carboplatin AUC 6 D1 q 28 days. Neulasta Day 16. Order Ct CAP. RTC 10/11 (3 weeks)     EXAMS ON EVEN CYCLES. Discussed option of maintenance parp I- pt not decided (doesn't want to feel tired anymore)     CA 125: 8.7    09/20/2018:   Port removed: While the skin surface appeared inflamed, there was no evidence of infection in the port reservoir.    09/22/2018:   CT Chest: No evidence of thoracic metastatic disease.     Suspected small seroma in the base of the right neck. ??Necrotic metastatic lesion could also have this appearance but considered less likely.     CtAP: Interval decrease in size of a left periaortic necrotic lymph node. No evidence of new metastatic disease.     Lymphatics: Previously demonstrated left para-aortic lymph node with central necrosis currently measures 1.2 cm on series 5 image 45, previously  measured 1.8 cm in the short axis.    10/05/2018:   Reviewed scans- aortic node has decreased in size uncertain if active cancer or just scarring. Check PET (to investigate of node is hypermetabolic as well as assess for liver implant previously noted on pet. Offered maintenance with parp I- rubraca 600 mg BID. Pt interested-order placed thru spec pharmacy. RTC BC in a few weeks to start drug and review PET     10/15/2018:   PET: Resolution of the previous hypermetabolic para-aortic lymph nodes and hepatic dome lesions. Hypermetabolic focus within the left thyroid lobe.  Consider further evaluation with ultrasound, if clinically indicated.     NECK: There is a focus of uptake within the left thyroid lobe (axial image 62), with max SUV of 3.3    10/20/2018:   Thyroid US: 1.2 cm irregular hypoechoic nodule in the left thyroid lobe is suspicious. Recommend FNA for further evaluation.     1.0 cm nodule right thyroid lobe containing macrocalcification is indeterminate. Recommend follow-up thyroid ultrasound in one year to evaluate for stability.     Recommend= FNA for suspicious left thyroid nodule     10/21/2018:   Order thyroid FNA. Pt has not gotten her rubraca yet due to financial issues. RTC2 weeks for PCE#1 (11/12/18)     CA 125:   4.8    11/12/2018:   C1 rubraca 600 mg po BID. RTC 2 weeks for labs (CBC, CMP, 4 weeks for PCE #2). Pt plans to go to [place] and may leave before PCE and re-est care at [facility] (Dr. Gerilyn Pilgrim). Referral to ENT placed (pt cancelled appt for 11/22) and FNA was prev ordered for thyroid nodule- but has not been done.      CA 125: 5.5    11/18/2018:  Prob visit for symptoms from rubraca:For supportive meds, discussed recommendation for nausea control to include Zofran three times a day, 30 min before morning dose of PARP inhibitor, and 30 min before meals and Pepcid complete 2 x a day or prilosec. For insomnia, benadryl at night and see if it improves fatigue during the day.  Of note, she is not  interested in future if needed to try Ritalin. Pt to call in 1 week to review symptoms    11/26/18:   Pt calls to report she is doing better on above meds. Only complaint is sleeping too much. Advised she can cut the benadryl dose in half.     12/03/2018:   Pt self d/c'd rubraca due to side effects (decreased energy mainly, some nausea)    12/10/2018:   Pt desires to start again on lower dose- rx to speciality pharmacy for rubraca 500 mg po BID. Pt headed to [facility]- explained she needs to have labs checked q 2 weeks initially and to be followed by oncologist (Dr. Bayard Males, [facility]) fax [phone]    02/2019:   Pt stops rubraca due to side effects, dose has been reduced per pt    03/02/2019:   CTAP: ([facility]- [place]): NED. Partially calcified right thyroid nodule and hypodense 4 mm thyroid nodule    06/13/2019:   CA 125: 5. RTC 3 mos for CA 125, exam. Pt to go back to [place] in 09/2019. Recommended follow up for thyroid nodule    09/15/2019:   CA 125: 6.1. Pt to go back to [place] in 09/2019. Recommended follow up for thyroid nodule. Sent letter to Dr. Gerilyn Pilgrim in [place] fax [phone]    Disposition: chemo, imaging after 3 cucles, CA 125, possible parp maintenance after, genetic testing neg, [facility] one testing results from [facility] in [place]- this was not performed   Current disease status: Neg for CS mutation, no VUS, lifetime remaining breast cancer risk -3.4%  Genetics: negative for mutations, breast cancer lifetime risk 3.4%  Survivorship plan: pending completion of treatment              Past Medical History  She  has a past medical history of Bowel obstruction (CMS Dx) (2018) and Ovarian cancer (CMS Dx) (2014).  Past Medical History:   Diagnosis Date   ??? Bowel obstruction (CMS Dx) 2018   ??? Ovarian cancer (CMS Dx) 2014       Past Surgical History  Past Surgical History:   Procedure Laterality Date   ??? ABDOMINOPLASTY  1999   ??? BLEPHAROPLASTY  2000   ??? DILATION AND CURETTAGE OF UTERUS  1979   ??? ELBOW SURGERY  1990    ??? EYE SURGERY  1994   ??? KNEE ARTHROPLASTY Right    ??? ROTATOR CUFF REPAIR     ??? TRANSUMBILICAL AUGMENTATION MAMMAPLASTY     ??? VAGINAL HYSTERECTOMY  1979       Family History  History reviewed. No pertinent family history.    Family cancer history for ovarian, uterine and colon cancer is negative other than above.       Social History  Social History     Socioeconomic History   ??? Marital status: Divorced     Spouse name: None   ??? Number of children: None   ??? Years of education: None   ??? Highest education level: None   Occupational History   ??? None   Social Needs   ??? Financial resource strain: None   ???  Food insecurity     Worry: None     Inability: None   ??? Transportation needs     Medical: None     Non-medical: None   Tobacco Use   ??? Smoking status: Former Smoker     Packs/day: 0.00   ??? Smokeless tobacco: Never Used   Substance and Sexual Activity   ??? Alcohol use: Yes   ??? Drug use: No   ??? Sexual activity: Not Currently   Lifestyle   ??? Physical activity     Days per week: None     Minutes per session: None   ??? Stress: None   Relationships   ??? Social Wellsite geologist on phone: None     Gets together: None     Attends religious service: None     Active member of club or organization: None     Attends meetings of clubs or organizations: None     Relationship status: None   ??? Intimate partner violence     Fear of current or ex partner: None     Emotionally abused: None     Physically abused: None     Forced sexual activity: None   Other Topics Concern   ??? Caffeine Use No   ??? Occupational Exposure No   ??? Exercise Yes   ??? Seat Belt Yes   Social History Narrative    Mammogram-6 years ago and it was normal. Pt stated she isn't getting them anymore.      Colonoscopy-10 years ago        Pt reports she can lie flat in bed without SOB.    Pt states she can walk a flight of stairs and city block without chest pain and SOB>         Past OB/GYN History  G3P2  She reports menarche at age 28 and menopause at age  58-surgical.  She denies a history of STIs.  She denies a history of abnormal cervical cytology and reports her last cytologic examination she is unsure of. She has taken HRT.     Health maintenance:  Mammogram: Date 6 years ago Results normal  Colonoscopy: Date 10 years ago Results normal    Allergies  She is allergic to bactrim [sulfamethoxazole-trimethoprim]; nsaids (non-steroidal anti-inflammatory drug); celecoxib; and metoclopramide.    BMI  Body mass index is 22.04 kg/m??.    Disease Status: No evidence of disease/remission (06/16/2019 12:42 PM)              Histories  She has a past medical history of Bowel obstruction (CMS Dx) (2018) and Ovarian cancer (CMS Dx) (2014).    She has a past surgical history that includes Dilation and curettage of uterus (1979); Vaginal hysterectomy (1979); Elbow surgery (1990); Eye surgery (1994); Abdominoplasty (1999); Blepharoplasty (2000); Transumbilical augmentation mammaplasty; Rotator cuff repair; and Knee Arthroplasty (Right).    Her family history is not on file.    She reports that she has quit smoking. She smoked 0.00 packs per day. She has never used smokeless tobacco. She reports current alcohol use. She reports that she does not use drugs.    Allergies  Bactrim [sulfamethoxazole-trimethoprim]; Nsaids (non-steroidal anti-inflammatory drug); Celecoxib; and Metoclopramide    Medications  Outpatient Encounter Medications as of 09/15/2019   Medication Sig Dispense Refill   ??? ascorbic acid, vitamin C, (VITAMIN C) 1000 MG tablet Take 1,000 mg by mouth daily.     ??? chlorhexidine (PERIDEX) 0.12 % solution      ???  cholecalciferol, vitamin D3, (VITAMIN D3) 1000 units tablet Take 1 tablet (1,000 Units total) by mouth daily. 30 tablet 0   ??? GLUTATHIONE-L ORAL Take by mouth daily.     ??? ondansetron (ZOFRAN) 4 MG tablet Take 1 tablet (4 mg total) by mouth every 8 hours as needed for Nausea. 20 tablet 2   ??? vit b complex w-b 12 (B COMPLEX-VITAMIN B12) tablet Take 1 tablet by mouth  daily.     ??? vitamin E 100 UNIT capsule Take 100 Units by mouth daily.     ??? digestive enzymes Cap Take by mouth.     ??? iodine-sodium iodide 2 % solution Apply topically if needed.     ??? Lactobac no.41/Bifidobact no.7 (PROBIOTIC-10 ORAL) Take 1 capsule by mouth daily. [employer] brand     ??? lidocaine-prilocaine (EMLA) cream Apply topically daily as needed. Apply pea-size amount to port site 30-60 min prior to accessing port 30 g 1   ??? omeprazole (PRILOSEC) 20 MG capsule Take 20 mg by mouth daily.            ??? proCHLORPERazine (COMPAZINE) 10 MG tablet Take 1 tablet (10 mg total) by mouth every 6 hours as needed (For nausea and vomiting). 30 tablet 3   ??? rucaparib (RUBRACA) 200 mg Tab Take 1 tablet (200 mg) by mouth 2 times a day. (Take with 300 mg tablet for a total dose of 500mg ) 60 tablet 11   ??? rucaparib (RUBRACA) 300 mg Tab Take 300 mg by mouth 2 times a day. (Take with 200mg  tablet for a total dose of 500mg ) Indications: malignant neoplasm of the ovary 60 tablet 11   ??? UNABLE TO FIND multimineral       No facility-administered encounter medications on file as of 09/15/2019.         Review of Systems   Constitutional: Positive for fatigue. Negative for activity change, appetite change, chills, fever, weight gain and weight loss.   HENT: Negative for congestion, mouth sores, rhinorrhea, sinus pressure, sore throat and trouble swallowing.    Eyes: Negative for photophobia, redness, itching and visual disturbance.   Respiratory: Negative for apnea, cough, chest tightness and shortness of breath.    Cardiovascular: Negative for chest pain, palpitations and leg swelling.   Gastrointestinal: Negative for abdominal distention, abdominal pain, anal bleeding, bloating, blood in stool, constipation, diarrhea, nausea and vomiting.   Genitourinary: Negative for decreased urine volume, difficulty urinating, dysuria, frequency, genital sores, hematuria, pelvic pain, urgency, vaginal bleeding, vaginal discharge and vaginal  pain.   Musculoskeletal: Negative for arthralgias, back pain, joint swelling and neck pain.   Skin: Negative for color change and pallor.   Neurological: Negative for dizziness, seizures, weakness, numbness and headaches.   Hematological: Negative for adenopathy. Does not bruise/bleed easily.   Psychiatric/Behavioral: Negative for agitation, confusion, depression and sleep disturbance.       Vitals  Blood pressure 142/82, pulse 68, temperature 98.5 ??F (36.9 ??C), temperature source Oral, height 5' 3 (1.6 m), weight 124 lb 6.4 oz (56.4 kg), SpO2 100 %.    Physical Exam   Vitals reviewed.  Constitutional: She is oriented to person, place, and time. She appears well-developed and well-nourished.   HENT:   Head: Normocephalic and atraumatic.   Eyes: Conjunctivae and EOM are normal.   Neck: Normal range of motion. Neck supple. No thyromegaly present.   Cardiovascular: Normal rate, regular rhythm and normal heart sounds.   Pulmonary/Chest: Effort normal and breath sounds normal. No  respiratory distress. She has no wheezes. She has no rales.   Prior port site well healed   Abdominal: Soft. Bowel sounds are normal. She exhibits no distension and no mass. There is no abdominal tenderness. There is no rebound and no guarding.   def   Genitourinary:    Genitourinary Comments: Normal external genitalia/vulva.  Normal urethral meatus, urethra, bladder, anus and perineum.  Normal vagina and vaginal cuff.  Uterus, cervix and adnexa surgically absent.  Normal rectum.  There is no nodularity in the posterior culdesac or rectovaginal septum.         Musculoskeletal: Normal range of motion.         General: No edema.   Lymphadenopathy:     She has no cervical adenopathy.   Neurological: She is alert and oriented to person, place, and time.   Skin: Skin is warm and dry.   Psychiatric: She has a normal mood and affect. Her behavior is normal. Judgment and thought content normal.          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     Posterior cul de sac biopsy-metastatic adenocarcinoma.     Omentectomy- metastatic adenocarcinoma     LND-negative     Residual right IP ligament, Anterior cul de sac biopsy, right pelvic side will biopsy, left pelvic side wall, right gutter and left gutter-->benign    04/06/2017:   CT [name]- mild small bowel obstruction with transition point in the left pelvis adjacent to the anterior abdominal wall. Low density in the common femoral veins bilaterally thought likely to represent flow artificat, may consider follow up with lomer extremity Doppler to ensure there is no underlying deep venous thrombosis.    01/29/2018:   CTAP:  Interval development of necrotic retroperitoneal lymphadenopathy with largest node measuring 4.9  4.2 x 4.8 cm. Origin is indeterminate.  Options would include CT directed biopsy as well as PET/CT.    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    03/04/2018:   PET/CT-  FDG avid retroperitoneal lymphadenopathy as described above and an FDG avid  lesion over the dome of the liver most likely a peritoneal implant rather than a liver metastasis.  No other focal areas of FD avid malignancy are found. Specifically, no lymph node activity is found outside of the retroperitoneum and there is no  evidence for FDG avid pulmonary, skeletal or hepatic parenchymal metastasis.    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 No retroperitoneal, intra-abdominal, pelvic or inguinal adenopathy, no evidence for peritoneal surface neoplastic involvement. No evidence for recurrent or new neoplasm    Cancer Staging:  Cancer Staging  Malignant neoplasm of ovary (CMS Dx)  Staging form: Ovary, Fallopian Tube, And Primary Peritoneal Carcinoma, AJCC 8th Edition  - Clinical: FIGO Stage IIIC - Signed by Mikle Bosworth, MD on 04/01/2018      Disease Status: No evidence of disease/remission (06/16/2019 12:42 PM)               Assessment & Plan  My impression is Ms. 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 She completed 6 cycles of chemotherapy with Carboplatin and Paclitaxel-weekly (C6D1 was 08/27/18). CA 125 was 8.7 at C6.     She underwent CT CAP on 09/22/2018 revealed ned in chest and interval decrease in size of a left periaortic necrotic lymph node from 1.8 to 1.6 cm. 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     PET on 10/15/18 shows reassuring intraabdominal findings with resolution of the previous hypermetabolic para-aortic lymph nodes and hepatic dome lesions, but did note uptake within the thyroid. She underwent  thyroid US on 10/20/18 with a 1.2 cm irregular hypoechoic nodule in the left thyroid lobe is suspicious. Recommend FNA for further evaluation.     She initiated maintenance rubraca 11/2018 but ultimately elected to stop in 02/2019 due to side effects.   Patient had reestarted drug at 500 mg po BID once she got down to [place]-. Was in [place] from 12/2018-05/2019. Reports her oncologist down in [place] had changed doses of Rubraca a few times, most  to 300mg  po BID but decided to stop drug all together back in 02/2019 2/2 to decreased QOL. She does not wish to restart drug at this time. Last CT scan 03/02/2019 in [place], no evidence of recurrent or new neoplasm. RTC in 3 mo with labs    __10/07/2019- labs good;  CA 125: 6.1, stable  __need thyroid FNA- ordered (see below)    Thyroid Nodule:   Incidental finding on PET scan of hypermetabolic focus within left thyroid nodule. Ultrasound performed on 10/20/18 with 1.2 cm irregular hypoechoic nodule, appearing suspicious. Recommend FNA for further evaluation, which was ordered. Pt has not followed up to date.  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 Would still recommend follow up with ENT  __FNA ordered  __referral to ENT made     Genetics:  previously discussed her negative genetic testing results.    Pain    Pain Score: Zero (09/15/2019 11:05 AM)    She denies presence of pain.  Pain will be reassessed at next visit.             Enos Fling MD PGY2  [facility]       I saw and personally examined the patient today with my resident. I discussed the findings and therapeutic plan with the patient. I repeated, reviewed and agree with the history  of present illness, past medical histories, family history, social history, medication list, and allergies as listed. The review of systems is as noted above. My physical exam confirms the findings listed above. Review of labs, pathology reports, radiograph reports, and medical records confirm the findings noted  above. I agree with the assessment and plan as noted above. I have edited the note where appropriate.     I have personally performed a face to face diagnostic evaluation on this patient, seen initially by the resident. I have personally developed the care plan as outlined in the Assessment and Plan.      Complexity: mod     I spent a total of 25 minutes face-to-face of which >50% was spent in counseling and/or coordination of care, documentation and counseling.with patient and/or family.      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      Maggie Font, MD  Gynecology Oncology  [phone]      Medical Decision Making:  The following items were considered in medical decision making:  Review / order clinical lab tests  Review / order radiology tests  Review / order other diagnostic tests/interventions

## 2019-09-15 NOTE — Telephone Encounter (Signed)
LVM notifying the patient that her lab today was normal, and to RTC with any questions/concerns.

## 2019-09-28 NOTE — Telephone Encounter (Signed)
Pt had onc history sent to provider in [facility] but they did not get lab history. She would like those sent to the provider in [facility].  She said she gave an RN the number and Dr name, but she does not remember them herself.

## 2019-09-28 NOTE — Telephone Encounter (Signed)
Labs faxed to [facility] oncologist per [facility].

## 2019-09-28 NOTE — Telephone Encounter (Signed)
LVM letting pt know we faxed requested labs to [facility] clinic

## 2020-05-31 ENCOUNTER — Ambulatory Visit: Admit: 2020-05-31 | Payer: Medicare (Managed Care)

## 2020-05-31 DIAGNOSIS — C569 Malignant neoplasm of unspecified ovary: Secondary | ICD-10-CM

## 2020-05-31 DIAGNOSIS — Z79899 Other long term (current) drug therapy: Secondary | ICD-10-CM

## 2020-05-31 LAB — DIFFERENTIAL
Basophils Absolute: 40 /uL (ref 0–200)
Basophils Relative: 0.7 % (ref 0.0–1.0)
Eosinophils Absolute: 51 /uL (ref 15–500)
Eosinophils Relative: 0.9 % (ref 0.0–8.0)
Lymphocytes Absolute: 1157 /uL (ref 850–3900)
Lymphocytes Relative: 20.3 % (ref 15.0–45.0)
Monocytes Absolute: 342 /uL (ref 200–950)
Monocytes Relative: 6 % (ref 0.0–12.0)
Neutrophils Absolute: 4110 /uL (ref 1500–7800)
Neutrophils Relative: 72.1 % (ref 40.0–80.0)
nRBC: 0 /100{WBCs} (ref 0–0)

## 2020-05-31 LAB — COMPREHENSIVE METABOLIC PANEL
ALT: 19 U/L (ref 7–52)
AST: 29 U/L (ref 13–39)
Albumin: 4.6 g/dL (ref 3.5–5.7)
Alkaline Phosphatase: 72 U/L (ref 36–125)
Anion Gap: 10 mmol/L (ref 3–16)
BUN: 18 mg/dL (ref 7–25)
CO2: 27 mmol/L (ref 21–33)
Calcium: 9.5 mg/dL (ref 8.6–10.3)
Chloride: 97 mmol/L — ABNORMAL LOW (ref 98–110)
Creatinine: 0.75 mg/dL (ref 0.60–1.30)
Glucose: 138 mg/dL — ABNORMAL HIGH (ref 70–100)
Osmolality, Calculated: 282 mosm/kg (ref 278–305)
Potassium: 4.3 mmol/L (ref 3.5–5.3)
Sodium: 134 mmol/L (ref 133–146)
Total Bilirubin: 0.5 mg/dL (ref 0.0–1.5)
Total Protein: 6.9 g/dL (ref 6.4–8.9)
eGFR AA CKD-EPI: 88 See note.
eGFR NONAA CKD-EPI: 76 See note.

## 2020-05-31 LAB — LIPID PANEL
Cholesterol, Total: 209 mg/dL (ref 0–200)
HDL: 90 mg/dL (ref 60–92)
LDL Cholesterol: 106 mg/dL
Triglycerides: 65 mg/dL (ref 10–149)

## 2020-05-31 LAB — CA 125: CA 125: 5.9 U/mL (ref 5.5–35.0)

## 2020-05-31 LAB — CBC
Hematocrit: 36.2 % (ref 35.0–45.0)
Hemoglobin: 12.9 g/dL (ref 11.7–15.5)
MCH: 35.4 pg — ABNORMAL HIGH (ref 27.0–33.0)
MCHC: 35.7 g/dL (ref 32.0–36.0)
MCV: 99.3 fL (ref 80.0–100.0)
MPV: 7 fL — ABNORMAL LOW (ref 7.5–11.5)
Platelets: 223 10E3/uL (ref 140–400)
RBC: 3.64 10E6/uL — ABNORMAL LOW (ref 3.80–5.10)
RDW: 13.4 % (ref 11.0–15.0)
WBC: 5.7 10E3/uL (ref 3.8–10.8)

## 2020-05-31 LAB — MAGNESIUM: Magnesium: 2 mg/dL (ref 1.5–2.5)

## 2020-05-31 LAB — TSH: TSH: 12.01 u[IU]/mL — ABNORMAL HIGH (ref 0.45–4.12)

## 2020-05-31 LAB — VITAMIN D 25 HYDROXY: Vit D, 25-Hydroxy: 89.7 ng/mL (ref 30.0–100.0)

## 2020-05-31 NOTE — Unmapped (Signed)
History of Present Illness  Kelsey Sullivan is a 79 y.o. female with   Chief Complaint   Patient presents with   ??? 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 She declined adjuvant therapy. In 01/2018, she experienced mid abdominal pain, and seen by PCP and ED, with CT of abdomen and pelvis showing interval development of necrotic retroperitoneal lymphadenopathy with largest node measuring 4.9  4.2 x 4.8 cm. Her CA 125 on 02/18/18 was 197.8.  She underwent a ct-guided nodal biopsy on 02/26/18, with pathology confirming recurrent disease.  She completed 6 cycles of chemotherapy with Carboplatin and Paclitaxel-weekly (C6D1 was 08/27/18). CA 125 was 8.7 at C6.     She underwent CT CAP on 09/22/2018 revealed ned in chest and interval decrease in size of a left periaortic necrotic lymph node from 1.8 to 1.6 cm. 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 PET on 10/15/18 shows reassuring intraabdominal findings with resolution of the previous hypermetabolic para-aortic lymph nodes and hepatic dome lesions, but did note uptake within the thyroid. She underwent thyroid US on 10/20/18 with a 1.2 cm irregular hypoechoic nodule in the left thyroid lobe is suspicious. Recommended FNA for further evaluation, but she did not get this done.    She initiated maintenance rubraca 11/2018 but ultimately elected to stop in 02/2019 due to side effects.     03/02/2019:   CTAP: ([place]- [place]): NED.  Partially calcified right thyroid nodule and hypodense 4 mm thyroid nodule    06/16/2019: Pt reports stopping Rubraca back in 02/2019 while down in [place] 2/2 to decreased QOL.     05/31/2020 Pt here for follow up and has been following with gyn onc in [place] q 3mos.      Kelsey Sullivan is doing well, reports being active with her dog andnow her partner's cat. She is saddened by the current health status of her partner and sister. Her partner is currently hospitalized after alcohol withdrawal and her sister is in a nursing home and has developed bed sores. She is not sure when she will return to [place]  She denies any bloating or fullness  She reports normal bowel/bladder habits.   She denies any GU concerns. Denies any dysuria, frequency, hematuria, flank pain or fevers.  She denies any vaginal bleeding or discharge.   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   She reports no leg swelling.   Her appetite is good, without nausea or vomiting.    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, ovary, omentum, cul de sac peritoneum 12/2012, plat sensitive recurrence 03/2018    Disposition: RTC 3 months with CA 125, genetic testing neg, foundation one testing results from [facility] in [place]- this was not performed   Current disease status: Neg for CS mutation, no VUS, lifetime remaining breast cancer risk -3.4%  Genetics:  negative for mutations, breast cancer lifetime risk 3.4%  Survivorship plan: pending completion of treatment        Malignant neoplasm of ovary (CMS Dx)   12/28/2012 Pathology    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    Omentum-biopsy for frozen section-metastatic adenocarcionma.  Residual right IP ligament-benign smooth muscle and fibroadiopse tissue containing thick walled blood vessels.    L ovary and fallopian tube- serous  adenocarcinoma, high grade, 4 cm, favor metastasis.  Anterior cul de sac biopsy-benign fibroadipose tissue.   Posterior cul de sac biopsy-metastatic adenocarcinoma.  Right pelvic side will biopsy- benign fibroadipose tissue.  Left pelvic side wall, right gutter and Left gutter-benign fibroadipose tissue.  Omentectomy- metastatic adenocarcinoma  LND-negative     12/28/2012 Surgery    BSO, omentectomy, LND     12/28/2012 Initial Diagnosis    Malignant neoplasm of both ovaries (CMS Dx)     01/08/2013 - 01/08/2013 Systemic Therapy (Oral and IV)    Patient refused chemotherapy and did alternative treatment including a raw diet for 1 year.       04/06/2017 Imaging    CT [name]- mild small bowel obstruction with transition point in the left pelvis adjacent to the anterior abdominal wall. Low density in the common femoral veins bilaterally thought likely to represent flow artificat, may consider follow up with lomer extremity Doppler to ensure there is no underlying deep venous thrombosis.     01/29/2018 Imaging    CT [name]-  Interval development of necrotic retroperitoneal lymphadenopathy with largest node measuring 4.9  4.2 x 4.8 cm. Origin is indeterminate.  Options would include CT directed biopsy as well as PET/CT.     02/18/2018 Tumor Markers    CA 125:  197.8     02/26/2018 Biopsy    CT guided Left retroperitoneal node biopsy- focally involved by non-small cell malignancy- additional stains are diagnostic of non-small cell carcionoma and supportive of involve70ment by the patients reported previous known ovarian malignancy.     03/04/2018 Imaging    PET/CT-  FDG avid retroperitoneal lymphadenopathy as described above and an FDG avid lesion over the dome of the liver most likely a peritoneal implant rather than a liver metastasis.  No other focal areas of FD avid malignancy are found. Specifically, no lymph node activity is found outside of the retroperitoneum and there is no evidence for FDG avid pulmonary, skeletal or hepatic  parenchymal metastasis.     04/12/2018 Procedure    IR port placed       04/15/2018 Genetic Testing    Myraid Testing-Neg     04/16/2018 - 08/27/2018 Systemic Therapy (Oral and IV)    Treatment Summary   Treatment goal Disease control   Plan Name OP Gyn CARBOplatin AUC 6 / PACLitaxel Weekly   Status Active   Start Date 04/16/2018   End Date 09/10/2018 (Planned)   Provider Mikle Bosworth, MD   Chemotherapy PACLitaxel (TAXOL) 120 mg in sodium chloride 0.9 % 250 mL chemo infusion, 80 mg/m2 = 120 mg, Intravenous, Once, 4 of 6 cycles    CARBOplatin (PARAPLATIN) 480 mg in dextrose 5% in water (D5W) 250 mL chemo infusion, 480 mg, Intravenous, Once, 4 of 6 cycles  Dose modification: 497 mg (original dose 484.8 mg, Cycle 2, Reason: Other (See Comments))         04/16/2018:   C1 weekly taxol 80 mg/m2 D1,8,15,  Carboplatin AUC 6 D1 q 21 days. PCEs on thurs, txt on fridays. EXAMS ON EVEN CYCLES     CA 125: 523.4     Genetic testing: Neg for CS mutation, no VUS, lifetime remaining breast cancer risk -3.4%    05/07/2018:   C2 weekly taxol 80 mg/m2 D1,8,15, Carboplatin AUC 6 D1 q 21 days. PCEs on thurs, txt on fridays. EXAMS ON EVEN CYCLES     CA 125: 162.2    05/20/2018:   Early PCE due to scheduling, due 6/21    05/28/2018  Delay C3 due to low ANC     CA 125: 25    06/04/2018  C3 weekly taxol 80 mg/m2 D1,8,15, Carboplatin AUC 6 D1 q 21 days. EXAMS ON EVEN CYCLES.      CA 125    06/25/2018:   HOLD chemo due to low anc (1047)- defer to next week, and change to q 28 days cycle to build in an off week to allow for counts to recover     CA 125: 10.6    07/02/2018:   C4 weekly taxol 80 mg/m2 D1,8,15, Carboplatin AUC 6 D1 q 28 days. EXAMS ON EVEN CYCLES.  Review her genetics testing results      CA 125 7.2    07/30/2018:  C5 weekly taxol 80 mg/m2 D1,8,15, Carboplatin AUC 6 D1 q 28 days. Neulasta Day 16      EXAMS ON EVEN CYCLES.      CA 125: 11.4    08/27/2018:   C6 weekly taxol 80 mg/m2 D1,8,15, Carboplatin AUC 6 D1 q 28 days. Neulasta Day  16. Order Ct CAP. RTC 10/11 (3 weeks)     EXAMS ON EVEN CYCLES. Discussed option of maintenance parp I- pt not decided (doesn't want to feel tired anymore)     CA 125: 8.7       05/28/2018 Tumor Markers    Tumor markers tested:  CA 125.  Results:   25.     06/25/2018 Tumor Markers    Tumor markers tested:  CA 125.  Results:   10.6.     07/02/2018 Tumor Markers    Tumor markers tested:  CA 125.  Results:   7.2.     07/07/2018 Hospital Admission    Admit date: 7/31 - 07/13/18  Admission diagnosis: Partial SBO  Additional comments: On 7/31??she experienced abdominal pain at home that was sharp on her LLQ and had significant diarrhea and one episode of emesis. She came to be evaluated at the [facility] clinic. She has a history of prior small bowel obstruction. She was give IL NS, 2mg  morphine IV, and 4mg  Zofran IV in clinic and admitted for further observation. The abdominal XR showed dilated bowel loops, concerning for obstruction. CT abdomen/pelvis showed dilation of the small bowel with transition point seen in the anterior mid abdomen near the midline.     07/07/2018 Imaging    Imaging Completed:  X-ray of  abdomen  Result: Nonspecific bowel gas pattern. The presence of multiple air-fluid levels in small bowel raises question of early or partial small bowel obstruction.     07/07/2018 Imaging    CTAP:Small bowel obstruction with transition point in the anterior mid abdomen near the midline. Small amount of free fluid in the abdomen, which is nonspecific. 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  Peritoneum: Small amount of free fluid in the abdomen. A para-aortic density with central  low attenuation measures 2.5 x 2 cm and is significantly decreased in size from prior.       07/30/2018 Tumor Markers    Tumor markers tested:  CA 125.  Results:   11.4.     08/27/2018 Tumor Markers    Tumor markers tested:  CA 125.  Results:   8.7.     09/20/2018 Procedure     Port removed: While the skin surface appeared inflamed, there was no evidence of infection in the port reservoir.       09/22/2018 Imaging    CT Chest-No evidence of thoracic metastatic disease.  Suspected small seroma in the base of the right neck. ??Necrotic metastatic lesion could also have this appearance but considered less likely.    [name]- Interval decrease in size of a left periaortic necrotic lymph node. No evidence of new metastatic disease.     10/15/2018 Imaging    Imaging Completed:  PET scan of  whole body  Result: Resolution of the previous hypermetabolic para-aortic lymph nodes and hepatic dome lesions.  Hypermetabolic focus within the left thyroid lobe.  Consider further evaluation with ultrasound, if clinically indicated.       10/19/2018 Imaging    Imaging Completed:  Ultrasound of  neck  Result: 1.2 cm irregular hypoechoic nodule in the left thyroid lobe is suspicious. Recommend FNA for further evaluation.  1.0 cm nodule right thyroid lobe containing macrocalcification is indeterminate. Recommend follow-up thyroid ultrasound in one year to evaluate for stability.     10/21/2018 Tumor Markers    Tumor markers tested:  CA 125.  Results:   4.8.     11/12/2018 Tumor Markers    Tumor markers tested:  CA 125  Results:   5.5     11/12/2018 - 12/03/2018 Systemic Therapy (Oral and IV)    11/12/2018: C1 rubraca 600 mg po BID. RTC 2 weeks for labs (CBC, CMP, 4 weeks for PCE #2). Pt plans to go to [place] and may leave before PCE and re-est care at [facility] (Dr. Gerilyn Pilgrim). Referral to ENT placed (pt cancelled appt for 11/22) and FNA was prev ordered for thyroid nodule- but has not been done.        03/02/2019 Imaging    CT Scan from [facility]    No evidence for recurrent or new neoplasm. Increased colonic stool could indicate constipation. Postoperative changes     06/13/2019 Tumor Markers    Tumor markers tested: CA 125 Results:   5.0.     09/15/2019 Tumor Markers    CA 125-6.1     05/31/2020 Tumor  Markers    Ca 125-5.9           Past Medical History  She  has a past medical history of Bowel obstruction (CMS Dx) (2018) and Ovarian cancer (CMS Dx) (2014).  Past Medical History:   Diagnosis Date   ??? Bowel obstruction (CMS Dx) 2018   ??? Ovarian cancer (CMS Dx) 2014       Past Surgical History  Past Surgical History:   Procedure Laterality Date   ??? ABDOMINOPLASTY  1999   ??? BLEPHAROPLASTY  2000   ??? DILATION AND CURETTAGE OF UTERUS  1979   ??? ELBOW SURGERY  1990   ??? EYE SURGERY  1994   ??? KNEE ARTHROPLASTY Right    ??? ROTATOR CUFF REPAIR     ??? TRANSUMBILICAL AUGMENTATION MAMMAPLASTY     ??? VAGINAL HYSTERECTOMY  1979  Family History  History reviewed. No pertinent family history.    Family cancer history for ovarian, uterine and colon cancer is negative other than above.       Social History  Social History     Socioeconomic History   ??? Marital status: Divorced     Spouse name: None   ??? Number of children: None   ??? Years of education: None   ??? Highest education level: None   Occupational History   ??? None   Tobacco Use   ??? Smoking status: Current Some Day Smoker     Packs/day: 0.00   ??? Smokeless tobacco: Never Used   Vaping Use   ??? Vaping Use: Never used   Substance and Sexual Activity   ??? Alcohol use: Yes   ??? Drug use: No   ??? Sexual activity: Not Currently   Other Topics Concern   ??? Caffeine Use Yes   ??? Occupational Exposure No   ??? Exercise Yes   ??? Seat Belt Yes   Social History Narrative    Mammogram-6 years ago and it was normal. Pt stated she isn't getting them anymore.      Colonoscopy-10 years ago        Pt reports she can lie flat in bed without SOB.    Pt states she can walk a flight of stairs and city block without chest pain and SOB>       Social Determinants of Health     Financial Resource Strain:    ??? Difficulty of Paying Living Expenses:    Food Insecurity:    ??? Worried About Programme researcher, broadcasting/film/video in the Last Year:    ??? Barista in the Last Year:    Transportation Needs:    ??? Freight forwarder  (Medical):    ??? Lack of Transportation (Non-Medical):    Physical Activity:    ??? Days of Exercise per Week:    ??? Minutes of Exercise per Session:    Stress:    ??? Feeling of Stress :    Social Connections:    ??? Frequency of Communication with Friends and Family:    ??? Frequency of Social Gatherings with Friends and Family:    ??? Attends Religious Services:    ??? Database administrator or Organizations:    ??? Attends Engineer, structural:    ??? Marital Status:    Intimate Programme researcher, broadcasting/film/video Violence:    ??? Fear of Current or Ex-Partner:    ??? Emotionally Abused:    ??? Physically Abused:    ??? Sexually Abused:        Past OB/GYN History  G3P2  She reports menarche at age 32 and menopause at age 10-surgical.  She denies a history of STIs.  She denies a history of abnormal cervical cytology and reports her last cytologic examination she is unsure of. She has taken HRT.     Health maintenance:  Mammogram: Date 6 years ago Results normal  Colonoscopy: Date 10 years ago Results normal    Allergies  She is allergic to bactrim [sulfamethoxazole-trimethoprim], nsaids (non-steroidal anti-inflammatory drug), celecoxib, and metoclopramide.    BMI  Body mass index is 21.43 kg/m??.                   Histories  She has a past medical history of Bowel obstruction (CMS Dx) (2018) and Ovarian cancer (CMS Dx) (2014).    She has a past surgical  history that includes Dilation and curettage of uterus (1979); Vaginal hysterectomy (1979); Elbow surgery (1990); Eye surgery (1994); Abdominoplasty (1999); Blepharoplasty (2000); Transumbilical augmentation mammaplasty; Rotator cuff repair; and Knee Arthroplasty (Right).    Her family history is not on file.    She reports that she has been smoking. She has been smoking about 0.00 packs per day. She has never used smokeless tobacco. She reports current alcohol use. She reports that she does not use drugs.    Allergies  Bactrim [sulfamethoxazole-trimethoprim], Nsaids (non-steroidal anti-inflammatory drug),  Celecoxib, and Metoclopramide    Medications  Outpatient Encounter Medications as of 05/31/2020   Medication Sig Dispense Refill   ??? ascorbic acid, vitamin C, (VITAMIN C) 1000 MG tablet Take 1,000 mg by mouth daily.     ??? chlorhexidine (PERIDEX) 0.12 % solution      ??? cholecalciferol, vitamin D3, (VITAMIN D3) 1000 units tablet Take 1 tablet (1,000 Units total) by mouth daily. 30 tablet 0   ??? digestive enzymes Cap Take by mouth.     ??? GLUTATHIONE-L ORAL Take by mouth daily.     ??? iodine-sodium iodide 2 % solution Apply topically if needed.     ??? Lactobac no.41/Bifidobact no.7 (PROBIOTIC-10 ORAL) Take 1 capsule by mouth daily. [employer] brand     ??? ondansetron (ZOFRAN) 4 MG tablet Take 1 tablet (4 mg total) by mouth every 8 hours as needed for Nausea. 20 tablet 2   ??? rucaparib (RUBRACA) 200 mg Tab Take 1 tablet (200 mg) by mouth 2 times a day. (Take with 300 mg tablet for a total dose of 500mg ) 60 tablet 11   ??? rucaparib (RUBRACA) 300 mg Tab Take 300 mg by mouth 2 times a day. (Take with 200mg  tablet for a total dose of 500mg ) Indications: malignant neoplasm of the ovary 60 tablet 11   ??? UNABLE TO FIND multimineral     ??? vit b complex w-b 12 (B COMPLEX-VITAMIN B12) tablet Take 1 tablet by mouth daily.     ??? lidocaine-prilocaine (EMLA) cream Apply topically daily as needed. Apply pea-size amount to port site 30-60 min prior to accessing port 30 g 1   ??? omeprazole (PRILOSEC) 20 MG capsule Take 20 mg by mouth daily.            ??? proCHLORPERazine (COMPAZINE) 10 MG tablet Take 1 tablet (10 mg total) by mouth every 6 hours as needed (For nausea and vomiting). 30 tablet 3   ??? vitamin E 100 UNIT capsule Take 100 Units by mouth daily.       No facility-administered encounter medications on file as of 05/31/2020.        Review of Systems   see HPI    Vitals  Blood pressure 153/75, pulse 81, temperature 97.4 ??F (36.3 ??C), temperature source Temporal, resp. rate 16, height 5' 3 (1.6 m), weight 121 lb (54.9 kg).    Physical  Exam   Vitals reviewed.  Constitutional: She is oriented to person, place, and time. She appears well-developed.   HENT:   Head: Normocephalic and atraumatic.   Eyes: Conjunctivae are normal.   Neck: No thyromegaly present.   Cardiovascular: Normal rate, regular rhythm and normal heart sounds.   Pulmonary/Chest: Effort normal and breath sounds normal. No respiratory distress. She has no wheezes. She has no rales.   Abdominal: Soft. Bowel sounds are normal. She exhibits no distension and no mass. There is no abdominal tenderness. There is no rebound and no guarding.  def   Genitourinary:    Genitourinary Comments: Normal external genitalia/vulva.  Normal urethral meatus, urethra, bladder, anus and perineum.  Normal vagina and vaginal cuff.  Uterus, cervix and adnexa surgically absent.  Normal rectum.  There is no nodularity in the posterior culdesac or rectovaginal septum.         Musculoskeletal:         General: Normal range of motion.      Cervical back: Normal range of motion and neck supple.   Lymphadenopathy:     She has no cervical adenopathy.   Neurological: She is alert and oriented to person, place, and time.   Skin: Skin is warm and dry.   Psychiatric: Her behavior is normal. Judgment and thought content normal.          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     Posterior cul de sac biopsy-metastatic adenocarcinoma.     Omentectomy- metastatic adenocarcinoma     LND-negative     Residual right IP ligament, Anterior cul de sac biopsy, right pelvic side will biopsy, left pelvic side wall, right gutter and left gutter-->benign    04/06/2017:   CT [name]- mild small bowel obstruction with transition point in the left pelvis adjacent to the anterior abdominal wall. Low density in the common femoral veins bilaterally thought likely to represent flow artificat, may consider follow up with lomer extremity Doppler to ensure there is no underlying deep venous thrombosis.    01/29/2018:   CTAP:  Interval development of necrotic retroperitoneal lymphadenopathy with largest node measuring 4.9  4.2 x 4.8 cm. Origin is indeterminate.  Options would include CT directed biopsy as well as PET/CT.    02/18/2018:   CA 125:  197.8    02/26/2018:   CT guided Left retroperitoneal node biopsy- focally involved by non-small cell malignancy- additional stains are diagnostic of non-small cell carcionoma and supportive of involve17ment by the patients reported previous known ovarian malignancy.    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  No other focal areas of FD avid malignancy are found. Specifically, no lymph node activity is found outside of the retroperitoneum and there is no evidence for FDG avid pulmonary, skeletal or hepatic parenchymal metastasis.    03/02/2019: CT chest/abn pelvis (Outside record from [place] [facility]): no mediastinal, supracalvicular or axillary adenopathy, partly calcified right thyroid nodule (hypodense and 4mm), small  emphysematous blebs in LLL. No retroperitoneal, intra-abdominal, pelvic or inguinal adenopathy, no  evidence for peritoneal surface neoplastic involvement. 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 She completed 6 cycles of chemotherapy with Carboplatin and Paclitaxel-weekly (C6D1 was 08/27/18). CA 125 was 8.7 at C6.     She underwent CT CAP on 09/22/2018 revealed ned in chest and interval decrease in size of a left periaortic necrotic lymph node from 1.8 to 1.6 cm. 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     PET on 10/15/18 shows reassuring intraabdominal findings with resolution of the previous hypermetabolic para-aortic lymph nodes and hepatic dome lesions, but did note uptake within the thyroid. She underwent thyroid US on 10/20/18 with a 1.2 cm irregular hypoechoic nodule in the left thyroid lobe is suspicious. Recommend FNA for further evaluation.     She initiated maintenance rubraca 11/2018 but ultimately elected to stop in 02/2019 due to side effects.   Patient had reestarted drug at 500 mg po BID once she got down to [place]-. Was in [place] from 12/2018-05/2019. Reports her oncologist down in [place] had changed doses of Rubraca a few times, most  to 300mg  po BID but decided to stop drug all together back in 02/2019 2/2 to decreased QOL. She does not wish to restart drug at this time. Last CT scan 03/02/2019 in [place], no evidence of recurrent or new neoplasm.     __6/24/2021- labs wnl; CA  125 5.9 stable; Plan for RTC in 3 mos, with  labs  __need thyroid FNA- ordered (see below)    Thyroid Nodule:   Incidental finding on PET scan of hypermetabolic focus within left thyroid nodule. Ultrasound performed on 10/20/18 with 1.2 cm irregular hypoechoic nodule, appearing suspicious. Recommend FNA for further evaluation, which was ordered. Pt has not followed up to date.  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 Would still recommend follow up with ENT  __FNA ordered  __ [facility] today 12.01  __referral to ENT made, pt has not yet seen them     Genetics:  previously discussed her negative genetic testing results.    Pain    Pain Score: Zero (05/31/2020 10:28 AM)    She denies presence of pain.  Pain will be reassessed at next visit.               I saw and personally examined the patient today . I discussed the findings and therapeutic plan with the patient. I repeated, reviewed and agree with the history of present illness, past medical histories, family history, social history, medication list, and allergies as listed. The review of systems is as noted above. My physical exam confirms the findings listed above. Review of labs, pathology reports, radiograph reports, and medical records confirm  the findings noted above. I agree with the assessment and plan as noted above. I have edited the note where appropriate.     I have personally performed a face to face diagnostic evaluation on this patient. I have personally developed the care plan as outlined in the Assessment and Plan.      Complexity: mos     I spent a total of 30 minutes was spent prior to, during and after the patient encounter in counseling and/or coordination of care, documentation and counseling with patient and/or family.       Topics discussed include ovarian Cancer prognosis and treatment options and surveillance.      Maggie Font, MD, FACOG  Associate Professor, Obstetrics and Gynecology  Division of Gynecologic  Oncology        Medical Decision Making:  The following items were considered in medical decision making:  Review / order clinical lab tests  Review / order radiology tests  Review / order other diagnostic tests/interventions

## 2020-08-16 ENCOUNTER — Ambulatory Visit: Admit: 2020-08-16 | Payer: Medicare (Managed Care)

## 2020-08-16 DIAGNOSIS — C569 Malignant neoplasm of unspecified ovary: Secondary | ICD-10-CM

## 2020-08-16 DIAGNOSIS — E039 Hypothyroidism, unspecified: Secondary | ICD-10-CM

## 2020-08-16 LAB — TSH: TSH: 5.91 u[IU]/mL — ABNORMAL HIGH (ref 0.45–4.12)

## 2020-08-16 LAB — CA 125: CA 125: 6.2 U/mL (ref 5.5–35.0)

## 2020-08-16 LAB — T4, FREE: Free T4: 0.71 ng/dL (ref 0.61–1.76)

## 2020-08-16 LAB — T3: T3, Total: 72 ng/dL (ref 60.0–220.0)

## 2020-08-16 NOTE — Unmapped (Signed)
History of Present Illness  Kelsey Sullivan is a 79 y.o. female with   Chief Complaint   Patient presents with   ??? 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 She declined adjuvant therapy. In 01/2018, she experienced mid abdominal pain, and seen by PCP and ED, with CT of abdomen and pelvis showing interval development of necrotic retroperitoneal lymphadenopathy with largest node measuring 4.9  4.2 x 4.8 cm. Her CA 125 on 02/18/18 was 197.8.  She underwent a ct-guided nodal biopsy on 02/26/18, with pathology confirming recurrent disease.  She completed 6 cycles of chemotherapy with Carboplatin and Paclitaxel-weekly (C6D1 was 08/27/18). CA 125 was 8.7 at C6.     She underwent CT CAP on 09/22/2018 revealed ned in chest and interval decrease in size of a left periaortic necrotic lymph node from 1.8 to 1.6 cm. 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 PET on 10/15/18 shows reassuring intraabdominal findings with resolution of the previous hypermetabolic para-aortic lymph nodes and hepatic dome lesions, but did note uptake within the thyroid. She underwent thyroid US on 10/20/18 with a 1.2 cm irregular hypoechoic nodule in the left thyroid lobe is suspicious. Recommended FNA for further evaluation, but she did not get this done.    She initiated maintenance rubraca 11/2018 but ultimately elected to stop in 02/2019 due to side effects.     03/02/2019:   CTAP: ([place]- [place]): NED. Partially  calcified right thyroid nodule and hypodense 4 mm thyroid nodule    06/16/2019: Pt reports stopping Rubraca back in 02/2019 while down in [place] 2/2 to decreased QOL.     Pt here for surveillance      Kelsey Sullivan is doing well, but reports she feels overwhelmed and stressed recently. Her partner is currently hospitalized after alcohol withdrawal and her sister is in a nursing home and has developed bed sores. She states her sister's health continues to deteriorate. She had a fall ~70months ago, hit her head and had to get stitches. She reports she is healing well now. She is excited about starting swing dancing.   She denies any bloating or fullness  She reports normal bowel/bladder habits.   She denies any GU concerns. Denies any dysuria, frequency, hematuria, flank pain or fevers.  She denies any vaginal bleeding or discharge.   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   She reports no leg swelling.   Her appetite is good, without nausea or vomiting.    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, ovary, omentum, cul de sac peritoneum 12/2012, plat sensitive recurrence 03/2018    Disposition: RTC 3 months with CA 125, genetic testing neg, foundation one testing results from [facility] in [place]- this was not performed   Current disease status: Neg for CS mutation, no VUS, lifetime remaining breast cancer risk -3.4%  Genetics: negative  for mutations, breast cancer lifetime risk 3.4%  Survivorship plan: pending completion of treatment        Malignant neoplasm of ovary (CMS Dx)   12/28/2012 Pathology    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    Omentum-biopsy for frozen section-metastatic adenocarcionma.  Residual right IP ligament-benign smooth muscle and fibroadiopse tissue containing thick walled blood vessels.    L ovary and fallopian  tube- serous adenocarcinoma, high grade, 4 cm, favor metastasis.  Anterior cul de sac biopsy-benign fibroadipose tissue.   Posterior cul de sac biopsy-metastatic adenocarcinoma.  Right pelvic side will biopsy- benign fibroadipose tissue.  Left pelvic side wall, right gutter and Left gutter-benign fibroadipose tissue.  Omentectomy- metastatic adenocarcinoma  LND-negative     12/28/2012 Surgery    BSO, omentectomy, LND     12/28/2012 Initial Diagnosis    Malignant neoplasm of both ovaries (CMS Dx)     01/08/2013 - 01/08/2013 Systemic Therapy (Oral and IV)    Patient refused chemotherapy and did alternative treatment including a raw diet for 1 year.       04/06/2017 Imaging    CT [name]- mild small bowel obstruction with transition point in the left pelvis adjacent to the anterior abdominal wall. Low density in the common femoral veins bilaterally thought likely to represent flow artificat, may consider follow up with lomer extremity Doppler to ensure there is no underlying deep venous thrombosis.     01/29/2018 Imaging    CT [name]-  Interval development of necrotic retroperitoneal lymphadenopathy with largest node measuring 4.9  4.2 x 4.8 cm. Origin is indeterminate.  Options would include CT directed biopsy as well as PET/CT.     02/18/2018 Tumor Markers    CA 125:  197.8     02/26/2018 Biopsy    CT guided Left retroperitoneal node biopsy- focally involved by non-small cell malignancy- additional stains are diagnostic of non-small cell carcionoma and supportive of involve49ment by the patients reported previous known ovarian malignancy.     03/04/2018 Imaging    PET/CT-  FDG avid retroperitoneal lymphadenopathy as described above and an FDG avid lesion over the dome of the liver most likely a peritoneal implant rather than a liver metastasis.  No other focal areas of FD avid malignancy are found. Specifically, no lymph node activity is found outside of the retroperitoneum and there is no evidence for FDG avid pulmonary, skeletal or  hepatic parenchymal metastasis.     04/12/2018 Procedure    IR port placed       04/15/2018 Genetic Testing    Myraid Testing-Neg     04/16/2018 - 08/27/2018 Systemic Therapy (Oral and IV)    Treatment Summary   Treatment goal Disease control   Plan Name OP Gyn CARBOplatin AUC 6 / PACLitaxel Weekly   Status Active   Start Date 04/16/2018   End Date 09/10/2018 (Planned)   Provider Mikle Bosworth, MD   Chemotherapy PACLitaxel (TAXOL) 120 mg in sodium chloride 0.9 % 250 mL chemo infusion, 80 mg/m2 = 120 mg, Intravenous, Once, 4 of 6 cycles    CARBOplatin (PARAPLATIN) 480 mg in dextrose 5% in water (D5W) 250 mL chemo infusion, 480 mg, Intravenous, Once, 4 of 6 cycles  Dose modification: 497 mg (original dose 484.8 mg, Cycle 2, Reason: Other (See Comments))         04/16/2018:   C1 weekly taxol 80 mg/m2 D1,8,15, Carboplatin  AUC 6 D1 q 21 days. PCEs on thurs, txt on fridays. EXAMS ON EVEN CYCLES     CA 125: 523.4     Genetic testing: Neg for CS mutation, no VUS, lifetime remaining breast cancer risk -3.4%    05/07/2018:   C2 weekly taxol 80 mg/m2 D1,8,15, Carboplatin AUC 6 D1 q 21 days. PCEs on thurs, txt on fridays. EXAMS ON EVEN CYCLES     CA 125: 162.2    05/20/2018:   Early PCE due to scheduling, due 6/21    05/28/2018  Delay C3 due to low ANC     CA 125: 25    06/04/2018  C3 weekly taxol 80 mg/m2 D1,8,15, Carboplatin AUC 6 D1 q 21 days. EXAMS ON EVEN CYCLES.      CA 125    06/25/2018:   HOLD chemo due to low anc (1047)- defer to next week, and change to q 28 days cycle to build in an off week to allow for counts to recover     CA 125: 10.6    07/02/2018:   C4 weekly taxol 80 mg/m2 D1,8,15, Carboplatin AUC 6 D1 q 28 days. EXAMS ON EVEN CYCLES.  Review her genetics testing results      CA 125 7.2    07/30/2018:  C5 weekly taxol 80 mg/m2 D1,8,15, Carboplatin AUC 6 D1 q 28 days. Neulasta Day 16      EXAMS ON EVEN CYCLES.      CA 125: 11.4    08/27/2018:   C6 weekly taxol 80 mg/m2 D1,8,15, Carboplatin AUC 6 D1 q 28 days.  Neulasta Day 16. Order Ct CAP. RTC 10/11 (3 weeks)     EXAMS ON EVEN CYCLES. Discussed option of maintenance parp I- pt not decided (doesn't want to feel tired anymore)     CA 125: 8.7       05/28/2018 Tumor Markers    Tumor markers tested:  CA 125.  Results:   25.     06/25/2018 Tumor Markers    Tumor markers tested:  CA 125.  Results:   10.6.     07/02/2018 Tumor Markers    Tumor markers tested:  CA 125.  Results:   7.2.     07/07/2018 Hospital Admission    Admit date: 7/31 - 07/13/18  Admission diagnosis: Partial SBO  Additional comments: On 7/31??she experienced abdominal pain at home that was sharp on her LLQ and had significant diarrhea and one episode of emesis. She came to be evaluated at the [facility] clinic. She has a history of prior small bowel obstruction. She was give IL NS, 2mg  morphine IV, and 4mg  Zofran IV in clinic and admitted for further observation. The abdominal XR showed dilated bowel loops, concerning for obstruction. CT abdomen/pelvis showed dilation of the small bowel with transition point seen in the anterior mid abdomen near the midline.     07/07/2018 Imaging    Imaging Completed:  X-ray of  abdomen  Result: Nonspecific bowel gas pattern. The presence of multiple air-fluid levels in small bowel raises question of early or partial small bowel obstruction.     07/07/2018 Imaging    CTAP:Small bowel obstruction with transition point in the anterior mid abdomen near the midline. Small amount of free fluid in the abdomen, which is nonspecific. 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  Peritoneum: Small amount of free fluid in the abdomen. A para-aortic density with central low  attenuation measures 2.5 x 2 cm and is significantly decreased in size from prior.       07/30/2018 Tumor Markers    Tumor markers tested:  CA 125.  Results:   11.4.     08/27/2018 Tumor Markers    Tumor markers tested:  CA 125.  Results:   8.7.      09/20/2018 Procedure    Port removed: While the skin surface appeared inflamed, there was no evidence of infection in the port reservoir.       09/22/2018 Imaging    CT Chest-No evidence of thoracic metastatic disease.  Suspected small seroma in the base of the right neck. ??Necrotic metastatic lesion could also have this appearance but considered less likely.    [name]- Interval decrease in size of a left periaortic necrotic lymph node. No evidence of new metastatic disease.     10/15/2018 Imaging    Imaging Completed:  PET scan of  whole body  Result: Resolution of the previous hypermetabolic para-aortic lymph nodes and hepatic dome lesions.  Hypermetabolic focus within the left thyroid lobe.  Consider further evaluation with ultrasound, if clinically indicated.       10/19/2018 Imaging    Imaging Completed:  Ultrasound of  neck  Result: 1.2 cm irregular hypoechoic nodule in the left thyroid lobe is suspicious. Recommend FNA for further evaluation.  1.0 cm nodule right thyroid lobe containing macrocalcification is indeterminate. Recommend follow-up thyroid ultrasound in one year to evaluate for stability.     10/21/2018 Tumor Markers    Tumor markers tested:  CA 125.  Results:   4.8.     11/12/2018 Tumor Markers    Tumor markers tested:  CA 125  Results:   5.5     11/12/2018 - 12/03/2018 Systemic Therapy (Oral and IV)    11/12/2018: C1 rubraca 600 mg po BID. RTC 2 weeks for labs (CBC, CMP, 4 weeks for PCE #2). Pt plans to go to [place] and may leave before PCE and re-est care at [facility] (Dr. Gerilyn Pilgrim). Referral to ENT placed (pt cancelled appt for 11/22) and FNA was prev ordered for thyroid nodule- but has not been done.        03/02/2019 Imaging    CT Scan from [facility]    No evidence for recurrent or new neoplasm. Increased colonic stool could indicate constipation. Postoperative changes     06/13/2019 Tumor Markers    Tumor markers tested: CA 125 Results:   5.0.     09/15/2019 Tumor Markers    CA 125-6.1      05/31/2020 Tumor Markers    Ca 125-5.9     08/16/2020 Tumor Markers    CA 125-pending           Past Medical History  She  has a past medical history of Bowel obstruction (CMS Dx) (2018) and Ovarian cancer (CMS Dx) (2014).  Past Medical History:   Diagnosis Date   ??? Bowel obstruction (CMS Dx) 2018   ??? Ovarian cancer (CMS Dx) 2014       Past Surgical History  Past Surgical History:   Procedure Laterality Date   ??? ABDOMINOPLASTY  1999   ??? BLEPHAROPLASTY  2000   ??? DILATION AND CURETTAGE OF UTERUS  1979   ??? ELBOW SURGERY  1990   ??? EYE SURGERY  1994   ??? KNEE ARTHROPLASTY Right    ??? ROTATOR CUFF REPAIR     ??? TRANSUMBILICAL AUGMENTATION MAMMAPLASTY     ???  VAGINAL HYSTERECTOMY  1979       Family History  History reviewed. No pertinent family history.    Family cancer history for ovarian, uterine and colon cancer is negative other than above.       Social History  Social History     Socioeconomic History   ??? Marital status: Divorced     Spouse name: None   ??? Number of children: None   ??? Years of education: None   ??? Highest education level: None   Occupational History   ??? None   Tobacco Use   ??? Smoking status: Current Some Day Smoker     Packs/day: 0.00   ??? Smokeless tobacco: Never Used   Vaping Use   ??? Vaping Use: Never used   Substance and Sexual Activity   ??? Alcohol use: Yes   ??? Drug use: No   ??? Sexual activity: Not Currently   Other Topics Concern   ??? Caffeine Use Yes   ??? Occupational Exposure No   ??? Exercise Yes   ??? Seat Belt Yes   Social History Narrative    Mammogram-6 years ago and it was normal. Pt stated she isn't getting them anymore.      Colonoscopy-10 years ago        Pt reports she can lie flat in bed without SOB.    Pt states she can walk a flight of stairs and city block without chest pain and SOB>       Social Determinants of Health     Financial Resource Strain:    ??? Difficulty of Paying Living Expenses:    Physical Activity:    ??? Days of Exercise per Week:    ??? Minutes of Exercise per Session:    Stress:     ??? Feeling of Stress :    Social Connections:    ??? Frequency of Communication with Friends and Family:    ??? Frequency of Social Gatherings with Friends and Family:    ??? Attends Religious Services:    ??? Database administrator or Organizations:    ??? Attends Banker Meetings:    ??? Marital Status:        Past OB/GYN History  G3P2  She reports menarche at age 80 and menopause at age 79-surgical.  She denies a history of STIs.  She denies a history of abnormal cervical cytology and reports her last cytologic examination she is unsure of. She has taken HRT.     Health maintenance:  Mammogram: Date 6 years ago Results normal  Colonoscopy: Date 10 years ago Results normal    Allergies  She is allergic to bactrim [sulfamethoxazole-trimethoprim], nsaids (non-steroidal anti-inflammatory drug), celecoxib, and metoclopramide.    BMI  Body mass index is 21.18 kg/m??.    Disease Status: No evidence of disease/remission (05/31/2020  2:40 PM)              Histories  She has a past medical history of Bowel obstruction (CMS Dx) (2018) and Ovarian cancer (CMS Dx) (2014).    She has a past surgical history that includes Dilation and curettage of uterus (1979); Vaginal hysterectomy (1979); Elbow surgery (1990); Eye surgery (1994); Abdominoplasty (1999); Blepharoplasty (2000); Transumbilical augmentation mammaplasty; Rotator cuff repair; and Knee Arthroplasty (Right).    Her family history is not on file.    She reports that she has been smoking. She has been smoking about 0.00 packs per day. She has never used smokeless tobacco.  She reports current alcohol use. She reports that she does not use drugs.    Allergies  Bactrim [sulfamethoxazole-trimethoprim], Nsaids (non-steroidal anti-inflammatory drug), Celecoxib, and Metoclopramide    Medications  Outpatient Encounter Medications as of 08/16/2020   Medication Sig Dispense Refill   ??? ascorbic acid, vitamin C, (VITAMIN C) 1000 MG tablet Take 1,000 mg by mouth daily.     ???  chlorhexidine (PERIDEX) 0.12 % solution      ??? cholecalciferol, vitamin D3, (VITAMIN D3) 1000 units tablet Take 1 tablet (1,000 Units total) by mouth daily. 30 tablet 0   ??? digestive enzymes Cap Take by mouth.     ??? GLUTATHIONE-L ORAL Take by mouth daily.     ??? iodine-sodium iodide 2 % solution Apply topically if needed.     ??? Lactobac no.41/Bifidobact no.7 (PROBIOTIC-10 ORAL) Take 1 capsule by mouth daily. [employer] brand     ??? ondansetron (ZOFRAN) 4 MG tablet Take 1 tablet (4 mg total) by mouth every 8 hours as needed for Nausea. 20 tablet 2   ??? rucaparib (RUBRACA) 200 mg Tab Take 1 tablet (200 mg) by mouth 2 times a day. (Take with 300 mg tablet for a total dose of 500mg ) 60 tablet 11   ??? rucaparib (RUBRACA) 300 mg Tab Take 300 mg by mouth 2 times a day. (Take with 200mg  tablet for a total dose of 500mg ) Indications: malignant neoplasm of the ovary 60 tablet 11   ??? UNABLE TO FIND multimineral     ??? vit b complex w-b 12 (B COMPLEX-VITAMIN B12) tablet Take 1 tablet by mouth daily.     ??? lidocaine-prilocaine (EMLA) cream Apply topically daily as needed. Apply pea-size amount to port site 30-60 min prior to accessing port 30 g 1   ??? omeprazole (PRILOSEC) 20 MG capsule Take 20 mg by mouth daily.            ??? proCHLORPERazine (COMPAZINE) 10 MG tablet Take 1 tablet (10 mg total) by mouth every 6 hours as needed (For nausea and vomiting). 30 tablet 3   ??? vitamin E 100 UNIT capsule Take 100 Units by mouth daily.       No facility-administered encounter medications on file as of 08/16/2020.        Review of Systemssee HPI    Vitals  Blood pressure 138/79, pulse 68, resp. rate 14, height 5' 4 (1.626 m), weight 123 lb 6.4 oz (56 kg), SpO2 99 %.    Physical Exam   Vitals reviewed.  Constitutional: She is oriented to person, place, and time. She appears well-developed.   HENT:   Head: Normocephalic and atraumatic.   Eyes: Conjunctivae are normal.   Neck: No thyromegaly present.   Cardiovascular: Normal rate, regular rhythm  and normal heart sounds.   Pulmonary/Chest: Effort normal and breath sounds normal. No respiratory distress. She has no wheezes. She has no rales.   Abdominal: Soft. Bowel sounds are normal. She exhibits no distension and no mass. There is no abdominal tenderness. There is no rebound and no guarding.   def   Genitourinary:    Genitourinary Comments: Normal external genitalia/vulva.  Normal urethral meatus, urethra, bladder, anus and perineum.  Normal vagina and vaginal cuff.  Uterus, cervix and adnexa surgically absent.  Normal rectum.  There is no nodularity in the posterior culdesac or rectovaginal septum.         Musculoskeletal:         General: Normal range of motion.  Cervical back: Normal range of motion and neck supple.   Lymphadenopathy:     She has no cervical adenopathy.   Neurological: She is alert and oriented to person, place, and time.   Skin: Skin is warm and dry.   Psychiatric: Her behavior is normal. Judgment and thought content normal.          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     Posterior cul de sac biopsy-metastatic adenocarcinoma.     Omentectomy- metastatic adenocarcinoma     LND-negative     Residual right IP ligament, Anterior cul de sac  biopsy, right pelvic side will biopsy, left pelvic side wall, right gutter and left gutter-->benign    04/06/2017:   CT [name]- mild small bowel obstruction with transition point in the left pelvis adjacent to the anterior abdominal wall. Low density in the common femoral veins bilaterally thought likely to represent flow artificat, may consider follow up with lomer extremity Doppler to ensure there is no underlying deep venous thrombosis.    01/29/2018:   CTAP:  Interval development of necrotic retroperitoneal lymphadenopathy with largest node measuring 4.9  4.2 x 4.8 cm. Origin is indeterminate.  Options would include CT directed biopsy as well as PET/CT.    02/18/2018:   CA 125:  197.8    02/26/2018:   CT guided Left retroperitoneal node biopsy- focally involved by non-small cell malignancy- additional stains are diagnostic of non-small cell carcionoma and supportive of involve5ment by the patients reported previous known ovarian malignancy.    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  No other focal areas of FD avid malignancy are found. Specifically, no lymph node activity is found outside of the retroperitoneum and there is no evidence for FDG avid pulmonary, skeletal or hepatic parenchymal metastasis.    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 No retroperitoneal, intra-abdominal, pelvic or inguinal adenopathy, no evidence for peritoneal surface neoplastic involvement. No evidence for recurrent or new neoplasm    Cancer Staging:  Cancer Staging  Malignant neoplasm of ovary (CMS Dx)  Staging form: Ovary, Fallopian Tube, And Primary Peritoneal Carcinoma, AJCC 8th Edition  -  Clinical: FIGO Stage IIIC - Signed by Mikle Bosworth, MD on 04/01/2018      Disease Status:  No evidence of disease/remission (05/31/2020  2:40 PM)               Assessment & Plan  My impression is Ms. 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 She completed 6 cycles of chemotherapy with Carboplatin and Paclitaxel-weekly (C6D1 was 08/27/18). CA 125 was 8.7 at C6.     She underwent CT CAP on 09/22/2018 revealed ned in chest and interval decrease in size of a left periaortic necrotic lymph node from 1.8 to 1.6 cm. 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     PET on 10/15/18 shows reassuring intraabdominal findings with resolution of the previous hypermetabolic para-aortic lymph nodes and hepatic dome lesions, but did note uptake within the thyroid. She underwent thyroid US on 10/20/18 with a 1.2 cm irregular hypoechoic nodule in the left thyroid lobe is suspicious. Recommend FNA for further evaluation.     She initiated maintenance rubraca 11/2018 but ultimately elected to stop in 02/2019 due to side effects.   Patient had reestarted drug at 500 mg po BID once she got down to [place]-. Was in [place] from 12/2018-05/2019. Reports her oncologist down in [place] had changed doses of Rubraca a few times, to 300mg  po BID but decided to stop drug all together back in 02/2019 2/2 to decreased QOL. She does not wish to restart drug at this time. Last CT scan 03/02/2019 in [place], no evidence of recurrent or new neoplasm.     __9/08/2020- patient doing well, benign exam. CA-125 pending. Plan for RTC in 3 mos, with labs    Thyroid Nodule:   Incidental finding on PET scan of hypermetabolic focus within left thyroid nodule. Ultrasound performed on  10/20/18 with 1.2 cm irregular hypoechoic nodule, appearing suspicious. Recommend FNA for further evaluation, which was ordered. Pt has not followed up to date.  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 Referral to ENT made, but patient did not see them. She followed up with her PCP and was started on Synthroid. Last TSH 12.01. Repeat TSH/T4/T3 labs pending     Genetics:  previously discussed her negative genetic testing results.    Pain    Pain Score: Zero (08/16/2020 10:21 AM)    She denies presence of pain.  Pain will be reassessed at next visit.      Dispo: RTC in 27mo with [place] or in [place], MD  Obstetrics and Gynecology PGY-2      I saw and personally examined the patient today with my resident. I discussed the findings and therapeutic plan with the patient. I repeated, reviewed and agree with the history of present illness, past medical histories, family history, social history, medication list, and allergies as listed. The review of systems is as noted above. My physical exam confirms the findings listed above. Review of labs, pathology reports, radiograph reports, and medical records confirm the findings noted above. I agree with the assessment and plan as noted above. I have edited the note where appropriate.     I have personally performed a face to face diagnostic evaluation on this patient, seen initially by the resident. I have personally developed the care plan as  outlined in the Assessment and Plan.      Complexity: mod     I spent a total of 35 minutes was spent prior to, during and after the patient encounter in counseling and/or coordination of care, documentation and counseling with patient and/or family.      Topics discussed include FT  Cancer prognosis and treatment options and management of surveillance.      Kelsey Font, MD, FACOG  Associate Professor, Obstetrics and Gynecology  Division of Gynecologic Oncology    Medical Decision Making:  The  following items were considered in medical decision making:  Review / order clinical lab tests  Review / order radiology tests  Review / order other diagnostic tests/interventions

## 2020-11-15 ENCOUNTER — Ambulatory Visit: Admit: 2020-11-15 | Payer: Medicare (Managed Care)

## 2020-11-15 DIAGNOSIS — C569 Malignant neoplasm of unspecified ovary: Secondary | ICD-10-CM

## 2020-11-15 DIAGNOSIS — E039 Hypothyroidism, unspecified: Secondary | ICD-10-CM

## 2020-11-15 LAB — TSH: TSH: 2.62 u[IU]/mL (ref 0.45–4.12)

## 2020-11-15 LAB — CA 125: CA 125: 5.9 U/mL (ref 5.5–35.0)

## 2020-11-15 LAB — T3: T3, Total: 63.3 ng/dL (ref 60.0–220.0)

## 2020-11-15 LAB — T4, FREE: Free T4: 0.82 ng/dL (ref 0.61–1.76)

## 2020-11-15 NOTE — Unmapped (Signed)
History of Present Illness  Kelsey Sullivan is a 79 y.o. female with   Chief Complaint   Patient presents with   ??? 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 She declined adjuvant therapy. In 01/2018, she experienced mid abdominal pain, and seen by PCP and ED, with CT of abdomen and pelvis showing interval development of necrotic retroperitoneal lymphadenopathy with largest node measuring 4.9  4.2 x 4.8 cm. Her CA 125 on 02/18/18 was 197.8.  She underwent a ct-guided nodal biopsy on 02/26/18, with pathology confirming recurrent disease.  She completed 6 cycles of chemotherapy with Carboplatin and Paclitaxel-weekly (C6D1 was 08/27/18). CA 125 was 8.7 at C6.     She underwent CT CAP on 09/22/2018 revealed ned in chest and interval decrease in size of a left periaortic necrotic lymph node from 1.8 to 1.6 cm. 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 PET on 10/15/18 shows reassuring intraabdominal findings with resolution of the previous hypermetabolic para-aortic lymph nodes and hepatic dome lesions, but did note uptake within the thyroid. She underwent thyroid US on 10/20/18 with a 1.2 cm irregular hypoechoic nodule in the left thyroid lobe is suspicious. Recommended FNA for further evaluation, but she did not get this done.    She initiated maintenance rubraca 11/2018 but ultimately elected to stop in 02/2019 due to side effects.     03/02/2019:   CTAP: ([place]- [place]): NED.  Partially calcified right thyroid nodule and hypodense 4 mm thyroid nodule    06/16/2019: Pt reports stopping Rubraca back in 02/2019 while down in [place] 2/2 to decreased QOL.     Pt here for surveillance      Kelsey Sullivan is doing well, but reports she feels overwhelmed and stressed recently, taking care of her sister in a nursing home who continues to deteriorate as well as her significant other's son.   She reports physically she is feeling well and has no concerns.   She denies any bloating or fullness  She reports normal bowel/bladder habits.   She denies any GU concerns. Denies any dysuria, frequency, hematuria, flank pain or fevers.  She denies any vaginal bleeding or discharge.   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   She reports no leg swelling.   Her appetite is good, without nausea or vomiting.    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, ovary, omentum, cul de sac peritoneum 12/2012, plat sensitive recurrence 03/2018    Disposition: RTC 3 months with CA 125, genetic testing neg, foundation one testing results from [facility] in [place]- this was not performed   Current disease status: Neg for CS mutation, no VUS, lifetime remaining breast cancer risk -3.4%  Genetics: negative for mutations, breast cancer lifetime risk 3.4%  Survivorship plan: pending completion of treatment        Malignant neoplasm of  ovary (CMS Dx)   12/28/2012 Pathology    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    Omentum-biopsy for frozen section-metastatic adenocarcionma.  Residual right IP ligament-benign smooth muscle and fibroadiopse tissue containing thick walled blood vessels.    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  Anterior cul de sac biopsy-benign fibroadipose tissue.   Posterior cul de sac  biopsy-metastatic adenocarcinoma.  Right pelvic side will biopsy- benign fibroadipose tissue.  Left pelvic side wall, right gutter and Left gutter-benign fibroadipose tissue.  Omentectomy- metastatic adenocarcinoma  LND-negative     12/28/2012 Surgery    BSO, omentectomy, LND     12/28/2012 Initial Diagnosis    Malignant neoplasm of both ovaries (CMS Dx)     01/08/2013 - 01/08/2013 Systemic Therapy (Oral and IV)    Patient refused chemotherapy and did alternative treatment including a raw diet for 1 year.       04/06/2017 Imaging    CT [name]- mild small bowel obstruction with transition point in the left pelvis adjacent to the anterior abdominal wall. Low density in the common femoral veins bilaterally thought likely to represent flow artificat, may consider follow up with lomer extremity Doppler to ensure there is no underlying deep venous thrombosis.     01/29/2018 Imaging    CT [name]-  Interval development of necrotic retroperitoneal lymphadenopathy with largest node measuring 4.9  4.2 x 4.8 cm. Origin is indeterminate.  Options would include CT directed biopsy as well as PET/CT.     02/18/2018 Tumor Markers    CA 125:  197.8     02/26/2018 Biopsy    CT guided Left retroperitoneal node biopsy- focally involved by non-small cell malignancy- additional stains are diagnostic of non-small cell carcionoma and supportive of involve10ment by the patients reported previous known ovarian malignancy.     03/04/2018 Imaging    PET/CT-  FDG avid retroperitoneal lymphadenopathy as described above and an FDG avid lesion over the dome of the liver most likely a peritoneal implant rather than a liver metastasis.  No other focal areas of FD avid malignancy are found. Specifically, no lymph node activity is found outside of the retroperitoneum and there is no evidence for FDG avid pulmonary, skeletal or hepatic parenchymal metastasis.     04/12/2018 Procedure    IR port placed       04/15/2018 Genetic Testing    Myraid Testing-Neg     04/16/2018 -  08/27/2018 Systemic Therapy (Oral and IV)    Treatment Summary   Treatment goal Disease control   Plan Name OP Gyn CARBOplatin AUC 6 / PACLitaxel Weekly   Status Active   Start Date 04/16/2018   End Date 09/10/2018 (Planned)   Provider Mikle Bosworth, MD   Chemotherapy PACLitaxel (TAXOL) 120 mg in sodium chloride 0.9 % 250 mL chemo infusion, 80 mg/m2 = 120 mg, Intravenous, Once, 4 of 6 cycles    CARBOplatin (PARAPLATIN) 480 mg in dextrose 5% in water (D5W) 250 mL chemo infusion, 480 mg, Intravenous, Once, 4 of 6 cycles  Dose modification: 497 mg (original dose 484.8 mg, Cycle 2, Reason: Other (See Comments))         04/16/2018:   C1 weekly taxol 80 mg/m2 D1,8,15, Carboplatin AUC 6 D1 q 21 days. PCEs on thurs, txt on fridays. EXAMS ON EVEN CYCLES     CA 125: 523.4  Genetic testing: Neg for CS mutation, no VUS, lifetime remaining breast cancer risk -3.4%    05/07/2018:   C2 weekly taxol 80 mg/m2 D1,8,15, Carboplatin AUC 6 D1 q 21 days. PCEs on thurs, txt on fridays. EXAMS ON EVEN CYCLES     CA 125: 162.2    05/20/2018:   Early PCE due to scheduling, due 6/21    05/28/2018  Delay C3 due to low ANC     CA 125: 25    06/04/2018  C3 weekly taxol 80 mg/m2 D1,8,15, Carboplatin AUC 6 D1 q 21 days. EXAMS ON EVEN CYCLES.      CA 125    06/25/2018:   HOLD chemo due to low anc (1047)- defer to next week, and change to q 28 days cycle to build in an off week to allow for counts to recover     CA 125: 10.6    07/02/2018:   C4 weekly taxol 80 mg/m2 D1,8,15, Carboplatin AUC 6 D1 q 28 days. EXAMS ON EVEN CYCLES.  Review her genetics testing results      CA 125 7.2    07/30/2018:  C5 weekly taxol 80 mg/m2 D1,8,15, Carboplatin AUC 6 D1 q 28 days. Neulasta Day 16      EXAMS ON EVEN CYCLES.      CA 125: 11.4    08/27/2018:   C6 weekly taxol 80 mg/m2 D1,8,15, Carboplatin AUC 6 D1 q 28 days. Neulasta Day 16. Order Ct CAP. RTC 10/11 (3 weeks)     EXAMS ON EVEN CYCLES. Discussed option of maintenance parp I- pt not decided (doesn't want  to feel tired anymore)     CA 125: 8.7       05/28/2018 Tumor Markers    Tumor markers tested:  CA 125.  Results:   25.     06/25/2018 Tumor Markers    Tumor markers tested:  CA 125.  Results:   10.6.     07/02/2018 Tumor Markers    Tumor markers tested:  CA 125.  Results:   7.2.     07/07/2018 Hospital Admission    Admit date: 7/31 - 07/13/18  Admission diagnosis: Partial SBO  Additional comments: On 7/31??she experienced abdominal pain at home that was sharp on her LLQ and had significant diarrhea and one episode of emesis. She came to be evaluated at the [facility] On[facility] clinic. She has a history of prior small bowel obstruction. She was give IL NS, 2mg  morphine IV, and 4mg  Zofran IV in clinic and admitted for further observation. The abdominal XR showed dilated bowel loops, concerning for obstruction. CT abdomen/pelvis showed dilation of the small bowel with transition point seen in the anterior mid abdomen near the midline.     07/07/2018 Imaging    Imaging Completed:  X-ray of  abdomen  Result: Nonspecific bowel gas pattern. The presence of multiple air-fluid levels in small bowel raises question of early or partial small bowel obstruction.     07/07/2018 Imaging    CTAP:Small bowel obstruction with transition point in the anterior mid abdomen near the midline. Small amount of free fluid in the abdomen, which is nonspecific. 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  Peritoneum: Small amount of free fluid in the abdomen. A para-aortic density with central low attenuation measures 2.5 x 2 cm and is significantly decreased in size from prior.       07/30/2018 Tumor Markers    Tumor  markers tested:  CA 125.  Results:   11.4.     08/27/2018 Tumor Markers    Tumor markers tested:  CA 125.  Results:   8.7.     09/20/2018 Procedure    Port removed: While the skin surface appeared inflamed, there was no evidence of infection in the port reservoir.       09/22/2018  Imaging    CT Chest-No evidence of thoracic metastatic disease.  Suspected small seroma in the base of the right neck. ??Necrotic metastatic lesion could also have this appearance but considered less likely.    [name]- Interval decrease in size of a left periaortic necrotic lymph node. No evidence of new metastatic disease.     10/15/2018 Imaging    Imaging Completed:  PET scan of  whole body  Result: Resolution of the previous hypermetabolic para-aortic lymph nodes and hepatic dome lesions.  Hypermetabolic focus within the left thyroid lobe.  Consider further evaluation with ultrasound, if clinically indicated.       10/19/2018 Imaging    Imaging Completed:  Ultrasound of  neck  Result: 1.2 cm irregular hypoechoic nodule in the left thyroid lobe is suspicious. Recommend FNA for further evaluation.  1.0 cm nodule right thyroid lobe containing macrocalcification is indeterminate. Recommend follow-up thyroid ultrasound in one year to evaluate for stability.     10/21/2018 Tumor Markers    Tumor markers tested:  CA 125.  Results:   4.8.     11/12/2018 Tumor Markers    Tumor markers tested:  CA 125  Results:   5.5     11/12/2018 - 12/03/2018 Systemic Therapy (Oral and IV)    11/12/2018: C1 rubraca 600 mg po BID. RTC 2 weeks for labs (CBC, CMP, 4 weeks for PCE #2). Pt plans to go to [place] and may leave before PCE and re-est care at [facility] (Dr. Gerilyn Pilgrim). Referral to ENT placed (pt cancelled appt for 11/22) and FNA was prev ordered for thyroid nodule- but has not been done.        03/02/2019 Imaging    CT Scan from [facility]    No evidence for recurrent or new neoplasm. Increased colonic stool could indicate constipation. Postoperative changes     06/13/2019 Tumor Markers    Tumor markers tested: CA 125 Results:   5.0.     09/15/2019 Tumor Markers    CA 125-6.1     05/31/2020 Tumor Markers    Ca 125-5.9     08/16/2020 Tumor Markers    CA 125-6.2           Past Medical History  She  has a past medical history of Bowel  obstruction (CMS Dx) (2018) and Ovarian cancer (CMS Dx) (2014).  Past Medical History:   Diagnosis Date   ??? Bowel obstruction (CMS Dx) 2018   ??? Ovarian cancer (CMS Dx) 2014       Past Surgical History  Past Surgical History:   Procedure Laterality Date   ??? ABDOMINOPLASTY  1999   ??? BLEPHAROPLASTY  2000   ??? DILATION AND CURETTAGE OF UTERUS  1979   ??? ELBOW SURGERY  1990   ??? EYE SURGERY  1994   ??? KNEE ARTHROPLASTY Right    ??? ROTATOR CUFF REPAIR     ??? TRANSUMBILICAL AUGMENTATION MAMMAPLASTY     ??? VAGINAL HYSTERECTOMY  1979       Family History  History reviewed. No pertinent family history.    Family cancer history for  ovarian, uterine and colon cancer is negative other than above.       Social History  Social History     Socioeconomic History   ??? Marital status: Divorced     Spouse name: None   ??? Number of children: None   ??? Years of education: None   ??? Highest education level: None   Occupational History   ??? None   Tobacco Use   ??? Smoking status: Current Some Day Smoker     Packs/day: 0.00   ??? Smokeless tobacco: Never Used   Vaping Use   ??? Vaping Use: Never used   Substance and Sexual Activity   ??? Alcohol use: Yes   ??? Drug use: No   ??? Sexual activity: Not Currently   Other Topics Concern   ??? Caffeine Use Yes   ??? Occupational Exposure No   ??? Exercise Yes   ??? Seat Belt Yes   Social History Narrative    Mammogram-6 years ago and it was normal. Pt stated she isn't getting them anymore.      Colonoscopy-10 years ago        Pt reports she can lie flat in bed without SOB.    Pt states she can walk a flight of stairs and city block without chest pain and SOB>       Social Determinants of Health     Financial Resource Strain: Not on file   Physical Activity: Not on file   Stress: Not on file   Social Connections: Not on file   Housing Stability: Not on file       Past OB/GYN History  G3P2  She reports menarche at age 32 and menopause at age 29-surgical.  She denies a history of STIs.  She denies a history of abnormal cervical  cytology and reports her last cytologic examination she is unsure of. She has taken HRT.     Health maintenance:  Mammogram: Date 6 years ago Results normal  Colonoscopy: Date 10 years ago Results normal    Allergies  She is allergic to bactrim [sulfamethoxazole-trimethoprim], nsaids (non-steroidal anti-inflammatory drug), celecoxib, and metoclopramide.    BMI  Body mass index is 21.39 kg/m??.    Disease Status: No evidence of disease/remission (05/31/2020  2:40 PM)              Histories  She has a past medical history of Bowel obstruction (CMS Dx) (2018) and Ovarian cancer (CMS Dx) (2014).    She has a past surgical history that includes Dilation and curettage of uterus (1979); Vaginal hysterectomy (1979); Elbow surgery (1990); Eye surgery (1994); Abdominoplasty (1999); Blepharoplasty (2000); Transumbilical augmentation mammaplasty; Rotator cuff repair; and Knee Arthroplasty (Right).    Her family history is not on file.    She reports that she has been smoking. She has been smoking about 0.00 packs per day. She has never used smokeless tobacco. She reports current alcohol use. She reports that she does not use drugs.    Allergies  Bactrim [sulfamethoxazole-trimethoprim], Nsaids (non-steroidal anti-inflammatory drug), Celecoxib, and Metoclopramide    Medications  Outpatient Encounter Medications as of 11/15/2020   Medication Sig Dispense Refill   ??? ascorbic acid, vitamin C, (VITAMIN C) 1000 MG tablet Take 1,000 mg by mouth daily.     ??? chlorhexidine (PERIDEX) 0.12 % solution      ??? cholecalciferol, vitamin D3, (VITAMIN D3) 1000 units tablet Take 1 tablet (1,000 Units total) by mouth daily. 30 tablet 0   ???  GLUTATHIONE-L ORAL Take by mouth daily.     ??? iodine-sodium iodide 2 % solution Apply topically if needed.     ??? Lactobac no.41/Bifidobact no.7 (PROBIOTIC-10 ORAL) Take 1 capsule by mouth daily. [employer] brand     ??? UNABLE TO FIND multimineral     ??? vit b complex w-b 12 (B COMPLEX-VITAMIN B12) tablet Take 1  tablet by mouth daily.     ??? vitamin E 100 UNIT capsule Take 100 Units by mouth daily.       No facility-administered encounter medications on file as of 11/15/2020.        Review of Systemssee HPI    Vitals  Blood pressure 136/84, pulse 70, temperature 98.1 ??F (36.7 ??C), temperature source Temporal, resp. rate 13, height 5' 4 (1.626 m), weight 124 lb 9.6 oz (56.5 kg), SpO2 99 %.    Physical Exam   Vitals reviewed.  Constitutional: She is oriented to person, place, and time. She appears well-developed.   HENT:   Head: Normocephalic and atraumatic.   Eyes: Conjunctivae are normal.   Neck: No thyromegaly present.   Cardiovascular: Normal rate, regular rhythm and normal heart sounds.   Pulmonary/Chest: Effort normal and breath sounds normal. No respiratory distress. She has no wheezes. She has no rales.   Abdominal: Soft. Bowel sounds are normal. She exhibits no distension and no mass. There is no abdominal tenderness. There is no rebound and no guarding.   No masses or tenderness  No hernias  No hepatosplenomegaly     Genitourinary:    Genitourinary Comments: Normal external genitalia/vulva.  Normal urethral meatus, urethra, bladder, anus and perineum.  Normal vagina and vaginal cuff.  Uterus, cervix and adnexa surgically absent.  Normal rectum.  There is no nodularity in the posterior culdesac or rectovaginal septum.         Musculoskeletal:         General: Normal range of motion.      Cervical back: Normal range of motion and neck supple.   Lymphadenopathy:     She has no cervical adenopathy.   Neurological: She is alert and oriented to person, place, and time.   Skin: Skin is warm and dry.   Psychiatric: Her behavior is normal. Judgment and thought content normal.          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     Posterior cul de sac biopsy-metastatic adenocarcinoma.     Omentectomy- metastatic adenocarcinoma     LND-negative     Residual right IP  ligament, Anterior cul de sac biopsy, right pelvic side will biopsy, left pelvic side wall, right gutter and left gutter-->benign    04/06/2017:   CT [name]- mild small bowel obstruction with transition point in the left pelvis adjacent to the anterior abdominal wall. Low density in the common femoral veins bilaterally thought likely to represent flow artificat, may consider follow up with lomer extremity Doppler to ensure there is no underlying deep venous thrombosis.    01/29/2018:   CTAP:  Interval development of necrotic retroperitoneal lymphadenopathy with largest node measuring 4.9  4.2 x 4.8 cm. Origin is indeterminate.  Options would include CT directed biopsy as well as PET/CT.    02/18/2018:   CA 125:  197.8    02/26/2018:   CT guided Left retroperitoneal node biopsy- focally involved by non-small cell malignancy- additional stains are diagnostic of non-small cell carcionoma and supportive of involve53ment by the patients reported previous known ovarian malignancy.    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  No other focal areas of FD avid malignancy are found. Specifically, no  lymph node activity is found outside of the retroperitoneum and there is no evidence for FDG avid pulmonary, skeletal or hepatic parenchymal metastasis.    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 No retroperitoneal, intra-abdominal, pelvic or inguinal adenopathy, no evidence for peritoneal surface neoplastic involvement. 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 She completed 6 cycles of chemotherapy with Carboplatin and Paclitaxel-weekly (C6D1 was 08/27/18). CA 125 was 8.7 at C6.     She underwent CT CAP on 09/22/2018 revealed ned in chest and interval decrease in size of a left periaortic necrotic lymph node from 1.8 to 1.6 cm. 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     PET on 10/15/18 shows reassuring intraabdominal findings with resolution of the previous hypermetabolic para-aortic lymph nodes and hepatic dome lesions, but did note uptake within the thyroid. She underwent thyroid US on 10/20/18 with a 1.2 cm irregular hypoechoic nodule in the left thyroid lobe is suspicious. Recommend FNA for further evaluation.     She initiated maintenance  rubraca 11/2018 but ultimately elected to stop in 02/2019 due to side effects.   Patient had reestarted drug at 500 mg po BID once she got down to [place]-. Was in [place] from 12/2018-05/2019. Reports her oncologist down in [place] had changed doses of Rubraca a few times, to 300mg  po BID but decided to stop drug  all together back in 02/2019 2/2 to decreased QOL. She does not wish to restart drug at this time. Last CT scan 03/02/2019 in [place], no evidence of recurrent or new neoplasm.     She is seen today for surveillance visit and is doing well with NED on exam and 5.9 CA 125. We will continue surveillance at this time and return to clinic in 3 months (~February prior to going to [place]).       Thyroid Nodule:   Incidental finding on PET scan of hypermetabolic focus within left thyroid nodule. Ultrasound performed on 10/20/18 with 1.2 cm irregular hypoechoic nodule, appearing suspicious. Recommend FNA for further evaluation, which was ordered. Pt has not followed up to date.  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 Referral to ENT made, but patient did not see them. She followed up with her PCP and was started on Synthroid. Last TSH 5.91. Repeat TSH/T4/T3 labs today.     Genetics:  previously discussed her negative genetic testing results.    Pain    Pain Score: Zero (11/15/2020 11:03 AM)    She denies presence of pain.  Pain will be reassessed at next visit.      Dispo: return to clinic 3 months       Salley Hews, CNP  Division of Gynecologic Oncology  [phone]       I saw and personally examined the patient today with my CNP Salley Hews. I discussed the findings and therapeutic plan with the patient. I repeated, reviewed and agree with the history of present illness, past medical histories, family history, social history, medication list, and allergies as listed. The review of systems is as noted above. My physical exam confirms the findings listed above. Review of labs, pathology  reports, radiograph reports, and medical records confirm the findings noted above. I agree with the assessment and plan as noted above. I have edited the note where appropriate.     I have personally performed a face to face diagnostic evaluation on this patient, seen initially by the CNP. I have personally developed the care plan as outlined in the Assessment and Plan.      Complexity: mod     I spent a total of 35 minutes was spent prior to, during and after the patient encounter in counseling and/or coordination of care, documentation and counseling with patient and/or family.       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  11/15/2020:   CA 125: 5.9      Maggie Font, MD, FACOG  Associate Professor, Obstetrics and Gynecology  Division of Gynecologic Oncology        Medical Decision Making:  The following items were considered in medical decision making:  Review / order clinical lab tests  Review / order radiology tests  Review / order other diagnostic tests/interventions

## 2021-01-17 ENCOUNTER — Ambulatory Visit: Payer: Medicare (Managed Care)

## 2021-01-17 NOTE — Unmapped (Signed)
Pt calling states needs last labs to her OBGYN in [place] states you have fax number everything was sent over but he last labs

## 2021-01-17 NOTE — Unmapped (Signed)
Faxed CA 125 results to  Dr. Bayard Males at the patient's request.

## 2021-02-14 ENCOUNTER — Ambulatory Visit: Payer: Medicare (Managed Care)

## 2021-05-30 ENCOUNTER — Ambulatory Visit: Payer: Medicare (Managed Care)

## 2021-06-20 ENCOUNTER — Ambulatory Visit: Payer: Medicare (Managed Care)

## 2021-07-04 ENCOUNTER — Ambulatory Visit: Admit: 2021-07-04 | Payer: Medicare (Managed Care)

## 2021-07-04 DIAGNOSIS — C569 Malignant neoplasm of unspecified ovary: Secondary | ICD-10-CM

## 2021-07-04 LAB — CA 125: CA 125: 6 U/mL (ref 5.5–35.0)

## 2021-07-04 NOTE — Unmapped (Signed)
Faxed updated office note and C A 125 to Dr. Bayard Males

## 2021-07-04 NOTE — Unmapped (Signed)
History of Present Illness  Kelsey Sullivan is a 80 y.o. female with   Chief Complaint   Patient presents with   ??? 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 She declined adjuvant therapy. In 01/2018, she experienced mid abdominal pain, and seen by PCP and ED, with CT of abdomen and pelvis showing interval development of necrotic retroperitoneal lymphadenopathy with largest node measuring 4.9  4.2 x 4.8 cm. Her CA 125 on 02/18/18 was 197.8.  She underwent a ct-guided nodal biopsy on 02/26/18, with pathology confirming recurrent disease.  She completed 6 cycles of chemotherapy with Carboplatin and Paclitaxel-weekly (C6D1 was 08/27/18). CA 125 was 8.7 at C6.     She underwent CT CAP on 09/22/2018 revealed ned in chest and interval decrease in size of a left periaortic necrotic lymph node from 1.8 to 1.6 cm. 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 PET on 10/15/18 shows reassuring intraabdominal findings with resolution of the previous hypermetabolic para-aortic lymph nodes and hepatic dome lesions, but did note uptake within the thyroid. She underwent thyroid US on 10/20/18 with a 1.2 cm irregular hypoechoic nodule in the left thyroid lobe is suspicious. Recommended FNA for further evaluation, but she did not get this done.    She initiated maintenance rubraca 11/2018 but ultimately elected to stop in 02/2019 due to side effects.     03/02/2019:   CTAP: ([facility]- [place]): NED.  Partially calcified right thyroid nodule and hypodense 4 mm thyroid nodule    06/16/2019: Pt reports stopping Rubraca back in 02/2019 while down in [place] 2/2 to decreased QOL.     Pt here for surveillance      Kelsey Sullivan is doing well, but reports she feels overwhelmed and stressed recently, due to getting [place] home ready to sell.   She reports physically she is feeling well and has no concerns.   She denies any bloating or fullness  She reports normal bowel/bladder habits.   She denies any GU concerns. Denies any dysuria, frequency, hematuria, flank pain or fevers. Occasionally has some difficulty emptying.   She denies any vaginal bleeding or discharge.   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   She reports no leg swelling.   Her appetite is good, without nausea or vomiting.    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, ovary, omentum, cul de sac peritoneum 12/2012, plat sensitive recurrence 03/2018    Disposition: RTC 6 months with CA 125, genetic testing neg, foundation one testing results from [facility] in [place]- this was not performed   Current disease status: Neg for CS mutation, no VUS, lifetime remaining breast cancer risk -3.4%  Genetics: negative for mutations, breast cancer lifetime risk 3.4%  Survivorship plan: pending completion of treatment        Malignant neoplasm of ovary (CMS Dx)  12/28/2012 Pathology    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    Omentum-biopsy for frozen section-metastatic adenocarcionma.  Residual right IP ligament-benign smooth muscle and fibroadiopse tissue containing thick walled blood vessels.    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  Anterior cul de sac biopsy-benign fibroadipose tissue.   Posterior cul de sac biopsy-metastatic  adenocarcinoma.  Right pelvic side will biopsy- benign fibroadipose tissue.  Left pelvic side wall, right gutter and Left gutter-benign fibroadipose tissue.  Omentectomy- metastatic adenocarcinoma  LND-negative     12/28/2012 Surgery    BSO, omentectomy, LND     12/28/2012 Initial Diagnosis    Malignant neoplasm of both ovaries (CMS Dx)     01/08/2013 - 01/08/2013 Systemic Therapy (Oral and IV)    Patient refused chemotherapy and did alternative treatment including a raw diet for 1 year.       04/06/2017 Imaging    CT [name]- mild small bowel obstruction with transition point in the left pelvis adjacent to the anterior abdominal wall. Low density in the common femoral veins bilaterally thought likely to represent flow artificat, may consider follow up with lomer extremity Doppler to ensure there is no underlying deep venous thrombosis.     01/29/2018 Imaging    CT [name]-  Interval development of necrotic retroperitoneal lymphadenopathy with largest node measuring 4.9  4.2 x 4.8 cm. Origin is indeterminate.  Options would include CT directed biopsy as well as PET/CT.     02/18/2018 Tumor Markers    CA 125:  197.8     02/26/2018 Biopsy    CT guided Left retroperitoneal node biopsy- focally involved by non-small cell malignancy- additional stains are diagnostic of non-small cell carcionoma and supportive of involve61ment by the patients reported previous known ovarian malignancy.     03/04/2018 Imaging    PET/CT-  FDG avid retroperitoneal lymphadenopathy as described above and an FDG avid lesion over the dome of the liver most likely a peritoneal implant rather than a liver metastasis.  No other focal areas of FD avid malignancy are found. Specifically, no lymph node activity is found outside of the retroperitoneum and there is no evidence for FDG avid pulmonary, skeletal or hepatic parenchymal metastasis.     04/12/2018 Procedure    IR port placed       04/15/2018 Genetic Testing    Myraid Testing-Neg     04/16/2018 - 08/27/2018 Systemic  Therapy (Oral and IV)    Treatment Summary   Treatment goal Disease control   Plan Name OP Gyn CARBOplatin AUC 6 / PACLitaxel Weekly   Status Active   Start Date 04/16/2018   End Date 09/10/2018 (Planned)   Provider Mikle Bosworth, MD   Chemotherapy PACLitaxel (TAXOL) 120 mg in sodium chloride 0.9 % 250 mL chemo infusion, 80 mg/m2 = 120 mg, Intravenous, Once, 4 of 6 cycles    CARBOplatin (PARAPLATIN) 480 mg in dextrose 5% in water (D5W) 250 mL chemo infusion, 480 mg, Intravenous, Once, 4 of 6 cycles  Dose modification: 497 mg (original dose 484.8 mg, Cycle 2, Reason: Other (See Comments))         04/16/2018:   C1 weekly taxol 80 mg/m2 D1,8,15, Carboplatin AUC 6 D1 q 21 days. PCEs on thurs, txt on fridays. EXAMS ON EVEN CYCLES     CA 125: 523.4     Genetic testing:  Neg for CS mutation, no VUS, lifetime remaining breast cancer risk -3.4%    05/07/2018:   C2 weekly taxol 80 mg/m2 D1,8,15, Carboplatin AUC 6 D1 q 21 days. PCEs on thurs, txt on fridays. EXAMS ON EVEN CYCLES     CA 125: 162.2    05/20/2018:   Early PCE due to scheduling, due 6/21    05/28/2018  Delay C3 due to low ANC     CA 125: 25    06/04/2018  C3 weekly taxol 80 mg/m2 D1,8,15, Carboplatin AUC 6 D1 q 21 days. EXAMS ON EVEN CYCLES.      CA 125    06/25/2018:   HOLD chemo due to low anc (1047)- defer to next week, and change to q 28 days cycle to build in an off week to allow for counts to recover     CA 125: 10.6    07/02/2018:   C4 weekly taxol 80 mg/m2 D1,8,15, Carboplatin AUC 6 D1 q 28 days. EXAMS ON EVEN CYCLES.  Review her genetics testing results      CA 125 7.2    07/30/2018:  C5 weekly taxol 80 mg/m2 D1,8,15, Carboplatin AUC 6 D1 q 28 days. Neulasta Day 16      EXAMS ON EVEN CYCLES.      CA 125: 11.4    08/27/2018:   C6 weekly taxol 80 mg/m2 D1,8,15, Carboplatin AUC 6 D1 q 28 days. Neulasta Day 16. Order Ct CAP. RTC 10/11 (3 weeks)     EXAMS ON EVEN CYCLES. Discussed option of maintenance parp I- pt not decided (doesn't want to feel tired  anymore)     CA 125: 8.7       05/28/2018 Tumor Markers    Tumor markers tested:  CA 125.  Results:   25.     06/25/2018 Tumor Markers    Tumor markers tested:  CA 125.  Results:   10.6.     07/02/2018 Tumor Markers    Tumor markers tested:  CA 125.  Results:   7.2.     07/07/2018 Hospital Admission    Admit date: 7/31 - 07/13/18  Admission diagnosis: Partial SBO  Additional comments: On 7/31??she experienced abdominal pain at home that was sharp on her LLQ and had significant diarrhea and one episode of emesis. She came to be evaluated at the [facility]. She has a history of prior small bowel obstruction. She was give IL NS, 2mg  morphine IV, and 4mg  Zofran IV in clinic and admitted for further observation. The abdominal XR showed dilated bowel loops, concerning for obstruction. CT abdomen/pelvis showed dilation of the small bowel with transition point seen in the anterior mid abdomen near the midline.     07/07/2018 Imaging    Imaging Completed:  X-ray of  abdomen  Result: Nonspecific bowel gas pattern. The presence of multiple air-fluid levels in small bowel raises question of early or partial small bowel obstruction.     07/07/2018 Imaging    CTAP:Small bowel obstruction with transition point in the anterior mid abdomen near the midline. Small amount of free fluid in the abdomen, which is nonspecific. 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  Peritoneum: Small amount of free fluid in the abdomen. A para-aortic density with central low attenuation measures 2.5 x 2 cm and is significantly decreased in size from prior.       07/30/2018 Tumor Markers    Tumor markers tested:  CA 125.  Results:   11.4.     08/27/2018 Tumor Markers    Tumor markers tested:  CA 125.  Results:   8.7.     09/20/2018 Procedure    Port removed: While the skin surface appeared inflamed, there was no evidence of infection in the port reservoir.       09/22/2018 Imaging    CT  Chest-No evidence of thoracic metastatic disease.  Suspected small seroma in the base of the right neck. ??Necrotic metastatic lesion could also have this appearance but considered less likely.    [name]- Interval decrease in size of a left periaortic necrotic lymph node. No evidence of new metastatic disease.     10/15/2018 Imaging    Imaging Completed:  PET scan of  whole body  Result: Resolution of the previous hypermetabolic para-aortic lymph nodes and hepatic dome lesions.  Hypermetabolic focus within the left thyroid lobe.  Consider further evaluation with ultrasound, if clinically indicated.       10/19/2018 Imaging    Imaging Completed:  Ultrasound of  neck  Result: 1.2 cm irregular hypoechoic nodule in the left thyroid lobe is suspicious. Recommend FNA for further evaluation.  1.0 cm nodule right thyroid lobe containing macrocalcification is indeterminate. Recommend follow-up thyroid ultrasound in one year to evaluate for stability.     10/21/2018 Tumor Markers    Tumor markers tested:  CA 125.  Results:   4.8.     11/12/2018 Tumor Markers    Tumor markers tested:  CA 125  Results:   5.5     11/12/2018 - 12/03/2018 Systemic Therapy (Oral and IV)    11/12/2018: C1 rubraca 600 mg po BID. RTC 2 weeks for labs (CBC, CMP, 4 weeks for PCE #2). Pt plans to go to [place] and may leave before PCE and re-est care at [facility] (Dr. Gerilyn Pilgrim). Referral to ENT placed (pt cancelled appt for 11/22) and FNA was prev ordered for thyroid nodule- but has not been done.        03/02/2019 Imaging    CT Scan from [facility]    No evidence for recurrent or new neoplasm. Increased colonic stool could indicate constipation. Postoperative changes     06/13/2019 Tumor Markers    Tumor markers tested: CA 125 Results:   5.0.     09/15/2019 Tumor Markers    CA 125-6.1     05/31/2020 Tumor Markers    Ca 125-5.9     08/16/2020 Tumor Markers    CA 125-6.2     02/06/2021 Tumor Markers    CA 125-4.6           Past Medical History  She  has a past  medical history of Bowel obstruction (CMS Dx) (2018) and Ovarian cancer (CMS Dx) (2014).  Past Medical History:   Diagnosis Date   ??? Bowel obstruction (CMS Dx) 2018   ??? Ovarian cancer (CMS Dx) 2014       Past Surgical History  Past Surgical History:   Procedure Laterality Date   ??? ABDOMINOPLASTY  1999   ??? BLEPHAROPLASTY  2000   ??? DILATION AND CURETTAGE OF UTERUS  1979   ??? ELBOW SURGERY  1990   ??? EYE SURGERY  1994   ??? KNEE ARTHROPLASTY Right    ??? ROTATOR CUFF REPAIR     ??? TRANSUMBILICAL AUGMENTATION MAMMAPLASTY     ??? VAGINAL HYSTERECTOMY  1979       Family History  History reviewed. No pertinent  family history.    Family cancer history for ovarian, uterine and colon cancer is negative other than above.       Social History  Social History     Socioeconomic History   ??? Marital status: Divorced     Spouse name: None   ??? Number of children: None   ??? Years of education: None   ??? Highest education level: None   Occupational History   ??? None   Tobacco Use   ??? Smoking status: Current Some Day Smoker     Packs/day: 0.00   ??? Smokeless tobacco: Never Used   Vaping Use   ??? Vaping Use: Never used   Substance and Sexual Activity   ??? Alcohol use: Yes   ??? Drug use: No   ??? Sexual activity: Not Currently   Other Topics Concern   ??? Caffeine Use Yes   ??? Occupational Exposure No   ??? Exercise Yes   ??? Seat Belt Yes   Social History Narrative    Mammogram-6 years ago and it was normal. Pt stated she isn't getting them anymore.      Colonoscopy-10 years ago        Pt reports she can lie flat in bed without SOB.    Pt states she can walk a flight of stairs and city block without chest pain and SOB>       Social Determinants of Health     Financial Resource Strain: Not on file   Physical Activity: Not on file   Stress: Not on file   Social Connections: Not on file   Housing Stability: Not on file       Past OB/GYN History  G3P2  She reports menarche at age 33 and menopause at age 10-surgical.  She denies a history of STIs.  She denies a  history of abnormal cervical cytology and reports her last cytologic examination she is unsure of. She has taken HRT.     Health maintenance:  Mammogram: Date 6 years ago Results normal  Colonoscopy: Date 10 years ago Results normal    Allergies  She is allergic to nsaids (non-steroidal anti-inflammatory drug), sulfamethoxazole-trimethoprim, celecoxib, and metoclopramide.    BMI  Body mass index is 21.11 kg/m??.                   Histories  She has a past medical history of Bowel obstruction (CMS Dx) (2018) and Ovarian cancer (CMS Dx) (2014).    She has a past surgical history that includes Dilation and curettage of uterus (1979); Vaginal hysterectomy (1979); Elbow surgery (1990); Eye surgery (1994); Abdominoplasty (1999); Blepharoplasty (2000); Transumbilical augmentation mammaplasty; Rotator cuff repair; and Knee Arthroplasty (Right).    Her family history is not on file.    She reports that she has been smoking. She has been smoking about 0.00 packs per day. She has never used smokeless tobacco. She reports current alcohol use. She reports that she does not use drugs.    Allergies  Nsaids (non-steroidal anti-inflammatory drug), Sulfamethoxazole-trimethoprim, Celecoxib, and Metoclopramide    Medications  Outpatient Encounter Medications as of 07/04/2021   Medication Sig Dispense Refill   ??? ascorbic acid, vitamin C, (VITAMIN C) 1000 MG tablet Take 1,000 mg by mouth daily.     ??? chlorhexidine (PERIDEX) 0.12 % solution      ??? cholecalciferol, vitamin D3, (VITAMIN D3) 1000 units tablet Take 1 tablet (1,000 Units total) by mouth daily. 30 tablet 0   ??? GLUTATHIONE-L  ORAL Take by mouth daily.     ??? Lactobac no.41/Bifidobact no.7 (PROBIOTIC-10 ORAL) Take 1 capsule by mouth daily. [employer] brand     ??? levothyroxine (SYNTHROID) 75 MCG tablet Take 75 mcg by mouth every morning before breakfast.     ??? UNABLE TO FIND multimineral     ??? vit b complex w-b 12 tablet Take 1 tablet by mouth daily.     ??? iodine-sodium iodide 2 %  solution Apply topically if needed.     ??? vitamin E 100 UNIT capsule Take 100 Units by mouth daily.       No facility-administered encounter medications on file as of 07/04/2021.        Review of Systemssee HPI    Vitals  Blood pressure 126/79, pulse 68, temperature 97.3 ??F (36.3 ??C), temperature source Temporal, resp. rate 20, height 5' 4 (1.626 m), weight 123 lb (55.8 kg), SpO2 100 %.    Physical Exam   Vitals reviewed.  Constitutional: She is oriented to person, place, and time. She appears well-developed.   HENT:   Head: Normocephalic and atraumatic.   Eyes: Conjunctivae are normal.   Neck: No thyromegaly present.   Cardiovascular: Normal rate, regular rhythm and normal heart sounds.   Pulmonary/Chest: Effort normal and breath sounds normal. No respiratory distress. She has no wheezes. She has no rales.   Abdominal: Soft. Bowel sounds are normal. She exhibits no distension and no mass. There is no abdominal tenderness. There is no rebound and no guarding.   No masses or tenderness  No hernias  No hepatosplenomegaly     Genitourinary:    Genitourinary Comments: Normal external genitalia/vulva.  Normal urethral meatus, urethra, bladder, anus and perineum.  Normal vagina and vaginal cuff.  Uterus, cervix and adnexa surgically absent.  Normal rectum.  There is no nodularity in the posterior culdesac or rectovaginal septum.         Musculoskeletal:         General: Normal range of motion.      Cervical back: Normal range of motion and neck supple.   Lymphadenopathy:     She has no cervical adenopathy.   Neurological: She is alert and oriented to person, place, and time.   Skin: Skin is warm and dry.   Psychiatric: Her behavior is normal. Judgment and thought content normal.          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     Posterior cul de sac biopsy-metastatic adenocarcinoma.     Omentectomy- metastatic  adenocarcinoma     LND-negative     Residual right IP ligament, Anterior cul de sac biopsy, right pelvic side will biopsy, left pelvic side wall, right gutter and left gutter-->benign    04/06/2017:   CT [name]- mild small bowel obstruction with transition point in the left pelvis adjacent to the anterior abdominal wall. Low density in the common femoral veins bilaterally thought likely to represent flow artificat, may consider follow up with lomer extremity Doppler to ensure there is no underlying deep venous thrombosis.    01/29/2018:   CTAP:  Interval development of necrotic retroperitoneal lymphadenopathy with largest node measuring 4.9  4.2 x 4.8 cm. Origin is indeterminate.  Options would include CT directed biopsy as well as PET/CT.    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  No other focal areas of FD avid malignancy are found. Specifically, no  lymph node activity is found outside of the retroperitoneum and there is no evidence for FDG avid pulmonary, skeletal or hepatic parenchymal metastasis.    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 No retroperitoneal, intra-abdominal, pelvic or inguinal adenopathy, no evidence for peritoneal surface neoplastic involvement. 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 She completed 6 cycles of chemotherapy with Carboplatin and Paclitaxel-weekly (C6D1 was 08/27/18). CA 125 was 8.7 at C6.     She underwent CT CAP on 09/22/2018 revealed ned in chest and interval decrease in size of a left periaortic necrotic lymph node from 1.8 to 1.6 cm. 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     PET on 10/15/18 shows reassuring intraabdominal findings with resolution of the previous hypermetabolic para-aortic lymph nodes and hepatic dome lesions, but did note uptake within the thyroid. She underwent thyroid US on 10/20/18 with a 1.2 cm irregular hypoechoic nodule in the left thyroid lobe is suspicious. Recommend FNA for further evaluation.     She initiated maintenance rubraca 11/2018 but ultimately elected to stop in 02/2019 due to side  effects.   Patient had reestarted drug at 500 mg po BID once she got down to [place]-. Was in [place] from 12/2018-05/2019. Reports her oncologist down in [place] had changed doses of Rubraca a few times, to 300mg  po BID  but decided to stop drug all together back in 02/2019 2/2 to decreased QOL. She does not wish to restart drug at this time. Last CT scan 03/02/2019 in [place], no evidence of recurrent or new neoplasm.     She is seen today for surveillance visit and is doing well with NED on exam and normal CA 125 of 6. We will continue surveillance at this time and return to clinic in 6 months.     She knows to be aware for any signs or symptoms of recurrence such as vaginal bleeding, pain or gastrointestinal or urinary changes, increasing pain, or any other bothersome symptoms, and will call if any of these occur.      Thyroid Nodule:   Incidental finding on PET scan of hypermetabolic focus within left thyroid nodule. Ultrasound performed on 10/20/18 with 1.2 cm irregular hypoechoic nodule, appearing suspicious. Recommend FNA for further evaluation, which was ordered. Pt has not followed up to date.  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 Referral to ENT made, but patient did not see them. She followed up with her PCP and was started on Synthroid. Last TSH 5.91. Repeat TSH/T4/T3 labs today.     Genetics:  previously discussed her negative genetic testing results.    Pain    Pain Score: 0 - No Pain (07/04/2021 10:58 AM)    She denies presence of pain.  Pain will be reassessed at next visit.        Dispo: return to clinic 6 months         Kelsey Hews, CNP  Division of Gynecologic Oncology  [phone]       I saw and personally examined the patient today with my CNP Kelsey Sullivan. I discussed the findings and therapeutic plan with the patient. I repeated, reviewed and agree with the history of present illness, past medical histories, family history, social history, medication list, and  allergies as listed. The review of systems is as noted above. My physical exam confirms the findings listed above. Review of labs, pathology reports, radiograph reports, and medical records confirm the findings noted above. I agree with the assessment and plan as noted above. I have edited the note where appropriate.     I have personally performed a face to face diagnostic evaluation on this patient, seen initially by the CNP. I have personally developed the care plan as outlined in the Assessment and Plan.      Complexity: mod     I spent a total of 35 minutes was spent prior to, during and after the patient encounter in counseling and/or coordination of care, documentation and counseling with patient and/or family.       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  06/24/2021:   CA 125: 6,. 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

## 2021-07-22 ENCOUNTER — Ambulatory Visit: Payer: Medicare (Managed Care)

## 2021-07-23 ENCOUNTER — Ambulatory Visit: Admit: 2021-07-23 | Payer: Medicare (Managed Care)

## 2021-07-23 DIAGNOSIS — L570 Actinic keratosis: Secondary | ICD-10-CM

## 2021-07-23 NOTE — Unmapped (Signed)
This patient presents for spot on right arm, wants off     I reviewed past records including:    Last [facility] D[facility]atology Visit Information:   none  Other Medical Information Reviewed For This Visit:   7/22 GynOnc  12/21 TSH  7/21 JointSurg  1/21 COVIDVax    Objective:  Complete skin examination was performed at patient request. 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 However, should they change or worsen, return to clinic for reevaluation.      Assessment/Plan:   Discussed the decision to perform the procedure and the medical necessity for precancerous spots. The patient may be at increased risk from this procedure because of age and location. Liquid nitrogen was applied to the 1 actinic keratoses. Post-operative care discussed with potential side effects.  As a courtesty light cryo to seborrheic keratosis arm  Seborrheic keratoses and multiple melanocytic nevi with no clinical atypia visible  - We discussed the benign nature of these spots, our understanding of them, and the patient was reassured. However, should they change or worsen, return to clinic for reevaluation.  We discussed OTC drug management with sunscreens, hats, clothing, how and why to use.        In addition to the time spent face-to-face with the patient, I estimate that I also spent an additional 3 minutes reviewing the chart, working within this patients chart in EPIC, and care coordination. Further additional time involved in obtaining medication prior authorization cannot be documented at this time.    FU prn months

## 2022-01-02 ENCOUNTER — Ambulatory Visit: Payer: Medicare (Managed Care)

## 2022-08-21 ENCOUNTER — Ambulatory Visit: Admit: 2022-08-21 | Payer: Medicare (Managed Care)

## 2022-08-21 ENCOUNTER — Ambulatory Visit: Admit: 2022-08-21 | Discharge: 2022-08-21 | Payer: Medicare (Managed Care)

## 2022-08-21 DIAGNOSIS — C569 Malignant neoplasm of unspecified ovary: Secondary | ICD-10-CM

## 2022-08-21 LAB — CA 125: CA 125: 8.5 U/mL (ref 5.5–35.0)

## 2022-08-21 NOTE — Unmapped (Signed)
History of Present Illness  Kelsey Sullivan is a 81 y.o. 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 She declined adjuvant therapy. In 01/2018, she experienced mid abdominal pain, and seen by PCP and ED, with CT of abdomen and pelvis showing interval development of necrotic retroperitoneal lymphadenopathy with largest node measuring 4.9  4.2 x 4.8 cm. Her CA 125 on 02/18/18 was 197.8.  She underwent a ct-guided nodal biopsy on 02/26/18, with pathology confirming recurrent disease.  She completed 6 cycles of chemotherapy with Carboplatin and Paclitaxel-weekly (C6D1 was 08/27/18). CA 125 was 8.7 at C6.     She underwent CT CAP on 09/22/2018 revealed ned in chest and interval decrease in size of a left periaortic necrotic lymph node from 1.8 to 1.6 cm. 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 PET on 10/15/18 shows reassuring intraabdominal findings with resolution of the previous hypermetabolic para-aortic lymph nodes and hepatic dome lesions, but did note uptake within the thyroid. She underwent thyroid US on 10/20/18 with a 1.2 cm irregular hypoechoic nodule in the left thyroid lobe is suspicious. Recommended FNA for further evaluation, but she did not get this done.    She initiated maintenance rubraca 11/2018 but ultimately elected to stop in 02/2019 due to side effects.     03/02/2019:   CTAP: ([facility]- [place]): NED. Partially calcified right thyroid  nodule and hypodense 4 mm thyroid nodule    06/16/2019: Pt reports stopping Rubraca back in 02/2019 while down in [place] 2/2 to decreased QOL.     There are no changes to the cancer history HPI above.       Pt here for surveillance      Kelsey Sullivan is doing well.   She denies any abdominal/pelvic pain.   She denies any bloating or fullness. She had some cramping/GI issues- but has resolved w omeprazole.   She reports normal bowel/bladder habits.  She denies any GU concerns. Denies any dysuria, frequency, hematuria, flank pain or fevers.  She denies any vaginal bleeding or discharge.   She denies neuropathy.   She reports no leg swelling.   Her appetite is good, and reports no nausea or vomiting.     There are no changes to the cancer history HPI above.         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, ovary, omentum, cul de sac peritoneum 12/2012, plat sensitive recurrence 03/2018    Disposition: RTC 6 months with CA 125, genetic testing neg, foundation one testing results from [facility] in [place]- this was not performed   Current disease status: Neg for CS mutation, no VUS, lifetime remaining breast cancer risk -3.4%  Genetics: negative for mutations, breast cancer lifetime risk 3.4%  Survivorship plan: pending completion of treatment        Malignant neoplasm of  ovary (CMS-HCC)   12/28/2012 Pathology    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    Omentum-biopsy for frozen section-metastatic adenocarcionma.  Residual right IP ligament-benign smooth muscle and fibroadiopse tissue containing thick walled blood vessels.    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  Anterior cul de sac biopsy-benign fibroadipose tissue.   Posterior cul de sac biopsy-metastatic adenocarcinoma.  Right pelvic side will biopsy-  benign fibroadipose tissue.  Left pelvic side wall, right gutter and Left gutter-benign fibroadipose tissue.  Omentectomy- metastatic adenocarcinoma  LND-negative     12/28/2012 Surgery    BSO, omentectomy, LND     12/28/2012 Initial Diagnosis    Malignant neoplasm of both ovaries (CMS Dx)     01/08/2013 - 01/08/2013 Systemic Therapy (Oral and IV)    Patient refused chemotherapy and did alternative treatment including a raw diet for 1 year.       04/06/2017 Imaging    CT [name]- mild small bowel obstruction with transition point in the left pelvis adjacent to the anterior abdominal wall. Low density in the common femoral veins bilaterally thought likely to represent flow artificat, may consider follow up with lomer extremity Doppler to ensure there is no underlying deep venous thrombosis.     01/29/2018 Imaging    CT [name]-  Interval development of necrotic retroperitoneal lymphadenopathy with largest node measuring 4.9  4.2 x 4.8 cm. Origin is indeterminate.  Options would include CT directed biopsy as well as PET/CT.     02/18/2018 Tumor Markers    CA 125:  197.8     02/26/2018 Biopsy    CT guided Left retroperitoneal node biopsy- focally involved by non-small cell malignancy- additional stains are diagnostic of non-small cell carcionoma and supportive of involve8ment by the patients reported previous known ovarian malignancy.     03/04/2018 Imaging    PET/CT-  FDG avid retroperitoneal lymphadenopathy as described above and an FDG avid lesion over the dome of the liver most likely a peritoneal implant rather than a liver metastasis.  No other focal areas of FD avid malignancy are found. Specifically, no lymph node activity is found outside of the retroperitoneum and there is no evidence for FDG avid pulmonary, skeletal or hepatic parenchymal metastasis.     04/12/2018 Procedure    IR port placed       04/15/2018 Genetic Testing    Myraid Testing-Neg     04/16/2018 - 08/27/2018 Systemic Therapy (Oral and IV)    Treatment Summary    Treatment goal Disease control   Plan Name OP Gyn CARBOplatin AUC 6 / PACLitaxel Weekly   Status Active   Start Date 04/16/2018   End Date 09/10/2018 (Planned)   Provider Mikle Bosworth, MD   Chemotherapy PACLitaxel (TAXOL) 120 mg in sodium chloride 0.9 % 250 mL chemo infusion, 80 mg/m2 = 120 mg, Intravenous, Once, 4 of 6 cycles    CARBOplatin (PARAPLATIN) 480 mg in dextrose 5% in water (D5W) 250 mL chemo infusion, 480 mg, Intravenous, Once, 4 of 6 cycles  Dose modification: 497 mg (original dose 484.8 mg, Cycle 2, Reason: Other (See Comments))         04/16/2018:   C1 weekly taxol 80 mg/m2 D1,8,15, Carboplatin AUC 6 D1 q 21 days. PCEs on thurs, txt on fridays. EXAMS ON EVEN CYCLES     CA 125: 523.4  Genetic testing: Neg for CS mutation, no VUS, lifetime remaining breast cancer risk -3.4%    05/07/2018:   C2 weekly taxol 80 mg/m2 D1,8,15, Carboplatin AUC 6 D1 q 21 days. PCEs on thurs, txt on fridays. EXAMS ON EVEN CYCLES     CA 125: 162.2    05/20/2018:   Early PCE due to scheduling, due 6/21    05/28/2018  Delay C3 due to low ANC     CA 125: 25    06/04/2018  C3 weekly taxol 80 mg/m2 D1,8,15, Carboplatin AUC 6 D1 q 21 days. EXAMS ON EVEN CYCLES.      CA 125    06/25/2018:   HOLD chemo due to low anc (1047)- defer to next week, and change to q 28 days cycle to build in an off week to allow for counts to recover     CA 125: 10.6    07/02/2018:   C4 weekly taxol 80 mg/m2 D1,8,15, Carboplatin AUC 6 D1 q 28 days. EXAMS ON EVEN CYCLES.  Review her genetics testing results      CA 125 7.2    07/30/2018:  C5 weekly taxol 80 mg/m2 D1,8,15, Carboplatin AUC 6 D1 q 28 days. Neulasta Day 16      EXAMS ON EVEN CYCLES.      CA 125: 11.4    08/27/2018:   C6 weekly taxol 80 mg/m2 D1,8,15, Carboplatin AUC 6 D1 q 28 days. Neulasta Day 16. Order Ct CAP. RTC 10/11 (3 weeks)     EXAMS ON EVEN CYCLES. Discussed option of maintenance parp I- pt not decided (doesn't want to feel tired anymore)     CA 125: 8.7       05/28/2018 Tumor  Markers    Tumor markers tested:  CA 125.  Results:   25.     06/25/2018 Tumor Markers    Tumor markers tested:  CA 125.  Results:   10.6.     07/02/2018 Tumor Markers    Tumor markers tested:  CA 125.  Results:   7.2.     07/07/2018 Hospital Admission    Admit date: 7/31 - 07/13/18  Admission diagnosis: Partial SBO  Additional comments: On 7/31 she experienced abdominal pain at home that was sharp on her LLQ and had significant diarrhea and one episode of emesis. She came to be evaluated at the [facility]. She has a history of prior small bowel obstruction. She was give IL NS, 2mg  morphine IV, and 4mg  Zofran IV in clinic and admitted for further observation. The abdominal XR showed dilated bowel loops, concerning for obstruction. CT abdomen/pelvis showed dilation of the small bowel with transition point seen in the anterior mid abdomen near the midline.     07/07/2018 Imaging    Imaging Completed:  X-ray of  abdomen  Result: Nonspecific bowel gas pattern. The presence of multiple air-fluid levels in small bowel raises question of early or partial small bowel obstruction.     07/07/2018 Imaging    CTAP:Small bowel obstruction with transition point in the anterior mid abdomen near the midline. Small amount of free fluid in the abdomen, which is nonspecific. 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  Peritoneum: Small amount of free fluid in the abdomen. A para-aortic density with central low attenuation measures 2.5 x 2 cm and is significantly decreased in size from prior.       07/30/2018 Tumor Markers  Tumor markers tested:  CA 125.  Results:   11.4.     08/27/2018 Tumor Markers    Tumor markers tested:  CA 125.  Results:   8.7.     09/20/2018 Procedure    Port removed: While the skin surface appeared inflamed, there was no evidence of infection in the port reservoir.       09/22/2018 Imaging    CT Chest-No evidence of thoracic metastatic  disease.  Suspected small seroma in the base of the right neck.  Necrotic metastatic lesion could also have this appearance but considered less likely.    [name]- Interval decrease in size of a left periaortic necrotic lymph node. No evidence of new metastatic disease.     10/15/2018 Imaging    Imaging Completed:  PET scan of  whole body  Result: Resolution of the previous hypermetabolic para-aortic lymph nodes and hepatic dome lesions.  Hypermetabolic focus within the left thyroid lobe.  Consider further evaluation with ultrasound, if clinically indicated.       10/19/2018 Imaging    Imaging Completed:  Ultrasound of  neck  Result: 1.2 cm irregular hypoechoic nodule in the left thyroid lobe is suspicious. Recommend FNA for further evaluation.  1.0 cm nodule right thyroid lobe containing macrocalcification is indeterminate. Recommend follow-up thyroid ultrasound in one year to evaluate for stability.     10/21/2018 Tumor Markers    Tumor markers tested:  CA 125.  Results:   4.8.     11/12/2018 Tumor Markers    Tumor markers tested:  CA 125  Results:   5.5     11/12/2018 - 12/03/2018 Systemic Therapy (Oral and IV)    11/12/2018: C1 rubraca 600 mg po BID. RTC 2 weeks for labs (CBC, CMP, 4 weeks for PCE #2). Pt plans to go to [place] and may leave before PCE and re-est care at [facility] (Dr. Gerilyn Pilgrim). Referral to ENT placed (pt cancelled appt for 11/22) and FNA was prev ordered for thyroid nodule- but has not been done.        03/02/2019 Imaging    CT Scan from [facility]    No evidence for recurrent or new neoplasm. Increased colonic stool could indicate constipation. Postoperative changes     06/13/2019 Tumor Markers    Tumor markers tested: CA 125 Results:   5.0.     09/15/2019 Tumor Markers    CA 125-6.1     05/31/2020 Tumor Markers    Ca 125-5.9     08/16/2020 Tumor Markers    CA 125-6.2     02/06/2021 Tumor Markers    CA 125-4.6           Past Medical History  She  has a past medical history of Bowel obstruction  (CMS-HCC) (2018) and Ovarian cancer (CMS-HCC) (2014).  Past Medical History:   Diagnosis Date    Bowel obstruction (CMS-HCC) 2018    Ovarian cancer (CMS-HCC) 2014       Past Surgical History  Past Surgical History:   Procedure Laterality Date    ABDOMINOPLASTY  1999    BLEPHAROPLASTY  2000    DILATION AND CURETTAGE OF UTERUS  1979    ELBOW SURGERY  1990    EYE SURGERY  1994    KNEE ARTHROPLASTY Right     ROTATOR CUFF REPAIR      TRANSUMBILICAL AUGMENTATION MAMMAPLASTY      VAGINAL HYSTERECTOMY  1979       Family History  No family history  on file.    Family cancer history for ovarian, uterine and colon cancer is negative other than above.       Social History  Social History     Socioeconomic History    Marital status: Divorced     Spouse name: Not on file    Number of children: Not on file    Years of education: Not on file    Highest education level: Not on file   Occupational History    Not on file   Tobacco Use    Smoking status: Some Days     Packs/day: 0.00     Types: Cigarettes    Smokeless tobacco: Never   Vaping Use    Vaping Use: Never used   Substance and Sexual Activity    Alcohol use: Yes    Drug use: No    Sexual activity: Not Currently   Other Topics Concern    Caffeine Use Yes    Occupational Exposure No    Exercise Yes    Seat Belt Yes   Social History Narrative    Mammogram-6 years ago and it was normal. Pt stated she isn't getting them anymore.      Colonoscopy-10 years ago        Pt reports she can lie flat in bed without SOB.    Pt states she can walk a flight of stairs and city block without chest pain and SOB>       Social Determinants of Health     Financial Resource Strain: Not on file   Physical Activity: Not on file   Stress: Not on file   Social Connections: Not on file   Housing Stability: Not on file       Past OB/GYN History  G3P2  She reports menarche at age 57 and menopause at age 50-surgical.  She denies a history of STIs.  She denies a history of abnormal cervical cytology and  reports her last cytologic examination she is unsure of. She has taken HRT.     Health maintenance:  Mammogram: Date 6 years ago Results normal  Colonoscopy: Date 10 years ago Results normal    Allergies  She is allergic to nsaids (non-steroidal anti-inflammatory drug), sulfamethoxazole-trimethoprim, celecoxib, and metoclopramide.    BMI  Body mass index is 20.77 kg/m.                   Histories  She has a past medical history of Bowel obstruction (CMS-HCC) (2018) and Ovarian cancer (CMS-HCC) (2014).    She has a past surgical history that includes Dilation and curettage of uterus (1979); Vaginal hysterectomy (1979); Elbow surgery (1990); Eye surgery (1994); Abdominoplasty (1999); Blepharoplasty (2000); Transumbilical augmentation mammaplasty; Rotator cuff repair; and Knee Arthroplasty (Right).    Her family history is not on file.    She reports that she has been smoking. She has never used smokeless tobacco. She reports current alcohol use. She reports that she does not use drugs.    Allergies  Nsaids (non-steroidal anti-inflammatory drug), Sulfamethoxazole-trimethoprim, Celecoxib, and Metoclopramide    Medications  Outpatient Encounter Medications as of 08/21/2022   Medication Sig Dispense Refill    escitalopram oxalate (LEXAPRO) 10 MG tablet Take 1 tablet (10 mg total) by mouth every morning.      omeprazole (PRILOSEC) 20 MG capsule Take 1 capsule (20 mg total) by mouth daily.      ascorbic acid, vitamin C, (VITAMIN C) 1000 MG tablet Take 1,000 mg by  mouth daily.      chlorhexidine (PERIDEX) 0.12 % solution       cholecalciferol, vitamin D3, (VITAMIN D3) 1000 units tablet Take 1 tablet (1,000 Units total) by mouth daily. 30 tablet 0    GLUTATHIONE-L ORAL Take by mouth daily.      iodine-sodium iodide 2 % solution Apply topically if needed.      Lactobac no.41/Bifidobact no.7 (PROBIOTIC-10 ORAL) Take 1 capsule by mouth daily. [employer] brand      levothyroxine (SYNTHROID) 75 MCG tablet Take 75 mcg by mouth  every morning before breakfast.      omeprazole (PRILOSEC OTC) 20 MG tablet Take 1 tablet (20 mg total) by mouth every morning before breakfast. 20mg  twice a day      UNABLE TO FIND multimineral      vit b complex w-b 12 tablet Take 1 tablet by mouth daily.      vitamin E 100 UNIT capsule Take 100 Units by mouth daily.       No facility-administered encounter medications on file as of 08/21/2022.        Review of Systemssee HPI    Vitals  Blood pressure 128/76, pulse 65, temperature 97.5 F (36.4 C), temperature source Temporal, resp. rate 16, height 5' 4 (1.626 m), weight 121 lb (54.9 kg), SpO2 98 %.    Physical Exam   Vitals reviewed.  Constitutional: She is oriented to person, place, and time. She appears well-developed.   HENT:   Head: Atraumatic.   Mouth/Throat: Mucous membranes are dry.   Neck: No thyromegaly present.   Cardiovascular: Normal rate.   Pulmonary/Chest: Effort normal.   Abdominal: Soft. Normal appearance. She exhibits no distension and no mass. There is no abdominal tenderness. There is no rebound and no guarding.   No masses or tenderness.  No hernia palpable.   No hepatosplenomegaly.          Genitourinary:    Genitourinary Comments: Normal external genitalia/vulva.  Normal urethral meatus, urethra, bladder, anus and perineum.  Normal vagina and vaginal cuff.  Uterus, cervix and adnexa surgically absent.  Normal rectum.  There is no nodularity in the posterior culdesac or rectovaginal septum.       Musculoskeletal:         General: Normal range of motion.      Cervical back: Normal range of motion.   Lymphadenopathy:     She has no cervical adenopathy.   Neurological: She is alert and oriented to person, place, and time.   Skin: Skin is warm and dry.   Psychiatric: Her behavior is normal.        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    R ovary and fallopain tube-  Serous adenocarcinoma, high grade, arising from the fallopian tube, infarcted consistent with  torsion. Serous carcionma in situ.       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     Posterior cul de sac biopsy-metastatic adenocarcinoma.     Omentectomy- metastatic adenocarcinoma     LND-negative     Residual right IP ligament, Anterior cul de sac biopsy, right pelvic side will biopsy, left pelvic side wall, right gutter and left gutter-->benign    04/06/2017:   CT [name]- mild small bowel obstruction with transition point in the left pelvis adjacent to the anterior abdominal wall. Low density in the common femoral veins bilaterally thought likely to represent flow artificat, may consider follow up with lomer extremity Doppler to ensure there is no underlying deep venous thrombosis.    01/29/2018:   CTAP:  Interval development of necrotic retroperitoneal lymphadenopathy with largest node measuring 4.9  4.2 x 4.8 cm. Origin is indeterminate.  Options would include CT directed biopsy as well as PET/CT.    02/18/2018:   CA 125:  197.8    02/26/2018:   CT guided Left retroperitoneal node biopsy- focally involved by non-small cell malignancy- additional stains are diagnostic of non-small cell carcionoma and supportive of involve16ment by the patients reported previous known ovarian malignancy.    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  No other focal areas of FD  avid malignancy are found. Specifically, no lymph node activity is found outside of the retroperitoneum and there is no evidence for FDG avid pulmonary, skeletal or hepatic parenchymal metastasis.    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 No retroperitoneal, intra-abdominal, pelvic or inguinal adenopathy, no evidence for peritoneal surface neoplastic involvement. 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 She completed 6 cycles of chemotherapy with Carboplatin and Paclitaxel-weekly (C6D1 was 08/27/18). CA 125 was 8.7 at C6.     She underwent CT CAP on 09/22/2018 revealed ned in chest and interval decrease in size of a left periaortic necrotic lymph node from 1.8 to 1.6 cm. 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     PET on 10/15/18 shows reassuring intraabdominal findings with resolution of the previous hypermetabolic para-aortic lymph nodes and hepatic dome lesions, but did note uptake within the thyroid. She underwent thyroid US on 10/20/18 with a 1.2 cm irregular hypoechoic nodule in the left thyroid lobe is suspicious. Recommend FNA for further evaluation.     She initiated maintenance rubraca 11/2018 but  ultimately elected to stop in 02/2019 due to side  effects.   Patient had reestarted drug at 500 mg po BID once she got down to [place]-. Was in [place] from 12/2018-05/2019. Reports her oncologist down in [place] had changed doses of Rubraca a few times, to 300mg  po BID but decided to stop drug all together back in 02/2019 2/2 to decreased QOL. She does not wish to restart drug at this time. Last CT scan 03/02/2019 in [place], no evidence of recurrent or new neoplasm.     She is seen today for surveillance. She is overall doing well with NED on exam and pending CA 125 today. We will see her back when ever she is back in town, as lives primarily in [place]  for continued surveillance. She knows to be aware for any signs or symptoms of recurrence such as vaginal bleeding, pain or gastrointestinal or urinary changes, and will call if any of these occur.    08/21/2022:   CA 125: 8.5, NED, CA 125 up a little from baseline of 5-6- ,sent MyChart message offering  CTs as she has had some GI issues recently- resolved w omeprazole       Thyroid Nodule:   Incidental finding on PET scan of hypermetabolic focus within left thyroid nodule. Ultrasound performed on 10/20/18 with 1.2 cm irregular hypoechoic nodule, appearing suspicious. Recommend FNA for further evaluation, which was ordered. Pt has not followed up to date.  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 Referral to ENT made, but patient did not see them. She followed up with her PCP and was started on Synthroid. Last TSH 5.91. Repeat TSH/T4/T3 labs wnl 11/2020     Genetics:  previously discussed her negative genetic testing results.    Pain    Pain Score: Zero (08/21/2022 10:50 AM)    She denies presence of pain.  Pain will be reassessed at next visit.          Dispo: return to clinic 6 months         Lawerance Cruel, PGY-2  OB/GYN     I saw and personally examined the patient today with my resident. I discussed the findings and therapeutic plan with the  patient. I repeated, reviewed and agree with the history of present illness, past medical histories, family history, social history, medication list, and allergies as listed. The review of systems is as noted above. My physical exam confirms the findings listed above. Review of labs, pathology reports, radiograph reports, and medical records confirm the findings noted above. I agree with the assessment and plan as noted above. I have edited the note where appropriate.     I have personally performed a face to face diagnostic evaluation on this patient, seen initially by the resident. I have personally developed the care plan as outlined in the Assessment and Plan.     This note was completely edited, written and reviewed by me and consists of information cut and pasted from the my most recent visit, my smart phrases and other Epic tools. I have personally reviewed all aspects of this note to at least include reviewing this patient's chart and problem list, updating the history, physical exam, lab and procedure results, and assessment and plan as detailed above and below. As such this visit note reflects my current evaluation and management for this patient.    This note was copied forward from the note written by me on 07/04/2021. I have reviewed and updated the history, physical exam, data, assessment and plan of the note  so that it reflects the evaluation and management of the patient on 08/21/2022.        Complexity: mod     I spent a total of 35 minutes was spent prior to, during and after the patient encounter in counseling and/or coordination of care, documentation and counseling with patient and/or family.      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  08/21/2022:   CA 125: 8.5, NED, offered CTs as she has had some GI issues recently- resolved w omeprazole       Maggie Font, MD, FACOG  Associate Professor, Obstetrics and  Gynecology  Division of Gynecologic Oncology    Medical Decision Making:  The following items were considered in medical decision making:  Review / order clinical lab tests  Review / order radiology tests  Review / order other diagnostic tests/interventions

## 2022-08-21 NOTE — Unmapped (Signed)
Patient called in and changed her mind and wanted CT's. Spoke to Dr. [name] and CT's were ordered. Transferred patient to imaging to schedule.

## 2022-08-21 NOTE — Unmapped (Signed)
Addended by: Maggie Font on: 08/21/2022 02:10 PM     Modules accepted: Orders

## 2022-08-25 ENCOUNTER — Inpatient Hospital Stay: Admit: 2022-08-25 | Discharge: 2022-08-25 | Payer: Medicare (Managed Care)

## 2022-08-25 DIAGNOSIS — C569 Malignant neoplasm of unspecified ovary: Secondary | ICD-10-CM

## 2022-08-25 LAB — POC SAMPLE TYPE

## 2022-08-25 LAB — POCT CREATININE: POC Creatinine: 0.51 mg/dL (ref 0.60–1.30)

## 2022-08-25 LAB — POC EGFR: POC GFRckd21: >90

## 2022-08-25 MED ORDER — OMNIPAQUE (iohexol) 240 mg iodine/mL 50 mL
240 | Freq: Once | INTRAVENOUS | Status: AC | PRN
Start: 2022-08-25 — End: 2022-08-25
  Administered 2022-08-25: 13:00:00 50 mL via ORAL

## 2022-08-25 MED ORDER — OMNIPAQUE (iohexol) 350 mg iodine/mL 50 mL
350 | Freq: Once | INTRAVENOUS | Status: AC | PRN
Start: 2022-08-25 — End: 2022-08-25
  Administered 2022-08-25: 13:00:00 125 mL via INTRAVENOUS

## 2022-08-25 MED FILL — OMNIPAQUE 350 MG IODINE/ML INTRAVENOUS SOLUTION: 350 350 mg iodine/mL | INTRAVENOUS | Qty: 50

## 2022-08-25 MED FILL — OMNIPAQUE 240 MG IODINE/ML INTRAVENOUS SOLUTION: 240 240 mg iodine/mL | INTRAVENOUS | Qty: 50

## 2022-08-26 NOTE — Unmapped (Signed)
Patient called wants to come in as soon as possible about her scan. Patient wants to speak to nurse about plan going forward. Patient requesting call back.

## 2022-08-26 NOTE — Unmapped (Signed)
Called and spoke to the patient who is feeling overwhelmed and anxious. She states she wants to see Dr. B this week. Advised that Dr. Leonard Schwartz could see her this week, but there would be no further information that Dr. B could provide at this meeting that she did not state in her message as we do not have enough data (imaging) needed to determine next steps.  Advised that I would ask our [facility]SCs to reach out to radiology to see if we could get the patient in sooner for her PET (as scheduled on 10/3 now).  Pt anxious about being away from her home in [place] and upcoming vacation she has planned in January.   Told patient we would call her back once we have reached out to radiology.

## 2022-08-29 ENCOUNTER — Inpatient Hospital Stay: Admit: 2022-08-29 | Discharge: 2022-09-03 | Payer: Medicare (Managed Care)

## 2022-08-29 ENCOUNTER — Inpatient Hospital Stay: Admit: 2022-08-29 | Payer: Medicare (Managed Care)

## 2022-08-29 DIAGNOSIS — C569 Malignant neoplasm of unspecified ovary: Secondary | ICD-10-CM

## 2022-08-29 LAB — POC GLU MONITORING DEVICE: POC Glucose Monitoring Device: 120 mg/dL (ref 70–100)

## 2022-08-29 MED ORDER — F-18 FDG 9.16 millicurie
Freq: Once | Status: AC | PRN
Start: 2022-08-29 — End: 2022-08-29
  Administered 2022-08-29: 18:00:00 9.16 via INTRAVENOUS

## 2022-09-04 ENCOUNTER — Ambulatory Visit: Admit: 2022-09-04 | Discharge: 2022-09-04 | Payer: Medicare (Managed Care)

## 2022-09-04 DIAGNOSIS — C569 Malignant neoplasm of unspecified ovary: Secondary | ICD-10-CM

## 2022-09-04 MED ORDER — proCHLORPERazine (COMPAZINE) 10 MG tablet
10 | ORAL_TABLET | Freq: Four times a day (QID) | ORAL | 1 refills | PRN
Start: 2022-09-04 — End: 2022-09-16

## 2022-09-04 MED ORDER — dexAMETHasone (DECADRON) 4 MG tablet
4 | ORAL_TABLET | ORAL | 4 refills | 10.00000 days | Status: AC
Start: 2022-09-04 — End: ?

## 2022-09-04 NOTE — Unmapped (Signed)
MYCHART SUPPORT PHONE NUMBER:  [phone]

## 2022-09-04 NOTE — Unmapped (Signed)
Pharmacy/Nursing Chemotherapy/Immunotherapy Education - Initial Encounter    Kelsey Sullivan is a 81 y.o. female scheduled for chemotherapy with carbo/gem/bev for the treatment of recurrent ovarian cancer.  Anticipated day 1 is 09/16/22. Patient's primary oncologist is Dr. Claretta Fraise Billingsley.  Preliminary education was provided on 09/04/2022 to the patient.     Disease:    Cancer Staging   Malignant neoplasm of ovary (CMS-HCC)  Staging form: Ovary, Fallopian Tube, And Primary Peritoneal Carcinoma, AJCC 8th Edition  - Clinical: FIGO Stage IIIC - Signed by Mikle Bosworth, MD on 04/01/2018      History of Treatment:  Carbo/taxol    Chemotherapy doses and schedule   CARBOplatin AUC 4 /gemcitabine / bevacizumab, GynOnc (21 day cycle)  Aghajanian C et al. (OCEANS). J Clin Oncol. 2012 Jun 10;30(17):2039-45. doi: 10.1200/JCO.2012.42.0505. Epub 2012 Apr 23.    Regimen:   CARBOplatin AUC 4 IV over 30 minutes, day 1  Gemcitabine 1,000 mg/m2 IV over 30 minutes, days 1, 8  Bevacizumab 15 mg/kg IV over 30 min, day 1    Antiemesis regimen:  Palonosetron 0.25 mg IV, day 1  Aprepitant 130 mg IVP, day 1  Ondansetron 8 mg PO/IV once, day 8  Dexamethasone 12 mg PO day 1, 8 mg PO daily on days 2,3,4  Prochlorperazine 10 mg PO q6h PRN nausea/vomiting    Plan for 6 cycles     Medication Reconciliation and Allergies were verified with the patient and are as follows:   Allergies   Allergen Reactions    Nsaids (Non-Steroidal Anti-Inflammatory Drug)      Stomach ache    Sulfamethoxazole-Trimethoprim Other (See Comments)     Hallucinations, anxiety  Other reaction(s): Unknown  loopy  hallucinations    Celecoxib Nausea And Vomiting    Metoclopramide Other (See Comments)     Does not remember       Prior to Admission medications    Medication Sig Start Date End Date Taking? Authorizing Provider   ascorbic acid, vitamin C, (VITAMIN C) 1000 MG tablet Take 1,000 mg by mouth daily.    Historical Provider, MD   cholecalciferol,  vitamin D3, (VITAMIN D3) 1000 units tablet Take 1 tablet (1,000 Units total) by mouth daily. 11/18/18   Mikle Bosworth, MD   dexAMETHasone (DECADRON) 4 MG tablet Take 8 mg by mouth daily with food on day 2, 3, and 4 of chemotherapy cycle 09/15/22   Mikle Bosworth, MD   GLUTATHIONE-L ORAL Take by mouth daily.    Historical Provider, MD   iodine-sodium iodide 2 % solution Apply topically if needed.    Historical Provider, MD   Lactobac no.41/Bifidobact no.7 (PROBIOTIC-10 ORAL) Take 1 capsule by mouth daily. [employer] brand    Historical Provider, MD   levothyroxine (SYNTHROID) 75 MCG tablet Take 75 mcg by mouth every morning before breakfast.    Historical Provider, MD   omeprazole (PRILOSEC OTC) 20 MG tablet Take 1 tablet (20 mg total) by mouth every morning before breakfast. 20mg  twice a day    Historical Provider, MD   omeprazole (PRILOSEC) 20 MG capsule Take 1 capsule (20 mg total) by mouth daily. 08/20/22   Historical Provider, MD   proCHLORPERazine (COMPAZINE) 10 MG tablet Take 1 tablet (10 mg total) by mouth every 6 hours as needed (For nausea and vomiting). 09/15/22   Mikle Bosworth, MD   UNABLE TO FIND multimineral    Historical Provider, MD   vit b complex w-b 12 tablet Take  1 tablet by mouth daily.    Historical Provider, MD   chlorhexidine (PERIDEX) 0.12 % solution  12/06/18 09/04/22  Historical Provider, MD   escitalopram oxalate (LEXAPRO) 10 MG tablet Take 1 tablet (10 mg total) by mouth every morning. 03/18/22 09/04/22  Historical Provider, MD   vitamin E 100 UNIT capsule Take 100 Units by mouth daily.  09/04/22  Historical Provider, MD         [facility] - [place], [place] - [address] AT [facility] [address]  [address]  [place] [address]  Phone: [phone] Fax: [phone]    [facility]  [address]  [address]  [place] [address]  Phone: [phone] Fax: [phone] Alternate Fax: [phone]    [facility] [id number] - [place], [place] - [address] AT [address]  [address]  [place] [address]  Phone: [phone] Fax: [phone]       The following topics were reviewed with the patient/caregiver:   - Arrive 20-30 min prior to scheduled appointment time for port access/IV placement and laboratory draw. Always have labs drawn prior to your chemotherapy appointment. On days where you see the doctor AND have an infusion, it is recommended to still obtain lab work first.     - If you have a port-a-cath, please wear clothing which will allow the nurses easy access to the port. Apply any numbing cream 30 min prior to access, and cover in plastic wrap.     - You may eat prior to treatment, and are welcome to bring your own snacks.      - Obtain your prescriptions from your pharmacy and bring them with you to your infusion appointment.     - Recommended to have caregiver present on initial day of therapy. One guest at a time may accompany patient in infusion room. Children under 14 years are not permitted in the infusion room.     - Pharmacy and nursing double check process explained.     - Total anticipated treatment time: 4 hours. This includes premedications and/or fluids, pharmacy preparation time, and chemotherapy infusion.     The following topics and adverse events were reviewed with the patient/caregiver:   Impact on quality of life (importance of prevention, ensuring communication of needs, etc)  Team contacts for both urgent and non-urgent issues, including number for nights and weekends  When to call for assistance vs when to present to the emergency room.   Low blood counts (WBC, Platelets, and RBC; risk for life-threatening infection, bleeding, fatigue)  Febrile neutropenia and monitoring for fever  Nausea/vomiting (use of preventative and rescue medications)  Damage to GI tract (mouth/esophageal sores, diarrhea and/or constipation, colitis, pancreatitis)  Infusion reactions (symptoms, and  education to contact infusion nurse with any changes during infusion)     Possible Barriers to Education/Adherence Include:   Social barriers (lack of social support, transportation, etc.)    Assessment of Understanding:   No caregiver present  Patient/caregiver fully engaged: Asked questions demonstrating understanding, able to teach-back    Pretesting and Access:   IV access: port referral placed  Prescriptions to send to pharmacy/provided to patient: dexamethasone, lidocaine cream, prochlorperazine      Materials provided:   [employer] Chemotherapy and You book (patient instructed that this reference does not take the place of calling the office with issues)  Chemocare information sheets/FDA med guide on carbo/gem/bev  Preparing for Infusion handout  [facility] Chemotherapy Tip sheet  IV/PO Hazardous Medication handling sheet  Business card and/or treatment team handout (contains primary contact number for treatment team, and off-hours number)    Additional materials to be provided by PharmD on cycle 1 day 1: Yes    Other Issues:   May need help with transportation to/from treatments    Thank you for the opportunity to participate in the care of Kelsey Sullivan.  Will continue to follow.    Ezra Sites    Time spent: 20 minutes    - Chemotherapy and You contains common adverse effects associated with chemotherapy, prevention strategies, and ways to treat.   - Eating Hints contains additional strategies for dietary management during chemotherapy.  - Chemocare and FDA medication guides discuss common adverse events for each agent, as well as prevention strategies  - Chemotherapy Tip Sheet includes the following information: Contact numbers for business hours (nurse triage line) and non-business hours (hospital operator), examples of when to call for adverse effects vs. when to seek emergency medical attention, infection prevention, mouth care, skin care, food safety, pet safety  - IV/PO  Hazardous Medication handling sheet discusses strategies for body fluid containment to prevent possible exposure to others. Topics discussed include duration of precautions, toileting, physical contact, laundry, eating utensils, body fluid handling.

## 2022-09-04 NOTE — Unmapped (Signed)
History of Present Illness  Kelsey Sullivan is a 81 y.o. 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 She declined adjuvant therapy. In 01/2018, she experienced mid abdominal pain, and seen by PCP and ED, with CT of abdomen and pelvis showing interval development of necrotic retroperitoneal lymphadenopathy with largest node measuring 4.9  4.2 x 4.8 cm. Her CA 125 on 02/18/18 was 197.8.  She underwent a ct-guided nodal biopsy on 02/26/18, with pathology confirming recurrent disease.  She completed 6 cycles of chemotherapy with Carboplatin and Paclitaxel-weekly (C6D1 was 08/27/18). CA 125 was 8.7 at C6.     She underwent CT CAP on 09/22/2018 revealed ned in chest and interval decrease in size of a left periaortic necrotic lymph node from 1.8 to 1.6 cm. 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 PET on 10/15/18 shows reassuring intraabdominal findings with resolution of the previous hypermetabolic para-aortic lymph nodes and hepatic dome lesions, but did note uptake within the thyroid. She underwent thyroid US on 10/20/18 with a 1.2 cm irregular hypoechoic nodule in the left thyroid lobe is suspicious. Recommended FNA for further evaluation, but she did not get this done.    She initiated maintenance rubraca 11/2018 but ultimately elected to stop in 02/2019 due to side effects.     She was seen for surveillance  08/21/2022 with an increase in her CA 125 to 8.5 - CT CAP and PET were checked and consistent with platinum sensitive recurrence #2.   CT CAP on 08/25/2022 noted 2.6 cm lesion along the dome of the right hepatic lobe may represent a peritoneal lesion along the undersurface of the diaphragm rather than a true hepatic parenchymal lesion. 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   PET 08/29/2022 noted two FDG avid foci at the superior surface of right hepatic lobe, favored to be peritoneal nodules along the undersurface of right hemidiaphragm, concerning for metastases.       08/25/2022:   PS Recurrence #2: CT AP: 2.6 cm lesion along the dome of the right hepatic lobe may represent a peritoneal lesion along the undersurface of the diaphragm rather than a true hepatic parenchymal lesion. 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   Lymphatics: Previously demonstrated left para-aortic lymph node has resolved in the interim. No new or enlarging lymph nodes. Post surgical changes related to lymph node dissection.   Peritoneum/Retroperitoneum: Subtle thickening of the peritoneum in the paracolic gutters, left greater than right. Mild thickening of the peritoneum in the pelvis posteriorly.   Ct C: NED- Few scattered sub-4 mm pulmonary nodules are unchanged, likely benign, with no new suspicious or enlarging pulmonary nodules for example 2 mm right lower lobe pulmonary nodule (series 5, image 224). 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   Plan PET to eval for FDG activity    08/29/2022:   PET:  Two FDG avid foci at the superior surface of right hepatic lobe, favored to be peritoneal nodules along the undersurface of right hemidiaphragm, concerning for metastases.      ABDOMEN  AND PELVIS: *  Two FDG avid foci are seen along the surface of the right liver dome with Max SUV 9.9 on axial image 112 and max SUV 5.1 on axial image 108. *  No additional abnormal liver uptake.     There are no changes to the cancer history HPI above.       Pt here for review of scans and plan of care.      Kelsey Sullivan is doing ok- the recurrence is upsetting.   She denies any abdominal/pelvic pain.   She denies any bloating or fullness. She had some cramping/GI issues- but has resolved w omeprazole.   She reports normal bowel/bladder habits.  She denies any GU concerns. Denies any dysuria, frequency, hematuria, flank pain or fevers.  She denies any vaginal bleeding or discharge.   She denies neuropathy.   She reports no leg swelling.   Her appetite is good, and reports no nausea or vomiting.     There are no changes to the cancer history HPI above.         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, ovary, omentum, cul de sac peritoneum 12/2012, plat sensitive recurrence 03/2018    Disposition: RTC 6 months with CA 125, genetic testing neg, foundation one testing results from [facility] in [place]- this was not performed   Current disease status: Neg for CS mutation, no VUS, lifetime remaining breast cancer risk -3.4%  Genetics: negative for mutations, breast cancer lifetime risk 3.4%  Survivorship plan: pending completion of treatment        Malignant neoplasm of ovary (CMS-HCC)   12/28/2012 Pathology    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    Omentum-biopsy for frozen section-metastatic adenocarcionma.  Residual right IP ligament-benign smooth muscle and fibroadiopse tissue containing thick walled blood vessels.    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  Anterior cul de sac biopsy-benign  fibroadipose tissue.   Posterior cul de sac biopsy-metastatic adenocarcinoma.  Right pelvic side will biopsy- benign fibroadipose tissue.  Left pelvic side wall, right gutter and Left gutter-benign fibroadipose tissue.  Omentectomy- metastatic adenocarcinoma  LND-negative     12/28/2012 Surgery    BSO, omentectomy, LND     12/28/2012 Initial Diagnosis    Malignant neoplasm of both ovaries (CMS Dx)     01/08/2013 - 01/08/2013 Systemic Therapy (Oral and IV)    Patient refused chemotherapy and did alternative treatment including a raw diet for 1 year.       04/06/2017 Imaging    CT [name]- mild small bowel obstruction with transition point in the left pelvis adjacent to the anterior abdominal wall. Low density in the common femoral veins bilaterally thought likely to represent flow artificat, may consider follow up with lomer extremity Doppler to ensure there is no underlying deep venous thrombosis.     01/29/2018 Imaging    CT [name]-  Interval development of necrotic retroperitoneal lymphadenopathy with largest node measuring 4.9  4.2 x 4.8 cm. Origin is indeterminate.  Options would include CT directed biopsy as well as PET/CT.     02/18/2018 Tumor Markers    CA 125:  197.8     02/26/2018 Biopsy    CT guided Left retroperitoneal node biopsy- focally involved by non-small cell malignancy- additional stains are diagnostic of non-small cell carcionoma and supportive of involve41ment by the patients reported previous known ovarian malignancy.     03/04/2018 Imaging    PET/CT-  FDG avid retroperitoneal lymphadenopathy as described above and an FDG avid lesion over the dome of the liver most likely a peritoneal implant rather than a liver metastasis.  No other focal areas of FD avid malignancy are found. Specifically, no lymph node activity is found outside of the retroperitoneum and there is no evidence for FDG avid pulmonary, skeletal or hepatic parenchymal metastasis.     04/12/2018 Procedure    IR port placed       04/15/2018 Genetic  Testing    Myraid Testing-Neg     04/16/2018 - 08/27/2018 Systemic Therapy (Oral and IV)    Treatment Summary   Treatment goal Disease control   Plan Name OP Gyn CARBOplatin AUC 6 / PACLitaxel Weekly   Status Active   Start Date 04/16/2018   End Date 09/10/2018 (Planned)   Provider Mikle Bosworth, MD   Chemotherapy PACLitaxel (TAXOL) 120 mg in sodium chloride 0.9 % 250 mL chemo infusion, 80 mg/m2 = 120 mg, Intravenous, Once, 4 of 6 cycles    CARBOplatin (PARAPLATIN) 480 mg in dextrose 5% in water (D5W) 250 mL chemo infusion, 480 mg, Intravenous, Once, 4 of 6 cycles  Dose modification: 497 mg (original dose 484.8 mg, Cycle 2, Reason: Other (See Comments))         04/16/2018:   C1 weekly taxol 80 mg/m2 D1,8,15, Carboplatin AUC 6 D1 q 21 days. PCEs on thurs, txt on fridays. EXAMS ON EVEN CYCLES     CA 125: 523.4     Genetic testing: Neg for CS mutation, no VUS, lifetime remaining breast cancer risk -3.4%    05/07/2018:   C2 weekly taxol 80 mg/m2 D1,8,15, Carboplatin AUC 6 D1 q 21 days. PCEs on thurs, txt on fridays. EXAMS ON EVEN CYCLES     CA 125: 162.2    05/20/2018:   Early PCE due to scheduling, due 6/21    05/28/2018  Delay C3 due to low ANC     CA 125: 25    06/04/2018  C3 weekly taxol 80 mg/m2 D1,8,15, Carboplatin AUC 6 D1 q 21 days. EXAMS ON EVEN CYCLES.      CA 125    06/25/2018:   HOLD chemo due to low anc (1047)- defer to next week, and change to q 28 days cycle to build in an off week to allow for counts to recover     CA 125: 10.6    07/02/2018:   C4 weekly taxol 80 mg/m2 D1,8,15, Carboplatin AUC 6 D1 q 28 days. EXAMS ON EVEN CYCLES.  Review her genetics testing results      CA 125 7.2    07/30/2018:  C5 weekly taxol 80 mg/m2 D1,8,15, Carboplatin AUC 6 D1 q 28 days. Neulasta Day 16      EXAMS ON EVEN CYCLES.      CA 125: 11.4    08/27/2018:   C6 weekly taxol 80 mg/m2 D1,8,15, Carboplatin AUC 6 D1  q 28 days. Neulasta Day 16. Order Ct CAP. RTC 10/11 (3 weeks)     EXAMS ON EVEN CYCLES. Discussed option of  maintenance parp I- pt not decided (doesn't want to feel tired anymore)     CA 125: 8.7       05/28/2018 Tumor Markers    Tumor markers tested:  CA 125.  Results:   25.     06/25/2018 Tumor Markers    Tumor markers tested:  CA 125.  Results:   10.6.     07/02/2018 Tumor Markers    Tumor markers tested:  CA 125.  Results:   7.2.     07/07/2018 Hospital Admission    Admit date: 7/31 - 07/13/18  Admission diagnosis: Partial SBO  Additional comments: On 7/31 she experienced abdominal pain at home that was sharp on her LLQ and had significant diarrhea and one episode of emesis. She came to be evaluated at the [facility]. She has a history of prior small bowel obstruction. She was give IL NS, 2mg  morphine IV, and 4mg  Zofran IV in clinic and admitted for further observation. The abdominal XR showed dilated bowel loops, concerning for obstruction. CT abdomen/pelvis showed dilation of the small bowel with transition point seen in the anterior mid abdomen near the midline.     07/07/2018 Imaging    Imaging Completed:  X-ray of  abdomen  Result: Nonspecific bowel gas pattern. The presence of multiple air-fluid levels in small bowel raises question of early or partial small bowel obstruction.     07/07/2018 Imaging    CTAP:Small bowel obstruction with transition point in the anterior mid abdomen near the midline. Small amount of free fluid in the abdomen, which is nonspecific. 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  Peritoneum: Small amount of free fluid in the abdomen. A para-aortic density with central low attenuation measures 2.5 x 2 cm and is significantly decreased in size from prior.       07/30/2018 Tumor Markers    Tumor markers tested:  CA 125.  Results:   11.4.     08/27/2018 Tumor Markers    Tumor markers tested:  CA 125.  Results:   8.7.     09/20/2018 Procedure    Port removed: While the skin surface appeared inflamed, there was no evidence of  infection in the port reservoir.       09/22/2018 Imaging    CT Chest-No evidence of thoracic metastatic disease.  Suspected small seroma in the base of the right neck.  Necrotic metastatic lesion could also have this appearance but considered less likely.    [name]- Interval decrease in size of a left periaortic necrotic lymph node. No evidence of new metastatic disease.     10/15/2018 Imaging    Imaging Completed:  PET scan of  whole body  Result: Resolution of the previous hypermetabolic para-aortic lymph nodes and hepatic dome lesions.  Hypermetabolic focus within the left thyroid lobe.  Consider further evaluation with ultrasound, if clinically indicated.       10/19/2018 Imaging    Imaging Completed:  Ultrasound of  neck  Result: 1.2 cm irregular hypoechoic nodule in the left thyroid lobe is suspicious. Recommend FNA for further evaluation.  1.0 cm nodule right thyroid lobe containing macrocalcification is indeterminate. Recommend follow-up thyroid ultrasound in one year to evaluate for stability.     10/21/2018 Tumor Markers    Tumor markers tested:  CA 125.  Results:   4.8.     11/12/2018 Tumor Markers    Tumor markers tested:  CA 125  Results:   5.5     11/12/2018 - 12/03/2018 Systemic Therapy (Oral and IV)    11/12/2018: C1 rubraca 600 mg po BID. RTC 2 weeks for labs (CBC, CMP, 4 weeks for PCE #2). Pt plans to go to [place] and may leave before PCE and re-est care at [facility] (Dr. Gerilyn Pilgrim). Referral to ENT placed (pt cancelled appt for 11/22) and FNA was prev ordered for thyroid nodule- but has not been done.        03/02/2019 Imaging    CT Scan from [facility]    No evidence for recurrent or new neoplasm. Increased colonic stool could indicate constipation. Postoperative changes     06/13/2019 Tumor Markers    Tumor markers tested: CA 125 Results:   5.0.     09/15/2019 Tumor Markers    CA 125-6.1     05/31/2020 Tumor Markers    Ca 125-5.9     08/16/2020 Tumor Markers    CA 125-6.2     02/06/2021 Tumor Markers     CA 125-4.6     08/25/2022 Imaging    CT a/p: 2.6 cm lesion along the dome of the right hepatic lobe may represent a peritoneal lesion along the undersurface of the diaphragm rather than a true hepatic parenchymal lesion. 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     CT chest: No evidence of intrathoracic metastatic disease.      08/29/2022 Imaging    PET: 1.  Two FDG avid foci at the superior surface of right hepatic lobe, favored to be peritoneal nodules along the undersurface of right hemidiaphragm, concerning for metastases.            Past Medical History  She  has a past medical history of Bowel obstruction (CMS-HCC) (2018) and Ovarian cancer (CMS-HCC) (2014).  Past Medical History:   Diagnosis Date    Bowel obstruction (CMS-HCC) 2018    Ovarian cancer (CMS-HCC) 2014       Past Surgical History  Past Surgical History:   Procedure Laterality Date    ABDOMINOPLASTY  1999    BLEPHAROPLASTY  2000    DILATION AND CURETTAGE OF UTERUS  1979    ELBOW SURGERY  1990    EYE SURGERY  1994    KNEE ARTHROPLASTY Right     ROTATOR CUFF REPAIR      TRANSUMBILICAL AUGMENTATION MAMMAPLASTY      VAGINAL HYSTERECTOMY  1979       Family History  History reviewed. No pertinent family history.    Family cancer history for ovarian, uterine and colon cancer is negative other than above.       Social History  Social History     Socioeconomic History    Marital status: Married     Spouse name: None    Number of children: None    Years of education: None    Highest education level: None   Occupational History    None   Tobacco Use    Smoking status: Some Days     Packs/day: 0.00     Types: Cigarettes    Smokeless tobacco: Never   Vaping Use    Vaping Use: Never used   Substance and Sexual Activity    Alcohol use: Yes    Drug  use: No    Sexual activity: Not Currently   Other Topics Concern    Caffeine Use Yes    Occupational Exposure No    Exercise Yes     Seat Belt Yes   Social History Narrative    Mammogram-6 years ago and it was normal. Pt stated she isn't getting them anymore.      Colonoscopy-10 years ago        Pt reports she can lie flat in bed without SOB.    Pt states she can walk a flight of stairs and city block without chest pain and SOB>       Social Determinants of Health     Financial Resource Strain: Not on file   Physical Activity: Not on file   Stress: Not on file   Social Connections: Not on file   Housing Stability: Not on file       Past OB/GYN History  G3P2  She reports menarche at age 89 and menopause at age 61-surgical.  She denies a history of STIs.  She denies a history of abnormal cervical cytology and reports her last cytologic examination she is unsure of. She has taken HRT.     Health maintenance:  Mammogram: Date 6 years ago Results normal  Colonoscopy: Date 10 years ago Results normal    Allergies  She is allergic to nsaids (non-steroidal anti-inflammatory drug), sulfamethoxazole-trimethoprim, celecoxib, and metoclopramide.    BMI  Body mass index is 21.18 kg/m.    Disease Status: No evidence of disease/remission (08/21/2022 12:27 PM)              Histories  She has a past medical history of Bowel obstruction (CMS-HCC) (2018) and Ovarian cancer (CMS-HCC) (2014).    She has a past surgical history that includes Dilation and curettage of uterus (1979); Vaginal hysterectomy (1979); Elbow surgery (1990); Eye surgery (1994); Abdominoplasty (1999); Blepharoplasty (2000); Transumbilical augmentation mammaplasty; Rotator cuff repair; and Knee Arthroplasty (Right).    Her family history is not on file.    She reports that she has been smoking. She has never used smokeless tobacco. She reports current alcohol use. She reports that she does not use drugs.    Allergies  Nsaids (non-steroidal anti-inflammatory drug), Sulfamethoxazole-trimethoprim, Celecoxib, and Metoclopramide    Medications  Outpatient Encounter Medications as of 09/04/2022    Medication Sig Dispense Refill    ascorbic acid, vitamin C, (VITAMIN C) 1000 MG tablet Take 1,000 mg by mouth daily.      chlorhexidine (PERIDEX) 0.12 % solution       cholecalciferol, vitamin D3, (VITAMIN D3) 1000 units tablet Take 1 tablet (1,000 Units total) by mouth daily. 30 tablet 0    escitalopram oxalate (LEXAPRO) 10 MG tablet Take 1 tablet (10 mg total) by mouth every morning.      GLUTATHIONE-L ORAL Take by mouth daily.      iodine-sodium iodide 2 % solution Apply topically if needed.      Lactobac no.41/Bifidobact no.7 (PROBIOTIC-10 ORAL) Take 1 capsule by mouth daily. [employer] brand      levothyroxine (SYNTHROID) 75 MCG tablet Take 75 mcg by mouth every morning before breakfast.      omeprazole (PRILOSEC OTC) 20 MG tablet Take 1 tablet (20 mg total) by mouth every morning before breakfast. 20mg  twice a day      omeprazole (PRILOSEC) 20 MG capsule Take 1 capsule (20 mg total) by mouth daily.      UNABLE TO FIND multimineral  vit b complex w-b 12 tablet Take 1 tablet by mouth daily.      vitamin E 100 UNIT capsule Take 100 Units by mouth daily.       No facility-administered encounter medications on file as of 09/04/2022.        Review of Systemssee HPI    Vitals  Blood pressure 148/73, pulse 72, temperature 97.3 F (36.3 C), temperature source Temporal, resp. rate 18, height 5' 4 (1.626 m), weight 123 lb 6.4 oz (56 kg), SpO2 100 %.    Physical Exam   Vitals reviewed.  Constitutional: She is oriented to person, place, and time. She appears well-developed.   HENT:   Head: Atraumatic.   Nose: Nose normal.   Neck: No thyromegaly present.   Cardiovascular: Normal rate.   Pulmonary/Chest: Effort normal.   Abdominal: Normal appearance.   Genitourinary:    Genitourinary Comments: def     Musculoskeletal:         General: Normal range of motion.      Cervical back: Neck supple.   Lymphadenopathy:     She has no cervical adenopathy.   Neurological: She is alert and oriented to person, place, and time.    Skin: Skin is warm and dry.   Psychiatric: Her behavior is normal.        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     Posterior cul de sac biopsy-metastatic adenocarcinoma.     Omentectomy- metastatic adenocarcinoma     LND-negative     Residual right IP ligament, Anterior cul de sac biopsy, right pelvic side will biopsy, left pelvic side wall, right gutter and left gutter-->benign    04/06/2017:   CT [name]- mild small bowel obstruction with transition point in the left pelvis adjacent to the anterior abdominal wall. Low density in the common femoral veins bilaterally thought likely to represent flow artificat, may consider follow up with lomer extremity Doppler to ensure there is no underlying deep venous thrombosis.    01/29/2018:   CTAP:  Interval development of necrotic retroperitoneal lymphadenopathy with largest node measuring 4.9  4.2 x 4.8 cm. Origin is indeterminate.  Options would include CT directed biopsy as well as PET/CT.    02/18/2018:   CA 125:  197.8    02/26/2018:   CT guided Left retroperitoneal node biopsy- focally involved by non-small cell malignancy- additional stains are diagnostic of non-small cell carcionoma and supportive of involve10ment by the patients  reported previous known ovarian malignancy.    03/04/2018:   PET/CT-  FDG avid  retroperitoneal lymphadenopathy as described above and an FDG avid lesion over the dome of the liver most likely a peritoneal implant rather than a liver metastasis.  No other focal areas of FD avid malignancy are found. Specifically, no lymph node activity is found outside of the retroperitoneum and there is no evidence for FDG avid pulmonary, skeletal or hepatic parenchymal metastasis.    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 No retroperitoneal, intra-abdominal, pelvic or inguinal adenopathy, no evidence for peritoneal surface neoplastic involvement. 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 She completed 6 cycles of chemotherapy with Carboplatin and Paclitaxel-weekly (C6D1 was 08/27/18). CA 125 was 8.7 at C6.     She underwent CT CAP on 09/22/2018 revealed ned in chest and interval decrease in size of a left periaortic necrotic lymph node from 1.8 to 1.6 cm. 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     PET on 10/15/18 shows reassuring intraabdominal findings with  resolution of the previous hypermetabolic para-aortic lymph nodes and hepatic dome lesions, but did note uptake within the thyroid. She underwent thyroid US on 10/20/18 with a 1.2 cm irregular hypoechoic nodule in the left thyroid lobe is suspicious. Recommend FNA for further evaluation.     She initiated maintenance rubraca 11/2018 but ultimately elected to stop in 02/2019 due to side effects.   Patient had reestarted drug at 500 mg po BID once she got down to [place]-. Was in [place] from 12/2018-05/2019. Reports her oncologist down in [place] had changed doses of Rubraca a few times, to 300mg  po BID but decided to stop drug all together back in 02/2019 2/2 to decreased QOL. She does not wish to restart drug at this time. Last CT scan 03/02/2019 in [place], no evidence of recurrent or new neoplasm.     She was seen for surveillance 08/21/2022 with an increase in her CA 125 to 8.5 - CT CAP and PET were checked and consistent with platinum sensitive recurrence #2.   CT CAP on 08/25/2022 noted 2.6 cm lesion along the dome of the right hepatic lobe may represent a peritoneal lesion along the undersurface of the diaphragm rather than a true hepatic parenchymal lesion. 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   PET 08/29/2022 noted two FDG avid foci at the superior surface of right hepatic lobe, favored to be peritoneal nodules along the undersurface of right hemidiaphragm, concerning for metastases.     We reviewed her scans and her recurrence. 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 She is is 4 years  from her last platinum based chemo. I would recommend platinum based chemotherapy again- and would recommend carboplatin, gemcitabine and bevacizumab.     The risk and potential benefits of chemotherapy as well as available alternatives have been reviewed.  The patient has verbalized clear understanding of the cancer diagnosis and the  reasons for the recommended therapy.  We discussed the risks and side effects of treatment, including allergic reaction, anemia, thrombocytopenia, neutropenia, neutropenic sepsis, kidney failure, peripheral neuropathy, and even death related to chemotherapy complications.  The patient's recent clinical and laboratory data have been reviewed and the treatment plan formulated.  Patient verbalized understanding of the plan and associated risks. Bev specific risks were reviewed- VTE, bleeding, Gi perforation, HTN, proteinuria.    At this time, she has decided to proceed with chemotherapy as recommended.    9/28.2023:   FU- pet c/w recurrence- offer carbo gem bev- start 10/10 at [facility], surg onc ref for port placement       Thyroid Nodule:   Incidental finding on PET scan of hypermetabolic focus within left thyroid nodule. Ultrasound performed on 10/20/18 with 1.2 cm irregular hypoechoic nodule, appearing suspicious. Recommend FNA for further evaluation, which was ordered. Pt has not followed up to date.  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 Referral to ENT made, but patient did not see them. She followed up with her PCP and was started on Synthroid. Last TSH 5.91. Repeat TSH/T4/T3 labs wnl 11/2020     Genetics:  previously discussed her negative genetic testing results.    Pain    Pain Score: Zero (09/04/2022  1:42 PM)    She denies presence of pain.  Pain will be reassessed at next visit.          Dispo: chemo 10/10         I saw and personally examined the patient today . I discussed the findings and therapeutic plan with the patient. I repeated, reviewed and agree with the history of present illness, past medical histories, family history, social history, medication list, and allergies as listed. The review of systems is as noted above. My physical exam confirms the findings listed above. Review of labs, pathology reports, radiograph reports, and medical records confirm the findings noted above.  I agree with the assessment and plan as noted above. I have edited the note where appropriate.     I have personally performed a face to face diagnostic evaluation on this patient. I have personally developed the care plan as outlined in the Assessment and Plan.     This note was completely edited, written and reviewed by me and consists of information cut and pasted from the my most recent visit, my smart phrases and other Epic tools. I have personally reviewed all aspects of this note to at least include reviewing this patient's chart and problem list, updating the history, physical exam, lab and procedure results, and assessment and plan as detailed above and below. As such this visit note reflects my current evaluation and management for this patient.    This note was copied forward from the note written by me on 08/21/22. I have reviewed and updated the history, physical exam, data, assessment and plan of the note so that it reflects the evaluation and management of the patient on 09/04/22        Complexity: high     I spent a total of 45 minutes was spent prior to, during and after the patient encounter in  counseling and/or coordination of care, documentation and counseling with patient and/or family.       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  9/28.2023:   FU- pet c/w recurrence- offer carbo gem bev- start 10/10 at [facility], surg onc ref for port placement       Maggie Font, MD, FACOG  Associate Professor, Obstetrics and Gynecology  Division of Gynecologic Oncology        Medical Decision Making:  The following items were considered in medical decision making:  Review / order clinical lab tests  Review / order radiology tests  Review / order other diagnostic tests/interventions

## 2022-09-05 NOTE — Unmapped (Signed)
Referrals placed for [facility] and [facility]

## 2022-09-09 ENCOUNTER — Ambulatory Visit: Payer: Medicare (Managed Care)

## 2022-09-09 NOTE — Unmapped (Signed)
Spoke to patient,confirmed that Dr. Allena Katz will place her port on 10/5 arriving at [facility] at 1100am for 1pm procedure. Shevoiced understanding regarding nothing to eat/drink after midnight the night prior to the surgery, She will need transportation to and from the hospital the day of surgery, the patient confirmed that this is an out patient procedure and that She will go home that day once recovery is complete, and finally that She should expect a call from the hospital surgical staff to update her medical history prior to the surgery.  She  will call with any questions or concerns.    Patient instructed to call [facility] anesthesia with any questions: [phone]     [facility] - [address], [place], [place] [address]  [facility] - [address] [place], [place], [address]

## 2022-09-09 NOTE — Unmapped (Signed)
09/09/22 0006   Pre-op Phone Call   Surgery Time Verified Yes  (09/11/22 at 1310)   Arrival Time Verified 1110   Surgery Location Verified Yes  [facility])   Remind patient to bring picture ID and insurance card Yes   Medical History Reviewed Yes   NPO Status Reinforced Yes  (NPO after midnight)   Ride and Caregiver Arranged Yes  (daughter)   Ride Caregiver Provider Caylee Vlachos   Instructions to bring current medication list Yes     Patient scheduled for Power Port Insertion with Dr. Allena Katz.     Reviewed patients chart. Call placed to patient, phone screen for upcoming procedure completed. Reviewed preop instructions with patient. No history of anesthesia complications.       Reviewed medication list. Avoid NSAIDS including ASA, Advil, Ibuprofen, Aleve, diclofenac, meloxicam or Naproxen from now until procedure. Ok to take Acetaminophen.     Instructed to hold all vitamins, herbal supplements, cannabis products, vitamin E & all mulit-viatmins for 2 weeks prior to surgery. Hold chondroitin & glucosamine for 2 days prior to surgery. Ok to continue ferrous sulfate, fish oil, melatonin, vitamin B12, iron, calcium, magnesium & potassium through surgery.    Instructed to hold the following medications the morning of surgery:  N/A  Instructed to take the following medications the morning of surgery:  Synthroid   Prilosec       Instructed the patient to notify their surgeons office and PCP if they begin to feel ill prior to surgery.     My Chart message sent with preop instructions.     Patient verbalizes understanding all instructions and denies further questions. Provided patient with phone number to contact this office with further questions or concerns.     Rosann Auerbach, RN   Anesthesia - Phone Screen Nurse   [phone]

## 2022-09-09 NOTE — Unmapped (Signed)
09/09/2022 3:41 PM  Confirmed patient's [place] address with [facility]

## 2022-09-09 NOTE — Unmapped (Signed)
SW received staff message from Kelsey Sites, RN stating patient is transferring care to [facility] for infusion treatments. Her first infusion is scheduled for 10/10 and she may need assistance with transportation. Kelsey Sullivan has made referrals to [employer] and [facility].    SW placed call to patient to discuss. Patient states her daughter is able to provide transportation to/from her treatment on 10/10, but in the future she may need assistance. She explains that her daughter will likely be able to drop her off to treatments but she may need a ride home. Her husband does not drive and her daughter is their only means of transportation.     SW asked if patient has received a call from [employer] yet. She states she has not but she did speak to someone with [facility]. SW explained that [facility] should be calling within the next couple days to discuss their services. They do provide transportation assistance. Patient voiced understanding. SW offered to meet with patient in person on 10/10 to discuss further. Patient agreeable.    SW will remain available as needs arise.    Kelsey Sullivan, MSW, [place]   [phone]

## 2022-09-09 NOTE — Unmapped (Signed)
Spoke to patient about port placement 10/5 patient said she will call me back to see if she can arrange transportation.

## 2022-09-10 NOTE — Unmapped (Signed)
This RN called and spoke with pt. Pt scheduled for port placement, tomorrow, 09/10/22 and pt states she started with sinus drainage yesterday. Pt states the sinus drainage is causing he rto cough. Pt is AFEBRILE. NO vomiting, NO diarrhea. Taking Zyrtect.    This RN instructed pt to call anesthesia dept @ [phone] and inform them of sinus drainage which is causing cough and see what they recommend.    Pt verbalized understanding

## 2022-09-10 NOTE — Unmapped (Addendum)
This RN called and spoke with pt. Informed pt that anesthesia dept, [facility]  contacted this RN and stated that the decision to cancel the port on 09/11/22 is deferred to the surgeon.    This RN then spoke with surgeon, Dr.Sameer Allena Katz and he advised to cancel the port and get it re-scheduled in approximately 10 days      As a result, will cancel port on 09/11/22    Pt with sinus drainage which is causing a cough. NO vomiting. No diarrhea. Afebrile    This RN notified Dr.Billingsley , Ree Edman, RN, Ezra Sites, and port scheduler, Beth Limke      Informed pt that our port scheduler will call pt to get port re-scheduled     Pt verbalized understanding .

## 2022-09-10 NOTE — Unmapped (Signed)
This RN received phone call from patient requesting that her port placement be rescheduled.  Patient advised she thinks she may have a cold.  This RN transferred telephone call to Dr. Eliane Decree office in surgical oncology, as port placement is scheduled for tomorrow.  Requested that patient contact the office with any questions or concerns, or with any new or worsening symptoms.  Patient verbalized understanding.

## 2022-09-10 NOTE — Unmapped (Signed)
Pt has a cold and needs to reschedule her port procedure for tomorrow

## 2022-09-11 NOTE — Unmapped (Signed)
Patient called in again wanting to get her port placement rescheduled. Advised patient that the scheduler is in clinic and she will call as soon as she is availbe. Patient then asked why it is taking more than an hour for someone to call her. Advised patient that it is not urgent and the team will contact her when she is available. Patient then asked a message be sent to Dr. Senaida LangeB's nurse so they can schedule the port placement. Advised patient that Dr. Senaida LangeB's nurse does not schedule port placements. Patient verbalized understanding.

## 2022-09-11 NOTE — Unmapped (Signed)
Patient called in and wanted to reschedule port placement. Patient requesting call back at [phone]

## 2022-09-12 MED ORDER — lidocaine (PF) 2% (20 mg/mL) Soln 20 mg
20 | Freq: Once | INTRAMUSCULAR | Status: AC | PRN
Start: 2022-09-12 — End: 2022-09-12

## 2022-09-12 NOTE — Unmapped (Signed)
Kelsey Sullivan's surgery date was changed from 09/11/2022 to 09/15/2022. She had previously been given her pre-op instructions by Donnal Debar. She stated that she still has her pre-op instructions. I notified her that her arrival time for surgery on 09/15/222 at [facility] is 9:50 am. She verbalized understanding of all information given to her.

## 2022-09-15 ENCOUNTER — Ambulatory Visit: Admit: 2022-09-15 | Payer: Medicare (Managed Care)

## 2022-09-15 MED ORDER — lidocaine (PF) 2% (20 mg/mL) Soln 20 mg
20 | Freq: Once | INTRAMUSCULAR | PRN
Start: 2022-09-15 — End: 2022-09-15

## 2022-09-15 MED ORDER — ceFAZolin (ANCEF) 2 g in sodium chloride 0.9% 100 mL ADDaptor IVPB
2 | INTRAMUSCULAR | PRN
Start: 2022-09-15 — End: 2022-09-15

## 2022-09-15 MED ORDER — traMADoL (ULTRAM) 50 mg tablet
50 | ORAL_TABLET | Freq: Four times a day (QID) | ORAL | 0 refills | Status: AC | PRN
Start: 2022-09-15 — End: 2022-09-22

## 2022-09-15 MED ORDER — lactated Ringers IV infusion
INTRAVENOUS
Start: 2022-09-15 — End: 2022-09-15

## 2022-09-15 MED ORDER — dextrose 10%-water (D10W) IV soln
INTRAVENOUS | PRN
Start: 2022-09-15 — End: 2022-09-15

## 2022-09-15 MED ORDER — BUPivacaine (PF) (SENSORCAINE) 0.5 % (5 mg/mL) 10 mL in lidocaine (PF) (XYLOCAINE) 10 mg/mL (1 %) 20 mL injection
0.5 | INTRAMUSCULAR | PRN
Start: 2022-09-15 — End: 2022-09-15
  Administered 2022-09-15: 16:00:00 10 via INTRAMUSCULAR

## 2022-09-15 MED ORDER — heparin (porcine) 5,000 unit/mL 2,500 Units in sodium chloride 0.9 % 250 mL IV infusion
5000 | INTRAMUSCULAR | PRN
Start: 2022-09-15 — End: 2022-09-15
  Administered 2022-09-15: 16:00:00 2500

## 2022-09-15 MED ORDER — lactated Ringers IV infusion
INTRAVENOUS | PRN
Start: 2022-09-15 — End: 2022-09-15
  Administered 2022-09-15: 15:00:00 via INTRAVENOUS

## 2022-09-15 MED ORDER — lidocaine (PF) 20 mg/mL (2 %) Soln
20 | INTRAVENOUS | PRN
Start: 2022-09-15 — End: 2022-09-15
  Administered 2022-09-15: 15:00:00 100 via INTRAVENOUS

## 2022-09-15 MED ORDER — propofol 10 mg/ml, 20mL vial (DIPRIVAN) INFUSION
10 | INTRAVENOUS | PRN
Start: 2022-09-15 — End: 2022-09-15
  Administered 2022-09-15: 15:00:00 100 via INTRAVENOUS

## 2022-09-15 MED ORDER — sterile water irrigation
PRN
Start: 2022-09-15 — End: 2022-09-15
  Administered 2022-09-15: 16:00:00 500

## 2022-09-15 MED ORDER — glucose chewable tablet 12 g
4 | ORAL | PRN
Start: 2022-09-15 — End: 2022-09-15

## 2022-09-15 MED ORDER — ceFAZolin (ANCEF) 2 g in sodium chloride 0.9% 100 mL ADDaptor IVPB
2 | INTRAMUSCULAR | Status: AC | PRN
Start: 2022-09-15 — End: 2022-09-15
  Administered 2022-09-15: 15:00:00 2 g via INTRAVENOUS

## 2022-09-15 MED ORDER — acetaminophen (TYLENOL) tablet 975 mg
325 | ORAL | Status: AC | PRN
Start: 2022-09-15 — End: 2022-09-15
  Administered 2022-09-15: 15:00:00 975 mg via ORAL

## 2022-09-15 MED ORDER — acetaminophen (TYLENOL) tablet 975 mg
325 | ORAL | PRN
Start: 2022-09-15 — End: 2022-09-15

## 2022-09-15 MED FILL — CEFAZOLIN 2 GRAM SOLUTION FOR INJECTION: 2 2 gram | INTRAMUSCULAR | Qty: 1

## 2022-09-15 MED FILL — XYLOCAINE-MPF 20 MG/ML (2 %) INJECTION SOLUTION: 20 20 mg/mL (2 %) | INTRAMUSCULAR | Qty: 5

## 2022-09-15 MED FILL — TYLENOL 325 MG TABLET: 325 325 mg | ORAL | Qty: 3

## 2022-09-15 NOTE — Unmapped (Signed)
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         Tameisha Covell NICOLE Kawana Hegel     Date: 09/15/2022  Time: 11:56 AM

## 2022-09-15 NOTE — Unmapped (Signed)
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     PROCEDURES PERFORMED:    1. Insertion of left subclavian central venous catheter.  2. Diagnostic fluoroscopy with interpretation for catheter positioning.     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 She requires central venous access  for systemic chemotherapy.  Risks and benefits of the procedure including the risk of pneumothorax were explained to the patient.  He agreed to proceed with the operation as outlined.     DETAILS OF PROCEDURE:  The patient was taken to the operating room and was positioned in supine position.  Shoulder was placed along the thoracic spine.  Arms were tucked at the side.  Monitored anesthesia care was provided by anesthesia colleagues.  The patient's neck and chest were prepped and draped in usual sterile fashion.  Time-out was performed identifying patient, patient identifier, procedure to be performed.  Everyone in the room agreed to proceed with operation as outlined.  The left subclavian vein approach was planned.  The skin and subcutaneous tissue was infiltrated with local anesthetic.  The left subclavian vein was accessed via an infraclavicular approach and a wire was advanced via the access needle.  The venous system was confirmed to be coursing in the superior vena cava.  Next, a left chest incision was made and carried down to the underlying clavipectoral fascia and a subcutaneous pocket was created.  Next, under direct fluoroscopic visualization, sheath and dilator were advanced over the wire in the venous system.  The wire and dilator removed.  The catheter was then inserted via the sheath into the venous system.  The sheath was removed  and using diagnostic fluoroscopy, the catheter was positioned so the tip of the catheter was in the superior vena cava at the cavoatrial junction.  The catheter was then trimmed and attached to the port.  The port was placed within the subcutaneous pocket.  The port was then accessed with 22-gauge Huber needle.  There was return  of dark venous appearing blood.  The port flushed easily and flushed with heparinized saline.  Final fluoroscopic run demonstrated a left chest subcutaneous port with a catheter inserted in the left subclavian vein and coursed into the superior vena cava.  There were no twists or kinks in the catheter itself.  The pocket was inspected.  Hemostasis ensured.  The incision was closed in layers with deep dermal subcuticular stitches.  Dermabond skin glue was applied as dressing.  The patient tolerated the procedure well and will be transferred back to same-day surgery where a chest x-ray obtained and will be ultimately discharged home.           Garrel Ridgel, MD

## 2022-09-15 NOTE — Unmapped (Signed)
Anesthesia Transfer of Care Note    Patient: Kelsey Sullivan  Procedure(s) Performed: Procedure(s) with comments:  INSERTION POWER PORT A CATH - Left Subclavian    Patient location: Same Day Surgery    Anesthesia type: No value filed.    Airway Device on Arrival to PACU/ICU: Nasal Cannula    IV Access: Peripheral    Monitors Recommended to be Used During PACU/ICU: Standard Monitors    Outstanding Issues to Address: None    Level of Consciousness: awake    Post vital signs:    Vitals:    09/15/22 1212   BP:    Pulse: 69   Resp: 16   Temp:    SpO2: 98%       Complications:  No notable events documented.    Date 09/14/22 0700 - 09/15/22 0659(Not Admitted) 09/15/22 0700 - 09/16/22 0659   Shift 0700-1459 1500-2259 2300-0659 24 Hour Total 0700-1459 1500-2259 2300-0659 24 Hour Total   INTAKE   IV Piggyback     100   100     Volume (mL) (ceFAZolin (ANCEF) 2 g in sodium chloride 0.9% 100 mL ADDaptor IVPB)     100   100   Shift Total(mL/kg)     100(1.8)   100(1.8)   OUTPUT   Shift Total(mL/kg)           Weight (kg)     55.3 55.3 55.3 55.3

## 2022-09-15 NOTE — Unmapped (Signed)
Surgical Oncology History and Physical Note    Patient: Kelsey Sullivan Admitting Service: Surgical Oncology   MRN: [id number] Age: 81 y.o.     Attending: Garrel Ridgel, MD       Date of Admission: 09/15/2022  9:54 AM    Chief Complaint: Need for central venous access        History of Present Illness:   Patient is a 81 y.o. 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 The patient will be needing [place] gem bev  systemic therapy and therefore presents for placement of a Port A Cath    Review of System:  Constitutional: Negative for activity change, chills, diaphoresis, fatigue and fever.   HENT: Negative.    Eyes: Negative.    Respiratory: Negative for cough, chest tightness, shortness of breath and wheezing.  Cardiovascular: Negative for chest pain.   Gastrointestinal: Negative for abdominal distention, abdominal pain, blood in stool, nausea and vomiting.   Endocrine: Negative.    Genitourinary: Negative.    Musculoskeletal: Negative.    Skin: Negative.    Allergic/Immunologic: Negative.   Neurological: Negative for light-headedness and headaches.   Hematological: Negative.    Psychiatric/Behavioral: Negative.          Medications:  Medications Prior to Admission   Medication Sig Dispense Refill Last Dose    ascorbic acid, vitamin C, (VITAMIN C) 1000 MG tablet Take 1 tablet (1,000 mg total) by mouth daily.       cholecalciferol, vitamin D3, (VITAMIN D3) 1000 units tablet Take 1 tablet (1,000 Units total) by mouth daily. 30 tablet 0     dexAMETHasone (DECADRON) 4 MG tablet Take 8 mg by mouth daily with food on day 2, 3, and 4 of chemotherapy cycle 18 tablet 4     GLUTATHIONE-L ORAL Take by mouth daily.       iodine-sodium iodide 2 % solution Apply topically if needed.       Lactobac no.41/Bifidobact no.7 (PROBIOTIC-10 ORAL) Take 1 capsule by mouth daily. [employer] brand       levothyroxine (SYNTHROID) 75 MCG tablet Take 1 tablet (75 mcg total) by mouth every morning before  breakfast.       omeprazole (PRILOSEC OTC) 20 MG tablet Take 1 tablet (20 mg total) by mouth every morning before breakfast. 20mg  twice a day       proCHLORPERazine (COMPAZINE) 10 MG tablet Take 1 tablet (10 mg total) by mouth every 6 hours as needed (For nausea and vomiting). 30 tablet 1     UNABLE TO FIND multimineral       vit b complex w-b 12 tablet Take 1 tablet by mouth daily.          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, ovary, omentum, cul de sac peritoneum 12/2012, plat sensitive recurrence 03/2018    Disposition: RTC 10/11 in [place] [facility] for D1C1-carbo/gem/bev  Port referral to surg onc  Current disease status: Neg for CS mutation, no VUS, lifetime remaining breast cancer  risk -3.4%  Genetics: negative for mutations, breast cancer lifetime risk 3.4%  Survivorship plan: pending completion of treatment        Malignant neoplasm of ovary (CMS-HCC)   12/28/2012 Pathology    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    Omentum-biopsy for frozen section-metastatic adenocarcionma.  Residual right IP ligament-benign smooth muscle and fibroadiopse tissue containing thick walled blood vessels.    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  Anterior cul de sac biopsy-benign fibroadipose tissue.   Posterior cul de sac biopsy-metastatic adenocarcinoma.  Right pelvic side will biopsy- benign fibroadipose tissue.  Left pelvic side wall, right gutter and Left gutter-benign  fibroadipose tissue.  Omentectomy- metastatic adenocarcinoma  LND-negative     12/28/2012 Surgery    BSO, omentectomy, LND     12/28/2012 Initial Diagnosis    Malignant neoplasm of both ovaries (CMS Dx)     01/08/2013 - 01/08/2013 Systemic Therapy (Oral and IV)    Patient refused chemotherapy and did alternative treatment including a raw diet for 1 year.       04/06/2017 Imaging    CT [name]- mild small bowel obstruction with transition point in the left pelvis adjacent to the anterior abdominal wall. Low density in the common femoral veins bilaterally thought likely to represent flow artificat, may consider follow up with lomer extremity Doppler to ensure there is no underlying deep venous thrombosis.     01/29/2018 Imaging    CT [name]-  Interval development of necrotic retroperitoneal lymphadenopathy with largest node measuring 4.9  4.2 x 4.8 cm. Origin is indeterminate.  Options would include CT directed biopsy as well as PET/CT.     02/18/2018 Tumor Markers    CA 125:  197.8     02/26/2018 Biopsy    CT guided Left retroperitoneal node biopsy- focally involved by non-small cell malignancy- additional stains are diagnostic of non-small cell carcionoma and supportive of involve42ment by the patients reported previous known ovarian malignancy.     03/04/2018 Imaging    PET/CT-  FDG avid retroperitoneal lymphadenopathy as described above and an FDG avid lesion over the dome of the liver most likely a peritoneal implant rather than a liver metastasis.  No other focal areas of FD avid malignancy are found. Specifically, no lymph node activity is found outside of the retroperitoneum and there is no evidence for FDG avid pulmonary, skeletal or hepatic parenchymal metastasis.     04/01/2018 Cancer Staged     Cancer Staging   Malignant neoplasm of ovary (CMS-HCC)  Staging form: Ovary, Fallopian Tube, And Primary Peritoneal Carcinoma, AJCC 8th Edition  - Clinical: FIGO Stage IIIC - Signed by Mikle Bosworth, MD on 04/01/2018          04/12/2018 Procedure    IR port placed       04/15/2018 Genetic Testing    Myraid Testing-Neg     04/16/2018 - 08/27/2018 Systemic Therapy (Oral and IV)    Treatment Summary   Treatment goal Disease control   Plan Name OP Gyn CARBOplatin AUC 6 / PACLitaxel Weekly   Status Active   Start Date 04/16/2018   End Date 09/10/2018 (Planned)   Provider Mikle Bosworth, MD   Chemotherapy PACLitaxel (TAXOL) 120 mg in sodium chloride 0.9 % 250 mL chemo infusion, 80 mg/m2 = 120 mg, Intravenous, Once, 4 of 6 cycles  CARBOplatin (PARAPLATIN) 480 mg in dextrose 5% in water (D5W) 250 mL chemo infusion, 480 mg, Intravenous, Once, 4 of 6 cycles  Dose modification: 497 mg (original dose 484.8 mg, Cycle 2, Reason: Other (See Comments))         04/16/2018:   C1 weekly taxol 80 mg/m2 D1,8,15, Carboplatin AUC 6 D1 q 21 days. PCEs on thurs, txt on fridays. EXAMS ON EVEN CYCLES     CA 125: 523.4     Genetic testing: Neg for CS mutation, no VUS, lifetime remaining breast cancer risk -3.4%    05/07/2018:   C2 weekly taxol 80 mg/m2 D1,8,15, Carboplatin AUC 6 D1 q 21 days. PCEs on thurs, txt on fridays. EXAMS ON EVEN CYCLES     CA 125: 162.2    05/20/2018:   Early PCE due to scheduling, due 6/21    05/28/2018  Delay C3 due to low ANC     CA 125: 25    06/04/2018  C3 weekly taxol 80 mg/m2 D1,8,15, Carboplatin AUC 6 D1 q 21 days. EXAMS ON EVEN CYCLES.      CA 125    06/25/2018:   HOLD chemo due to low anc (1047)- defer to next week, and change to q 28 days cycle to build in an off week to allow for counts to recover     CA 125: 10.6    07/02/2018:   C4 weekly taxol 80 mg/m2 D1,8,15, Carboplatin AUC 6 D1 q 28 days. EXAMS ON EVEN CYCLES.  Review her genetics testing results      CA 125 7.2    07/30/2018:  C5 weekly taxol 80 mg/m2 D1,8,15, Carboplatin AUC 6 D1 q 28 days. Neulasta Day 16      EXAMS ON EVEN CYCLES.      CA 125: 11.4    08/27/2018:   C6 weekly taxol 80 mg/m2 D1,8,15, Carboplatin AUC 6 D1 q 28 days. Neulasta Day 16. Order Ct CAP. RTC  10/11 (3 weeks)     EXAMS ON EVEN CYCLES. Discussed option of maintenance parp I- pt not decided (doesn't want to feel tired anymore)     CA 125: 8.7       05/28/2018 Tumor Markers    Tumor markers tested:  CA 125.  Results:   25.     06/25/2018 Tumor Markers    Tumor markers tested:  CA 125.  Results:   10.6.     07/02/2018 Tumor Markers    Tumor markers tested:  CA 125.  Results:   7.2.     07/07/2018 Hospital Admission    Admit date: 7/31 - 07/13/18  Admission diagnosis: Partial SBO  Additional comments: On 7/31 she experienced abdominal pain at home that was sharp on her LLQ and had significant diarrhea and one episode of emesis. [facility] She has a history of prior small bowel obstruction. She was give IL NS, 2mg  morphine IV, and 4mg  Zofran IV in clinic and admitted for further observation. The abdominal XR showed dilated bowel loops, concerning for obstruction. CT abdomen/pelvis showed dilation of the small bowel with transition point seen in the anterior mid abdomen near the midline.     07/07/2018 Imaging    Imaging Completed:  X-ray of  abdomen  Result: Nonspecific bowel gas pattern. The presence of multiple air-fluid levels in small bowel raises question of early or partial small bowel obstruction.     07/07/2018 Imaging    CTAP:Small bowel  obstruction with transition point in the anterior mid abdomen near the midline. Small amount of free fluid in the abdomen, which is nonspecific. 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  Peritoneum: Small amount of free fluid in the abdomen. A para-aortic density with central low attenuation measures 2.5 x 2 cm and is significantly decreased in size from prior.       07/30/2018 Tumor Markers    Tumor markers tested:  CA 125.  Results:   11.4.     08/27/2018 Tumor Markers    Tumor markers tested:  CA 125.  Results:   8.7.     09/20/2018 Procedure    Port removed: While  the skin surface appeared inflamed, there was no evidence of infection in the port reservoir.       09/22/2018 Imaging    CT Chest-No evidence of thoracic metastatic disease.  Suspected small seroma in the base of the right neck.  Necrotic metastatic lesion could also have this appearance but considered less likely.    [name]- Interval decrease in size of a left periaortic necrotic lymph node. No evidence of new metastatic disease.     10/15/2018 Imaging    Imaging Completed:  PET scan of  whole body  Result: Resolution of the previous hypermetabolic para-aortic lymph nodes and hepatic dome lesions.  Hypermetabolic focus within the left thyroid lobe.  Consider further evaluation with ultrasound, if clinically indicated.       10/19/2018 Imaging    Imaging Completed:  Ultrasound of  neck  Result: 1.2 cm irregular hypoechoic nodule in the left thyroid lobe is suspicious. Recommend FNA for further evaluation.  1.0 cm nodule right thyroid lobe containing macrocalcification is indeterminate. Recommend follow-up thyroid ultrasound in one year to evaluate for stability.     10/21/2018 Tumor Markers    Tumor markers tested:  CA 125.  Results:   4.8.     11/12/2018 Tumor Markers    Tumor markers tested:  CA 125  Results:   5.5     11/12/2018 - 12/03/2018 Systemic Therapy (Oral and IV)    11/12/2018: C1 rubraca 600 mg po BID. RTC 2 weeks for labs (CBC, CMP, 4 weeks for PCE #2). Pt plans to go to [place] and may leave before PCE and re-est care at [facility] (Dr. Gerilyn Pilgrim). Referral to ENT placed (pt cancelled appt for 11/22) and FNA was prev ordered for thyroid nodule- but has not been done.        03/02/2019 Imaging    CT Scan from [facility]    No evidence for recurrent or new neoplasm. Increased colonic stool could indicate constipation. Postoperative changes     06/13/2019 Tumor Markers    Tumor markers tested: CA 125 Results:   5.0.     09/15/2019 Tumor Markers    CA 125-6.1     05/31/2020 Tumor Markers    Ca 125-5.9      08/16/2020 Tumor Markers    CA 125-6.2     02/06/2021 Tumor Markers    CA 125-4.6     08/25/2022 Imaging    CT a/p: 2.6 cm lesion along the dome of the right hepatic lobe may represent a peritoneal lesion along the undersurface of the diaphragm rather than a true hepatic parenchymal lesion. 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     CT chest: No evidence of  intrathoracic metastatic disease.      08/29/2022 Imaging    PET: 1.  Two FDG avid foci at the superior surface of right hepatic lobe, favored to be peritoneal nodules along the undersurface of right hemidiaphragm, concerning for metastases.      09/16/2022 -  Systemic Therapy (Oral and IV)    Treatment Summary   Treatment goal Disease control   Plan Name OP Gyn CARBOplatin AUC 4 / Gemcitabine / Bevacizumab   Status Active   Start Date 09/16/2022 (Planned)   End Date 11/25/2022 (Planned)   Provider Mikle Bosworth, MD   Chemotherapy BEVACIZUMAB (AVASTIN) INFUSION, 15 mg/kg, Intravenous, Once, 0 of 4 cycles  GEMCITABINE INFUSION, 1,000 mg/m2, Intravenous, Once, 0 of 4 cycles                Past Medical History:   Diagnosis Date    Bowel obstruction (CMS-HCC) 2018    Ovarian cancer (CMS-HCC) 2014    Thyroid disease        Past Surgical History:   Procedure Laterality Date    ABDOMINOPLASTY  1999    BLEPHAROPLASTY  2000    DILATION AND CURETTAGE OF UTERUS  1979    ELBOW SURGERY  1990    EYE SURGERY  1994    KNEE ARTHROPLASTY Right     ROTATOR CUFF REPAIR      TRANSUMBILICAL AUGMENTATION MAMMAPLASTY      VAGINAL HYSTERECTOMY  1979       No family history on file.    Social History     Socioeconomic History    Marital status: Married     Spouse name: Not on file    Number of children: Not on file    Years of education: Not on file    Highest education level: Not on file   Occupational History    Not on file   Tobacco Use    Smoking status: Some Days     Packs/day: 0.00      Types: Cigarettes    Smokeless tobacco: Never    Tobacco comments:     Smokes 1 pack every three days    Vaping Use    Vaping Use: Never used   Substance and Sexual Activity    Alcohol use: Yes     Alcohol/week: 14.0 standard drinks     Types: 14 Shots of liquor per week    Drug use: No    Sexual activity: Not Currently   Other Topics Concern    Caffeine Use Yes    Occupational Exposure No    Exercise Yes    Seat Belt Yes   Social History Narrative    Mammogram-6 years ago and it was normal. Pt stated she isn't getting them anymore.      Colonoscopy-10 years ago        Pt reports she can lie flat in bed without SOB.    Pt states she can walk a flight of stairs and city block without chest pain and SOB>       Social Determinants of Health     Financial Resource Strain: Not on file   Physical Activity: Not on file   Stress: Not on file   Social Connections: Not on file   Housing Stability: Not on file         Vital Signs:  Vitals:    09/15/22 1024   BP: 152/88   Pulse: 71   Resp: 16   Temp: 98.2 F (  36.8 C)   SpO2: 98%       Physical Exam:  General: no apparent distress, conversant  Eyes: anicteric sclera, moist conjunctiva, pupils equal and round reactive to light  HENT: atraumatic mucous membranes moist  Neck: trachea midline, full range of motion, no thyromegaly or adenopathy  Cardiac: regular rate and rhythm, no murmurs, rubs, and gallops  Respiratory: clear to auscultation bilaterally, normal respiratory effort  Gastrointestinal: abdomen is soft nontender, nondistended, no palpable masses  Extremity: warm, no clubbing, cyanosis, or edema  Lymph: no palpable lymphadenopathy  Skin: no rashes or ulcers, normal temperature and turgor  Psych: appropriate affect, alert and oriented to person, place, and time        Labs:  Lab Results   Component Value Date    WBC 5.7 05/31/2020    RBC 3.64 (L) 05/31/2020    HCT 36.2 05/31/2020    MCV 99.3 05/31/2020    MCH 35.4 (H) 05/31/2020    MCHC 35.7 05/31/2020    RDW 13.4  05/31/2020    PLT 223 05/31/2020     Lab Results   Component Value Date    WBC 5.7 05/31/2020    RBC 3.64 (L) 05/31/2020    HCT 36.2 05/31/2020    MCV 99.3 05/31/2020    MCH 35.4 (H) 05/31/2020    MCHC 35.7 05/31/2020    RDW 13.4 05/31/2020    PLT 223 05/31/2020           Invalid input(s): LABALBU        Imaging:  CT Chest With IV contrast    Result Date: 08/25/2022  EXAM: CT CHEST WITH IV CONTRAST INDICATION: Ovarian cancer, recurrence COMPARISON: CT chest 09/22/2018. PET/CT 10/15/2018. TECHNIQUE: Multidetector CT imaging was obtained through the chest in the supine position during the administration of 50 mL of IOHEXOL 240 MG IODINE/ML INTRAVENOUS SOLUTION administered orally of IOHEXOL 350 MG IODINE/ML INTRAVENOUS SOLUTION administered intravenously. Additional axial MIP images were reconstructed. FINDINGS: MEDICAL DEVICES: None. AIRWAYS & LUNGS: The central airways are patent. Mild parenchymal bands in the right middle lobe and lung bases likely representing atelectasis/scarring. Mild emphysema. Few scattered sub-4 mm pulmonary nodules are unchanged, likely benign, with no new suspicious or enlarging pulmonary nodules for example 2 mm right lower lobe pulmonary nodule (series 5, image 224). . Nodular density related to the right hemidiaphragm on image 244 is related to an infradiaphragmatic peritoneal implant. PLEURA: No pleural effusions or pneumothorax. LOWER NECK: Unremarkable. HEART: The heart is normal in size. Mild coronary artery calcifications. Trace pericardial fluid seen anteriorly. VASCULAR STRUCTURES: Aorta and main pulmonary artery are normal in caliber. Usual 3-vessel branch configuration of a left aortic arch. Scattered calcified atherosclerotic plaques throughout the aorta and its branches. MEDIASTINUM AND HILA: No pathologically enlarged lymph nodes. CHEST WALL AND AXILLA: Unchanged appearance of bilateral breast prostheses. UPPER ABDOMEN: Reported separately. OSSEOUS STRUCTURES: No  suspicious osseous lesion or acute osseous abnormality. Postsurgical appearance of right rotator cuff repair.     IMPRESSION: No evidence of intrathoracic metastatic disease. I have personally reviewed the images and I agree with this report. Report Verified by: Odie Sera, MD at 08/25/2022 3:56 PM EDT    CT Abdomen and Pelvis With IV contrast    Result Date: 08/25/2022  EXAM: CT ABDOMEN AND PELVIS WITH IV CONTRAST INDICATION: Ovarian cancer, recurrence, inc CA 125, abdominal cramping TECHNIQUE: CT of the abdomen and pelvis was performed after the administration of intravenous contrast. Axial images were obtained with coronal and  sagittal reconstructions. CONTRAST: 50 mL of IOHEXOL 240 MG IODINE/ML INTRAVENOUS SOLUTION administered orally of IOHEXOL 350 MG IODINE/ML INTRAVENOUS SOLUTION administered intravenously FIELD OF VIEW: 36 cm COMPARISON: 09/22/2018. FINDINGS: Lower chest: Reported separately. Liver: 2.5 cm lesion along the dome of the right hepatic lobe (series 6, image 12; series 602, image 42). Biliary tree: Unremarkable. Spleen: Unremarkable. Pancreas: Unremarkable. Adrenal glands: Unremarkable. Kidneys/Ureters/Bladder: Unremarkable. Gastrointestinal tract: Unremarkable. Lymphatics: Previously demonstrated left para-aortic lymph node has resolved in the interim. No new or enlarging lymph nodes. Post surgical changes related to lymph node dissection. Vasculature: Moderate calcific atherosclerosis. Peritoneum/Retroperitoneum: Subtle thickening of the peritoneum in the paracolic gutters, left greater than right. Mild thickening of the peritoneum in the pelvis posteriorly. Abdominal wall/soft tissues: Mild soft tissue edema. Genital Organs: Uterus and ovaries are surgically absent. Osseous structures: Mild left convex curvature of the lumbar spine. Moderate left and mild right hip arthrosis. Multilevel degenerative disease and facet arthropathy. Grade 1 anterolisthesis of L3 on L4 and L4 on L5  secondary to facet arthropathy.     IMPRESSION: 2.6 cm lesion along the dome of the right hepatic lobe may represent a peritoneal lesion along the undersurface of the diaphragm rather than a true hepatic parenchymal lesion. 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 Report Verified by: Roseanne Kaufman, MD at 08/25/2022 12:26 PM EDT    NM PET CT imaging Skull to Thigh    Result Date: 08/29/2022  EXAM:  NM PET CT IMAGING EXAM: SKULL TO THIGH INDICATION:  Ovarian cancer, recurrence, inc CA 125 slightly, new findings on CT RADIOPHARMACEUTICAL:  9.16 millicurie of F-18 FDG INJECTION administered intravenously TECHNICAL:  Positron emission tomography with concurrently acquired non-breath-hold low dose computed tomography was performed from above the skull base to the thigh. No IV contrast material was administered. Limited CT imaging is done for attenuation correction and anatomic localization purposes. It is limited by lower dose and respiratory motion. Injection site, left antecubital fossa. Uptake time:  61 minutes Fingerstick glucose at the time of radiopharmaceutical injection was 120 mg/dL. Liver background mean SUV for reference is 2.0. COMPARISON: CT chest, abdomen, pelvis dated 08/25/2022. PET/CT dated 10/15/2018. FINDINGS: HEAD/NECK: Focal FDG uptake noted in the right maxillary sinus related to the root of an upper right molar tooth, likely periodontal disease on axial image 29. Focal FDG uptake within the left thyroid lobe on the prior PET/CT has since resolved. Streak artifact from dental amalgam. CHEST: *  Subcentimeter right cardiophrenic lymph node with FDG uptake, Max SUV 1.8, on axial image 121. Few subcentimeter pulmonary nodules on CT without FDG uptake. For example, right lower lobe nodule on axial image 95. Few parenchymal fibrotic bands are seen in the middle and lingular lobes. Bilateral breast implants noted. Scattered  atherosclerotic calcification along the aorta. ABDOMEN AND PELVIS: *  Two FDG avid foci are seen along the surface of the right liver dome with Max SUV 9.9 on axial image 112 and max SUV 5.1 on axial image 108. *  No additional abnormal liver uptake. Segmental FDG uptake is noted along the distal ileal loops, cecum and ascending colon, likely physiologic. Linear FDG uptake is noted along the distal esophagus/GE junction, likely inflammatory/reflux related. Activity in the urinary and gastrointestinal tracts is likely physiologic. Post hysterectomy and bilateral oophorectomy status. Anterior abdominal wall shows postsurgical changes. SKELETON AND EXTREMITIES: No suspicious areas of uptake. Scoliosis and multilevel degenerative changes of the spine with associated FDG  uptake. Grade 1 degenerative anterolisthesis of L3 over L4 and L4 over L5. Postsurgical changes at the right proximal humerus. Focal uptake within the right shoulder musculature, likely physiologic or inflammatory/muscle strain.     IMPRESSION: 1.  Two FDG avid foci at the superior surface of right hepatic lobe, favored to be peritoneal nodules along the undersurface of right hemidiaphragm, concerning for metastases. Approved by Hebert Soho on 08/29/2022 4:33 PM EDT I have personally reviewed the images and I agree with this report. Report Verified by: Excell Seltzer, MD at 08/29/2022 6:29 PM EDT        Assessment and Plan  A 81 y.o. 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 To OR today for placement of a Port A Cath  2. The risks, benefits, and alternatives of the proposed procedure were discussed with the patient and their family in detail which includes bleeding, infection, pneumothorax, and hemothorax. They were given the opportunity to ask questions and have those questions answered. They signed, witnessed, written informed consent.

## 2022-09-15 NOTE — Unmapped (Addendum)
[place]  DEPARTMENT OF ANESTHESIOLOGY  PRE-PROCEDURAL EVALUATION    Kelsey Sullivan is a 81 y.o. 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       I have reviewed the History and Physical Exam, any relevant changes are noted in the anesthesia pre-operative evaluation.      Cardiovascular:      (+) hyperlipidemia.          (-) hypertension.  ROS comment:   No TEE/ cath noted    Neuro/Muscoloskeletal/Psych:    (+) arthritis (Knee, shoulder).         Pulmonary:        GI/Hepatic/Renal:        Endo/Other:    (+) hypothyroidism (TSH 2.9 last year\).            Past Medical History     Past Medical History:   Diagnosis Date    Bowel obstruction (CMS-HCC) 2018    Ovarian cancer (CMS-HCC) 2014    Thyroid disease        Past Surgical History     Past Surgical History:   Procedure Laterality Date    ABDOMINOPLASTY  1999    BLEPHAROPLASTY  2000    DILATION AND CURETTAGE OF UTERUS  1979    ELBOW SURGERY  1990    EYE SURGERY  1994    KNEE ARTHROPLASTY Right     ROTATOR CUFF REPAIR      TRANSUMBILICAL AUGMENTATION MAMMAPLASTY      VAGINAL HYSTERECTOMY  1979       Family History     No family history on file.    Social History     Social History     Socioeconomic History    Marital status: Married     Spouse name: Not on file    Number of children: Not on file    Years of education: Not on file    Highest education level: Not on file   Occupational History    Not on file   Tobacco Use    Smoking status: Some Days     Packs/day: 0.00     Types: Cigarettes     Smokeless tobacco: Never    Tobacco comments:     Smokes 1 pack every three days    Vaping Use    Vaping Use: Never used   Substance and Sexual Activity    Alcohol use: Yes     Alcohol/week: 14.0 standard drinks     Types: 14 Shots of liquor per week    Drug use: No    Sexual activity: Not Currently   Other Topics Concern    Caffeine Use Yes    Occupational Exposure No    Exercise Yes    Seat Belt Yes   Social History Narrative    Mammogram-6 years ago and it was normal. Pt stated she isn't getting them anymore.      Colonoscopy-10 years ago  Pt reports she can lie flat in bed without SOB.    Pt states she can walk a flight of stairs and city block without chest pain and SOB>       Social Determinants of Health     Financial Resource Strain: Not on file   Physical Activity: Not on file   Stress: Not on file   Social Connections: Not on file   Housing Stability: Not on file       Medications     Allergies:  Allergies   Allergen Reactions    Nsaids (Non-Steroidal Anti-Inflammatory Drug)      Stomach ache    Sulfamethoxazole-Trimethoprim Other (See Comments)     Hallucinations, anxiety  Other reaction(s): Unknown  loopy  hallucinations    Celecoxib Nausea And Vomiting    Metoclopramide Other (See Comments)     Does not remember       Home Meds:  Prior to Admission medications as of 09/09/22 1349   Medication Sig Taking?   ascorbic acid, vitamin C, (VITAMIN C) 1000 MG tablet Take 1 tablet (1,000 mg total) by mouth daily.    cholecalciferol, vitamin D3, (VITAMIN D3) 1000 units tablet Take 1 tablet (1,000 Units total) by mouth daily.    dexAMETHasone (DECADRON) 4 MG tablet Take 8 mg by mouth daily with food on day 2, 3, and 4 of chemotherapy cycle    GLUTATHIONE-L ORAL Take by mouth daily.    iodine-sodium iodide 2 % solution Apply topically if needed.    Lactobac no.41/Bifidobact no.7 (PROBIOTIC-10 ORAL) Take 1 capsule by mouth daily. [employer] brand    levothyroxine (SYNTHROID) 75 MCG tablet  Take 1 tablet (75 mcg total) by mouth every morning before breakfast.    omeprazole (PRILOSEC OTC) 20 MG tablet Take 1 tablet (20 mg total) by mouth every morning before breakfast. 20mg  twice a day    proCHLORPERazine (COMPAZINE) 10 MG tablet Take 1 tablet (10 mg total) by mouth every 6 hours as needed (For nausea and vomiting).    UNABLE TO FIND multimineral    vit b complex w-b 12 tablet Take 1 tablet by mouth daily.        Inpatient Meds:  Scheduled:   Continuous:     PRN:     Vital Signs     Wt Readings from Last 3 Encounters:   09/04/22 123 lb 6.4 oz (56 kg)   08/21/22 121 lb (54.9 kg)   07/04/21 123 lb (55.8 kg)     Ht Readings from Last 3 Encounters:   09/04/22 5' 4 (1.626 m)   08/21/22 5' 4 (1.626 m)   07/04/21 5' 4 (1.626 m)     Temp Readings from Last 3 Encounters:   09/04/22 97.3 F (36.3 C) (Temporal)   08/21/22 97.5 F (36.4 C) (Temporal)   07/04/21 97.3 F (36.3 C) (Temporal)     BP Readings from Last 3 Encounters:   09/04/22 148/73   08/21/22 128/76   07/04/21 126/79     Pulse Readings from Last 3 Encounters:   09/04/22 72   08/21/22 65   07/04/21 68     @LASTSAO2 (3)@    Physical Exam     Airway:     Mallampati: II  Mouth Opening: >2 FB  TM distance: > = 3 FB  Neck ROM: full  Comment:   04/2014 LMA:   Mask ventilation easy; CO2 Waveform:Yes; Size:3; Insertion Attempts:1;    Dental:   - No obvious cracked,  loose, chipped, or missing teeth.     Pulmonary:      Breathing: unlabored       (-) no wheezes.    Cardiovascular:     Rhythm: regular  Rate: normal  (-) murmur.    Neuro/Musculoskeletal/Psych:     Mental status: alert and oriented to person, place and time.          Abdominal:       Current OB Status:       Other Findings:      Laboratory Data     Lab Results   Component Value Date    WBC 5.7 05/31/2020    HGB 12.9 05/31/2020    HCT 36.2 05/31/2020    MCV 99.3 05/31/2020    PLT 223 05/31/2020       No results found for: [facility]    Lab Results   Component Value Date    GLUCOSE 138 (H) 05/31/2020     BUN 18 05/31/2020    CO2 27 05/31/2020    CREATININE 0.75 05/31/2020    K 4.3 05/31/2020    NA 134 05/31/2020    CL 97 (L) 05/31/2020    CALCIUM 9.5 05/31/2020    ALBUMIN 4.6 05/31/2020    PROT 6.9 05/31/2020    ALKPHOS 72 05/31/2020    ALT 19 05/31/2020    AST 29 05/31/2020    BILITOT 0.5 05/31/2020       Lab Results   Component Value Date    INR 1.0 04/12/2018       No results found for: PREGTESTUR, PREGSERUM, HCG, HCGQUANT    Anesthesia Plan     ASA 2         Female    Anesthesia Type:  MAC.      PONV Risk Factors: female,  plan for postoperative opioid use.                    Anesthetic plan and risks discussed with patient.    Plan, alternatives, and risks of anesthesia, including death, have been explained to and discussed with the patient/legal guardian.  By my assessment, the patient/legal guardian understands and agrees.  Scenario presented in detail.  Questions answered.    Blood products not discussed.      Plan discussed with attending.

## 2022-09-15 NOTE — Unmapped (Signed)
Port Discharge Instructions    General Information:  You will have a clear glue dressing or steri-strips covered with gauze and adhesive dressing/tape on your incision. A small amount of blood may be present - this is normal.    Care of your incisions:  You may shower (do not scrub at incisions, okay to allow water to run over them):  One day after surgery  Once you have showered, pat incisions dry and leave steri-strips/glue in place.  No submersing in bath, hot tub, swimming pool, or lake for 6 weeks.  Ice packs may be applied to the incisions for 20 minutes per hour for 48 hours while awake to help with pain and swelling.    Medications:  Take your pain medication (tramadol) 1 tablet every 4 hours as needed or as directed. Take with food to minimize nausea and vomiting.   You may also take the additional medications if approved by your surgeon:  Acetaminophen 500 mg every 6 hours as needed for pain (do not take WITH another pain medication that contains acetaminophen).  Ibuprofen 600 mg four times a day with food.    Activities:  You may resume light activities and a regular diet. Do not lift anything heavier than a half-gallon of milk for at least 2 weeks.  Arm range of motion exercises are encouraged.  Do not drive   While taking pain medicine.  You may return to work in approximately:  1-2 days.    When to call the nurse/surgeon:  If your incision becomes increasingly red, sore, or warm to the touch.  If you develop a fever greater than 101.5.  If you experience chills, nausea, and/or vomiting.  If your incision becomes very swollen or hard with bruising.  If your incision starts to drain fluid or blood.    If you need medical assistance:  During business hours (8am-5pm), call [phone] and ask for your nurse (Cathy).  After business hours, call [phone] to be connected to the answering service and the on call physician.  Please call Lacey at [phone] if you want to schedule a follow up  appointment or have any other questions.    Office Locations:  [facility]; [address]; [address] [place], [place] [address] or   [facility]; [address]; 4th floor [place] [address]

## 2022-09-15 NOTE — Unmapped (Signed)
X-ray at bedside.

## 2022-09-15 NOTE — Unmapped (Signed)
INTRA-OP POST BRIEFING NOTE: Kelsey Sullivan      Specimens:     Prior to leaving the room: Nurse confirmed name of procedure, completion of instrument, sponge & needle counts, reads specimen labels aloud including patient name and addresses any equipment issues? Nurse confirmed wound class. Nurse to surgeon and anesthesia: What are key concerns for recovery and management of the patient?  Yes      Blood products stored at appropriate temperatures prior to return to blood bank (if applicable)? N/A      Patient identification band secured on patient prior to transfer out of the operating room? Yes    Temporary devices implanted for the duration of the surgery removed and evaluated for intactness and completeness prior to closure? N/A      Other Comments:     Signed: Darrel Hoover    Date: 09/15/2022    Time: 12:13 PM

## 2022-09-16 ENCOUNTER — Ambulatory Visit: Admit: 2022-09-16 | Payer: Medicare (Managed Care)

## 2022-09-16 ENCOUNTER — Ambulatory Visit: Admit: 2022-09-16 | Discharge: 2022-09-16 | Payer: Medicare (Managed Care)

## 2022-09-16 DIAGNOSIS — Z5111 Encounter for antineoplastic chemotherapy: Secondary | ICD-10-CM

## 2022-09-16 DIAGNOSIS — C569 Malignant neoplasm of unspecified ovary: Secondary | ICD-10-CM

## 2022-09-16 LAB — CBC
Hematocrit: 38.4 % (ref 35.0–45.0)
Hemoglobin: 13.1 g/dL (ref 11.7–15.5)
MCH: 34.7 pg (ref 27.0–33.0)
MCHC: 34.1 g/dL (ref 32.0–36.0)
MCV: 101.7 fL (ref 80.0–100.0)
MPV: 7.6 fL (ref 7.5–11.5)
Platelets: 197 10*3/uL (ref 140–400)
RBC: 3.78 10*6/uL (ref 3.80–5.10)
RDW: 12.6 % (ref 11.0–15.0)
WBC: 7.2 10*3/uL (ref 3.8–10.8)

## 2022-09-16 LAB — COMPREHENSIVE METABOLIC PANEL
ALT: 12 U/L (ref 7–52)
AST: 17 U/L (ref 13–39)
Albumin: 4.2 g/dL (ref 3.5–5.7)
Alkaline Phosphatase: 61 U/L (ref 36–125)
Anion Gap: 9 mmol/L (ref 3–16)
BUN: 19 mg/dL (ref 7–25)
CO2: 26 mmol/L (ref 21–33)
Calcium: 9.1 mg/dL (ref 8.6–10.3)
Chloride: 97 mmol/L (ref 98–110)
Creatinine: 0.76 mg/dL (ref 0.60–1.30)
EGFR: 79
Glucose: 154 mg/dL (ref 70–100)
Osmolality, Calculated: 279 mOsm/kg (ref 278–305)
Potassium: 3.9 mmol/L (ref 3.5–5.3)
Sodium: 132 mmol/L (ref 133–146)
Total Bilirubin: 0.6 mg/dL (ref 0.0–1.5)
Total Protein: 6.4 g/dL (ref 6.4–8.9)

## 2022-09-16 LAB — URINALYSIS W/RFL TO MICROSCOPIC
Bilirubin, UA: NEGATIVE
Blood, UA: NEGATIVE
Glucose, UA: NEGATIVE mg/dL
Ketones, UA: NEGATIVE mg/dL
Nitrite, UA: NEGATIVE
Protein, UA: NEGATIVE mg/dL
RBC, UA: 3 /HPF (ref 0–3)
Specific Gravity, UA: 1.011 (ref 1.005–1.035)
Squam Epithel, UA: 9 /HPF (ref 0–5)
Urobilinogen, UA: <2 mg/dL (ref 0.2–1.9)
WBC, UA: 11 /HPF (ref 0–5)
pH, UA: 6 (ref 5.0–8.0)

## 2022-09-16 LAB — DIFFERENTIAL
Basophils Absolute: 58 /uL (ref 0–200)
Basophils Relative: 0.8 % (ref 0.0–1.0)
Eosinophils Absolute: 144 /uL (ref 15–500)
Eosinophils Relative: 2 % (ref 0.0–8.0)
Lymphocytes Absolute: 1944 /uL (ref 850–3900)
Lymphocytes Relative: 27 % (ref 15.0–45.0)
Monocytes Absolute: 583 /uL (ref 200–950)
Monocytes Relative: 8.1 % (ref 0.0–12.0)
Neutrophils Absolute: 4471 /uL (ref 1500–7800)
Neutrophils Relative: 62.1 % (ref 40.0–80.0)
nRBC: 0 /100 WBC (ref 0–0)

## 2022-09-16 LAB — MAGNESIUM: Magnesium: 1.6 mg/dL (ref 1.5–2.5)

## 2022-09-16 LAB — CA 125: CA 125: 7.5 U/mL (ref 5.5–35.0)

## 2022-09-16 MED ORDER — albuterol (PROVENTIL) 90 mcg/actuation inhaler 1-2 puff
90 | RESPIRATORY_TRACT | PRN
Start: 2022-09-16 — End: 2022-09-16

## 2022-09-16 MED ORDER — aprepitant (CINVANTI) IV emulsion 130 mg
7.2 | Freq: Once | INTRAVENOUS | Status: AC
Start: 2022-09-16 — End: 2022-09-16
  Administered 2022-09-16: 17:00:00 130 mg via INTRAVENOUS

## 2022-09-16 MED ORDER — sodium chloride 0.9% flush 10 mL
Freq: Every day | INTRAMUSCULAR | PRN
Start: 2022-09-16 — End: 2022-09-16

## 2022-09-16 MED ORDER — sodium chloride 0.9 % infusion
Freq: Every day | INTRAVENOUS | PRN
Start: 2022-09-16 — End: 2022-09-16

## 2022-09-16 MED ORDER — CARBOplatin (PARAPLATIN) 320 mg in dextrose 5% in water (D5W) 250 mL chemo infusion
10 | Freq: Once | INTRAVENOUS | Status: AC
Start: 2022-09-16 — End: 2022-09-16
  Administered 2022-09-16: 18:00:00 320 mg via INTRAVENOUS

## 2022-09-16 MED ORDER — EPINEPHrine injection 0.3 mg
1 | Freq: Every day | INTRAMUSCULAR | PRN
Start: 2022-09-16 — End: 2022-09-16

## 2022-09-16 MED ORDER — heparin lock flush 100 unit/mL syringe 500 Units
100 | Freq: Every day | INTRAVENOUS | PRN
Start: 2022-09-16 — End: 2022-09-16

## 2022-09-16 MED ORDER — diphenhydrAMINE (BENADRYL) injection 25 mg
50 | Freq: Every day | INTRAMUSCULAR | PRN
Start: 2022-09-16 — End: 2022-09-16

## 2022-09-16 MED ORDER — bevacizumab (AVASTIN) 850 mg in sodium chloride 0.9 % 100 mL chemo infusion
25 | Freq: Once | INTRAVENOUS | Status: AC
Start: 2022-09-16 — End: 2022-09-16
  Administered 2022-09-16: 19:00:00 850 mg/kg via INTRAVENOUS

## 2022-09-16 MED ORDER — LORazepam (ATIVAN) tablet 0.5 mg
0.5 | Freq: Four times a day (QID) | ORAL | PRN
Start: 2022-09-16 — End: 2022-09-16

## 2022-09-16 MED ORDER — palonosetron (ALOXI) injection 0.25 mg
0.25 | Freq: Once | INTRAVENOUS | Status: AC
Start: 2022-09-16 — End: 2022-09-16
  Administered 2022-09-16: 17:00:00 0.25 mg via INTRAVENOUS

## 2022-09-16 MED ORDER — hydrocortisone sod succ (PF) (SOLU-CORTEF) injection 100 mg
100 | Freq: Every day | INTRAMUSCULAR | PRN
Start: 2022-09-16 — End: 2022-09-16

## 2022-09-16 MED ORDER — proCHLORPERazine (COMPAZINE) tablet 10 mg
5 | Freq: Four times a day (QID) | ORAL | PRN
Start: 2022-09-16 — End: 2022-09-16

## 2022-09-16 MED ORDER — gemcitabine (GEMZAR) 1,600 mg in sodium chloride 0.9 % 100 mL chemo infusion
200 | Freq: Once | INTRAVENOUS | Status: AC
Start: 2022-09-16 — End: 2022-09-16
  Administered 2022-09-16: 17:00:00 1600 mg/m2 via INTRAVENOUS

## 2022-09-16 MED ORDER — dexAMETHasone (DECADRON) tablet 12 mg
4 | Freq: Once | ORAL | Status: AC
Start: 2022-09-16 — End: 2022-09-16
  Administered 2022-09-16: 17:00:00 12 mg via ORAL

## 2022-09-16 MED FILL — GEMCITABINE 200 MG INTRAVENOUS SOLUTION: 200 200 mg | INTRAVENOUS | Qty: 42.08

## 2022-09-16 MED FILL — AVASTIN 25 MG/ML INTRAVENOUS SOLUTION: 25 25 mg/mL | INTRAVENOUS | Qty: 34

## 2022-09-16 MED FILL — DEXAMETHASONE 4 MG TABLET: 4 4 MG | ORAL | Qty: 3

## 2022-09-16 MED FILL — CARBOPLATIN 10 MG/ML INTRAVENOUS SOLUTION: 10 10 mg/mL | INTRAVENOUS | Qty: 32

## 2022-09-16 MED FILL — CINVANTI 7.2 MG/ML INTRAVENOUS EMULSION: 7.2 7.2 mg/mL | INTRAVENOUS | Qty: 18

## 2022-09-16 MED FILL — PALONOSETRON 0.25 MG/5 ML INTRAVENOUS SOLUTION: 0.25 0.25 mg/5 mL | INTRAVENOUS | Qty: 5

## 2022-09-16 NOTE — Unmapped (Signed)
Outpatient Social Work Consultation:    Referred by: Ezra Sites, RN    Reason for Referral: New treatment start; transportation concerns    Biopsychosocial Assessment    Presenting Problem:  Kelsey Sullivan is a 80 y.o. Married White or Caucasian female being seen by Dr. [name] for recurrent ovarian cancer.    Current Living Arrangements  Patient is currently living with her spouse and daughter while here in [place].  Patient and spouse usually live in [place]. Patient states she will be staying in [place] for treatment.    Environment  No concerns noted    Support System  Daughter that they live with and daughter in [place].    Support Person  Extended Emergency Contact Information  Primary Emergency Contact: Spears,Daphne  Address: no address given   [place]  Home Phone: [phone]  Mobile Phone: [phone]  Relation: Daughter  Preferred language: English  Interpreter needed? No  Secondary Emergency Contact: Engellend,Fred  Address: no address given   [place]  Home Phone: [phone]  Mobile Phone: [phone]  Relation: Friend  Preferred language: English  Interpreter needed? No    Advanced Directives  Patient states she has advanced directives but they are at her home in [place].    Barriers to Care Related to Living Situation  No barriers noted    Cultural/Spiritual/Language Barriers   No barriers identified     Mental Health:   Patient states she has no concerns.        11/15/2020 08/21/2022   Patient Health Questionnaire (PHQ-9)   Little interest or pleasure in doing things 0 0   Feeling down, depressed, or hopeless 0 0       Chemical Dependence  Per chart, patient reports that she has been smoking cigarettes. She has never used smokeless tobacco. She reports current alcohol use of about 14.0 standard drinks per week. She reports that she does not use drugs.     Financial  Patient does not express financial concerns    Vocational  Patient retired    Psychiatric nurse to Care  No legal issues    Education/Literacy  No Youth worker own vehicle. Concerned about transportation on treatment days. She states her daughter can likely bring her but she will need a ride home.    Level of Functioning/Activities of Daily Living  ADL List:   Independent    Durable Medical/Adaptive Equipment  No equipment    Identified Need(s) and Intervention(s)    Needs: Adjustment to Illness and Transportation    Interventions:Assessment and Education/Information    Clinical Impressions    SW met with patient in infusion suite. She is here for her first treatment. Patient states she was referred to [facility] previously and she has spoke to them. She will call to set up transportation once she has her treatment schedule. She states her husband is dependent on her due to memory issues. She states he doesn't drive but he is independent with his ADLs. She states he is safe to stay home alone during her treatments. Patient and spouse usually live in [place] but they are currently staying with their daughter here in [place]. Patient states they will stay with their daughter throughout her treatment. Patient explains this is a recurrent cancer. She will be receiving 6 cycles of treatment 2 weeks on; 1 week off.    SW provided and explained gynecological support resources including transportation, counseling, and support groups.  SW provided contact information for [facility] and encouraged patient to call and schedule transportation once she knows what rides she will need. SW explained that [facility] requires two business days notice in order to arrange transportation. Patient voiced understanding. SW also provided SW contact information and encouraged patient to reach out if additional needs arise.    Resource list provided:  GYNECOLOGICAL CANCER SUPPORT RESOURCES    [facility] [employer]  Social workers provide support and other services which can help reduce  stress for you and your family through your cancer journey.  Marlene BastJessica Inocencia Murtaugh MSW, [place], [phone]  [email or web address]     Amy Brausch, MSW, LSW, [phone]                      [email or web address]    [employer]  [employer] provides healthy meals for the entire household that are ready in minutes, rides to and from appointments and treatments related to your diagnosis, housecleaning essentials including natural cleaning supplies and a lightweight vacuum as well as virtual and in-person peer support and education. All of our services are provided free of charge and are designed to allow our clients and their families to simply focus on the fight. Will need referral from Child psychotherapistocial worker or MD office.  [phone]      https://www.[employer].org/    American Cancer Society Helpline  Information, day-to-day help, emotional support, programs, services, and resources to help you on your journey. Cancer Information Specialists are available 24/7  [phone]  cancer.org     Cancer Support Community  Dealing with the stress of cancer for you or a loved one is overwhelming - emotionally and socially, but Cancer Support Community of [place] is taking strides to make sure that no one faces cancer alone by offering free support and services to improve the quality of life and survivorship.  [phone]  [employer].gl[email or web address]    Cancer Family Care  Cancer Family Care offers a comprehensive array of programs and services that provides support to people coping with cancer including counseling, free massages, free healing touch and free wigs.  [phone]  [employer].[email or web address]/     Cancercare   Women's Cancers Program  Our Women's Cancers Program provides specialized services and resources to help. Our professional oncology social workers provide practical and emotional support.    [phone]  https://www.cancercare.org/women    Follow Up  Plan    Patient is aware of options and participated in decision making/plan.    Plan communicated to Ezra SitesJennifer Johnson, RN.    Future Appointments   Date Time Provider Department Center   09/23/2022  9:30 AM [facility]ION NURSE 3 [facility]   10/07/2022 11:00 AM Mikle Boswortharoline Craig Billingsley, MD [facility]   10/07/2022 11:00 AM INFUSION NURSE 4 [facility]   10/14/2022 10:30 AM INFUSION NURSE 4 [facility]     SW will remain available as needs arise.    Marlene BastJessica Tishara Pizano, MSW, [place]LSW   [phone]

## 2022-09-16 NOTE — Unmapped (Signed)
Pharmacy Chemotherapy Education    Kelsey Sullivan is a 81 y.o. female scheduled for chemotherapy with carboplatin, gemcitabine, and bevacizumab for the treatment of ovarian cancer.  Patient's primary oncologist is Dr. [name].  Education was provided on 09/16/2022 to the patient.     History of Present Illness:   Patient presents in good spirits today. States she does not remember any specific adverse effect from her last chemotherapy treatment. Only that she felt bad    Past Medical History:   Past Medical History:   Diagnosis Date    Bowel obstruction (CMS-HCC) 2018    Ovarian cancer (CMS-HCC) 2014    Thyroid disease        Disease:   Cancer Staging   Malignant neoplasm of ovary (CMS-HCC)  Staging form: Ovary, Fallopian Tube, And Primary Peritoneal Carcinoma, AJCC 8th Edition  - Clinical: FIGO Stage IIIC - Signed by Mikle Bosworth, MD on 04/01/2018      History of Treatment:  - Completed 6 cycles of chemotherapy with Carboplatin and Paclitaxel-weekly (C6D1 was 08/27/18).   - She initiated maintenance rubraca 11/2018 but ultimately elected to stop in 02/2019 due to side effects.     Chemotherapy doses and schedule:   CARBOplatin AUC 4 /gemcitabine / bevacizumab, GynOnc (21 day cycle)  Aghajanian C et al. (OCEANS). J Clin Oncol. 2012 Jun 10;30(17):2039-45. doi: 10.1200/JCO.2012.42.0505. Epub 2012 Apr 23.    Regimen:   CARBOplatin AUC 4 IV over 30 minutes, day 1  Gemcitabine 1,000 mg/m2 IV over 30 minutes, days 1, 8  Bevacizumab 15 mg/kg IV over 30 min, day 1    Antiemesis regimen:  Palonosetron 0.25 mg IV, day 1  Aprepitant 130 mg IVP, day 1  Ondansetron 8 mg PO/IV once, day 8  Dexamethasone 12 mg PO day 1, 8 mg PO daily on days 2,3,4  Prochlorperazine 10 mg PO q6h PRN nausea/vomiting    Medication Reconciliation and Allergies were verified with the patient and are as follows:   Allergies   Allergen Reactions    Nsaids (Non-Steroidal Anti-Inflammatory Drug)      Stomach ache     Sulfamethoxazole-Trimethoprim Other (See Comments)     Hallucinations, anxiety  Other reaction(s): Unknown  loopy  hallucinations    Celecoxib Nausea And Vomiting    Metoclopramide Other (See Comments)     Does not remember       Prior to Admission medications    Medication Sig Start Date End Date Taking? Authorizing Provider   cholecalciferol, vitamin D3, (VITAMIN D3) 1000 units tablet Take 1 tablet (1,000 Units total) by mouth daily. 11/18/18  Yes Mikle Bosworth, MD   dexAMETHasone (DECADRON) 4 MG tablet Take 8 mg by mouth daily with food on day 2, 3, and 4 of chemotherapy cycle 09/15/22  Yes Mikle Bosworth, MD   GLUTATHIONE-L ORAL Take 1 teaspoonful by mouth daily.   Yes Historical Provider, MD   IODINE ORAL Take 50 mg/day by mouth.   Yes Historical Provider, MD   Lactobac no.41/Bifidobact no.7 (PROBIOTIC-10 ORAL) Take 1 capsule by mouth daily. [employer] brand   Yes Historical Provider, MD   levothyroxine (SYNTHROID) 75 MCG tablet Take 1 tablet (75 mcg total) by mouth every morning before breakfast.   Yes Historical Provider, MD   omeprazole (PRILOSEC OTC) 20 MG tablet Take 1 tablet (20 mg total) by mouth every morning before breakfast. 20mg  twice a day   Yes Historical Provider, MD   traMADoL (ULTRAM) 50 mg tablet Take  1 tablet (50 mg total) by mouth every 6 hours as needed for Pain for up to 8 doses. 09/15/22 09/22/22 Yes Oliver Hum, MD   ubidecarenone (COENZYME Q10 ORAL) Take 1 tablet by mouth daily.   Yes Historical Provider, MD   UNABLE TO FIND Take 30 drops by mouth daily. Multimineral   Yes Historical Provider, MD   UNABLE TO FIND Take 20 drops by mouth daily. Med Name: Lurena Joiner Drops   Yes Historical Provider, MD   UNABLE TO FIND Take 2 capsules by mouth daily. Med Name: Body Bio   Yes Historical Provider, MD   vit b complex w-b 12 tablet Take 1 tablet by mouth daily.   Yes Historical Provider, MD   ZINC CITRATE ORAL Take 1 tablet by mouth daily.   Yes Historical  Provider, MD   proCHLORPERazine (COMPAZINE) 10 MG tablet Take 1 tablet (10 mg total) by mouth every 6 hours as needed (For nausea and vomiting). 09/15/22 09/16/22  Mikle Bosworth, MD       The following topics and adverse events were reviewed with the patient/caregiver:   Please check all topics that are applicable, and list any additional topics not mentioned that were covered  Impact on quality of life (importance of prevention, ensuring communication of needs, etc)  Team contacts for both urgent and non-urgent issues, including number for nights and weekends  When to call for assistance vs when to present to the emergency room.   Low blood counts (WBC, Platelets, and RBC; risk for life-threatening infection, bleeding, fatigue)  Febrile neutropenia, monitoring for fever, constitutional symptoms  Nausea/vomiting (use of preventative and rescue medications)  Damage to GI tract (mouth/esophageal sores, diarrhea and/or constipation, colitis, pancreatitis)  Rash and other toxicities to skin, nails    It was reiterated these are the most common side effects and the list may not be all-inclusive. Side effects may be severe, up to and including death.  Patient stated understanding of the chemotherapy side effects and all of the questions from the patient and/or family members were answered.      Possible Barriers to Education/Adherence Include:   None identified    Assessment of Understanding:   No caregiver present    Patient's home medications relating to chemotherapy have been picked up from the pharmacy    [facility] - [place], [place] - [address] AT [facility] [address]  [address]  [place] [address]  Phone: [phone] Fax: [phone]    [facility]  [address]  [address]  [place] [address]  Phone: [phone] Fax: [phone] Alternate Fax: [phone]    [id number] - [place], [place] - [address] AT [address]  [address]  [place] [address]  Phone: [phone] Fax: [phone]  .   These medications include: dexamethasone, prochlorperazine    Pretesting and Access:   IV access: port    Materials Given:   Chemotherapy calendar outlining antiemetic regimen  [place] Chemotherapy Tip sheet  IV/PO Hazardous Medication handling sheet    Other Issues:   None noted    Thank you for the opportunity to participate in the care of [name].  Pharmacy will continue to follow.    Hall Busing, PharmD, BCOP  Oncology Clinical Pharmacy Specialist  [facility] [facility]  Office Phone: [phone]; [phone]    - Chemotherapy and You contains common adverse effects associated with chemotherapy, prevention strategies,  and ways to treat.   - Eating Hints contains additional strategies for dietary management during chemotherapy.  - Chemocare and FDA medication guides discuss common adverse events for each agent, as well as prevention strategies  - Chemotherapy Tip Sheet includes the following information: Contact numbers for business hours (nurse triage line) and non-business hours (hospital operator), examples of when to call for adverse effects vs. when to seek emergency medical attention, infection prevention, mouth care, skin care, food safety, pet safety  - IV/PO Hazardous Medication handling sheet discusses strategies for body fluid containment to prevent possible exposure to others. Topics discussed include duration of precautions, toileting, physical contact, laundry, eating utensils, body fluid handling.

## 2022-09-16 NOTE — Unmapped (Addendum)
History of Present Illness  Kelsey Sullivan is a 81 y.o. 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 She declined adjuvant therapy. In 01/2018, she experienced mid abdominal pain, and seen by PCP and ED, with CT of abdomen and pelvis showing interval development of necrotic retroperitoneal lymphadenopathy with largest node measuring 4.9  4.2 x 4.8 cm. Her CA 125 on 02/18/18 was 197.8.  She underwent a ct-guided nodal biopsy on 02/26/18, with pathology confirming recurrent disease.  She completed 6 cycles of chemotherapy with Carboplatin and Paclitaxel-weekly (C6D1 was 08/27/18). CA 125 was 8.7 at C6.     She underwent CT CAP on 09/22/2018 revealed ned in chest and interval decrease in size of a left periaortic necrotic lymph node from 1.8 to 1.6 cm. 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 PET on 10/15/18 shows reassuring intraabdominal findings with resolution of the previous hypermetabolic para-aortic lymph nodes and hepatic dome lesions, but did note uptake within the thyroid. She underwent thyroid US on 10/20/18 with a 1.2 cm irregular hypoechoic nodule in the left thyroid lobe is suspicious. Recommended FNA for further evaluation, but she did not get this done.    She initiated maintenance rubraca 11/2018 but ultimately elected to stop in 02/2019 due to side effects.     She was seen for  surveillance 08/21/2022 with an increase in her CA 125 to 8.5 - CT CAP and PET were checked and consistent with platinum sensitive recurrence #2.   CT CAP on 08/25/2022 noted 2.6 cm lesion along the dome of the right hepatic lobe may represent a peritoneal lesion along the undersurface of the diaphragm rather than a true hepatic parenchymal lesion. 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   PET 08/29/2022 noted two FDG avid foci at the superior surface of right hepatic lobe, favored to be peritoneal nodules along the undersurface of right hemidiaphragm, concerning for metastases.     She is initiating tx on Carbo/gem/bev, C1 09/16/22.    There are no changes to the cancer history HPI above.       Pt here for PCE.  Kelsey Sullivan is doing well.   She denies any abdominal/pelvic pain.   She denies any bloating or fullness.  She reports normal bowel/bladder habits.  She denies any GU concerns. Denies any dysuria, frequency, hematuria, flank pain or fevers.  She denies any vaginal bleeding or discharge.   She denies neuropathy.   She reports no leg swelling.   Her appetite is good, and reports no nausea or vomiting.   Has some post nasal drainage currently.    There are no changes to the cancer history HPI above.       Oncology History Overview Note   Stage IIIC high  grade serous fallopian tube cancer + STIC (mets to left tube, ovary, omentum, cul de sac peritoneum 12/2012, plat sensitive recurrence 03/2018    Disposition: RTC 10/11 in [facility] for D1C1-carbo/gem/bev  Port referral to surg onc  Current disease status: Neg for CS mutation, no VUS, lifetime remaining breast cancer risk -3.4%  Genetics: negative for mutations, breast cancer lifetime risk 3.4%  Survivorship plan: pending completion of treatment        Malignant neoplasm of ovary (CMS-HCC)   12/28/2012 Pathology    R ovary and fallopain tube-  Serous adenocarcinoma, high grade,  arising from the fallopian tube, infarcted consistent with torsion. Serous carcionma in situ.    Omentum-biopsy for frozen section-metastatic adenocarcionma.  Residual right IP ligament-benign smooth muscle and fibroadiopse tissue containing thick walled blood vessels.    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  Anterior cul de sac biopsy-benign fibroadipose tissue.   Posterior cul de sac biopsy-metastatic adenocarcinoma.  Right pelvic side will biopsy- benign fibroadipose tissue.  Left pelvic side wall, right gutter and Left gutter-benign fibroadipose tissue.  Omentectomy- metastatic adenocarcinoma  LND-negative     12/28/2012 Surgery    BSO, omentectomy, LND     12/28/2012 Initial Diagnosis    Malignant neoplasm of both ovaries (CMS Dx)     01/08/2013 - 01/08/2013 Systemic Therapy (Oral and IV)    Patient refused chemotherapy and did alternative treatment including a raw diet for 1 year.       04/06/2017 Imaging    CT [name]- mild small bowel obstruction with transition point in the left pelvis adjacent to the anterior abdominal wall. Low density in the common femoral veins bilaterally thought likely to represent flow artificat, may consider follow up with lomer extremity Doppler to ensure there is no underlying deep venous thrombosis.     01/29/2018 Imaging    CT [name]-  Interval development of necrotic retroperitoneal lymphadenopathy with largest node measuring 4.9  4.2 x 4.8 cm. Origin is indeterminate.  Options would include CT directed biopsy as well as PET/CT.     02/18/2018 Tumor Markers    CA 125:  197.8     02/26/2018 Biopsy    CT guided Left retroperitoneal node biopsy- focally involved by non-small cell malignancy- additional stains are diagnostic of non-small cell carcionoma and supportive of involve45ment by the patients reported previous known ovarian malignancy.     03/04/2018 Imaging    PET/CT-  FDG avid retroperitoneal lymphadenopathy as described above and an FDG  avid lesion over the dome of the liver most likely a peritoneal implant rather than a liver metastasis.  No other focal areas of FD avid malignancy are found. Specifically, no lymph node activity is found outside of the retroperitoneum and there is no evidence for FDG avid pulmonary, skeletal or hepatic parenchymal metastasis.     04/01/2018 Cancer Staged     Cancer Staging   Malignant neoplasm of ovary (CMS-HCC)  Staging form: Ovary, Fallopian Tube, And Primary Peritoneal Carcinoma, AJCC 8th Edition  - Clinical: FIGO Stage IIIC - Signed by Mikle Bosworth, MD on 04/01/2018         04/12/2018 Procedure    IR port placed       04/15/2018 Genetic Testing    Myraid Testing-Neg     04/16/2018 - 08/27/2018 Systemic Therapy (Oral and IV)    Treatment Summary   Treatment goal Disease control   Plan Name OP Gyn CARBOplatin AUC 6 / PACLitaxel  Weekly   Status Active   Start Date 04/16/2018   End Date 09/10/2018 (Planned)   Provider Mikle Bosworth, MD   Chemotherapy PACLitaxel (TAXOL) 120 mg in sodium chloride 0.9 % 250 mL chemo infusion, 80 mg/m2 = 120 mg, Intravenous, Once, 4 of 6 cycles    CARBOplatin (PARAPLATIN) 480 mg in dextrose 5% in water (D5W) 250 mL chemo infusion, 480 mg, Intravenous, Once, 4 of 6 cycles  Dose modification: 497 mg (original dose 484.8 mg, Cycle 2, Reason: Other (See Comments))         04/16/2018:   C1 weekly taxol 80 mg/m2 D1,8,15, Carboplatin AUC 6 D1 q 21 days. PCEs on thurs, txt on fridays. EXAMS ON EVEN CYCLES     CA 125: 523.4     Genetic testing: Neg for CS mutation, no VUS, lifetime remaining breast cancer risk -3.4%    05/07/2018:   C2 weekly taxol 80 mg/m2 D1,8,15, Carboplatin AUC 6 D1 q 21 days. PCEs on thurs, txt on fridays. EXAMS ON EVEN CYCLES     CA 125: 162.2    05/20/2018:   Early PCE due to scheduling, due 6/21    05/28/2018  Delay C3 due to low ANC     CA 125: 25    06/04/2018  C3 weekly taxol 80 mg/m2 D1,8,15, Carboplatin AUC 6 D1 q 21 days. EXAMS ON EVEN CYCLES.      CA  125    06/25/2018:   HOLD chemo due to low anc (1047)- defer to next week, and change to q 28 days cycle to build in an off week to allow for counts to recover     CA 125: 10.6    07/02/2018:   C4 weekly taxol 80 mg/m2 D1,8,15, Carboplatin AUC 6 D1 q 28 days. EXAMS ON EVEN CYCLES.  Review her genetics testing results      CA 125 7.2    07/30/2018:  C5 weekly taxol 80 mg/m2 D1,8,15, Carboplatin AUC 6 D1 q 28 days. Neulasta Day 16      EXAMS ON EVEN CYCLES.      CA 125: 11.4    08/27/2018:   C6 weekly taxol 80 mg/m2 D1,8,15, Carboplatin AUC 6 D1 q 28 days. Neulasta Day 16. Order Ct CAP. RTC 10/11 (3 weeks)     EXAMS ON EVEN CYCLES. Discussed option of maintenance parp I- pt not decided (doesn't want to feel tired anymore)     CA 125: 8.7       05/28/2018 Tumor Markers    Tumor markers tested:  CA 125.  Results:   25.     06/25/2018 Tumor Markers    Tumor markers tested:  CA 125.  Results:   10.6.     07/02/2018 Tumor Markers    Tumor markers tested:  CA 125.  Results:   7.2.     07/07/2018 Hospital Admission    Admit date: 7/31 - 07/13/18  Admission diagnosis: Partial SBO  Additional comments: On 7/31 she experienced abdominal pain at home that was sharp on her LLQ and had significant diarrhea and one episode of emesis. She came to be evaluated at the [facility] clinic. She has a history of prior small bowel obstruction. She was give IL NS, 2mg  morphine IV, and 4mg  Zofran IV in clinic and admitted for further observation. The abdominal XR showed dilated bowel loops, concerning for obstruction. CT abdomen/pelvis showed dilation of the small bowel with transition point seen in the anterior mid abdomen near  the midline.     07/07/2018 Imaging    Imaging Completed:  X-ray of  abdomen  Result: Nonspecific bowel gas pattern. The presence of multiple air-fluid levels in small bowel raises question of early or partial small bowel obstruction.     07/07/2018 Imaging    CTAP:Small bowel obstruction with transition point in the anterior mid  abdomen near the midline. Small amount of free fluid in the abdomen, which is nonspecific. 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  Peritoneum: Small amount of free fluid in the abdomen. A para-aortic density with central low attenuation measures 2.5 x 2 cm and is significantly decreased in size from prior.       07/30/2018 Tumor Markers    Tumor markers tested:  CA 125.  Results:   11.4.     08/27/2018 Tumor Markers    Tumor markers tested:  CA 125.  Results:   8.7.     09/20/2018 Procedure    Port removed: While the skin surface appeared inflamed, there was no evidence of infection in the port reservoir.       09/22/2018 Imaging    CT Chest-No evidence of thoracic metastatic disease.  Suspected small seroma in the base of the right neck.  Necrotic metastatic lesion could also have this appearance but considered less likely.    [name]- Interval decrease in size of a left periaortic necrotic lymph node. No evidence of new metastatic disease.     10/15/2018 Imaging    Imaging Completed:  PET scan of  whole body  Result: Resolution of the previous hypermetabolic para-aortic lymph nodes and hepatic dome lesions.  Hypermetabolic focus within the left thyroid lobe.  Consider further evaluation with ultrasound, if clinically indicated.       10/19/2018 Imaging    Imaging Completed:  Ultrasound of  neck  Result: 1.2 cm irregular hypoechoic nodule in the left thyroid lobe is suspicious. Recommend FNA for further evaluation.  1.0 cm nodule right thyroid lobe containing macrocalcification is indeterminate. Recommend follow-up thyroid ultrasound in one year to evaluate for stability.     10/21/2018 Tumor Markers    Tumor markers tested:  CA 125.  Results:   4.8.     11/12/2018 Tumor Markers    Tumor markers tested:  CA 125  Results:   5.5     11/12/2018 - 12/03/2018 Systemic Therapy (Oral and IV)    11/12/2018: C1 rubraca 600 mg po BID. RTC 2 weeks for labs  (CBC, CMP, 4 weeks for PCE #2). Pt plans to go to [place] and may leave before PCE and re-est care at [facility] (Dr. Gerilyn Pilgrim). Referral to ENT placed (pt cancelled appt for 11/22) and FNA was prev ordered for thyroid nodule- but has not been done.        03/02/2019 Imaging    CT Scan from [facility]    No evidence for recurrent or new neoplasm. Increased colonic stool could indicate constipation. Postoperative changes     06/13/2019 Tumor Markers    Tumor markers tested: CA 125 Results:   5.0.     09/15/2019 Tumor Markers    CA 125-6.1     05/31/2020 Tumor Markers    Ca 125-5.9     08/16/2020 Tumor Markers    CA 125-6.2     02/06/2021 Tumor Markers    CA 125-4.6     08/25/2022 Imaging    CT a/p: 2.6 cm lesion  along the dome of the right hepatic lobe may represent a peritoneal lesion along the undersurface of the diaphragm rather than a true hepatic parenchymal lesion. 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     CT chest: No evidence of intrathoracic metastatic disease.      08/29/2022 Imaging    PET: 1.  Two FDG avid foci at the superior surface of right hepatic lobe, favored to be peritoneal nodules along the undersurface of right hemidiaphragm, concerning for metastases.      09/16/2022 -  Systemic Therapy (Oral and IV)    Treatment Summary   Treatment goal Disease control   Plan Name OP Gyn CARBOplatin AUC 4 / Gemcitabine / Bevacizumab   Status Active   Start Date 09/16/2022 (Planned)   End Date 11/25/2022 (Planned)   Provider Mikle Boswortharoline Craig Jaelon Gatley, MD   Chemotherapy bevacizumab (AVASTIN) 850 mg in sodium chloride 0.9 % 100 mL chemo infusion, 15 mg/kg, Intravenous, Once, 0 of 4 cycles  gemcitabine (GEMZAR) 1,600 mg in sodium chloride 0.9 % 100 mL chemo infusion, 1,000 mg/m2, Intravenous, Once, 0 of 4 cycles                  Past Medical History  She  has a past medical history of Bowel obstruction (CMS-HCC) (2018),  Ovarian cancer (CMS-HCC) (2014), and Thyroid disease.  Past Medical History:   Diagnosis Date    Bowel obstruction (CMS-HCC) 2018    Ovarian cancer (CMS-HCC) 2014    Thyroid disease        Past Surgical History  Past Surgical History:   Procedure Laterality Date    ABDOMINOPLASTY  1999    BLEPHAROPLASTY  2000    DILATION AND CURETTAGE OF UTERUS  1979    ELBOW SURGERY  1990    EYE SURGERY  1994    INSERTION CATHETER VASCULAR ACCESS N/A 09/15/2022    Procedure: INSERTION POWER PORT A CATH;  Surgeon: Garrel RidgelGregory C Wilson, MD;  Location: [facility] OR;  Service: General;  Laterality: N/A;  Left Subclavian    KNEE ARTHROPLASTY Right     ROTATOR CUFF REPAIR      TRANSUMBILICAL AUGMENTATION MAMMAPLASTY      VAGINAL HYSTERECTOMY  1979       Family History  History reviewed. No pertinent family history.    Family cancer history for ovarian, uterine and colon cancer is negative other than above.       Social History  Social History     Socioeconomic History    Marital status: Married     Spouse name: None    Number of children: None    Years of education: None    Highest education level: None   Occupational History    None   Tobacco Use    Smoking status: Some Days     Packs/day: 0.00     Types: Cigarettes    Smokeless tobacco: Never    Tobacco comments:     Smokes 1 pack every three days    Vaping Use    Vaping Use: Never used   Substance and Sexual Activity    Alcohol use: Yes     Alcohol/week: 14.0 standard drinks     Types: 14 Shots of liquor per week    Drug use: No    Sexual activity: Not Currently   Other Topics Concern    Caffeine Use Yes    Occupational Exposure No  Exercise Yes    Seat Belt Yes   Social History Narrative    Mammogram-6 years ago and it was normal. Pt stated she isn't getting them anymore.      Colonoscopy-10 years ago        Pt reports she can lie flat in bed without SOB.    Pt states she can walk a flight of stairs and city block without chest pain and SOB>       Social Determinants of Health     Financial  Resource Strain: Not on file   Physical Activity: Not on file   Stress: Not on file   Social Connections: Not on file   Housing Stability: Not on file       Past OB/GYN History  G3P2  She reports menarche at age 74 and menopause at age 46-surgical.  She denies a history of STIs.  She denies a history of abnormal cervical cytology and reports her last cytologic examination she is unsure of. She has taken HRT.     Health maintenance:  Mammogram: Date 6 years ago Results normal  Colonoscopy: Date 10 years ago Results normal    Allergies  She is allergic to nsaids (non-steroidal anti-inflammatory drug), sulfamethoxazole-trimethoprim, celecoxib, and metoclopramide.    BMI  Body mass index is 21.46 kg/m.    Disease Status: No evidence of disease/remission (08/21/2022 12:27 PM)              Histories  She has a past medical history of Bowel obstruction (CMS-HCC) (2018), Ovarian cancer (CMS-HCC) (2014), and Thyroid disease.    She has a past surgical history that includes Dilation and curettage of uterus (1979); Vaginal hysterectomy (1979); Elbow surgery (1990); Eye surgery (1994); Abdominoplasty (1999); Blepharoplasty (2000); Transumbilical augmentation mammaplasty; Rotator cuff repair; Knee Arthroplasty (Right); and insertion catheter vascular access (N/A, 09/15/2022).    Her family history is not on file.    She reports that she has been smoking cigarettes. She has never used smokeless tobacco. She reports current alcohol use of about 14.0 standard drinks per week. She reports that she does not use drugs.    Allergies  Nsaids (non-steroidal anti-inflammatory drug), Sulfamethoxazole-trimethoprim, Celecoxib, and Metoclopramide    Medications  Outpatient Encounter Medications as of 09/16/2022   Medication Sig Dispense Refill    ascorbic acid, vitamin C, (VITAMIN C) 1000 MG tablet Take 1 tablet (1,000 mg total) by mouth daily.      cholecalciferol, vitamin D3, (VITAMIN D3) 1000 units tablet Take 1 tablet (1,000 Units total)  by mouth daily. 30 tablet 0    dexAMETHasone (DECADRON) 4 MG tablet Take 8 mg by mouth daily with food on day 2, 3, and 4 of chemotherapy cycle 18 tablet 4    GLUTATHIONE-L ORAL Take by mouth daily.      IODINE ORAL Take 50 mg/day by mouth.      Lactobac no.41/Bifidobact no.7 (PROBIOTIC-10 ORAL) Take 1 capsule by mouth daily. [employer] brand      levothyroxine (SYNTHROID) 75 MCG tablet Take 1 tablet (75 mcg total) by mouth every morning before breakfast.      omeprazole (PRILOSEC OTC) 20 MG tablet Take 1 tablet (20 mg total) by mouth every morning before breakfast. 20mg  twice a day      traMADoL (ULTRAM) 50 mg tablet Take 1 tablet (50 mg total) by mouth every 6 hours as needed for Pain for up to 8 doses. 8 tablet 0    UNABLE TO FIND multimineral  vit b complex w-b 12 tablet Take 1 tablet by mouth daily.      [DISCONTINUED] iodine-sodium iodide 2 % solution Apply topically if needed.      [DISCONTINUED] proCHLORPERazine (COMPAZINE) 10 MG tablet Take 1 tablet (10 mg total) by mouth every 6 hours as needed (For nausea and vomiting). 30 tablet 1    [DISCONTINUED] omeprazole (PRILOSEC) 20 MG capsule Take 1 capsule (20 mg total) by mouth daily.       Facility-Administered Encounter Medications as of 09/16/2022   Medication Dose Route Frequency Provider Last Rate Last Admin    [COMPLETED] acetaminophen (TYLENOL) tablet 975 mg  975 mg Oral On Call to OR Wayna Chalet, MD   975 mg at 09/15/22 1104    [COMPLETED] ceFAZolin (ANCEF) 2 g in sodium chloride 0.9% 100 mL ADDaptor IVPB  2 g Intravenous On Call to OR Domingo Madeira, RN   2 g at 09/15/22 1122    [DISCONTINUED] acetaminophen (TYLENOL) tablet 975 mg  975 mg Oral On Call to OR Domingo Madeira, RN        [DISCONTINUED] BUPivacaine (PF) (SENSORCAINE) 0.5 % (5 mg/mL) 10 mL in lidocaine (PF) (XYLOCAINE) 10 mg/mL (1 %) 20 mL injection    PRN Garrel Ridgel, MD   10 mL at 09/15/22 1209    [DISCONTINUED] ceFAZolin (ANCEF) 2 g in sodium chloride 0.9% 100 mL  ADDaptor IVPB  2 g Intravenous On Call to OR Domingo Madeira, RN        [DISCONTINUED] dextrose 10%-water (D10W) IV soln  12.5 g Intravenous Q15 Min PRN Domingo Madeira, RN        [DISCONTINUED] dextrose 10%-water (D10W) IV soln  25 g Intravenous Q15 Min PRN Domingo Madeira, RN        [DISCONTINUED] glucose chewable tablet 12 g  12 g Oral Q15 Min PRN Domingo Madeira, RN        [DISCONTINUED] heparin (porcine) 5,000 unit/mL 2,500 Units in sodium chloride 0.9 % 250 mL IV infusion    PRN Garrel Ridgel, MD   2,500 Units at 09/15/22 1209    [DISCONTINUED] lactated Ringers IV infusion  20 mL/hr Intravenous Continuous Domingo Madeira, RN        [DISCONTINUED] lactated Ringers IV infusion  20 mL/hr Intravenous Continuous Domingo Madeira, RN        [DISCONTINUED] lactated Ringers IV infusion   Intravenous Continuous - One Step Medications Only Wayna Chalet, MD   New Bag at 09/15/22 1116    [DISCONTINUED] lidocaine (PF) 2% (20 mg/mL) Soln 20 mg  1 mL Intradermal Once PRN Domingo Madeira, RN        [DISCONTINUED] lidocaine (PF) 20 mg/mL (2 %) Soln   Intravenous PRN - One Step Medication Only Wayna Chalet, MD   100 mg at 09/15/22 1122    [DISCONTINUED] propofol 10 mg/ml, 20mL vial (DIPRIVAN) INFUSION   Intravenous Continuous - One Step Medications Only Wayna Chalet, MD   Stopped at 09/15/22 1155    [DISCONTINUED] sterile water irrigation    PRN Garrel Ridgel, MD   500 mL at 09/15/22 1209        Review of Systemssee HPI    Vitals  Blood pressure 147/81, pulse 99, temperature 98 F (36.7 C), temperature source Skin, height 5' 4 (1.626 m), weight 125 lb (56.7 kg), SpO2 97 %.    Physical Exam   Vitals reviewed.  Constitutional: She is oriented to person, place, and  time. She appears well-developed.   HENT:   Head: Atraumatic.   Nose: Nose normal.   Neck: No thyromegaly present.   Cardiovascular: Normal rate.   Pulmonary/Chest: Effort normal.   Abdominal: Normal appearance. She exhibits no  distension.   Genitourinary:    Genitourinary Comments: def     Musculoskeletal:         General: Normal range of motion.      Cervical back: Normal range of motion.   Lymphadenopathy:     She has no cervical adenopathy.   Neurological: She is alert and oriented to person, place, and time.   Skin: Skin is warm and dry.   Psychiatric: Her behavior is normal.        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     Posterior cul de sac biopsy-metastatic adenocarcinoma.     Omentectomy- metastatic adenocarcinoma     LND-negative     Residual right IP ligament, Anterior cul de sac biopsy, right pelvic side will biopsy, left pelvic side wall, right gutter and left gutter-->benign    04/06/2017:   CT [name]- mild small bowel obstruction with transition point in the left pelvis adjacent to the anterior abdominal wall. Low density in the common femoral veins bilaterally thought likely to represent flow artificat, may consider follow up with lomer extremity Doppler to ensure there is no underlying deep venous thrombosis.    01/29/2018:   CTAP:  Interval development of necrotic retroperitoneal lymphadenopathy with largest node measuring 4.9  4.2 x 4.8 cm. Origin is indeterminate.  Options would  include CT directed biopsy as well as PET/CT.    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  No other focal areas of FD avid malignancy are found. Specifically, no lymph node activity is found outside of the retroperitoneum and there is no evidence for FDG avid pulmonary, skeletal or hepatic parenchymal metastasis.    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 No retroperitoneal, intra-abdominal, pelvic or inguinal adenopathy, no  evidence for peritoneal surface neoplastic involvement. No evidence for recurrent or new neoplasm    Cancer Staging:   Cancer Staging   Malignant neoplasm of ovary (CMS-HCC)  Staging form: Ovary, Fallopian Tube, And Primary Peritoneal Carcinoma, AJCC 8th Edition  - Clinical: FIGO Stage IIIC - Signed by Mikle Bosworth, MD on 04/01/2018      Disease Status: No evidence of disease/remission (08/21/2022 12:27 PM)               Assessment & Plan  My impression is Ms. 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 She completed 6 cycles of chemotherapy with Carboplatin and Paclitaxel-weekly (C6D1 was 08/27/18). CA 125 was 8.7 at C6.     She underwent CT CAP on 09/22/2018  revealed ned in chest and interval decrease in size of a left periaortic necrotic lymph node from 1.8 to 1.6 cm. 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     PET on 10/15/18 shows reassuring intraabdominal findings with resolution of the previous hypermetabolic para-aortic lymph nodes and hepatic dome lesions, but did note uptake within the thyroid. She underwent thyroid US on 10/20/18 with a 1.2 cm irregular hypoechoic nodule in the left thyroid lobe is suspicious. Recommend FNA for further evaluation.     She initiated maintenance rubraca 11/2018 but ultimately elected to stop in 02/2019 due to side effects.   Patient had reestarted drug at 500 mg po BID once she got down to [place]-. Was in [place] from 12/2018-05/2019. Reports her oncologist down in [place] had changed doses of Rubraca a few times, to 300mg  po BID but decided to stop drug all together back in 02/2019 2/2 to decreased QOL. She does not wish to restart drug at this time. Last CT scan 03/02/2019 in [place], no evidence of recurrent or new neoplasm.     She was seen for surveillance 08/21/2022 with an increase in her CA 125 to 8.5 - CT CAP and PET were checked and consistent with platinum sensitive recurrence #2.     CT CAP on 08/25/2022 noted 2.6 cm lesion along the dome of the right hepatic lobe may represent a peritoneal lesion along the undersurface of the diaphragm rather than a true hepatic parenchymal lesion. 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   PET 08/29/2022 noted two FDG avid foci at the superior surface of right hepatic lobe, favored to be peritoneal nodules along the undersurface of right hemidiaphragm, concerning for metastases.     We reviewed her scans and her recurrence. 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 She is is 4 years from her last platinum based chemo.  I would recommend platinum based chemotherapy again- and would recommend carboplatin, gemcitabine and bevacizumab.     The risk and potential benefits of chemotherapy as well as available alternatives have been reviewed.  The patient has verbalized clear understanding of the cancer diagnosis and the reasons for the recommended therapy.  We discussed the risks and side effects of treatment, including allergic reaction, anemia, thrombocytopenia, neutropenia, neutropenic sepsis, kidney failure, peripheral neuropathy, and even death related to chemotherapy complications.  The patient's recent clinical and laboratory data have been reviewed and the  treatment plan formulated.  Patient verbalized understanding of the plan and associated risks. Bev specific risks were reviewed- VTE, bleeding, Gi perforation, HTN, proteinuria.    At this time, she has decided to proceed with chemotherapy as recommended.      She is seen today for C1 of Carboplatin/Gemcitabine/bevacizumab.       I have reviewed her labs and she is cleared chemotherapy. There is no evidence of progressive disease or excessive toxicity today, and we will continue treatment without dose modification. We will see her back for her next cycle of treatment, and she knows to call if questions or problems arise.     09/16/2022:   C1 carboplatin AUC4, gem 800 mg/m2 (D1/8), Bev D1 q 21 days. Watch BPs- prior to start of bev today Bps elevated, may need to start in future   CA 125:      Thyroid Nodule:   Incidental finding on PET scan of hypermetabolic focus within left thyroid nodule. Ultrasound performed on 10/20/18 with 1.2 cm irregular hypoechoic nodule, appearing suspicious. Recommend FNA for further evaluation, which was ordered. Pt has not followed up to date.  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 Referral to ENT made, but patient did not see them. She followed up with her PCP and was started on Synthroid. Last TSH 5.91. Repeat TSH/T4/T3  labs wnl 11/2020     Genetics:  previously discussed her negative genetic testing results.    Pain  Pain Score: Zero (09/16/2022 10:31 AM)    She denies presence of pain.  Pain will be reassessed at next visit.           The HPI, ROS, and test results were reviewed and copied forward (with edits) from a note written by Dr. [name] on 09/04/22. I have reviewed and updated the history, physical exam, data, assessment, and plan of the note so that it reflects my evaluation and management of the patient.    Glenice Bow, CNP  Division of Gynecologic Oncology  [phone]      I saw and personally examined the patient today . I discussed the findings and therapeutic plan with the patient. I repeated, reviewed and agree with the history of present illness, past medical histories, family history, social history, medication list, and allergies as listed. The review of systems is as noted above. My physical exam confirms the findings listed above. Review of labs, pathology reports, radiograph reports, and medical records confirm the findings noted above. I agree with the assessment and plan as noted above. I have edited the note where appropriate.     I have personally performed a face to face diagnostic evaluation on this patient, seen initially by the CNP. I have personally developed the care plan as outlined in the Assessment and Plan.     This note was completely edited, written and reviewed by me and consists of information cut and pasted from the my most recent visit, my smart phrases and other Epic tools. I have personally reviewed all aspects of this note to at least include reviewing this patient's chart and problem list, updating the history, physical exam, lab and procedure results, and assessment and plan as detailed above and below. As such this visit note reflects my current evaluation and management for this patient.    This note was copied forward from the note written by me on 09/04/22. I have  reviewed and updated the history, physical exam, data, assessment and plan of the note so  that it reflects the evaluation and management of the patient on 09/16/22       Complexity: high     I spent a total of 45 minutes was spent prior to, during and after the patient encounter in counseling and/or coordination of care, documentation and counseling with patient and/or family.      Topics discussed include rec ovarian  Cancer prognosis and treatment options and management of chemo.  09/16/2022:   C1 carboplatin AUC4, gem 1000 mg/m2 (D1/8), Bev D1 q 21 days. Watch BPs- prior to start of bev today Bps elevated, may need to start in future   CA 125:        Maggie Font, MD, FACOG  Associate Professor, Obstetrics and Gynecology  Division of Gynecologic Oncology        Medical Decision Making:  The following items were considered in medical decision making:  Review / order clinical lab tests  Review / order radiology tests  Review / order other diagnostic tests/interventions

## 2022-09-16 NOTE — Unmapped (Signed)
~  Pt arrived for Day 1 Cycle 1 for Gemzar, Carbo, Bev infusion. Reviewed allergies and current medications with pt. Fall risk assessment completed. Pt is not a fall risk. Vitals signs recorded, see flowsheet.     ~Port accessed with 20 gauge 0.75 inch needle using sterile technique. Labs drawn off of port and within treatment parameters. Reviewed current symptoms with pt, see flowsheet    ~Pre medications given and medication infused per treatment plan without adverse reaction. Port access flushed with normal saline and 5 cc 100 unit/ml heparinized saline, then port deaccessed.     ~Pt given AVS. Future MD and infusion appointments in place. Pt left ambulatory.

## 2022-09-17 NOTE — Unmapped (Signed)
Anesthesia Post Note    Patient: Kelsey Sullivan    Procedure(s) Performed: Procedure(s) with comments:  INSERTION POWER PORT A CATH - Left Subclavian    Anesthesia type: MAC    Patient location: Same Day Surgery    Airway: Patent    Post pain: Adequate analgesia    Nausea / Vomiting: Absent    Post-operative Hydration Status: Adequate    Post assessment: no apparent anesthetic complications    Last Vitals:   Vitals:    09/15/22 1024 09/15/22 1207 09/15/22 1212 09/15/22 1227   BP: 152/88 168/85 160/86 (!) 172/91   Pulse: 71 73 69 67   Resp: 16 16 16 16    Temp: 98.2 F (36.8 C)      TempSrc: Oral      SpO2: 98% 98% 98% 100%   Weight: 122 lb (55.3 kg)      Height: 5' 4 (1.626 m)           Post vital signs: stable    Level of consciousness: awake    Complications:  Notable Events   Notable Event Outcome Phase Comment   None  Intraprocedure

## 2022-09-19 NOTE — Unmapped (Signed)
Patient called to see if she could switch to seeing Dr. Leonard SchwartzB in [place] instead of [place]. Advised patient I would call her back to reschedule.

## 2022-09-23 ENCOUNTER — Ambulatory Visit: Admit: 2022-09-23 | Payer: Medicare (Managed Care)

## 2022-09-23 DIAGNOSIS — C569 Malignant neoplasm of unspecified ovary: Secondary | ICD-10-CM

## 2022-09-23 LAB — DIFFERENTIAL
Basophils Absolute: 27 /uL (ref 0–200)
Basophils Relative: 1 % (ref 0.0–1.0)
Eosinophils Absolute: 41 /uL (ref 15–500)
Eosinophils Relative: 1.5 % (ref 0.0–8.0)
Lymphocytes Absolute: 1123 /uL (ref 850–3900)
Lymphocytes Relative: 41.6 % (ref 15.0–45.0)
Monocytes Absolute: 62 /uL (ref 200–950)
Monocytes Relative: 2.3 % (ref 0.0–12.0)
Neutrophils Absolute: 1447 /uL (ref 1500–7800)
Neutrophils Relative: 53.6 % (ref 40.0–80.0)
nRBC: 0 /100 WBC (ref 0–0)

## 2022-09-23 LAB — CBC
Hematocrit: 36.4 % (ref 35.0–45.0)
Hemoglobin: 12.6 g/dL (ref 11.7–15.5)
MCH: 34.5 pg (ref 27.0–33.0)
MCHC: 34.6 g/dL (ref 32.0–36.0)
MCV: 99.6 fL (ref 80.0–100.0)
MPV: 8.2 fL (ref 7.5–11.5)
Platelets: 116 10*3/uL (ref 140–400)
RBC: 3.65 10*6/uL (ref 3.80–5.10)
RDW: 12 % (ref 11.0–15.0)
WBC: 2.7 10*3/uL (ref 3.8–10.8)

## 2022-09-23 LAB — MAGNESIUM: Magnesium: 1.7 mg/dL (ref 1.5–2.5)

## 2022-09-23 MED ORDER — sodium chloride 0.9% flush 10 mL
Freq: Every day | INTRAMUSCULAR | PRN
Start: 2022-09-23 — End: 2022-09-23

## 2022-09-23 MED ORDER — dexAMETHasone (DECADRON) tablet 12 mg
4 | Freq: Once | ORAL | Status: AC
Start: 2022-09-23 — End: 2022-09-23
  Administered 2022-09-23: 16:00:00 12 mg via ORAL

## 2022-09-23 MED ORDER — LORazepam (ATIVAN) injection 1 mg
2 | Freq: Four times a day (QID) | INTRAMUSCULAR | PRN
Start: 2022-09-23 — End: 2022-09-23

## 2022-09-23 MED ORDER — sodium chloride 0.9 % infusion
Freq: Every day | INTRAVENOUS | PRN
Start: 2022-09-23 — End: 2022-09-23
  Administered 2022-09-23: 15:00:00 25 mL/h via INTRAVENOUS

## 2022-09-23 MED ORDER — heparin lock flush 100 unit/mL syringe 500 Units
100 | Freq: Every day | INTRAVENOUS | PRN
Start: 2022-09-23 — End: 2022-09-23
  Administered 2022-09-23: 17:00:00 500 [IU]

## 2022-09-23 MED ORDER — LORazepam (ATIVAN) tablet 1 mg
0.5 | Freq: Four times a day (QID) | ORAL | PRN
Start: 2022-09-23 — End: 2022-09-23

## 2022-09-23 MED ORDER — gemcitabine (GEMZAR) 1,600 mg in sodium chloride 0.9 % 100 mL chemo infusion
200 | Freq: Once | INTRAVENOUS | Status: AC
Start: 2022-09-23 — End: 2022-09-23
  Administered 2022-09-23: 16:00:00 1600 mg/m2 via INTRAVENOUS

## 2022-09-23 MED FILL — GEMCITABINE 200 MG INTRAVENOUS SOLUTION: 200 200 mg | INTRAVENOUS | Qty: 42.08

## 2022-09-23 MED FILL — LORAZEPAM 2 MG/ML INJECTION SOLUTION: 2 2 mg/mL | INTRAMUSCULAR | Qty: 1

## 2022-09-23 MED FILL — DEXAMETHASONE 4 MG TABLET: 4 4 MG | ORAL | Qty: 3

## 2022-09-23 NOTE — Unmapped (Signed)
SW met with patient in infusion suite to follow up on transportation need. Patient states she is comfortable driving to/from her treatments and she is going to transfer back to the [facility] as this is closer to her home. She had originally transferred to [facility] to be closer to her daughter's work as she did not think she would be able to drive after treatments. Patient has discussed this with her medical team. She states after today she will receive one more treatment here in [place] then will transfer to the [facility]. Patient states she has no needs at this time. SW encouraged patient to reach out if needs arise.    SW will remain available as needs arise.    Marlene Bast, MSW, [place]   [phone]

## 2022-09-23 NOTE — Unmapped (Signed)
Patient at infusion clinic for treatment. Labs reviewed and released- Port accessed per sterile procedure with blood return noted. Labs drawn and sent to lab for processing.    1022 Labs resulted, OK to prepare sent to pharmacy  1130 Pre-meds given  1200 Gemzar infusion started and completed    Treatment complete, pt tolerated well, denies any side effects at this time. Please see flowsheet for post care VS. Port checked and noted positive blood return,  flushed and heparinized, access removed with tip intact. Pt advised of chemotherapy tips and when to seek medical attention while on chemo Rx. Follow up appt verified. Pt discharged to home in baseline condition. AVS declined.

## 2022-09-24 NOTE — Unmapped (Signed)
Called and went over upcoming appointments with patient and advised her to ask for a AVS after next visit to reference. Patient verbalized understanding

## 2022-10-07 ENCOUNTER — Ambulatory Visit: Admit: 2022-10-07 | Discharge: 2022-10-07 | Payer: Medicare (Managed Care)

## 2022-10-07 ENCOUNTER — Ambulatory Visit: Admit: 2022-10-07 | Payer: Medicare (Managed Care)

## 2022-10-07 DIAGNOSIS — C569 Malignant neoplasm of unspecified ovary: Secondary | ICD-10-CM

## 2022-10-07 DIAGNOSIS — Z5111 Encounter for antineoplastic chemotherapy: Secondary | ICD-10-CM

## 2022-10-07 LAB — COMPREHENSIVE METABOLIC PANEL
ALT: 53 U/L — ABNORMAL HIGH (ref 7–52)
AST: 27 U/L (ref 13–39)
Albumin: 4.1 g/dL (ref 3.5–5.7)
Alkaline Phosphatase: 89 U/L (ref 36–125)
Anion Gap: 9 mmol/L (ref 3–16)
BUN: 10 mg/dL (ref 7–25)
CO2: 24 mmol/L (ref 21–33)
Calcium: 9.1 mg/dL (ref 8.6–10.3)
Chloride: 99 mmol/L (ref 98–110)
Creatinine: 0.7 mg/dL (ref 0.60–1.30)
EGFR: 87
Glucose: 177 mg/dL — ABNORMAL HIGH (ref 70–100)
Osmolality, Calculated: 277 mosm/kg — ABNORMAL LOW (ref 278–305)
Potassium: 4.3 mmol/L (ref 3.5–5.3)
Sodium: 132 mmol/L — ABNORMAL LOW (ref 133–146)
Total Bilirubin: 0.3 mg/dL (ref 0.0–1.5)
Total Protein: 6.7 g/dL (ref 6.4–8.9)

## 2022-10-07 LAB — URINALYSIS W/RFL TO MICROSCOPIC
Bilirubin, UA: NEGATIVE
Blood, UA: NEGATIVE
Glucose, UA: NEGATIVE mg/dL
Ketones, UA: NEGATIVE mg/dL
Leukocyte Esterase, UA: NEGATIVE
Nitrite, UA: NEGATIVE
Protein, UA: NEGATIVE mg/dL
Specific Gravity, UA: 1.009 (ref 1.005–1.035)
Urobilinogen, UA: <2 mg/dL (ref 0.2–1.9)
pH, UA: 6 (ref 5.0–8.0)

## 2022-10-07 LAB — CA 125: CA 125: 7.9 U/mL (ref 5.5–35.0)

## 2022-10-07 LAB — DIFFERENTIAL
Basophils Absolute: 15 /uL (ref 0–200)
Basophils Relative: 0.8 % (ref 0.0–1.0)
Eosinophils Absolute: 48 /uL (ref 15–500)
Eosinophils Relative: 2.5 % (ref 0.0–8.0)
Lymphocytes Absolute: 868 /uL (ref 850–3900)
Lymphocytes Relative: 45.7 % — ABNORMAL HIGH (ref 15.0–45.0)
Monocytes Absolute: 314 /uL (ref 200–950)
Monocytes Relative: 16.5 % — ABNORMAL HIGH (ref 0.0–12.0)
Neutrophils Absolute: 656 /uL — ABNORMAL LOW (ref 1500–7800)
Neutrophils Relative: 34.5 % — ABNORMAL LOW (ref 40.0–80.0)
nRBC: 0 /100{WBCs} (ref 0–0)

## 2022-10-07 LAB — CBC
Hematocrit: 31 % — ABNORMAL LOW (ref 35.0–45.0)
Hemoglobin: 10.9 g/dL — ABNORMAL LOW (ref 11.7–15.5)
MCH: 35 pg — ABNORMAL HIGH (ref 27.0–33.0)
MCHC: 35.3 g/dL (ref 32.0–36.0)
MCV: 99.2 fL (ref 80.0–100.0)
MPV: 7.4 fL — ABNORMAL LOW (ref 7.5–11.5)
Platelets: 304 10*3/uL (ref 140–400)
RBC: 3.13 10*6/uL — ABNORMAL LOW (ref 3.80–5.10)
RDW: 12.1 % (ref 11.0–15.0)
WBC: 1.9 10*3/uL — ABNORMAL LOW (ref 3.8–10.8)

## 2022-10-07 LAB — MAGNESIUM: Magnesium: 1.6 mg/dL (ref 1.5–2.5)

## 2022-10-07 MED ORDER — lisinopriL (PRINIVIL) 10 MG tablet
10 | ORAL_TABLET | Freq: Every day | ORAL | 3 refills | 30.00000 days | Status: AC
Start: 2022-10-07 — End: 2022-11-24

## 2022-10-07 NOTE — Unmapped (Signed)
History of Present Illness  Kelsey Sullivan is a 81 y.o. 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 She declined adjuvant therapy. In 01/2018, she experienced mid abdominal pain, and seen by PCP and ED, with CT of abdomen and pelvis showing interval development of necrotic retroperitoneal lymphadenopathy with largest node measuring 4.9  4.2 x 4.8 cm. Her CA 125 on 02/18/18 was 197.8.  She underwent a ct-guided nodal biopsy on 02/26/18, with pathology confirming recurrent disease.  She completed 6 cycles of chemotherapy with Carboplatin and Paclitaxel-weekly (C6D1 was 08/27/18). CA 125 was 8.7 at C6.     She underwent CT CAP on 09/22/2018 revealed ned in chest and interval decrease in size of a left periaortic necrotic lymph node from 1.8 to 1.6 cm. 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 PET on 10/15/18 shows reassuring intraabdominal findings with resolution of the previous hypermetabolic para-aortic lymph nodes and hepatic dome lesions, but did note uptake within the thyroid. She underwent thyroid US on 10/20/18 with a 1.2 cm irregular hypoechoic nodule in the left thyroid lobe is suspicious. Recommended FNA for further evaluation, but she did not get this done.    She initiated maintenance rubraca 11/2018 but ultimately elected to stop in 02/2019 due to side effects.     She was seen for surveillance  08/21/2022 with an increase in her CA 125 to 8.5 - CT CAP and PET were checked and consistent with platinum sensitive recurrence #2.   CT CAP on 08/25/2022 noted 2.6 cm lesion along the dome of the right hepatic lobe may represent a peritoneal lesion along the undersurface of the diaphragm rather than a true hepatic parenchymal lesion. 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   PET 08/29/2022 noted two FDG avid foci at the superior surface of right hepatic lobe, favored to be peritoneal nodules along the undersurface of right hemidiaphragm, concerning for metastases.     She is initiating tx on Carbo/gem/bev, C1 09/16/22.    There are no changes to the cancer history HPI above.       Pt here for PCE.  Kelsey Sullivan is doing well. Mostly had bone pain controlled with tylenol.  She denies any abdominal/pelvic pain.   She denies any bloating or fullness.  She reports normal bowel/bladder habits.  She denies any GU concerns. Denies any dysuria, frequency, hematuria, flank pain or fevers.  She denies any vaginal bleeding or discharge.   She denies neuropathy.   She reports no leg swelling.   Her appetite is good, and reports no nausea or vomiting.     There are no changes to the cancer history HPI above.       Oncology History Overview Note   Stage IIIC high grade  serous fallopian tube cancer + STIC (mets to left tube, ovary, omentum, cul de sac peritoneum 12/2012, plat sensitive recurrence 03/2018    Disposition: RTC 10/10 in [facility] for D1C1-carbo/gem/bev  1wk C1D8 gem only; 3wks C2D1 carbo/gem/bev  Port referral to surg onc  Current disease status: Neg for CS mutation, no VUS, lifetime remaining breast cancer risk -3.4%  Genetics: negative for mutations, breast cancer lifetime risk 3.4%  Survivorship plan: pending completion of treatment        Malignant neoplasm of ovary (CMS-HCC)   12/28/2012 Pathology    R ovary and fallopain tube-   Serous adenocarcinoma, high grade, arising from the fallopian tube, infarcted consistent with torsion. Serous carcionma in situ.    Omentum-biopsy for frozen section-metastatic adenocarcionma.  Residual right IP ligament-benign smooth muscle and fibroadiopse tissue containing thick walled blood vessels.    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  Anterior cul de sac biopsy-benign fibroadipose tissue.   Posterior cul de sac biopsy-metastatic adenocarcinoma.  Right pelvic side will biopsy- benign fibroadipose tissue.  Left pelvic side wall, right gutter and Left gutter-benign fibroadipose tissue.  Omentectomy- metastatic adenocarcinoma  LND-negative     12/28/2012 Surgery    BSO, omentectomy, LND     12/28/2012 Initial Diagnosis    Malignant neoplasm of both ovaries (CMS Dx)     01/08/2013 - 01/08/2013 Systemic Therapy (Oral and IV)    Patient refused chemotherapy and did alternative treatment including a raw diet for 1 year.       04/06/2017 Imaging    CT [name]- mild small bowel obstruction with transition point in the left pelvis adjacent to the anterior abdominal wall. Low density in the common femoral veins bilaterally thought likely to represent flow artificat, may consider follow up with lomer extremity Doppler to ensure there is no underlying deep venous thrombosis.     01/29/2018 Imaging    CT [name]-  Interval development of necrotic retroperitoneal lymphadenopathy with largest node measuring 4.9  4.2 x 4.8 cm. Origin is indeterminate.  Options would include CT directed biopsy as well as PET/CT.     02/18/2018 Tumor Markers    CA 125:  197.8     02/26/2018 Biopsy    CT guided Left retroperitoneal node biopsy- focally involved by non-small cell malignancy- additional stains are diagnostic of non-small cell carcionoma and supportive of involve2ment by the patients reported previous known ovarian malignancy.     03/04/2018 Imaging    PET/CT-  FDG avid retroperitoneal  lymphadenopathy as described above and an FDG avid lesion over the dome of the liver most likely a peritoneal implant rather than a liver metastasis.  No other focal areas of FD avid malignancy are found. Specifically, no lymph node activity is found outside of the retroperitoneum and there is no evidence for FDG avid pulmonary, skeletal or hepatic parenchymal metastasis.     04/01/2018 Cancer Staged     Cancer Staging   Malignant neoplasm of ovary (CMS-HCC)  Staging form: Ovary, Fallopian Tube, And Primary Peritoneal Carcinoma, AJCC 8th Edition  - Clinical: FIGO Stage IIIC - Signed by Mikle Bosworth, MD on 04/01/2018         04/12/2018 Procedure    IR port placed       04/15/2018 Genetic Testing    Myraid Testing-Neg     04/16/2018 - 08/27/2018 Systemic Therapy (Oral and IV)    Treatment Summary   Treatment goal Disease control   Plan Name  OP Gyn CARBOplatin AUC 6 / PACLitaxel Weekly   Status Active   Start Date 04/16/2018   End Date 09/10/2018 (Planned)   Provider Mikle Bosworth, MD   Chemotherapy PACLitaxel (TAXOL) 120 mg in sodium chloride 0.9 % 250 mL chemo infusion, 80 mg/m2 = 120 mg, Intravenous, Once, 4 of 6 cycles    CARBOplatin (PARAPLATIN) 480 mg in dextrose 5% in water (D5W) 250 mL chemo infusion, 480 mg, Intravenous, Once, 4 of 6 cycles  Dose modification: 497 mg (original dose 484.8 mg, Cycle 2, Reason: Other (See Comments))         04/16/2018:   C1 weekly taxol 80 mg/m2 D1,8,15, Carboplatin AUC 6 D1 q 21 days. PCEs on thurs, txt on fridays. EXAMS ON EVEN CYCLES     CA 125: 523.4     Genetic testing: Neg for CS mutation, no VUS, lifetime remaining breast cancer risk -3.4%    05/07/2018:   C2 weekly taxol 80 mg/m2 D1,8,15, Carboplatin AUC 6 D1 q 21 days. PCEs on thurs, txt on fridays. EXAMS ON EVEN CYCLES     CA 125: 162.2    05/20/2018:   Early PCE due to scheduling, due 6/21    05/28/2018  Delay C3 due to low ANC     CA 125: 25    06/04/2018  C3 weekly taxol 80 mg/m2 D1,8,15, Carboplatin AUC  6 D1 q 21 days. EXAMS ON EVEN CYCLES.      CA 125    06/25/2018:   HOLD chemo due to low anc (1047)- defer to next week, and change to q 28 days cycle to build in an off week to allow for counts to recover     CA 125: 10.6    07/02/2018:   C4 weekly taxol 80 mg/m2 D1,8,15, Carboplatin AUC 6 D1 q 28 days. EXAMS ON EVEN CYCLES.  Review her genetics testing results      CA 125 7.2    07/30/2018:  C5 weekly taxol 80 mg/m2 D1,8,15, Carboplatin AUC 6 D1 q 28 days. Neulasta Day 16      EXAMS ON EVEN CYCLES.      CA 125: 11.4    08/27/2018:   C6 weekly taxol 80 mg/m2 D1,8,15, Carboplatin AUC 6 D1 q 28 days. Neulasta Day 16. Order Ct CAP. RTC 10/11 (3 weeks)     EXAMS ON EVEN CYCLES. Discussed option of maintenance parp I- pt not decided (doesn't want to feel tired anymore)     CA 125: 8.7       05/28/2018 Tumor Markers    Tumor markers tested:  CA 125.  Results:   25.     06/25/2018 Tumor Markers    Tumor markers tested:  CA 125.  Results:   10.6.     07/02/2018 Tumor Markers    Tumor markers tested:  CA 125.  Results:   7.2.     07/07/2018 Hospital Admission    Admit date: 7/31 - 07/13/18  Admission diagnosis: Partial SBO  Additional comments: On 7/31 she experienced abdominal pain at home that was sharp on her LLQ and had significant diarrhea and one episode of emesis. She came to be evaluated at the [facility] clinic. She has a history of prior small bowel obstruction. She was give IL NS, 2mg  morphine IV, and 4mg  Zofran IV in clinic and admitted for further observation. The abdominal XR showed dilated bowel loops, concerning for obstruction. CT abdomen/pelvis showed dilation of the small bowel with transition point  seen in the anterior mid abdomen near the midline.     07/07/2018 Imaging    Imaging Completed:  X-ray of  abdomen  Result: Nonspecific bowel gas pattern. The presence of multiple air-fluid levels in small bowel raises question of early or partial small bowel obstruction.     07/07/2018 Imaging    CTAP:Small bowel  obstruction with transition point in the anterior mid abdomen near the midline. Small amount of free fluid in the abdomen, which is nonspecific. 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  Peritoneum: Small amount of free fluid in the abdomen. A para-aortic density with central low attenuation measures 2.5 x 2 cm and is significantly decreased in size from prior.       07/30/2018 Tumor Markers    Tumor markers tested:  CA 125.  Results:   11.4.     08/27/2018 Tumor Markers    Tumor markers tested:  CA 125.  Results:   8.7.     09/20/2018 Procedure    Port removed: While the skin surface appeared inflamed, there was no evidence of infection in the port reservoir.       09/22/2018 Imaging    CT Chest-No evidence of thoracic metastatic disease.  Suspected small seroma in the base of the right neck.  Necrotic metastatic lesion could also have this appearance but considered less likely.    [name]- Interval decrease in size of a left periaortic necrotic lymph node. No evidence of new metastatic disease.     10/15/2018 Imaging    Imaging Completed:  PET scan of  whole body  Result: Resolution of the previous hypermetabolic para-aortic lymph nodes and hepatic dome lesions.  Hypermetabolic focus within the left thyroid lobe.  Consider further evaluation with ultrasound, if clinically indicated.       10/19/2018 Imaging    Imaging Completed:  Ultrasound of  neck  Result: 1.2 cm irregular hypoechoic nodule in the left thyroid lobe is suspicious. Recommend FNA for further evaluation.  1.0 cm nodule right thyroid lobe containing macrocalcification is indeterminate. Recommend follow-up thyroid ultrasound in one year to evaluate for stability.     10/21/2018 Tumor Markers    Tumor markers tested:  CA 125.  Results:   4.8.     11/12/2018 Tumor Markers    Tumor markers tested:  CA 125  Results:   5.5     11/12/2018 - 12/03/2018 Systemic Therapy (Oral and IV)     11/12/2018: C1 rubraca 600 mg po BID. RTC 2 weeks for labs (CBC, CMP, 4 weeks for PCE #2). Pt plans to go to [place] and may leave before PCE and re-est care at [facility] (Dr. Gerilyn Pilgrim). Referral to ENT placed (pt cancelled appt for 11/22) and FNA was prev ordered for thyroid nodule- but has not been done.        03/02/2019 Imaging    CT Scan from [facility]    No evidence for recurrent or new neoplasm. Increased colonic stool could indicate constipation. Postoperative changes     06/13/2019 Tumor Markers    Tumor markers tested: CA 125 Results:   5.0.     09/15/2019 Tumor Markers    CA 125-6.1     05/31/2020 Tumor Markers    Ca 125-5.9     08/16/2020 Tumor Markers    CA 125-6.2     02/06/2021 Tumor Markers    CA 125-4.6     08/25/2022 Imaging  CT a/p: 2.6 cm lesion along the dome of the right hepatic lobe may represent a peritoneal lesion along the undersurface of the diaphragm rather than a true hepatic parenchymal lesion. 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     CT chest: No evidence of intrathoracic metastatic disease.      08/29/2022 Imaging    PET: 1.  Two FDG avid foci at the superior surface of right hepatic lobe, favored to be peritoneal nodules along the undersurface of right hemidiaphragm, concerning for metastases.      09/15/2022 Procedure    09/15/2022:   scheduled for Power Port Insertion with Dr. Andrey Campanile      09/16/2022 -  Systemic Therapy (Oral and IV)    09/16/2022:   C1 carboplatin AUC4, gem 1000 mg/m2 (D1/8), Bev D1 q 21 days  Treatment Summary   Treatment goal Disease control   Plan Name OP Gyn CARBOplatin AUC 4 / Gemcitabine / Bevacizumab   Status Active   Start Date 09/16/2022   End Date 11/25/2022 (Planned)   Provider Mikle Bosworth, MD   Chemotherapy bevacizumab (AVASTIN) 850 mg in sodium chloride 0.9 % 100 mL chemo infusion, 15 mg/kg = 850 mg, Intravenous, Once, 1 of 4  cycles  Administration: 850 mg (09/16/2022)  gemcitabine (GEMZAR) 1,600 mg in sodium chloride 0.9 % 100 mL chemo infusion, 1,000 mg/m2 = 1,600 mg, Intravenous, Once, 1 of 4 cycles  Administration: 1,600 mg (09/16/2022), 1,600 mg (09/23/2022)            09/16/2022 Tumor Markers    Tumor markers tested:  CA 125  Results:   7.5           Past Medical History  She  has a past medical history of Bowel obstruction (CMS-HCC) (2018), Ovarian cancer (CMS-HCC) (2014), and Thyroid disease.  Past Medical History:   Diagnosis Date    Bowel obstruction (CMS-HCC) 2018    Ovarian cancer (CMS-HCC) 2014    Thyroid disease        Past Surgical History  Past Surgical History:   Procedure Laterality Date    ABDOMINOPLASTY  1999    BLEPHAROPLASTY  2000    DILATION AND CURETTAGE OF UTERUS  1979    ELBOW SURGERY  1990    EYE SURGERY  1994    INSERTION CATHETER VASCULAR ACCESS N/A 09/15/2022    Procedure: INSERTION POWER PORT A CATH;  Surgeon: Garrel Ridgel, MD;  Location: [facility] OR;  Service: General;  Laterality: N/A;  Left Subclavian    KNEE ARTHROPLASTY Right     ROTATOR CUFF REPAIR      TRANSUMBILICAL AUGMENTATION MAMMAPLASTY      VAGINAL HYSTERECTOMY  1979       Family History  History reviewed. No pertinent family history.    Family cancer history for ovarian, uterine and colon cancer is negative other than above.       Social History  Social History     Socioeconomic History    Marital status: Married     Spouse name: None    Number of children: None    Years of education: None    Highest education level: None   Occupational History    None   Tobacco Use    Smoking status: Some Days     Packs/day: 0.00     Types: Cigarettes    Smokeless tobacco: Never    Tobacco comments:  Smokes 1 pack every three days    Vaping Use    Vaping Use: Never used   Substance and Sexual Activity    Alcohol use: Yes     Alcohol/week: 14.0 standard drinks     Types: 14 Shots of liquor per week    Drug use: No    Sexual activity: Not Currently   Other  Topics Concern    Caffeine Use Yes    Occupational Exposure No    Exercise Yes    Seat Belt Yes   Social History Narrative    Mammogram-6 years ago and it was normal. Pt stated she isn't getting them anymore.      Colonoscopy-10 years ago        Pt reports she can lie flat in bed without SOB.    Pt states she can walk a flight of stairs and city block without chest pain and SOB>       Social Determinants of Health     Financial Resource Strain: Not on file   Physical Activity: Not on file   Stress: Not on file   Social Connections: Not on file   Housing Stability: Not on file       Past OB/GYN History  G3P2  She reports menarche at age 83 and menopause at age 71-surgical.  She denies a history of STIs.  She denies a history of abnormal cervical cytology and reports her last cytologic examination she is unsure of. She has taken HRT.     Health maintenance:  Mammogram: Date 6 years ago Results normal  Colonoscopy: Date 10 years ago Results normal    Allergies  She is allergic to nsaids (non-steroidal anti-inflammatory drug), sulfamethoxazole-trimethoprim, celecoxib, and metoclopramide.    BMI  Body mass index is 21.8 kg/m.    Disease Status: No evidence of disease/remission (08/21/2022 12:27 PM)              Histories  She has a past medical history of Bowel obstruction (CMS-HCC) (2018), Ovarian cancer (CMS-HCC) (2014), and Thyroid disease.    She has a past surgical history that includes Dilation and curettage of uterus (1979); Vaginal hysterectomy (1979); Elbow surgery (1990); Eye surgery (1994); Abdominoplasty (1999); Blepharoplasty (2000); Transumbilical augmentation mammaplasty; Rotator cuff repair; Knee Arthroplasty (Right); and insertion catheter vascular access (N/A, 09/15/2022).    Her family history is not on file.    She reports that she has been smoking cigarettes. She has never used smokeless tobacco. She reports current alcohol use of about 14.0 standard drinks per week. She reports that she does not use  drugs.    Allergies  Nsaids (non-steroidal anti-inflammatory drug), Sulfamethoxazole-trimethoprim, Celecoxib, and Metoclopramide    Medications  Outpatient Encounter Medications as of 10/07/2022   Medication Sig Dispense Refill    ascorbic acid, vitamin C, (VITAMIN C) 1000 MG tablet Take 1 tablet (1,000 mg total) by mouth daily.      cholecalciferol, vitamin D3, (VITAMIN D3) 1000 units tablet Take 1 tablet (1,000 Units total) by mouth daily. 30 tablet 0    dexAMETHasone (DECADRON) 4 MG tablet Take 8 mg by mouth daily with food on day 2, 3, and 4 of chemotherapy cycle 18 tablet 4    GLUTATHIONE-L ORAL Take 1 teaspoonful by mouth daily.      IODINE ORAL Take 50 mg/day by mouth.      Lactobac no.41/Bifidobact no.7 (PROBIOTIC-10 ORAL) Take 1 capsule by mouth daily. Garden of Life brand      levothyroxine (  SYNTHROID) 75 MCG tablet Take 1 tablet (75 mcg total) by mouth every morning before breakfast.      omeprazole (PRILOSEC OTC) 20 MG tablet Take 1 tablet (20 mg total) by mouth every morning before breakfast. 20mg  twice a day      ubidecarenone (COENZYME Q10 ORAL) Take 1 tablet by mouth daily.      UNABLE TO FIND Take 30 drops by mouth daily. Multimineral      UNABLE TO FIND Take 20 drops by mouth daily. Med Name: Lurena Joiner Drops      UNABLE TO FIND Take 2 capsules by mouth daily. Med Name: Body Bio      vit b complex w-b 12 tablet Take 1 tablet by mouth daily.      ZINC CITRATE ORAL Take 1 tablet by mouth daily.       No facility-administered encounter medications on file as of 10/07/2022.        Review of Systemssee HPI    Vitals  Blood pressure 162/84, pulse 83, temperature 97.3 F (36.3 C), temperature source Oral, height 5' 4 (1.626 m), weight 127 lb (57.6 kg), SpO2 98 %.    Physical Exam   Vitals reviewed.  Constitutional: She is oriented to person, place, and time. She appears well-developed.   HENT:   Head: Atraumatic.   Neck: No thyromegaly present.   Cardiovascular: Normal rate.   Pulmonary/Chest: Effort normal.    Abdominal: Soft. She exhibits no distension and no mass. There is no abdominal tenderness. There is no rebound and no guarding.   No masses or tenderness.  No hernia palpable.   No hepatosplenomegaly.            Genitourinary:    Genitourinary Comments: Normal external genitalia/vulva.  Normal urethral meatus, urethra, bladder, anus and perineum.  Normal vagina and vaginal cuff.  Uterus, cervix and adnexa surgically absent.  Normal rectum.  There is no nodularity in the posterior culdesac or rectovaginal septum.       Musculoskeletal:         General: Normal range of motion.      Cervical back: Neck supple.   Lymphadenopathy:     She has no cervical adenopathy.   Neurological: She is alert and oriented to person, place, and time.   Skin: Skin is warm and dry.   Psychiatric: Her behavior is normal.        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     Posterior cul de sac biopsy-metastatic adenocarcinoma.     Omentectomy- metastatic adenocarcinoma  LND-negative     Residual right IP ligament, Anterior cul de sac biopsy, right pelvic side will biopsy, left pelvic side wall, right gutter and left gutter-->benign    04/06/2017:   CT [name]- mild small bowel obstruction with  transition point in the left pelvis adjacent to the anterior abdominal wall. Low density in the common femoral veins bilaterally thought likely to represent flow artificat, may consider follow up with lomer extremity Doppler to ensure there is no underlying deep venous thrombosis.    01/29/2018:   CTAP:  Interval development of necrotic retroperitoneal lymphadenopathy with largest node measuring 4.9  4.2 x 4.8 cm. Origin is indeterminate.  Options would include CT directed biopsy as well as PET/CT.    02/18/2018:   CA 125:  197.8    02/26/2018:   CT guided Left retroperitoneal node biopsy- focally involved by non-small cell malignancy- additional stains are diagnostic of non-small cell carcionoma and supportive of involve71ment by the patients reported previous known ovarian malignancy.    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  No other focal areas of FD avid malignancy are found. Specifically, no lymph node activity is found outside of the retroperitoneum and there is no evidence for FDG avid pulmonary, skeletal or hepatic parenchymal metastasis.    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 No retroperitoneal, intra-abdominal, pelvic or inguinal adenopathy, no evidence for peritoneal surface neoplastic involvement. No evidence for recurrent or new neoplasm    Cancer Staging:   Cancer Staging   Malignant neoplasm of ovary (CMS-HCC)  Staging form: Ovary, Fallopian Tube, And Primary Peritoneal Carcinoma, AJCC 8th Edition  - Clinical: FIGO Stage IIIC - Signed by Mikle Bosworth, MD on 04/01/2018      Disease Status: No evidence of disease/remission (08/21/2022 12:27 PM)                Assessment & Plan  My impression is Ms. 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 She completed 6 cycles of chemotherapy with Carboplatin and Paclitaxel-weekly (C6D1 was 08/27/18). CA 125 was 8.7 at C6.     She underwent CT CAP on 09/22/2018 revealed ned in chest and interval decrease in size of a left periaortic necrotic lymph node from 1.8 to 1.6 cm. 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     PET on 10/15/18 shows reassuring intraabdominal findings with resolution of the previous hypermetabolic para-aortic lymph nodes and hepatic dome lesions, but did note uptake within the thyroid. She underwent thyroid US on 10/20/18 with a 1.2 cm irregular hypoechoic nodule in the left thyroid lobe is suspicious. Recommend FNA for further evaluation.     She initiated maintenance rubraca 11/2018 but ultimately elected to stop in 02/2019 due to side effects.   Patient had reestarted drug at 500 mg po BID once she got down to [place]-. Was in [place] from 12/2018-05/2019. Reports her oncologist down in [employer] had changed doses of Rubraca a few times, to  300mg  po BID but decided to stop drug all together back in 02/2019 2/2 to decreased QOL. She does not wish to restart drug at this time. Last CT scan 03/02/2019 in [place], no evidence of recurrent or new neoplasm.     She was seen for surveillance 08/21/2022 with an increase in her CA 125 to 8.5 - CT CAP and PET were checked and consistent with platinum sensitive recurrence #2.     At this time, she has decided to proceed with chemotherapy.    She is seen today for C2 of Carboplatin/Gemcitabine/bevacizumab.      Kelsey Sullivan is tolerating chemotherapy appropriately well, and has experienced  expected but manageable side effects. Her counts were reviewed and are NOT appropriate for therapy as ANC is 656 and will delay treatment to 11/9 (was initially due 11/2 as moving txs to [facility]) and will also add neulasta D9.  We will see her back in 1 week to reassess for her next cycle of treatment, and she knows to call if questions or problems arise.        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 Ultrasound performed on 10/20/18 with 1.2 cm irregular hypoechoic nodule, appearing suspicious. Recommend FNA for further evaluation, which was ordered. Pt has not followed up to date.  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 Referral to ENT made, but patient did not see them. She followed up with her PCP and was started on Synthroid. Last TSH 5.91. Repeat TSH/T4/T3 labs wnl 11/2020     Genetics:  previously discussed her negative genetic testing results.    Pain  Pain Score: Zero (10/07/2022  8:57 AM)    She denies presence of pain.  Pain will be reassessed at next visit.       The HPI, ROS, and test results were reviewed and copied forward (with edits) from a note written by Dr. [name] on 09/16/22. I have reviewed and updated the history, physical exam, data, assessment, and plan of the note so that it reflects my evaluation and management of the patient.    Glenice Bow, CNP  Division of Gynecologic Oncology  [phone]    I saw and personally examined the patient today . I discussed the findings and therapeutic plan with the patient. I repeated, reviewed and agree with the  history of present illness, past medical histories, family history, social history, medication list, and allergies as listed. The review of systems is as noted above. My physical exam confirms the findings listed above. Review of labs, pathology reports, radiograph reports, and medical records confirm the findings noted above. I agree with the assessment and plan as noted above. I have edited the note where appropriate.     I have personally performed a face to face diagnostic evaluation on this patient, seen initially by the CNP. I have personally developed the care plan as outlined in the Assessment and Plan.     This note was completely edited, written and reviewed by me and consists of information  cut and pasted from the my most recent visit, my smart phrases and other Epic tools. I have personally reviewed all aspects of this note to at least include reviewing this patient's chart and problem list, updating the history, physical exam, lab and procedure results, and assessment and plan as detailed above and below. As such this visit note reflects my current evaluation and management for this patient.    This note was copied forward from the note written by me on 09/16/22. I have reviewed and updated the history, physical exam, data, assessment and plan of the note so that it reflects the evaluation and management of the patient on 10/07/22       Complexity: high     I spent a total of 45 minutes was spent prior to, during and after the patient encounter in counseling and/or coordination of care, documentation and counseling with patient and/or family.      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

## 2022-10-07 NOTE — Unmapped (Signed)
Patient here for lab draw and Port flush. Port accessed with good blood return and flushes easily. Labs drawn and sent. Port flushed and de accessed. Patient here to see Dr Lenora BoysBillingsley. .Marland Kitchen

## 2022-10-09 ENCOUNTER — Encounter

## 2022-10-09 ENCOUNTER — Ambulatory Visit: Payer: Medicare (Managed Care)

## 2022-10-13 NOTE — Unmapped (Signed)
Spoke to patient, after discussion with Misty StanleyLisa PharmD email sent to financial services for assistance. Patient with high copay she is unsure she will be able to afford.

## 2022-10-13 NOTE — Unmapped (Signed)
Patient called to discuss billing, transferred to billing

## 2022-10-14 ENCOUNTER — Encounter

## 2022-10-16 ENCOUNTER — Ambulatory Visit: Admit: 2022-10-16 | Discharge: 2022-10-16 | Payer: Medicare (Managed Care)

## 2022-10-16 ENCOUNTER — Encounter

## 2022-10-16 ENCOUNTER — Institutional Professional Consult (permissible substitution): Admit: 2022-10-16 | Payer: Medicare (Managed Care)

## 2022-10-16 ENCOUNTER — Ambulatory Visit: Admit: 2022-10-16 | Payer: Medicare (Managed Care)

## 2022-10-16 ENCOUNTER — Other Ambulatory Visit: Admit: 2022-10-16 | Payer: Medicare (Managed Care)

## 2022-10-16 DIAGNOSIS — C569 Malignant neoplasm of unspecified ovary: Secondary | ICD-10-CM

## 2022-10-16 DIAGNOSIS — C562 Malignant neoplasm of left ovary: Secondary | ICD-10-CM

## 2022-10-16 LAB — CBC
Hematocrit: 34.2 % — ABNORMAL LOW (ref 35.0–45.0)
Hemoglobin: 11.8 g/dL (ref 11.7–15.5)
MCH: 34.4 pg — ABNORMAL HIGH (ref 27.0–33.0)
MCHC: 34.6 g/dL (ref 32.0–36.0)
MCV: 99.5 fL (ref 80.0–100.0)
MPV: 7.1 fL — ABNORMAL LOW (ref 7.5–11.5)
Platelets: 367 10E3/uL (ref 140–400)
RBC: 3.44 10E6/uL — ABNORMAL LOW (ref 3.80–5.10)
RDW: 13.7 % (ref 11.0–15.0)
WBC: 5.3 10E3/uL (ref 3.8–10.8)

## 2022-10-16 LAB — COMPREHENSIVE METABOLIC PANEL
ALT: 17 U/L (ref 7–52)
AST: 24 U/L (ref 13–39)
Albumin: 4.3 g/dL (ref 3.5–5.7)
Alkaline Phosphatase: 79 U/L (ref 36–125)
Anion Gap: 9 mmol/L (ref 3–16)
BUN: 11 mg/dL (ref 7–25)
CO2: 28 mmol/L (ref 21–33)
Calcium: 9.2 mg/dL (ref 8.6–10.3)
Chloride: 98 mmol/L (ref 98–110)
Creatinine: 0.72 mg/dL (ref 0.60–1.30)
EGFR: 84
Glucose: 103 mg/dL — ABNORMAL HIGH (ref 70–100)
Osmolality, Calculated: 280 mosm/kg (ref 278–305)
Potassium: 4.4 mmol/L (ref 3.5–5.3)
Sodium: 135 mmol/L (ref 133–146)
Total Bilirubin: 0.4 mg/dL (ref 0.0–1.5)
Total Protein: 7.1 g/dL (ref 6.4–8.9)

## 2022-10-16 LAB — URINALYSIS W/RFL TO MICROSCOPIC
Bilirubin, UA: NEGATIVE
Blood, UA: NEGATIVE
Glucose, UA: NEGATIVE mg/dL
Ketones, UA: NEGATIVE mg/dL
Leukocytes, UA: NEGATIVE
Nitrite, UA: NEGATIVE
Protein, UA: NEGATIVE mg/dL
Specific Gravity, UA: 1.009 (ref 1.005–1.035)
Urobilinogen, UA: <2 mg/dL (ref 0.2–1.9)
pH, UA: 7.5 (ref 5.0–8.0)

## 2022-10-16 LAB — CA 125: CA 125: 7.9 U/mL (ref 5.5–35.0)

## 2022-10-16 LAB — DIFFERENTIAL
Basophils Absolute: 53 /uL (ref 0–200)
Basophils Relative: 1 % (ref 0.0–1.0)
Eosinophils Absolute: 53 /uL (ref 15–500)
Eosinophils Relative: 1 % (ref 0.0–8.0)
Lymphocytes Absolute: 1897 /uL (ref 850–3900)
Lymphocytes Relative: 35.8 % (ref 15.0–45.0)
Monocytes Absolute: 880 /uL (ref 200–950)
Monocytes Relative: 16.6 % (ref 0.0–12.0)
Neutrophils Absolute: 2417 /uL (ref 1500–7800)
Neutrophils Relative: 45.6 % (ref 40.0–80.0)
nRBC: 0 /100 WBC (ref 0–0)

## 2022-10-16 LAB — MAGNESIUM: Magnesium: 1.8 mg/dL (ref 1.5–2.5)

## 2022-10-16 LAB — PROTEIN / CREATININE RATIO, URINE
Creatinine, Urine: 39 mg/dL
Prot/Creat Ratio, Ur: 0.1 ratio
Total Protein, Ur: 4 mg/dL

## 2022-10-16 MED ORDER — dexAMETHasone (DECADRON) tablet 12 mg
4 | Freq: Once | ORAL | Status: AC
Start: 2022-10-16 — End: 2022-10-16
  Administered 2022-10-16: 17:00:00 12 mg via ORAL

## 2022-10-16 MED ORDER — bevacizumab (AVASTIN) 850 mg in sodium chloride 0.9 % 100 mL chemo infusion
25 | Freq: Once | INTRAVENOUS | Status: AC
Start: 2022-10-16 — End: 2022-10-16
  Administered 2022-10-16: 20:00:00 850 mg/kg via INTRAVENOUS

## 2022-10-16 MED ORDER — albuterol (PROVENTIL) 90 mcg/actuation inhaler 1-2 puff
90 | RESPIRATORY_TRACT | PRN
Start: 2022-10-16 — End: 2022-10-16

## 2022-10-16 MED ORDER — sodium chloride 0.9% flush 10 mL
Freq: Every day | INTRAMUSCULAR | PRN
Start: 2022-10-16 — End: 2022-10-16

## 2022-10-16 MED ORDER — EPINEPHrine injection 0.3 mg
1 | Freq: Every day | INTRAMUSCULAR | PRN
Start: 2022-10-16 — End: 2022-10-16

## 2022-10-16 MED ORDER — aprepitant (CINVANTI) IV emulsion 130 mg
7.2 | Freq: Once | INTRAVENOUS | Status: AC
Start: 2022-10-16 — End: 2022-10-16
  Administered 2022-10-16: 17:00:00 130 mg via INTRAVENOUS

## 2022-10-16 MED ORDER — heparin lock flush 100 unit/mL syringe 500 Units
100 | Freq: Every day | INTRAVENOUS | Status: AC | PRN
Start: 2022-10-16 — End: 2022-10-17

## 2022-10-16 MED ORDER — sodium chloride 0.9% flush 20 mL
Freq: Every day | INTRAMUSCULAR | Status: AC | PRN
Start: 2022-10-16 — End: 2022-10-17

## 2022-10-16 MED ORDER — hydrocortisone sod succ (PF) (SOLU-CORTEF) injection 100 mg
100 | Freq: Every day | INTRAMUSCULAR | PRN
Start: 2022-10-16 — End: 2022-10-16

## 2022-10-16 MED ORDER — diphenhydrAMINE (BENADRYL) injection 25 mg
50 | Freq: Every day | INTRAMUSCULAR | PRN
Start: 2022-10-16 — End: 2022-10-16

## 2022-10-16 MED ORDER — CARBOplatin (PARAPLATIN) 300 mg in dextrose 5% in water (D5W) 250 mL chemo infusion
Freq: Once | INTRAVENOUS | Status: AC
Start: 2022-10-16 — End: 2022-10-16
  Administered 2022-10-16: 19:00:00 300 mg via INTRAVENOUS

## 2022-10-16 MED ORDER — palonosetron (ALOXI) injection 0.25 mg
0.25 | Freq: Once | INTRAVENOUS | Status: AC
Start: 2022-10-16 — End: 2022-10-16
  Administered 2022-10-16: 17:00:00 0.25 mg via INTRAVENOUS

## 2022-10-16 MED ORDER — LORazepam (ATIVAN) tablet 0.5 mg
0.5 | Freq: Four times a day (QID) | ORAL | PRN
Start: 2022-10-16 — End: 2022-10-16

## 2022-10-16 MED ORDER — proCHLORPERazine (COMPAZINE) tablet 10 mg
10 | Freq: Four times a day (QID) | ORAL | PRN
Start: 2022-10-16 — End: 2022-10-16

## 2022-10-16 MED ORDER — gemcitabine (GEMZAR) 1,600 mg in sodium chloride 0.9 % 100 mL chemo infusion
200 | Freq: Once | INTRAVENOUS | Status: AC
Start: 2022-10-16 — End: 2022-10-16
  Administered 2022-10-16: 18:00:00 1600 mg/m2 via INTRAVENOUS

## 2022-10-16 MED ORDER — heparin lock flush 100 unit/mL syringe 500 Units
100 | Freq: Every day | INTRAVENOUS | PRN
Start: 2022-10-16 — End: 2022-10-16
  Administered 2022-10-16: 20:00:00 500 [IU]

## 2022-10-16 MED ORDER — sodium chloride 0.9 % infusion
Freq: Every day | INTRAVENOUS | PRN
Start: 2022-10-16 — End: 2022-10-16

## 2022-10-16 MED FILL — GEMCITABINE 200 MG INTRAVENOUS SOLUTION: 200 200 mg | INTRAVENOUS | Qty: 42.08

## 2022-10-16 MED FILL — AVASTIN 25 MG/ML INTRAVENOUS SOLUTION: 25 25 mg/mL | INTRAVENOUS | Qty: 34

## 2022-10-16 MED FILL — DEXAMETHASONE 4 MG TABLET: 4 4 MG | ORAL | Qty: 3

## 2022-10-16 MED FILL — PALONOSETRON 0.25 MG/5 ML INTRAVENOUS SOLUTION: 0.25 0.25 mg/5 mL | INTRAVENOUS | Qty: 5

## 2022-10-16 MED FILL — CINVANTI 7.2 MG/ML INTRAVENOUS EMULSION: 7.2 7.2 mg/mL | INTRAVENOUS | Qty: 18

## 2022-10-16 MED FILL — CARBOPLATIN 10 MG/ML INTRAVENOUS SOLUTION: 10 10 mg/mL | INTRAVENOUS | Qty: 30

## 2022-10-16 NOTE — Unmapped (Signed)
Rechecked BP, too elevated for Avastin.     Checked Manual pressure and okay for treatment.     1303 Gemzar-Treatment plan orders, signed consent form, recent laboratory values, allergies, appropriateness of regimen, drug supplied, height, weight, BSA, dose calculations, expiration dates, drug appearance, rate of administration, IV pump settings & two patient identifiers were independently verified by two RN's prior to administration as documented on chemo checklist flowsheet.  Premedications given, please see MAR,  port checked and showed positive blood return. Medication hung on pump per [facility] order. Pt denies any side effects at this time and all needs addressed. Education given on potential infusion reactions and to notify RN immediately if any symptoms noted.    1350 Carboplatin-Treatment plan orders, signed consent form, recent laboratory values, allergies, appropriateness of regimen, drug supplied, height, weight, BSA, dose calculations, expiration dates, drug appearance, rate of administration, IV pump settings & two patient identifiers were independently verified by two RN's prior to administration as documented on chemo checklist flowsheet.  Medication hung on pump per [facility] order. Pt denies any side effects at this time and all needs addressed. Education given on potential infusion reactions and to notify RN immediately if any symptoms noted.    1434 Avastin-Treatment plan orders, signed consent form, recent laboratory values, allergies, appropriateness of regimen, drug supplied, height, weight, BSA, dose calculations, expiration dates, drug appearance, rate of administration, IV pump settings & two patient identifiers were independently verified by two RN's prior to administration as documented on chemo checklist flowsheet.  Medication hung on pump per [facility] order. Pt denies any side effects at this time and all needs addressed. Education given on potential infusion reactions and to notify RN immediately if  any symptoms noted.    1504 Treatment complete, pt tolerated well, denies any side effects at this time. Please see flowsheet for post care VS. Follow up appointment verified and port checked and noted positive blood return,  flushed and heparinized, access removed with tip intact. Pt educated on chemotherapy tips/safety and when to seek medical attention while on chemo Rx. Pt discharged to home in baseline condition. Paper AVS with education provided, pt is aware AVS is available in their MyChart Chart.

## 2022-10-16 NOTE — Unmapped (Signed)
Contact the Hematology/ Oncology Office at [phone])  If it is after hours Between 5 PM and 8 AM (overnight), weekends and holidays:  You will be forwarded to the [facility] Operator.  Ask for the Hematology/Oncology Dr. on call to address your issues.      When to contact your doctor or health care provider:  Contact your health care provider immediately, day or night, if you should experience any of the following symptoms, or go to the ER if after normal clinic hours (8am-5pm):  ~ Fever of 100.13F or greater, or shaking chills.  ~Vomiting for more than 2 times within 24 hours without relief after using anti-nausea/vomiting medications.  ~Severe abdominal cramping or diarrhea with more than 3 watery bowel movements in one day.  ~Pain, redness, swelling or drainage at an IV or Port-A-Cath site.  ~Unusual or excessive bleeding from your mouth, nose, rectum, or vagina; or blood in your urine or bowel movements.  ~Coughing up blood.  ~A sudden onset of shortness of breath, chest pain or leg swelling.  ~Severe dizziness, severe lightheadedness or passing out(fainting).  ~New onset seizures (epilepsy), persistent or worsening headache.  ~Sudden weakness of an arm or leg, or difficulty speaking.  ~Severe allergic reaction with swelling of mouth or throat or trouble breathing.  ~Sudden changes in alertness or loss of consciousness.    Report within 24 hours:  ~Pain not relieved by your usual pain medication(s).  ~Unusual bruising of any body part.  ~Change in breathing patterns such as difficulty breathing or shortness of breath when you are resting.  ~Change in urination;cloudiness, odor, frequency, burning or pain.  ~Inability to drink or swallow liquids      Most chemo drugs make you less able to fight infection, but there are ways you can avoid them.  Stay away from anyone who is sick.   Wash your hands often, especially before touching your face, nose, mouth, or eyes. Ask your family and friends to do  the same when they are with you.   Very few treatments require you to avoid close contact with loved ones for a short amount of time. If this is something youll have to do, your doctor will tell you about it when going over treatment options.   Make sure your vaccinations are up to date. The flu shot is especially important because people with cancer are at high risk of serious flu complications. Your health care provider will recommend which vaccines you need.   Infections can be picked up from food and drinks. So, food safety is very important when your immune system is weak. Talk to your cancer care team about whether you need to follow a special diet during your cancer treatment.   Avoid using tampons and suppositories    From cancer.org    Chemotherapy Tips  Mouth care  ? Maintain oral hygiene  o Brush teeth at least twice a day with a soft bristle toothbrush  ? Can soften bristles by soaking brush in hot water before brushing  o If you routinely floss, keep doing so at least once daily unless told by your doctor to stop flossing; talk  with your doctor if flossing causes bleeding  ? Maintain denture hygiene  o Remove and clean dentures between meals  o If mouth sores develop under dentures, leave dentures out between meals and at night  o Clean dentures between uses and store in an anti-bacterial soak  o If dentures fit poorly, talk with  your dentist about fixing the fit of your dentures  ? If using mouthwash, use alcohol-free mouthwashes (examples: Biotene, [employer] Total Advanced Pro-Shield)  to avoid irritation to mouth sores and drying mouth out  ? If mouth sores develop, may consider using baking soda and salt water mixture to keep mouth clean  o Mix 1 teaspoon of baking soda into 2 cups of warm water  o Swish and spit several times per day    Skin care  ? Alert your clinic nurse or doctor if you develop a rash after chemotherapy  ? Apply alcohol/scent-free lotion several times during the day to prevent  dry and cracked skin (examples:  Eucerin, Cetaphil)  ? Avoid long, hot showers and baths if you develop a rash or very dry skin  ? Use an electric shaver instead of a razor to avoid cuts    Food safety  ? Wash fresh fruit and vegetables, including peeled items before eating and tops of canned foods before opening  ? Cook food thoroughly; avoid eating raw meat/fish and cook until no longer pink unless otherwise discussed with  your doctor  ? Avoid eating from salad bars and buffets and drinking from soda fountains    Pet safety  ? Wash hands after touching animals and pets  ? Have someone clean up after pets  o If unable to have someone clean up after pets, wear gloves and a mask when cleaning after pets and wash  hands afterwards  ? Keep litter boxes away from food preparation area and where people eat  ? Keep pets from biting or scratching you; trim pet claws to avoid being scratched  o Wash scratches with warm water and soap; monitor for infection (redness, swelling or pus) and alert  clinic nurse if infection present  ? Avoid taking in stray animals or coming into contact with stray animals  ? Avoid being around certain types of pets such as:  o Reptiles (snakes, lizards, turtles, iguanas)  o Chickens and ducks  o Hamsters, gerbils, mice/rats, [place] pigs, and ferrets      CHEMOTHERAPY TIPS    Remember to double flush your toilet for the next 48 hours or as informed by your Physician. Your body eliminates your chemotherapy through your urine and/or stool or emesis(vomit).    During - and for 48 hours after (or per your Physician) - chemo:  Flush the toilet twice after you use it. Put the lid down before flushing to avoid splashing. If possible, you may want to use a separate toilet during this time. If this is not possible, wear gloves to clean the toilet seat after each use.   Both men and women should sit on the toilet to use it. This cuts down on splashing.   Always wash your hands with warm water and soap  after using the toilet. Dry your hands with paper towels and throw them away.   If you vomit into the toilet, clean off all splashes and flush twice. If you vomit into a bucket or basin, carefully empty it into the toilet without splashing the contents and flush twice. Wash out the bucket with hot, soapy water and rinse it; empty the wash and rinse water into the toilet, then flush. Dry the bucket with paper towels and throw them away.   Caregivers should wear 2 pairs of throw-away gloves if they need to touch any of your body fluids. (These can be bought in most drug stores.) They should always wash  their hands with warm water and soap afterward - even if they had gloves on.   If a caregiver does come in contact with any of your body fluids, they should wash the area very well with warm water and soap. Itâs not likely to cause any harm, but try to avoid this. At your next visit, let your doctor know this happened. Being exposed often may lead to problems, and extra care should be taken to avoid this.   Any clothes or sheets that have body fluids on them should be washed in your washing machine - not by hand. Wash them in warm water with regular laundry detergent. Do not wash them with other clothes. If they canât be washed right away, seal them in a plastic bag.   If using throw-away adult diapers, underwear, or sanitary pads, seal them in 2 plastic bags and throw them away with your regular trash.               Patients on home infusion pumps should use a plastic-backed mattress  pad to protect your mattress from accidental contamination.    Questions and Answers   Is it safe for family members to have contact with me during my chemo treatment?   Yes. Eating together, enjoying favorite activities, hugging, and kissing are all safe.     Is it safe for my family to use the same toilet as I do?   Yes. As long as you clean any chemo waste from the toilet seat, sharing is safe.     What should I do if I do not have  control of my bladder or bowels?   Use a disposable, plastic-backed pad, diaper, or sheet to soak up urine or stool. Change immediately when soiled, and wash skin with soap and water. If you have an ostomy, your caregiver should wear gloves when emptying or changing the bags. Discard disposable ostomy supplies in the chemo waste container.     What if I use a bedpan, urinal, or commode?   Your caregiver should wear gloves when emptying body wastes. Rinse the container with water after each use, and wash it with soap and water at least once a day.     Is it safe to be sexually active during my treatment?   Ask your doctor or your nurse this question. Traces of chemo may be present in vaginal fluid and semen for up to 48 hours after treatment. Special precautions may be necessary.     How should I store chemo at home?   If you take chemo at home, store chemo and equipment in a safe place, out of reach of children and pets. Do not store chemo in the bathroom, as high humidity may damage the drugs. Check medicine labels to see if your chemo should be kept in the refrigerator or away from light. Be sure all medicines have complete labels.         Is it safe to dispose of chemo in the trash?   No. Chemo waste is dangerous and requires separate handling. If you are receiving IV chemo at home, you should have a special waste container for the chemo and equipment. This includes used syringes, needles, tubing, bags, cassettes, and vials. This container should be hard plastic and labeled with âChemotherapy Wasteâ or a similar warning.          Note the following changes to our contact information and addresses     Phone: [phone]    Option 1 - NPV / referring MD  Option 2 - established pt scheduling  Option 3 - pharmacy, medical questions, RN  Option 4 - billing  Option 5 - medical records     Fax: [phone]     Address Changes:  [place]  (previously [place] - [address])  [place] - [address] (was [address])    For Medication Refills:   Please allow atleast 2 business days for all medication refills, including narcotics. Please reach out to the filling pharmacy to request a refill for all non-narcotic medications. The filling pharmacy should also be who to contact to verify when to pick up the medication.     All Paperwork:  Please allow up to 7 business days from the time of request for all disability, FMLA, etc to be filled out. Please specify what your request is as to what and where we should send completed paperwork. (This is to inform us if we should send the completed papers to you or directly to your employer).

## 2022-10-16 NOTE — Unmapped (Signed)
1025 Port accessed for labs prior to office visit and treatment. Port site appears unremarkable;pt denies any complaints with port. Urine collected for specimens and sent to lab.

## 2022-10-16 NOTE — Unmapped (Signed)
History of Present Illness  ALEDA MADL is a 81 y.o. 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     Ms. 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 She declined adjuvant therapy. In 01/2018, she experienced mid abdominal pain, and seen by PCP and ED, with CT of abdomen and pelvis showing interval development of necrotic retroperitoneal lymphadenopathy with largest node measuring 4.9  4.2 x 4.8 cm. Her CA 125 on 02/18/18 was 197.8.  She underwent a ct-guided nodal biopsy on 02/26/18, with pathology confirming recurrent disease.  She completed 6 cycles of chemotherapy with Carboplatin and Paclitaxel-weekly (C6D1 was 08/27/18). CA 125 was 8.7 at C6.     She underwent CT CAP on 09/22/2018 revealed ned in chest and interval decrease in size of a left periaortic necrotic lymph node from 1.8 to 1.6 cm. 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 PET on 10/15/18 shows reassuring intraabdominal findings with resolution of the previous hypermetabolic para-aortic lymph nodes and hepatic dome lesions, but did note uptake within the thyroid. She underwent thyroid US on 10/20/18 with a 1.2 cm irregular hypoechoic nodule in the left thyroid lobe is suspicious. Recommended FNA for further evaluation, but she did not get this done.    She initiated maintenance rubraca 11/2018 but ultimately elected to stop in 02/2019 due to side effects.     She was seen for surveillance 08/21/2022 with an increase in her CA  125 to 8.5 - CT CAP and PET were checked and consistent with platinum sensitive recurrence #2.   CT CAP on 08/25/2022 noted 2.6 cm lesion along the dome of the right hepatic lobe may represent a peritoneal lesion along the undersurface of the diaphragm rather than a true hepatic parenchymal lesion. 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   PET 08/29/2022 noted two FDG avid foci at the superior surface of right hepatic lobe, favored to be peritoneal nodules along the undersurface of right hemidiaphragm, concerning for metastases.     She proceeded on Carbo/gem/bev, C1 09/16/22. C2 was delayed to 10/16/2022 due to neutropenia, neulasta added for C2.    There are no changes to the cancer history HPI above.       Pt here for PCE.  Nandi Tonnesen Hammar is doing well.   She denies any abdominal/pelvic pain.   She denies any bloating or fullness.  She reports normal bowel/bladder habits.  She denies any GU concerns. Denies any dysuria, frequency, hematuria, flank pain or fevers.  She denies any vaginal bleeding or discharge.   She denies neuropathy.   She reports no leg swelling.   Her appetite is good, and reports no nausea or vomiting.   Mostly had bone pain controlled with tylenol. Resolved now.    There are no changes to the cancer history HPI above.       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 (mets to left tube, ovary, omentum, cul de sac peritoneum 12/2012, plat sensitive recurrence 03/2018    Disposition: RTC @ [facility] 10[facility]16/22 for PCE#2 carbo/gem/bev (deferred from 10/09/22),  2wks C2D8 gem only; Add neulasta  Port referral to surg onc  Current disease status: Neg for CS mutation, no VUS, lifetime remaining breast cancer risk -3.4%  Genetics: negative for mutations, breast cancer lifetime risk 3.4%  Survivorship plan: pending completion of treatment        Malignant neoplasm of ovary  (CMS-HCC)   12/28/2012 Pathology    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    Omentum-biopsy for frozen section-metastatic adenocarcionma.  Residual right IP ligament-benign smooth muscle and fibroadiopse tissue containing thick walled blood vessels.    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  Anterior cul de sac biopsy-benign fibroadipose tissue.   Posterior cul de sac biopsy-metastatic adenocarcinoma.  Right pelvic side will biopsy- benign fibroadipose tissue.  Left pelvic side wall, right gutter and Left gutter-benign fibroadipose tissue.  Omentectomy- metastatic adenocarcinoma  LND-negative     12/28/2012 Surgery    BSO, omentectomy, LND     12/28/2012 Initial Diagnosis    Malignant neoplasm of both ovaries (CMS Dx)     01/08/2013 - 01/08/2013 Systemic Therapy (Oral and IV)    Patient refused chemotherapy and did alternative treatment including a raw diet for 1 year.       04/06/2017 Imaging    CT [name]- mild small bowel obstruction with transition point in the left pelvis adjacent to the anterior abdominal wall. Low density in the common femoral veins bilaterally thought likely to represent flow artificat, may consider follow up with lomer extremity Doppler to ensure there is no underlying deep venous thrombosis.     01/29/2018 Imaging    CT [name]-  Interval development of necrotic retroperitoneal lymphadenopathy with largest node measuring 4.9  4.2 x 4.8 cm. Origin is indeterminate.  Options would include CT directed biopsy as well as PET/CT.     02/18/2018 Tumor Markers    CA 125:  197.8     02/26/2018 Biopsy    CT guided Left retroperitoneal node biopsy- focally involved by non-small cell malignancy- additional stains are diagnostic of non-small cell carcionoma and supportive of involve39ment by the patients reported previous known ovarian malignancy.      03/04/2018 Imaging    PET/CT-  FDG avid retroperitoneal lymphadenopathy as described above and an FDG avid lesion over the dome of the liver most likely a peritoneal implant rather than a liver metastasis.  No other focal areas of FD avid malignancy are found. Specifically, no lymph node activity is found outside of the retroperitoneum and there is no evidence for FDG avid pulmonary, skeletal or hepatic parenchymal metastasis.     04/01/2018 Cancer Staged     Cancer Staging   Malignant neoplasm of ovary (CMS-HCC)  Staging form: Ovary, Fallopian Tube, And Primary Peritoneal Carcinoma, AJCC 8th Edition  - Clinical: FIGO Stage IIIC - Signed by Mikle Bosworth, MD on 04/01/2018         04/12/2018 Procedure    IR port placed       04/15/2018 Genetic Testing    Myraid Testing-Neg     04/16/2018 - 08/27/2018 Systemic Therapy (Oral and IV)    Treatment Summary   Treatment  goal Disease control   Plan Name OP Gyn CARBOplatin AUC 6 / PACLitaxel Weekly   Status Active   Start Date 04/16/2018   End Date 09/10/2018 (Planned)   Provider Mikle Bosworth, MD   Chemotherapy PACLitaxel (TAXOL) 120 mg in sodium chloride 0.9 % 250 mL chemo infusion, 80 mg/m2 = 120 mg, Intravenous, Once, 4 of 6 cycles    CARBOplatin (PARAPLATIN) 480 mg in dextrose 5% in water (D5W) 250 mL chemo infusion, 480 mg, Intravenous, Once, 4 of 6 cycles  Dose modification: 497 mg (original dose 484.8 mg, Cycle 2, Reason: Other (See Comments))         04/16/2018:   C1 weekly taxol 80 mg/m2 D1,8,15, Carboplatin AUC 6 D1 q 21 days. PCEs on thurs, txt on fridays. EXAMS ON EVEN CYCLES     CA 125: 523.4     Genetic testing: Neg for CS mutation, no VUS, lifetime remaining breast cancer risk -3.4%    05/07/2018:   C2 weekly taxol 80 mg/m2 D1,8,15, Carboplatin AUC 6 D1 q 21 days. PCEs on thurs, txt on fridays. EXAMS ON EVEN CYCLES     CA 125: 162.2    05/20/2018:   Early PCE due to scheduling, due 6/21    05/28/2018  Delay C3 due to low ANC     CA 125:  25    06/04/2018  C3 weekly taxol 80 mg/m2 D1,8,15, Carboplatin AUC 6 D1 q 21 days. EXAMS ON EVEN CYCLES.      CA 125    06/25/2018:   HOLD chemo due to low anc (1047)- defer to next week, and change to q 28 days cycle to build in an off week to allow for counts to recover     CA 125: 10.6    07/02/2018:   C4 weekly taxol 80 mg/m2 D1,8,15, Carboplatin AUC 6 D1 q 28 days. EXAMS ON EVEN CYCLES.  Review her genetics testing results      CA 125 7.2    07/30/2018:  C5 weekly taxol 80 mg/m2 D1,8,15, Carboplatin AUC 6 D1 q 28 days. Neulasta Day 16      EXAMS ON EVEN CYCLES.      CA 125: 11.4    08/27/2018:   C6 weekly taxol 80 mg/m2 D1,8,15, Carboplatin AUC 6 D1 q 28 days. Neulasta Day 16. Order Ct CAP. RTC 10/11 (3 weeks)     EXAMS ON EVEN CYCLES. Discussed option of maintenance parp I- pt not decided (doesn't want to feel tired anymore)     CA 125: 8.7       05/28/2018 Tumor Markers    Tumor markers tested:  CA 125.  Results:   25.     06/25/2018 Tumor Markers    Tumor markers tested:  CA 125.  Results:   10.6.     07/02/2018 Tumor Markers    Tumor markers tested:  CA 125.  Results:   7.2.     07/07/2018 Hospital Admission    Admit date: 7/31 - 07/13/18  Admission diagnosis: Partial SBO  Additional comments: On 7/31 she experienced abdominal pain at home that was sharp on her LLQ and had significant diarrhea and one episode of emesis. She came to be evaluated at the [facility] clinic. She has a history of prior small bowel obstruction. She was give IL NS, 2mg  morphine IV, and 4mg  Zofran IV in clinic and admitted for further observation. The abdominal XR showed dilated bowel loops, concerning for obstruction. CT abdomen/pelvis showed dilation  of the small bowel with transition point seen in the anterior mid abdomen near the midline.     07/07/2018 Imaging    Imaging Completed:  X-ray of  abdomen  Result: Nonspecific bowel gas pattern. The presence of multiple air-fluid levels in small bowel raises question of early or partial small  bowel obstruction.     07/07/2018 Imaging    CTAP:Small bowel obstruction with transition point in the anterior mid abdomen near the midline. Small amount of free fluid in the abdomen, which is nonspecific. 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  Peritoneum: Small amount of free fluid in the abdomen. A para-aortic density with central low attenuation measures 2.5 x 2 cm and is significantly decreased in size from prior.       07/30/2018 Tumor Markers    Tumor markers tested:  CA 125.  Results:   11.4.     08/27/2018 Tumor Markers    Tumor markers tested:  CA 125.  Results:   8.7.     09/20/2018 Procedure    Port removed: While the skin surface appeared inflamed, there was no evidence of infection in the port reservoir.       09/22/2018 Imaging    CT Chest-No evidence of thoracic metastatic disease.  Suspected small seroma in the base of the right neck.  Necrotic metastatic lesion could also have this appearance but considered less likely.    [name]- Interval decrease in size of a left periaortic necrotic lymph node. No evidence of new metastatic disease.     10/15/2018 Imaging    Imaging Completed:  PET scan of  whole body  Result: Resolution of the previous hypermetabolic para-aortic lymph nodes and hepatic dome lesions.  Hypermetabolic focus within the left thyroid lobe.  Consider further evaluation with ultrasound, if clinically indicated.       10/19/2018 Imaging    Imaging Completed:  Ultrasound of  neck  Result: 1.2 cm irregular hypoechoic nodule in the left thyroid lobe is suspicious. Recommend FNA for further evaluation.  1.0 cm nodule right thyroid lobe containing macrocalcification is indeterminate. Recommend follow-up thyroid ultrasound in one year to evaluate for stability.     10/21/2018 Tumor Markers    Tumor markers tested:  CA 125.  Results:   4.8.     11/12/2018 Tumor Markers    Tumor markers tested:  CA 125  Results:   5.5      11/12/2018 - 12/03/2018 Systemic Therapy (Oral and IV)    11/12/2018: C1 rubraca 600 mg po BID. RTC 2 weeks for labs (CBC, CMP, 4 weeks for PCE #2). Pt plans to go to [place] and may leave before PCE and re-est care at [facility] (Dr. Gerilyn Pilgrim). Referral to ENT placed (pt cancelled appt for 11/22) and FNA was prev ordered for thyroid nodule- but has not been done.        03/02/2019 Imaging    CT Scan from [facility]    No evidence for recurrent or new neoplasm. Increased colonic stool could indicate constipation. Postoperative changes     06/13/2019 Tumor Markers    Tumor markers tested: CA 125 Results:   5.0.     09/15/2019 Tumor Markers    CA 125-6.1     05/31/2020 Tumor Markers    Ca 125-5.9     08/16/2020 Tumor Markers    CA 125-6.2     02/06/2021 Tumor Markers    CA 125-4.6  08/25/2022 Imaging    CT a/p: 2.6 cm lesion along the dome of the right hepatic lobe may represent a peritoneal lesion along the undersurface of the diaphragm rather than a true hepatic parenchymal lesion. 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     CT chest: No evidence of intrathoracic metastatic disease.      08/29/2022 Imaging    PET: 1.  Two FDG avid foci at the superior surface of right hepatic lobe, favored to be peritoneal nodules along the undersurface of right hemidiaphragm, concerning for metastases.      09/15/2022 Procedure    09/15/2022:   scheduled for Power Port Insertion with Dr. Andrey Campanile      09/16/2022 -  Systemic Therapy (Oral and IV)    09/16/2022:   C1 carboplatin AUC4, gem 1000 mg/m2 (D1/8), Bev D1 q 21 days  Treatment Summary   Treatment goal Disease control   Plan Name OP Gyn CARBOplatin AUC 4 / Gemcitabine / Bevacizumab   Status Active   Start Date 09/16/2022   End Date 12/05/2022 (Planned)   Provider Mikle Bosworth, MD   Chemotherapy bevacizumab (AVASTIN) 850 mg in sodium chloride 0.9 % 100 mL chemo infusion, 15  mg/kg = 850 mg, Intravenous, Once, 2 of 4 cycles  Administration: 850 mg (09/16/2022)  gemcitabine (GEMZAR) 1,600 mg in sodium chloride 0.9 % 100 mL chemo infusion, 1,000 mg/m2 = 1,600 mg, Intravenous, Once, 2 of 4 cycles  Administration: 1,600 mg (09/16/2022), 1,600 mg (09/23/2022)            09/16/2022 Tumor Markers    Tumor markers tested:  CA 125  Results:   7.5     10/07/2022 Tumor Markers    CA125: 7.9           Past Medical History  She  has a past medical history of Bowel obstruction (CMS-HCC) (2018), Ovarian cancer (CMS-HCC) (2014), and Thyroid disease.  Past Medical History:   Diagnosis Date    Bowel obstruction (CMS-HCC) 2018    Ovarian cancer (CMS-HCC) 2014    Thyroid disease        Past Surgical History  Past Surgical History:   Procedure Laterality Date    ABDOMINOPLASTY  1999    BLEPHAROPLASTY  2000    DILATION AND CURETTAGE OF UTERUS  1979    ELBOW SURGERY  1990    EYE SURGERY  1994    INSERTION CATHETER VASCULAR ACCESS N/A 09/15/2022    Procedure: INSERTION POWER PORT A CATH;  Surgeon: Garrel Ridgel, MD;  Location: [facility] OR;  Service: General;  Laterality: N/A;  Left Subclavian    KNEE ARTHROPLASTY Right     ROTATOR CUFF REPAIR      TRANSUMBILICAL AUGMENTATION MAMMAPLASTY      VAGINAL HYSTERECTOMY  1979       Family History  History reviewed. No pertinent family history.    Family cancer history for ovarian, uterine and colon cancer is negative other than above.       Social History  Social History     Socioeconomic History    Marital status: Married     Spouse name: None    Number of children: None    Years of education: None    Highest education level: None   Occupational History    None   Tobacco Use    Smoking status: Some Days     Packs/day: 0.00  Types: Cigarettes    Smokeless tobacco: Never    Tobacco comments:     Smokes 1 pack every three days    Vaping Use    Vaping Use: Never used   Substance and Sexual Activity    Alcohol use: Yes     Alcohol/week: 14.0 standard drinks     Types: 14  Shots of liquor per week    Drug use: No    Sexual activity: Not Currently   Other Topics Concern    Caffeine Use Yes    Occupational Exposure No    Exercise Yes    Seat Belt Yes   Social History Narrative    Mammogram-6 years ago and it was normal. Pt stated she isn't getting them anymore.      Colonoscopy-10 years ago        Pt reports she can lie flat in bed without SOB.    Pt states she can walk a flight of stairs and city block without chest pain and SOB>       Social Determinants of Health     Financial Resource Strain: Not on file   Physical Activity: Not on file   Stress: Not on file   Social Connections: Not on file   Housing Stability: Not on file       Past OB/GYN History  G3P2  She reports menarche at age 89 and menopause at age 73-surgical.  She denies a history of STIs.  She denies a history of abnormal cervical cytology and reports her last cytologic examination she is unsure of. She has taken HRT.     Health maintenance:  Mammogram: Date 6 years ago Results normal  Colonoscopy: Date 10 years ago Results normal    Allergies  She is allergic to nsaids (non-steroidal anti-inflammatory drug), sulfamethoxazole-trimethoprim, celecoxib, and metoclopramide.    BMI  Body mass index is 21.25 kg/m.    Disease Status: No evidence of disease/remission (08/21/2022 12:27 PM)              Histories  She has a past medical history of Bowel obstruction (CMS-HCC) (2018), Ovarian cancer (CMS-HCC) (2014), and Thyroid disease.    She has a past surgical history that includes Dilation and curettage of uterus (1979); Vaginal hysterectomy (1979); Elbow surgery (1990); Eye surgery (1994); Abdominoplasty (1999); Blepharoplasty (2000); Transumbilical augmentation mammaplasty; Rotator cuff repair; Knee Arthroplasty (Right); and insertion catheter vascular access (N/A, 09/15/2022).    Her family history is not on file.    She reports that she has been smoking cigarettes. She has never used smokeless tobacco. She reports current  alcohol use of about 14.0 standard drinks per week. She reports that she does not use drugs.    Allergies  Nsaids (non-steroidal anti-inflammatory drug), Sulfamethoxazole-trimethoprim, Celecoxib, and Metoclopramide    Medications  Outpatient Encounter Medications as of 10/16/2022   Medication Sig Dispense Refill    ascorbic acid, vitamin C, (VITAMIN C) 1000 MG tablet Take 1 tablet (1,000 mg total) by mouth daily.      cholecalciferol, vitamin D3, (VITAMIN D3) 1000 units tablet Take 1 tablet (1,000 Units total) by mouth daily. 30 tablet 0    dexAMETHasone (DECADRON) 4 MG tablet Take 8 mg by mouth daily with food on day 2, 3, and 4 of chemotherapy cycle 18 tablet 4    GLUTATHIONE-L ORAL Take 1 teaspoonful by mouth daily.      IODINE ORAL Take 50 mg/day by mouth.      Lactobac no.41/Bifidobact no.7 (PROBIOTIC-10  ORAL) Take 1 capsule by mouth daily. Garden of [employer] brand      levothyroxine (SYNTHROID) 75 MCG tablet Take 1 tablet (75 mcg total) by mouth every morning before breakfast.      lisinopriL (PRINIVIL) 10 MG tablet Take 1 tablet (10 mg total) by mouth daily. 30 tablet 3    omeprazole (PRILOSEC OTC) 20 MG tablet Take 1 tablet (20 mg total) by mouth every morning before breakfast. 20mg  twice a day      ubidecarenone (COENZYME Q10 ORAL) Take 1 tablet by mouth daily.      UNABLE TO FIND Take 30 drops by mouth daily. Multimineral      UNABLE TO FIND Take 20 drops by mouth daily. Med Name: Lurena Joiner Drops      UNABLE TO FIND Take 2 capsules by mouth daily. Med Name: Body Bio      vit b complex w-b 12 tablet Take 1 tablet by mouth daily.      ZINC CITRATE ORAL Take 1 tablet by mouth daily.       No facility-administered encounter medications on file as of 10/16/2022.        Review of Systemssee HPI    Vitals  Blood pressure (!) 149/91, pulse 81, resp. rate 15, height 5' 4 (1.626 m), weight 123 lb 12.8 oz (56.2 kg), SpO2 99 %.    Physical Exam   Vitals reviewed.  Constitutional: She is oriented to person, place, and time. She  appears well-developed.   HENT:   Head: Atraumatic.   Nose: Nose normal.   Neck: No thyromegaly present.   Cardiovascular: Normal rate.   Pulmonary/Chest: Effort normal.   Abdominal: Normal appearance. She exhibits no distension.   Genitourinary:    Genitourinary Comments: def     Musculoskeletal:         General: Normal range of motion.      Cervical back: Normal range of motion.   Lymphadenopathy:     She has no cervical adenopathy.   Neurological: She is alert and oriented to person, place, and time.   Skin: Skin is warm and dry.   Psychiatric: Her behavior is normal.        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     Posterior cul de sac biopsy-metastatic adenocarcinoma.     Omentectomy- metastatic adenocarcinoma     LND-negative     Residual right IP ligament, Anterior cul de sac biopsy, right pelvic side will biopsy, left pelvic side wall, right gutter and left gutter-->benign    04/06/2017:  CT [name]- mild small bowel obstruction with transition point in the left pelvis adjacent to the anterior abdominal wall. Low density in the common femoral veins bilaterally thought likely to represent flow artificat, may consider follow up with lomer extremity Doppler to ensure there  is no underlying deep venous thrombosis.    01/29/2018:   CTAP:  Interval development of necrotic retroperitoneal lymphadenopathy with largest node measuring 4.9  4.2 x 4.8 cm. Origin is indeterminate.  Options would include CT directed biopsy as well as PET/CT.    02/18/2018:   CA 125:  197.8    02/26/2018:   CT guided Left retroperitoneal node biopsy- focally involved by non-small cell malignancy- additional stains are diagnostic of non-small cell carcionoma and supportive of involve78ment by the patients reported previous known ovarian malignancy.    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  No other focal areas of FD avid malignancy are found. Specifically, no lymph node activity is found outside of the retroperitoneum and there is no evidence for FDG avid pulmonary, skeletal or hepatic parenchymal metastasis.    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 No retroperitoneal, intra-abdominal, pelvic or inguinal adenopathy, no evidence for peritoneal surface neoplastic involvement. No evidence for recurrent or new neoplasm    Cancer Staging:   Cancer Staging   Malignant neoplasm of ovary (CMS-HCC)  Staging form: Ovary, Fallopian Tube, And Primary Peritoneal Carcinoma, AJCC 8th Edition  - Clinical: FIGO Stage IIIC - Signed by Mikle Bosworth, MD on 04/01/2018      Disease Status: No evidence of disease/remission (08/21/2022 12:27 PM)               Assessment & Plan  My impression is Ms. 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 She completed 6 cycles of chemotherapy with Carboplatin and Paclitaxel-weekly (C6D1 was 08/27/18). CA 125 was 8.7 at C6.     She underwent CT CAP on 09/22/2018 revealed ned in chest and interval decrease in size of a left periaortic necrotic lymph node from 1.8 to 1.6 cm. 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     PET on 10/15/18 shows reassuring intraabdominal findings with resolution of the previous hypermetabolic para-aortic lymph nodes and hepatic dome lesions, but did note uptake within the thyroid. She underwent thyroid US on 10/20/18 with a 1.2 cm irregular hypoechoic nodule in the left thyroid lobe is suspicious. Recommend FNA for further evaluation.     She initiated maintenance rubraca 11/2018 but ultimately elected to stop in 02/2019 due to side effects.   Patient had reestarted drug at 500 mg po BID once she got down to [place]-. Was in [place] from 12/2018-05/2019. Reports her oncologist down in [place] had changed doses of Rubraca a few times, to 300mg  po BID but decided to stop drug all together back in 02/2019 2/2 to decreased QOL. She does not wish to restart drug at this time. Last CT scan 03/02/2019 in [place], no  evidence of recurrent or new neoplasm.     She was seen for surveillance 08/21/2022 with an increase in her CA 125 to 8.5 - CT CAP and PET were checked and consistent with platinum sensitive recurrence #2.     At this time, she has decided to proceed with chemotherapy.    She is seen today for C2 of Carboplatin/Gemcitabine/bevacizumab after 1 week delay for neutropenia. Neulasta being added to D9      [name] is tolerating chemotherapy appropriately well, and has experienced expected but manageable side effects. Her counts were reviewed and are appropriate for therapy. There is no evidence of progressive disease today, and we will continue treatment  without dose modification. We will see her back for her next cycle of treatment, and she knows to call if questions or problems arise.    10/16/2022:   C2 carboplatin AUC4, gem 1000 mg/m2 (D1/8), Bev D1 q 21 days. 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 Neulasta D9 (started C2), RTC 3 weeks 11/30 C3  CA 125: 7.9    __ RTC in 3 wks for C3  __Neulasta added to D9 C2  __Lisinopril added for HTN 10/07/2022    Thyroid Nodule:   Incidental finding on PET scan of hypermetabolic focus within left thyroid nodule. Ultrasound performed on 10/20/18 with 1.2 cm irregular hypoechoic nodule, appearing suspicious. Recommend FNA for further evaluation, which was ordered. Pt has not followed up to date.  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 Referral to ENT made, but patient did not see them. She followed up with her PCP and was started on Synthroid. Last TSH 5.91. Repeat TSH/T4/T3 labs wnl 11/2020     Genetics:  previously discussed her negative genetic testing results.    Pain    Pain Score: Zero (10/16/2022 10:41 AM)    She denies presence of pain.  Pain will be reassessed at next visit.         The HPI, ROS, and test results were reviewed and copied forward (with edits) from a note written by Dr. [name] on 10/07/22. I have reviewed and updated the history, physical exam, data, assessment, and plan of the note so that it reflects my evaluation and management of the patient.    Glenice Bow, CNP  Division of Gynecologic Oncology  [phone]    I saw and personally examined the patient today . I discussed the findings and therapeutic plan with the patient. I repeated, reviewed and agree with the history of present illness, past medical histories, family history, social history, medication list, and allergies as listed. The review of systems is as noted above. My physical exam confirms the findings listed above. Review of labs, pathology reports, radiograph reports, and medical records  confirm the findings noted above. I agree with the assessment and plan as noted above. I have edited the note where appropriate.     I have personally performed a face to face diagnostic evaluation on this patient, seen initially by the CNP. I have personally developed the care plan as outlined in the Assessment and Plan.     This note was completely edited, written and reviewed by me and consists of information cut and pasted from the my most recent visit, my smart phrases and other Epic tools. I have personally reviewed all aspects of this note to at least include reviewing this patient's chart and problem list, updating the history, physical exam, lab and procedure results, and assessment and  plan as detailed above and below. As such this visit note reflects my current evaluation and management for this patient.    This note was copied forward from the note written by me on 10/07/2022. I have reviewed and updated the history, physical exam, data, assessment and plan of the note so that it reflects the evaluation and management of the patient on 10/16/2022       Complexity: high     I spent a total of 45 minutes was spent prior to, during and after the patient encounter in counseling and/or coordination of care, documentation and counseling with patient and/or family.      Topics discussed include ovarian  Cancer prognosis and treatment options and management of chemo, neutropenia.10/16/2022:   C2 carboplatin AUC4, gem 1000 mg/m2 (D1/8), Bev D1 q 21 days. 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 Neulasta D9 (started C2), RTC 3 weeks 11/30 C3  CA 125: 7.9      Maggie Font, MD, FACOG  Associate Professor, Obstetrics and Gynecology  Division of Gynecologic Oncology        Medical Decision Making:  The following items were considered in medical decision making:  Review / order clinical lab tests  Review / order radiology tests  Review / order other diagnostic tests/interventions

## 2022-10-17 NOTE — Unmapped (Signed)
Contacted patient to review treatment plan and eval supplements she is taking.  Reviewed patients D2,3,4 Dexamethasone daily to start today. Patient verbalizes understanding and that she has medication.    Discussed growth factor and patient can receive On-Pro if insurance approves; Neulasta if not approved.  Patient states she had received prior treatment and did well with either option.   Confirmed supplements that patient is currently taking: Body Bio supplement - a phospholipid for memory and a product called Bres CSA PP Extra 20 drops daily for immune system health. Informed patient that our pharmacist will review to determine if these are a concern with her current treatment plan.   Provided information to Bretta BangLisa Grate, PharmD. Patient states she feels well today.  No further questions at this time.

## 2022-10-19 ENCOUNTER — Inpatient Hospital Stay
Admission: EM | Admit: 2022-10-19 | Discharge: 2022-10-20 | Disposition: A | Discharge status: Home / Self Care | Payer: Medicare (Managed Care)

## 2022-10-19 ENCOUNTER — Emergency Department: Admit: 2022-10-19 | Payer: Medicare (Managed Care)

## 2022-10-19 DIAGNOSIS — I82C12 Acute embolism and thrombosis of left internal jugular vein: Principal | ICD-10-CM

## 2022-10-19 DIAGNOSIS — I10 Essential (primary) hypertension: Secondary | ICD-10-CM

## 2022-10-19 DIAGNOSIS — I82612 Acute embolism and thrombosis of superficial veins of left upper extremity: Secondary | ICD-10-CM

## 2022-10-19 LAB — DIFFERENTIAL
Basophils Absolute: 10 /uL (ref 0–200)
Basophils Relative: 0.1 % (ref 0.0–1.0)
Eosinophils Absolute: 0 /uL (ref 15–500)
Eosinophils Relative: 0 % (ref 0.0–8.0)
Lymphocytes Absolute: 509 /uL (ref 850–3900)
Lymphocytes Relative: 5.3 % (ref 15.0–45.0)
Monocytes Absolute: 240 /uL (ref 200–950)
Monocytes Relative: 2.5 % (ref 0.0–12.0)
Neutrophils Absolute: 8842 /uL (ref 1500–7800)
Neutrophils Relative: 92.1 % (ref 40.0–80.0)
nRBC: 0 /100 WBC (ref 0–0)

## 2022-10-19 LAB — BASIC METABOLIC PANEL
Anion Gap: 7 mmol/L (ref 3–16)
BUN: 25 mg/dL (ref 7–25)
CO2: 28 mmol/L (ref 21–33)
Calcium: 8.9 mg/dL (ref 8.6–10.3)
Chloride: 96 mmol/L (ref 98–110)
Creatinine: 0.66 mg/dL (ref 0.60–1.30)
EGFR: 88
Glucose: 181 mg/dL (ref 70–100)
Osmolality, Calculated: 281 mOsm/kg (ref 278–305)
Potassium: 4.4 mmol/L (ref 3.5–5.3)
Sodium: 131 mmol/L (ref 133–146)

## 2022-10-19 LAB — ANTIBODY SCREEN: Antibody Screen: NEGATIVE

## 2022-10-19 LAB — CBC
Hematocrit: 31 % — ABNORMAL LOW (ref 35.0–45.0)
Hemoglobin: 10.9 g/dL — ABNORMAL LOW (ref 11.7–15.5)
MCH: 35.4 pg — ABNORMAL HIGH (ref 27.0–33.0)
MCHC: 35.1 g/dL (ref 32.0–36.0)
MCV: 100.9 fL — ABNORMAL HIGH (ref 80.0–100.0)
MPV: 8 fL (ref 7.5–11.5)
Platelets: 238 10E3/uL (ref 140–400)
RBC: 3.07 10E6/uL — ABNORMAL LOW (ref 3.80–5.10)
RDW: 13.9 % (ref 11.0–15.0)
WBC: 9.6 10E3/uL (ref 3.8–10.8)

## 2022-10-19 LAB — PROTIME-INR
INR: 0.9 (ref 0.9–1.1)
Protime: 12.9 s (ref 12.1–15.1)

## 2022-10-19 LAB — ABO/RH: Rh Type: POSITIVE

## 2022-10-19 LAB — APTT: aPTT: 21.8 seconds (ref 25.5–35.0)

## 2022-10-19 MED ORDER — OMNIPAQUE (iohexol) 350 mg iodine/mL 150 mL
350 | Freq: Once | INTRAVENOUS | Status: AC | PRN
Start: 2022-10-19 — End: 2022-10-19
  Administered 2022-10-19: 17:00:00 150 mL via INTRAVENOUS

## 2022-10-19 MED ORDER — heparin (porcine) injection 4,500 Units
5000 | Freq: Four times a day (QID) | INTRAMUSCULAR | PRN
Start: 2022-10-19 — End: 2022-10-20

## 2022-10-19 MED ORDER — sodium chloride 0.9 % infusion
INTRAVENOUS
Start: 2022-10-19 — End: 2022-10-19

## 2022-10-19 MED ORDER — heparin 25000 units in D5W 250 mL IV infusion
25000 | INTRAVENOUS
Start: 2022-10-19 — End: 2022-10-20
  Administered 2022-10-19: 21:00:00 18 [IU]/kg/h via INTRAVENOUS

## 2022-10-19 MED ORDER — lisinopriL (PRINIVIL) tablet 10 mg
10 | Freq: Every day | ORAL
Start: 2022-10-19 — End: 2022-10-20
  Administered 2022-10-20: 15:00:00 10 mg via ORAL

## 2022-10-19 MED ORDER — pantoprazole (PROTONIX) EC tablet 40 mg
40 | Freq: Every day | ORAL
Start: 2022-10-19 — End: 2022-10-20
  Administered 2022-10-20: 11:00:00 40 mg via ORAL

## 2022-10-19 MED ORDER — levothyroxine (SYNTHROID) tablet 75 mcg
25 | Freq: Every morning | ORAL
Start: 2022-10-19 — End: 2022-10-20
  Administered 2022-10-20: 11:00:00 75 ug via ORAL

## 2022-10-19 MED ORDER — heparin (porcine) injection 2,000 Units
5000 | Freq: Four times a day (QID) | INTRAMUSCULAR | PRN
Start: 2022-10-19 — End: 2022-10-20
  Administered 2022-10-20: 06:00:00 2000 [IU]/kg via INTRAVENOUS

## 2022-10-19 MED FILL — HEPARIN (PORCINE) 25,000 UNIT/250 ML (100 UNIT/ML) IN DEXTROSE 5 % IV: 25000 25,000 unit/250 mL(100 unit/mL) | INTRAVENOUS | Qty: 250

## 2022-10-19 MED FILL — OMNIPAQUE 350 MG IODINE/ML INTRAVENOUS SOLUTION: 350 350 mg iodine/mL | INTRAVENOUS | Qty: 150

## 2022-10-19 NOTE — Unmapped (Signed)
Pt had her second round of chemo on Thursday. By Friday night she noticed her left arm swelling. Now, swelling travels up to her left side of her  neck and her left lateral chest, Pt denies pain. Of note, pt's Port A Cath is in her left chest. Pt denies redness or tenderness at the site

## 2022-10-19 NOTE — Unmapped (Signed)
[facility] ED Note    Date of Service: 10/19/2022  Reason for Visit: Arm Swelling      Patient History     HPI  Kelsey Sullivan is a 81 y.o. female with a history of ovarian cancer with mets, bowel obstruction, thyroid disease who presents to the ED for evaluation of arm swelling. States she has been experiencing progressively worsening left arm swelling since Friday. Noted this morning the swelling went up to the left side of her neck. Denies any pain or sensory changes to left arm. Does not it appears darker when compared to right arm. [facility] Patient had second round of chemo last week. Is being followed by gyn-onc. Denies any chest pain, shortness of breath, fevers/chills, abdominal pain.       Past Medical History:   Diagnosis Date    Bowel obstruction (CMS-HCC) 2018    Ovarian cancer (CMS-HCC) 2014    Thyroid disease      Past Surgical History:   Procedure Laterality Date    ABDOMINOPLASTY  1999    BLEPHAROPLASTY  2000    DILATION AND CURETTAGE OF UTERUS  1979    ELBOW SURGERY  1990    EYE SURGERY  1994    INSERTION CATHETER VASCULAR ACCESS N/A 09/15/2022    Procedure: INSERTION POWER PORT A CATH;  Surgeon: Garrel RidgelGregory C Wilson, MD;  Location: [facility] OR;  Service: General;  Laterality: N/A;  Left Subclavian    KNEE ARTHROPLASTY Right     ROTATOR CUFF REPAIR      TRANSUMBILICAL AUGMENTATION MAMMAPLASTY      VAGINAL HYSTERECTOMY  1979       Physical Exam     Vitals:    10/19/22 1335 10/19/22 1506 10/19/22 1707 10/19/22 1743   BP: 145/86 132/79 159/83    BP Location: Right upper arm Right upper arm Right upper arm    Patient Position: Lying Lying Sitting    BP Cuff Size: Regular Regular     Pulse: 72 63 68    Resp: 18 16 16     Temp:   98.2 F (36.8 C)    TempSrc:   Oral    SpO2: 97% 97% 94%    Weight:    122 lb (55.3 kg)   Height:    5' 4 (1.626 m)       General:  Well appearing. No acute distress, resting comfortably.  Eyes:  Pupils reactive. EOMI. No discharge from  eyes.  ENT: No oropharyngeal edema, tolerating oral secretions  Pulmonary:   Non-labored breathing. Breath sounds clear bilaterally.   Cardiac:  Regular rate and rhythm.   Abdomen:  Soft. Non-distended. Non-tender.   Musculoskeletal:  No long bone deformity. LUE and left neck swelling. No tenderness to palpation. LUE neurovascularly intact. Port site on left chest wall non-tender and non-erythematous.   Skin:  Dry, no rashes  Neuro:  Alert and oriented x4. CN II-XII intact. Moves all four extremities to command.     Diagnostic Studies     Labs:  Please see EMR for labs obtained during this patient encounter     Radiology:  Please see EMR for images obtained during this patient encounter       ED Course and MDM     Kelsey Sullivan is a 81 y.o. female with a history and presentation as described above in HPI.  The patient was evaluated by myself, the ED Attending Physician Dr.  Moellman, and R4 Dr. Trisha Mangle. All management and disposition plans were discussed and agreed upon.    Upon initial presentation, patient resting comfortably in bed in no acute distress. Physical examination remarkable for unilateral LUE swelling and left neck swelling. LUE neurovascularly intact. Concern for VTE. Called radiology to discuss proper CT to evaluate for both LUE and neck. 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 Additional thrombus of the left internal and external jugular veins to the level of the carotid bifurcation. There is collateral flow in the left neck through the anterior jugular vein.     Labs largely unremarkable. Contacted gyn-onc as patient is active with them. They evaluated patient and are admitting under Dr. Jean Rosenthal for further evaluation and management. Requested [facility] service be contacted given recent port placement with them. Spoke to  Rowena who will give recommendations directly to gyn-onc. Discussed anticoagulation with gyn-onc. They stated they would order heparin.     Patient admitted at this time.     Medications received during this ED visit:  Medications   levothyroxine (SYNTHROID) tablet 75 mcg (has no administration in time range)   lisinopriL (PRINIVIL) tablet 10 mg ( Oral Held Dose 10/27/22 0900)   pantoprazole (PROTONIX) EC tablet 40 mg (has no administration in time range)   heparin (porcine) injection 4,500 Units (has no administration in time range)     And   heparin (porcine) injection 2,000 Units (has no administration in time range)     And   heparin 16109 units in D5W 250 mL IV infusion (18 Units/kg/hr  56.2 kg Intravenous New Bag 10/19/22 1549)   OMNIPAQUE (iohexol) 350 mg iodine/mL 150 mL (150 mLs Intravenous Given 10/19/22 1144)         Medical Decision Making  Problems Addressed:  Superficial venous thrombosis of arm, left: complicated acute illness or injury    Amount and/or Complexity of Data Reviewed  Labs: ordered.  Radiology: ordered.    Risk  Decision regarding hospitalization.         Impression     1. Superficial venous thrombosis of arm, left         Plan     Admit to Gyn-Onc (Dr Jean Rosenthal) under floor status for ongoing management of SVT/DVT         Kelsey Sullivan, [facility]  [facility]       Latanya Presser, DPM  Resident  10/19/22 [phone]

## 2022-10-19 NOTE — Unmapped (Signed)
Please See Consult Note with same time stamp

## 2022-10-19 NOTE — Unmapped (Signed)
General Surgery History and Physical/Consultation Note    Patient: Kelsey Sullivan  MRN: [id number]  CSN: [id number]    Requesting Physician: Hurshel Party    History     CC: Venous thrombosis     HPI: Kelsey Sullivan is a 81 y.o. female who had a port placed by the [facility] service 09/15/2022. Presented to ED today with complaints of significant swelling in her left arm/neck.     CT in ED consistent with complete thrombosis of brachiocephalic, subclavian, axillary, IJ, and EJ on the left. [facility] consulted regarding possible port removal or recommendations for management.     PMH:  Past Medical History:   Diagnosis Date    Bowel obstruction (CMS-HCC) 2018    Ovarian cancer (CMS-HCC) 2014    Thyroid disease        PSH:  Past Surgical History:   Procedure Laterality Date    ABDOMINOPLASTY  1999    BLEPHAROPLASTY  2000    DILATION AND CURETTAGE OF UTERUS  1979    ELBOW SURGERY  1990    EYE SURGERY  1994    INSERTION CATHETER VASCULAR ACCESS N/A 09/15/2022    Procedure: INSERTION POWER PORT A CATH;  Surgeon: Garrel Ridgel, MD;  Location: [facility] OR;  Service: General;  Laterality: N/A;  Left Subclavian    KNEE ARTHROPLASTY Right     ROTATOR CUFF REPAIR      TRANSUMBILICAL AUGMENTATION MAMMAPLASTY      VAGINAL HYSTERECTOMY  1979       Medications:  No current facility-administered medications on file prior to encounter.     Current Outpatient Medications on File Prior to Encounter   Medication Sig Dispense Refill    ascorbic acid, vitamin C, (VITAMIN C) 1000 MG tablet Take 1 tablet (1,000 mg total) by mouth daily.      cholecalciferol, vitamin D3, (VITAMIN D3) 1000 units tablet Take 1 tablet (1,000 Units total) by mouth daily. 30 tablet 0    dexAMETHasone (DECADRON) 4 MG tablet Take 8 mg by mouth daily with food on day 2, 3, and 4 of chemotherapy cycle 18 tablet 4    GLUTATHIONE-L ORAL Take 1 teaspoonful by mouth daily.      IODINE ORAL Take 50 mg/day by mouth.      Lactobac no.41/Bifidobact no.7 (PROBIOTIC-10 ORAL) Take 1  capsule by mouth daily. Garden [employer] brand      levothyroxine (SYNTHROID) 75 MCG tablet Take 1 tablet (75 mcg total) by mouth every morning before breakfast.      lisinopriL (PRINIVIL) 10 MG tablet Take 1 tablet (10 mg total) by mouth daily. 30 tablet 3    omeprazole (PRILOSEC OTC) 20 MG tablet Take 1 tablet (20 mg total) by mouth every morning before breakfast. 20mg  twice a day      ubidecarenone (COENZYME Q10 ORAL) Take 1 tablet by mouth daily.      UNABLE TO FIND Take 30 drops by mouth daily. Multimineral      UNABLE TO FIND Take 20 drops by mouth daily. Med Name: Lurena Joiner Drops      UNABLE TO FIND Take 2 capsules by mouth daily. Med Name: Body Bio      vit b complex w-b 12 tablet Take 1 tablet by mouth daily.      ZINC CITRATE ORAL Take 1 tablet by mouth daily.         Allergies:  Nsaids (non-steroidal anti-inflammatory drug), Sulfamethoxazole-trimethoprim, Celecoxib, and Metoclopramide    SH:  Social History  Socioeconomic History    Marital status: Married     Spouse name: Not on file    Number of children: Not on file    Years of education: Not on file    Highest education level: Not on file   Occupational History    Not on file   Tobacco Use    Smoking status: Some Days     Packs/day: 0     Types: Cigarettes    Smokeless tobacco: Never    Tobacco comments:     Smokes 1 pack every three days    Vaping Use    Vaping Use: Never used   Substance and Sexual Activity    Alcohol use: Yes     Alcohol/week: 14.0 standard drinks of alcohol     Types: 14 Shots of liquor per week    Drug use: No    Sexual activity: Not Currently   Other Topics Concern    Caffeine Use Yes    Occupational Exposure No    Exercise Yes    Seat Belt Yes   Social History Narrative    Mammogram-6 years ago and it was normal. Pt stated she isn't getting them anymore.      Colonoscopy-10 years ago        Pt reports she can lie flat in bed without SOB.    Pt states she can walk a flight of stairs and city block without chest pain and SOB>        Social Determinants of Health     Financial Resource Strain: Not on file   Food Insecurity: No Food Insecurity (10/19/2022)    Hunger Vital Sign     Worried About Running Out of Food in the Last Year: Never true     Ran Out of Food in the Last Year: Never true   Transportation Needs: No Transportation Needs (10/19/2022)    PRAPARE - Therapist, art (Medical): No     Lack of Transportation (Non-Medical): No   Physical Activity: Not on file   Stress: Not on file   Social Connections: Not on file   Intimate Partner Violence: Not At Risk (10/19/2022)    Humiliation, Afraid, Rape, and Kick questionnaire     Fear of Current or Ex-Partner: No     Emotionally Abused: No     Physically Abused: No     Sexually Abused: No   Housing Stability: Low Risk  (10/19/2022)    Housing Stability Vital Sign     Unable to Pay for Housing in the Last Year: No     Number of Places Lived in the Last Year: 1     Unstable Housing in the Last Year: No       FH:   History reviewed. No pertinent family history.    Vital Signs     Temp:  [98.1 F (36.7 C)-98.2 F (36.8 C)] 98.2 F (36.8 C)  Heart Rate:  [63-82] 68  Resp:  [16-20] 16  BP: (132-165)/(79-94) 159/83  Vitals:    10/19/22 1707   BP: 159/83   Pulse: 68   Resp: 16   Temp: 98.2 F (36.8 C)   SpO2: 94%         Physical Exam     Physical Exam  Constitutional:       General: She is not in acute distress.  Cardiovascular:      Rate and Rhythm: Normal rate.   Pulmonary:  Effort: No respiratory distress.   Musculoskeletal:         General: Swelling present.      Comments: Diffuse unilateral LUE swelling   Skin:     General: Skin is warm and dry.   Neurological:      General: No focal deficit present.      Mental Status: She is alert.         Laboratory Data           Invalid input(s): CO2ART, HBO2PE      Lab 10/19/22  1020 10/16/22  1025   WBC 9.6 5.3   HEMOGLOBIN 10.9* 11.8   HEMATOCRIT 31.0* 34.2*   MEAN CORPUSCULAR VOLUME 100.9* 99.5   PLATELETS 238  367         Lab 10/19/22  1020 10/16/22  1025   SODIUM 131* 135   POTASSIUM 4.4 4.4   CHLORIDE 96* 98   CO2 28 28   BUN 25 11   CREATININE 0.66 0.72   GLUCOSE 181* 103*   CALCIUM 8.9 9.2   MAGNESIUM  --  1.8         Lab 10/19/22  1020   INR 0.9   PROTHROMBIN TIME 12.9         Lab 10/16/22  1025   ALT 17   AST 24   ALK PHOS 79   BILIRUBIN TOTAL 0.4   ALBUMIN 4.3               Lab 10/16/22  1025   COLOR, URINE Yellow   CLARITY Clear   PROTEIN UA Negative   PH UA 7.5   SPECIFIC GRAVITY, URINE 1.009   GLUCOSE UA Negative   BLOOD UA Negative   LEUKOCYTES UA Negative   NITRITE UA Negative   BILIRUBIN UA Negative   UROBILINOGEN UA <2.0           Imaging Studies     CT Chest With IV contrast    Result Date: 10/19/2022   EXAM: CT CHEST WITH IV CONTRAST INDICATION: Concern for blood clot; swelling from left IJ down entire LUE TECHNIQUE: CT angiogram of the upper chest was performed using delayed venous phase images as per the standard departmental SVC protocol.  CONTRAST: 150 mL of IOHEXOL 350 MG IODINE/ML INTRAVENOUS SOLUTION administered intravenously COMPARISON: CT chest (08/25/2022) and PET/CT (08/29/2022) Brachiocephalic confluence. FINDINGS:  SVC: Patent Left subclavian vein: Thrombosed. Left internal jugular vein: Thrombosed from the level of the carotid bifurcation level. The left external jugular veins is also thrombosed to a similar level. There is collateral flow through the anterior jugular vein. Left brachiocephalic vein: Thrombosed. The left axillary and included portions of the left cephalic, basilic, and brachial arteries are also thrombosed. Right subclavian vein: Patent Right internal jugular vein: Patent Right brachiocephalic vein: Patent Azygous vein: Patent Venous collaterals: No significant collaterals have developed. MEDICAL DEVICES: Left subclavian chest port terminates in the lower SVC. AIRWAYS & LUNGS: The central airways are patent. Bibasilar bandlike atelectasis/scarring. No focal lung consolidation.  PLEURA: No pleural effusions or pneumothorax. LOWER NECK: Unremarkable. HEART: The heart is normal in size. Minimal coronary artery calcifications. No significant pericardial effusion. VASCULAR STRUCTURES: Aorta and main pulmonary artery are normal in caliber. Scattered calcified atherosclerotic plaques throughout the aorta and its branches. MEDIASTINUM AND HILA: No pathologically enlarged lymph nodes. CHEST WALL AND AXILLA: Bilateral breast prosthesis. Mild inflammatory stranding in the left axilla. UPPER ABDOMEN: 2.5 cm lesion along the dome of the right hepatic  lobe is unchanged, better characterized on PET. OSSEOUS STRUCTURES: No suspicious osseous lesion or acute osseous abnormality.     IMPRESSION: 1.  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 Additional thrombus of the left internal and external jugular veins to the level of the carotid bifurcation. There is collateral flow in the left neck through the anterior jugular vein. 2.  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 3.  Approved by Ephriam Jenkins, MD on 10/19/2022 12:29 PM EST I have personally reviewed the images and I agree with this report. Report Verified by: Tobin Chad, MD at 10/19/2022 12:43 PM EST     Assessment and Plan   Natavia Sublette is a 81 y.o. female who recently had a L subclavian port placed by the [facility] service, presenting with diffuse venous thromboses in the LUE.     Plan:  - Agree with plan to start systemic anticoagulation  - Will discuss port removal if port non-functional or no-longer needed, will follow resolution of thromboses  - Will continue to follow, please reach out with any questions       Agustin Cree, MD  [facility]

## 2022-10-19 NOTE — Unmapped (Signed)
Gynecology Oncology History and Physical    Name: Kelsey Sullivan  MRN: [id number]  DOB: 09/30/1941    Chief complaint:   Chief Complaint   Patient presents with    Arm Swelling     HPI: Kelsey Sullivan is 81 y.o. 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 She has a PMH of chemotherapy induced hypertension, hypothyroidism, and thyroid nodule.    She reports she first noted swelling her left arm on Friday. Yesterday the swelling improved, but it got worse today so she decided to come in for evaluation. She has not associated pain and can still use her left arm. She denies any other complaints. Denies CP, SOB, nausea, vomiting, diarrhea, constipation, fever, chills, difficulty urinating, dysuria, abdominal pain. Her last BM was this morning though it was small. Her last BM was two days prior.     Regarding the patient's history of current disease, the patient first presented with pelvic pain in 2014. She underwent a pelvic ultrasound, which demonstrated a 9cm pelvic mass. She had previously had a vaginal hysterectomy for benign indications. She underwent BSO, omentectomy, lymph node dissection on 12/28/2012 with Dr. Domingo Cocking at [facility]. Pathology was consistent with stage IIIC high grade serous fallopian tube and STIC with mets to the left fallopian tube, ovary, omentum, and cul de sac peritoneum. The patient declined adjuvant therapy. She was diagnosed with platinum-sensitive recurrence in April 2019. She completed 6 cycles of chemotherapy with carboplatin and paclitaxel weekly (received 6th cycle 08/27/2018). Her CA 125 at diagnosis of recurrence was 197.8 and her CA  125 at C6 was 8.7. She initiated maintenance rubraca 11/2018, but ultimately elected to stop in March 2020 due to side effects. The patient was noted to have new increase in her CA 125 to 8.5 in September 2023 at the time of her surveillance visit. CT C/[name] and PET scan were completed and she was diagnosed with platinum sensitive recurrence #2. She is now C2D4 of carboplatin/gemcitabine/bevacizumab after 1 week delay for neutropenia.     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 Pt declined adjuvant therapy      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     Posterior cul de sac biopsy-metastatic adenocarcinoma.     Omentectomy- metastatic adenocarcinoma     LND-negative     Residual right IP ligament, Anterior cul de sac biopsy, right pelvic side will biopsy, left pelvic side wall,  right gutter and left gutter-->benign    04/06/2017:   CT [name]- mild small bowel obstruction with transition point in the left pelvis adjacent to the anterior abdominal wall. Low density in the common femoral veins bilaterally thought likely to represent flow artificat, may consider follow up with lomer extremity Doppler to ensure there is no underlying deep venous thrombosis.    01/29/2018:   CTAP:  Interval development of necrotic retroperitoneal lymphadenopathy with  largest node measuring 4.9  4.2 x 4.8 cm. Origin is indeterminate.  Options would include CT directed biopsy as well as PET/CT.    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  No other focal areas of FD avid malignancy are found. Specifically, no lymph node activity is found outside of the retroperitoneum and there is no evidence for FDG avid pulmonary, skeletal or hepatic parenchymal metastasis.    02/2018:   Patient saw Dr. Ishmael Holter who recommended Carbo/Taxol with an AUC of 6.  Dr. Maren Reamer office submitted foundation one testing. Patient would like Genetic Testing    04/12/2018:   IR port placed    04/16/2018:   C1 weekly taxol 80 mg/m2 D1,8,15, Carboplatin AUC 6 D1 q 21 days. PCEs on thurs, txt on fridays. EXAMS ON EVEN CYCLES     CA 125: 523.4     Genetic testing: Neg for CS mutation, no VUS, lifetime remaining breast cancer risk -3.4%    05/07/2018:   C2 weekly taxol 80 mg/m2 D1,8,15, Carboplatin AUC 6 D1 q 21 days. PCEs on thurs, txt on fridays. EXAMS ON EVEN CYCLES     CA 125: 162.2    05/20/2018:   Early PCE due to scheduling, due 6/21    05/28/2018  Delay C3 due to low ANC     CA 125: 25    06/04/2018  C3 weekly taxol 80 mg/m2 D1,8,15, Carboplatin AUC 6 D1 q 21 days. EXAMS ON EVEN CYCLES.      CA 125    06/25/2018:   HOLD chemo due to low anc (1047)- defer to next week, and change to q 28 days cycle to build in an off week to allow for counts to recover     CA 125: 10.6    07/02/2018:   C4 weekly taxol 80 mg/m2 D1,8,15, (missed day 5 due to admission) Carboplatin AUC 6 D1 q 28 days.  ANC still lowish 1480s- neulasta Day 16      EXAMS ON EVEN CYCLES.  Review her genetics testing results      CA 125 7.2    07/07/2018:   [facility] admission: SBO, neutropenic fever     CTAP:Small bowel obstruction with transition point in the anterior mid abdomen near the midline. Small amount of free fluid in the abdomen, which is nonspecific. 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     Peritoneum: Small amount of free fluid in the abdomen. A para-aortic density with central low attenuation measures 2.5 x 2 cm and is significantly decreased in size from prior.  07/16/2018:   CA 125: 28.5    07/30/2018:  C5 weekly taxol 80 mg/m2 D1,8,15, Carboplatin AUC 6 D1 q 28 days. Neulasta Day 16      EXAMS ON EVEN CYCLES.      CA 125: 11.4    08/27/2018:   C6 weekly taxol 80 mg/m2 D1,8,15, Carboplatin AUC 6 D1 q 28 days. Neulasta Day 16. Order Ct CAP. RTC 10/11 (3 weeks)     EXAMS ON EVEN CYCLES. Discussed option of maintenance parp I- pt not decided (doesn't want to feel tired anymore)     CA 125: 8.7    09/20/2018:   Port removed: While the skin surface appeared inflamed, there was no evidence of infection in the port reservoir.    09/22/2018:   CT Chest: No evidence of thoracic metastatic disease.     Suspected small seroma in the base of the right neck.  Necrotic metastatic lesion could also have this appearance but considered less likely.     CtAP: Interval decrease in size of a left periaortic necrotic lymph node. No evidence of new metastatic disease.     Lymphatics: Previously demonstrated left para-aortic lymph node with central necrosis currently measures 1.2 cm on series 5 image 45, previously measured 1.8 cm in the short axis.    10/05/2018:   Reviewed scans- aortic node has decreased in size uncertain if active cancer or just scarring. Check PET (to investigate of node is hypermetabolic as well as assess for liver implant previously noted on pet.  Offered maintenance with parp I- rubraca 600 mg BID. Pt interested-order placed thru spec pharmacy. RTC BC in a few weeks to start drug and review PET     10/15/2018:   PET: Resolution of the previous hypermetabolic para-aortic lymph nodes and hepatic dome lesions. Hypermetabolic focus within the left thyroid lobe.  Consider further evaluation with ultrasound, if clinically indicated.     NECK: There is a focus of uptake within the left thyroid lobe (axial image 62), with max SUV of 3.3    10/20/2018:   Thyroid US: 1.2 cm irregular hypoechoic nodule in the left thyroid lobe is suspicious. Recommend FNA for further evaluation.     1.0 cm nodule right thyroid lobe containing macrocalcification is indeterminate. Recommend follow-up thyroid ultrasound in one year to evaluate for stability.     Recommend= FNA for suspicious left thyroid nodule     10/21/2018:   Order thyroid FNA. Pt has not gotten her rubraca yet due to financial issues. RTC2 weeks for PCE#1 (11/12/18)     CA 125:   4.8    11/12/2018:   C1 rubraca 600 mg po BID. RTC 2 weeks for labs (CBC, CMP, 4 weeks for PCE #2). Pt plans to go to [place] and may leave before PCE and re-est care at [facility] (Dr. Gerilyn Pilgrim). Referral to ENT placed (pt cancelled appt for 11/22) and FNA was prev ordered for thyroid nodule- but has not been done.      CA 125: 5.5    11/18/2018:   Prob visit for symptoms from rubraca:For supportive meds, discussed recommendation for nausea control to include Zofran three times a day, 30 min before morning dose of PARP inhibitor, and 30 min before meals and Pepcid complete 2 x a day or prilosec. For insomnia, benadryl at night and see if it improves fatigue during the day.  Of note, she is not interested in future if needed to try Ritalin. Pt to call in 1 week to  review symptoms    11/26/18:   Pt calls to report she is doing better on above meds. Only complaint is sleeping too much. Advised she can cut the benadryl dose in half.     12/03/2018:   Pt  self d/c'd rubraca due to side effects (decreased energy mainly, some nausea)    12/10/2018:   Pt desires to start again on lower dose- rx to speciality pharmacy for rubraca 500 mg po BID. Pt headed to [facility]- explained she needs to have labs checked q 2 weeks initially and to be followed by oncologist (Dr. Bayard Males, [facility]) fax [phone]    02/2019:   Pt stops rubraca due to side effects, dose has been reduced per pt    03/02/2019:   CTAP: ([facility] [place]): NED. Partially calcified right thyroid nodule and hypodense 4 mm thyroid nodule    06/13/2019:   CA 125: 5. RTC 3 mos for CA 125, exam. Pt to go back to [place] in 09/2019. Recommended follow up for thyroid nodule    09/15/2019:   CA 125: 6.1. Pt to go back to [place] in 09/2019. Recommended follow up for thyroid nodule. Sent letter to Dr. Gerilyn Pilgrim in [place] fax [phone]    05/31/2020:   CA 125: 5.9    08/16/2020:   CA 125: 6.2, NED    11/15/2020:   CA 125: 5.9    06/24/2021:   CA 125: 6,. NED, plans to move to [place]    08/21/2022:   CA 125: 8.5, NED, offered CTs as she has had some GI issues recently- resolved w omeprazole     08/25/2022:   PS Recurrence #2: CT AP: 2.6 cm lesion along the dome of the right hepatic lobe may represent a peritoneal lesion along the undersurface of the diaphragm rather than a true hepatic parenchymal lesion. 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   Lymphatics: Previously demonstrated left para-aortic lymph node has resolved in the interim. No new or enlarging lymph nodes. Post surgical changes related to lymph node dissection.   Peritoneum/Retroperitoneum: Subtle thickening of the peritoneum in the paracolic gutters, left greater than right. Mild thickening of the peritoneum in the pelvis posteriorly.   Ct C: NED- Few scattered sub-4 mm pulmonary nodules are unchanged, likely benign, with no new suspicious or enlarging pulmonary nodules for  example 2 mm right lower lobe pulmonary nodule (series 5, image 224). . Nodular density related to the right hemidiaphragm on image 244 is related to an infradiaphragmatic peritoneal implant.   Plan PET to eval for FDG activity    08/29/2022:   PET:  Two FDG avid foci at the superior surface of right hepatic lobe, favored to be peritoneal nodules along the undersurface of right hemidiaphragm, concerning for metastases.      ABDOMEN AND PELVIS: *  Two FDG avid foci are seen along the surface of the right liver dome with Max SUV 9.9 on axial image 112 and max SUV 5.1 on axial image 108. *  No additional abnormal liver uptake.     9/28.2023:   FU- pet c/w recurrence- offer carbo gem bev- start 10/10 at [facility], surg onc ref for port placement     09/15/2022:   scheduled for Power Port Insertion with Dr. Andrey Campanile    09/16/2022:   C1 carboplatin AUC4, gem 1000 mg/m2 (D1/8), Bev D1 q 21 days. Watch BPs- prior to start of bev today Bps  elevated, may need to start in future   CA 125:  7.5    10/07/2022:   Early PCE- pt moving chemo back to [facility]- ANC 600s- delay chemo 1 week, add neulasta to D9, HTN due to bev- start lisinopril 10 mg      CA 125: 7.9    10/16/2022:   C2 carboplatin AUC4, gem 1000 mg/m2 (D1/8), Bev D1 q 21 days. 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 Neulasta D9 (started C2), RTC 3 weeks 11/30 C3  CA 125: 7.9    11/06/2022:   C3      Disposition: chemo, imaging after 3 cucles, CA 125, possible parp maintenance after, genetic testing neg, foundation one testing results from [facility] in [place]- this was not performed   Current disease status: Neg for CS mutation, no VUS, lifetime remaining breast cancer risk -3.4%  Genetics: negative for mutations, breast cancer lifetime risk 3.4%  Survivorship plan: pending completion of treatment            Past Medical History:   Diagnosis Date    Bowel obstruction (CMS-HCC) 2018    Ovarian cancer (CMS-HCC) 2014    Thyroid disease      Past Surgical History:   Procedure Laterality Date     ABDOMINOPLASTY  1999    BLEPHAROPLASTY  2000    DILATION AND CURETTAGE OF UTERUS  1979    ELBOW SURGERY  1990    EYE SURGERY  1994    INSERTION CATHETER VASCULAR ACCESS N/A 09/15/2022    Procedure: INSERTION POWER PORT A CATH;  Surgeon: Garrel Ridgel, MD;  Location: [facility] OR;  Service: General;  Laterality: N/A;  Left Subclavian    KNEE ARTHROPLASTY Right     ROTATOR CUFF REPAIR      TRANSUMBILICAL AUGMENTATION MAMMAPLASTY      VAGINAL HYSTERECTOMY  1979     Social History     Socioeconomic History    Marital status: Married     Spouse name: Not on file    Number of children: Not on file    Years of education: Not on file    Highest education level: Not on file   Occupational History    Not on file   Tobacco Use    Smoking status: Some Days     Packs/day: 0     Types: Cigarettes    Smokeless tobacco: Never    Tobacco comments:     Smokes 1 pack every three days    Vaping Use    Vaping Use: Never used   Substance and Sexual Activity    Alcohol use: Yes     Alcohol/week: 14.0 standard drinks of alcohol     Types: 14 Shots of liquor per week    Drug use: No    Sexual activity: Not Currently   Other Topics Concern    Caffeine Use Yes    Occupational Exposure No    Exercise Yes    Seat Belt Yes   Social History Narrative    Mammogram-6 years ago and it was normal. Pt stated she isn't getting them anymore.      Colonoscopy-10 years ago        Pt reports she can lie flat in bed without SOB.    Pt states she can walk a flight of stairs and city block without chest pain and SOB>       Social Determinants of Health     Food Insecurity: No Food Insecurity (  10/16/2022)    Yearly Questionnaire     Do you need any assistance with obtaining housing, meals, medication, transportation or medical equipment?: No     Assistance needed for:: Not on file   Transportation Needs: No Transportation Needs (10/16/2022)    Yearly Questionnaire     Do you need any assistance with obtaining housing, meals, medication, transportation or medical  equipment?: No     Assistance needed for:: Not on file   Intimate Partner Violence: Not on file   Housing Stability: Low Risk  (10/16/2022)    Yearly Questionnaire     Do you need any assistance with obtaining housing, meals, medication, transportation or medical equipment?: No     Assistance needed for:: Not on file     No family history on file.    Current Facility-Administered Medications   Medication Dose Frequency Provider Last Admin    heparin (porcine)  80 Units/kg Q6H PRN Durward Fortes, MD      And    heparin (porcine)  40 Units/kg Q6H PRN Durward Fortes, MD      And    heparin  18 Units/kg/hr Continuous Durward Fortes, MD 18 Units/kg/hr at 10/19/22 1549    [START ON 10/20/2022] levothyroxine  75 mcg QAM AC Durward Fortes, MD      [Held by provider] lisinopriL  10 mg Daily 0900 Durward Fortes, MD      [START ON 10/20/2022] pantoprazole  40 mg DAILY 0600 Durward Fortes, MD       Current Outpatient Medications   Medication Sig    ascorbic acid (vitamin C) Take 1 tablet (1,000 mg total) by mouth daily.    cholecalciferol (vitamin D3) Take 1 tablet (1,000 Units total) by mouth daily.    dexAMETHasone Take 8 mg by mouth daily with food on day 2, 3, and 4 of chemotherapy cycle    GLUTATHIONE-L ORAL Take 1 teaspoonful by mouth daily.    IODINE ORAL Take 50 mg/day by mouth.    Lactobac no.41/Bifidobact no.7 (PROBIOTIC-10 ORAL) Take 1 capsule by mouth daily. [employer] brand    levothyroxine Take 1 tablet (75 mcg total) by mouth every morning before breakfast.    lisinopriL Take 1 tablet (10 mg total) by mouth daily.    omeprazole Take 1 tablet (20 mg total) by mouth every morning before breakfast. 20mg  twice a day    ubidecarenone (COENZYME Q10 ORAL) Take 1 tablet by mouth daily.    UNABLE TO FIND Take 30 drops by mouth daily. Multimineral    UNABLE TO FIND Take 20 drops by mouth daily. Med Name: Lurena Joiner Drops    UNABLE TO FIND Take 2 capsules by mouth daily. Med Name: Body Bio    vit b complex  w-b 12 Take 1 tablet by mouth daily.    ZINC CITRATE ORAL Take 1 tablet by mouth daily.     Allergies   Allergen Reactions    Nsaids (Non-Steroidal Anti-Inflammatory Drug)      Stomach ache    Sulfamethoxazole-Trimethoprim Other (See Comments)     Hallucinations, anxiety  Other reaction(s): Unknown  loopy  hallucinations    Celecoxib Nausea And Vomiting    Metoclopramide Other (See Comments)     Does not remember     O:  Vitals:    10/19/22 0912 10/19/22 1335 10/19/22 1506   BP: (!) 165/94 145/86 132/79   BP Location: Right upper arm Right upper arm Right upper arm   Patient Position: Sitting Lying Lying  BP Cuff Size:  Regular Regular   Pulse: 82 72 63   Resp: 20 18 16    Temp: 98.1 F (36.7 C)     TempSrc: Oral     SpO2: 98% 97% 97%     Gen: NAD  CV: regular rate, well perfused  Resp: moving air well, no respiratory distress  Abd: soft, non-tender, non-distended  Ext: dusky appearing swollen left arm, palpable pulses, port site intact with no erythema or induration    Labs:  Recent Results (from the past 24 hour(s))   CBC    Collection Time: 10/19/22 10:20 AM   Result Value Ref Range    WBC 9.6 3.8 - 10.8 10E3/uL    RBC 3.07 (L) 3.80 - 5.10 10E6/uL    Hemoglobin 10.9 (L) 11.7 - 15.5 g/dL    Hematocrit 16.1 (L) 35.0 - 45.0 %    MCV 100.9 (H) 80.0 - 100.0 fL    MCH 35.4 (H) 27.0 - 33.0 pg    MCHC 35.1 32.0 - 36.0 g/dL    RDW 09.6 04.5 - 40.9 %    Platelets 238 140 - 400 10E3/uL    MPV 8.0 7.5 - 11.5 fL   Differential    Collection Time: 10/19/22 10:20 AM   Result Value Ref Range    Neutrophils Relative 92.1 (H) 40.0 - 80.0 %    Lymphocytes Relative 5.3 (L) 15.0 - 45.0 %    Monocytes Relative 2.5 0.0 - 12.0 %    Eosinophils Relative 0.0 0.0 - 8.0 %    Basophils Relative 0.1 0.0 - 1.0 %    nRBC 0 0 - 0 /100 WBC    Neutrophils Absolute 8,842 (H) 1,500 - 7,800 /uL    Lymphocytes Absolute 509 (L) 850 - 3,900 /uL    Monocytes Absolute 240 200 - 950 /uL    Eosinophils Absolute 0 (L) 15 - 500 /uL    Basophils Absolute 10  0 - 200 /uL   Basic metabolic panel    Collection Time: 10/19/22 10:20 AM   Result Value Ref Range    Sodium 131 (L) 133 - 146 mmol/L    Potassium 4.4 3.5 - 5.3 mmol/L    Chloride 96 (L) 98 - 110 mmol/L    CO2 28 21 - 33 mmol/L    Anion Gap 7 3 - 16 mmol/L    BUN 25 7 - 25 mg/dL    Creatinine 8.11 9.14 - 1.30 mg/dL    Glucose 782 (H) 70 - 100 mg/dL    Calcium 8.9 8.6 - 95.6 mg/dL    Osmolality, Calculated 281 278 - 305 mOsm/kg    EGFR 88    Protime-INR    Collection Time: 10/19/22 10:20 AM   Result Value Ref Range    Protime 12.9 12.1 - 15.1 seconds    INR 0.9 0.9 - 1.1   PTT, No Anticoagulant    Collection Time: 10/19/22 10:20 AM   Result Value Ref Range    aPTT 21.8 (L) 25.5 - 35.0 seconds     Imaging:  CT Chest 10/19/2022  FINDINGS:   SVC: Patent   Left subclavian vein: Thrombosed.   Left internal jugular vein: Thrombosed from the level of the carotid bifurcation level. The left external jugular veins is also thrombosed to a similar level. There is collateral flow through the anterior jugular vein.   Left brachiocephalic vein: Thrombosed. The left axillary and included portions of the left cephalic, basilic, and brachial arteries are also  thrombosed.   Right subclavian vein: Patent   Right internal jugular vein: Patent   Right brachiocephalic vein: Patent   Azygous vein: Patent   Venous collaterals: No significant collaterals have developed.   MEDICAL DEVICES: Left subclavian chest port terminates in the lower SVC.   AIRWAYS & LUNGS: The central airways are patent. Bibasilar bandlike atelectasis/scarring. No focal lung consolidation.   PLEURA: No pleural effusions or pneumothorax.   LOWER NECK: Unremarkable.   HEART: The heart is normal in size. Minimal coronary artery calcifications. No significant pericardial effusion.   VASCULAR STRUCTURES: Aorta and main pulmonary artery are normal in caliber. Scattered calcified atherosclerotic plaques throughout the aorta and its branches.   MEDIASTINUM AND HILA: No  pathologically enlarged lymph nodes.   CHEST WALL AND AXILLA: Bilateral breast prosthesis. Mild inflammatory stranding in the left axilla.   UPPER ABDOMEN: 2.5 cm lesion along the dome of the right hepatic lobe is unchanged, better characterized on PET.   OSSEOUS STRUCTURES: No suspicious osseous lesion or acute osseous abnormality.     IMPRESSION:   1.  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 Additional thrombus of the left internal and external jugular veins to the level of the carotid bifurcation. There is collateral flow in the left neck through the anterior jugular vein.   2.  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     Cultures: None    Pathology: None    [name]: EMORIE MCFATE is 81 y.o. 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 She has a PMH of chemotherapy induced hypertension, hypothyroidism, and thyroid nodule.    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  - S/p 6 cycles of chemotherapy with carboplatin and paclitaxel weekly (received 6th cycle 08/27/2018)  - She initiated maintenance rubraca 11/2018, but ultimately elected to stop in March 2020 due to side effects  - Patient was diagnosed with second platinum-sensitive recurrence in September 2023 based on  CT C/[name] and PET scan  - S/p C2 of carboplatin/gemcitabine/bevacizumab 10/16/22 after 1 week delay for neutropenia    Extensive LUE/shoulder/neck DVT  - Patient presented to ED 10/19/2022 with complaint of LUE swelling   - Patient found to have 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, left subclavian vein, and left axillary vein with an additional thrombus of the left internal jugular and external jugular veins to the level of the carotid bifurcation  - Patient does appear to have collateral flow in the left neck through the anterior jugular vein  - Patient had port placed on left 09/15/2022 by Surg Onc  - Surg Onc consulted to assess for need for port removal; 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 Ultrasound performed on 10/20/18 with 1.2 cm irregular hypoechoic nodule, appearing suspicious.  - Patient was recommended to  undergo FNA for further evaluation, which was ordered but patient has not followed up  - OSH CT from 03/02/2019 showed decrease in thyroid nodule to 4mm  - Patient was referred to ENT, but has not seen them  - Patient followed up with her PCP and was started on Synthroid  - Continue synthroid daily  - Last TSH 2.62, free T4 0.82, total T3 63.3 11/15/2020    Neuro/Psych  NAI    MSK  NAI    Social   - Patient states her husband is dependent upon her for care due to memory issues; husband is independent with his ADLs, but doesn't drive  - Social work consulted for assistance/needs evaluation    Prophylaxis  DVT  - SCDs  - Heparin gtt per above    Dispo: Admit to floor    Durward Fortes, MD  Obstetrics and Gynecology PGY-4    ATTENDING ADDENDUM  I discussed the patient with the resident team Patient was seen later in the day by Dr Maryruth Bun      Recent Labs     10/19/22  1020 10/20/22  0633   WBC 9.6 5.4   HGB 10.9* 10.3*   HCT 31.0* 29.5*   PLT 238 208   CREATININE 0.66 0.62   NA 131* 135   K 4.4 4.3   CO2 28 31       BP 123/71 (BP Location: Right upper arm, Patient Position: Lying, BP Cuff Size: Regular)   Pulse 61   Temp 97.7 F (36.5 C) (Oral)   Resp 18   Ht 5' 4 (1.626 m)   Wt 122 lb (55.3 kg)   SpO2 97%   BMI 20.94 kg/m     Warner Mccreedy, MD    Division of Gynecologic Oncology  [phone]

## 2022-10-19 NOTE — Unmapped (Signed)
Pt a&Ox4, recent port placed in left upper chest, noticed left arm swelling for a couple of days. No other complaints, VSS, independent, call light within reach, placed on bedside monitor.

## 2022-10-19 NOTE — Unmapped (Signed)
Per Care Coordinator Delphia GratesErica Shipley, Dr. Mechele Claudeale Lobo called and reported the patient needs caregiving resources for her husband who has dementia.  [facility] is en route from the emergency room to the 8th floor, so I called the patient's daughter Bard Herbert(Daphne).  She reports she owns a duplex.  The patient and her husband live on 1 side.  Daphne lives on the other side.  The patient's husband is coherent at his baseline, but needs directions to complete tasks.  She confirmed that he does not wander from the home and has not posed a safety risk (I.E. turn the stove on).  Daphne's work schedule is not flexible over the next 3 days.  Daphne plans to give him breakfast before she goes to work.  Then she will come home and check on him at lunch time and will return home after work.  After Wednesday her work schedule is more flexible, so she will be able to be home with him more.  She reports the patient is concerned that this will be a strain on Daphne if the patient's admission is prolonged.  There are no other family and/or friends who can assist.    I waited on the unit for 45 minutes, but the patient never arrived.  I left a [employer] on Aging referral list on the patient's bed.  The list includes companies that provide private pay caregivers.  The patient can review the list then a Care Coordinator will follow up with her tomorrow.    Jerilynn Birkenheadina Kolbey Teichert MSW, LSW  Social Work Futures traderCare Manager  [phone]

## 2022-10-19 NOTE — Unmapped (Signed)
Problem: Safety  Goal: Patient will be injury free during hospitalization  Description: Assess and monitor vitals signs, neurological status including level of consciousness and orientation. Assess patient's risk for falls and implement fall prevention plan of care and interventions per hospital policy.      Ensure arm band on, uncluttered walking paths in room, adequate room lighting, call light and overbed table within reach, bed in low position, wheels locked, side rails up per policy, and non-skid footwear provided.   Outcome: Progressing  Goal: Patient with weight > 350lbs will have appropriate equipment  Description: Consider ordering Bariatric Bed, Chair and Bedside Commode for patient weight > 350 lbs.  Outcome: Progressing     Problem: Patient will remain free of falls  Goal: Universal Fall Precautions  Outcome: Progressing     Problem: Daily Care  Goal: Daily care needs are met  Description: Assess and monitor ability to perform self care and identify potential discharge needs.  Outcome: Progressing     Problem: Psychosocial Needs  Goal: Demonstrates ability to cope with hospitalization/illness  Description: Assess and monitor patients ability to cope with his/her illness.  Outcome: Progressing  Goal: Collaborate with patient/family to identify patient's goals  Outcome: Progressing     Problem: Discharge Barriers  Goal: Patient's discharge needs are met  Description: Collaborate with interdisciplinary team and initiate plans and interventions as needed.   Outcome: Progressing

## 2022-10-19 NOTE — Unmapped (Signed)
Vascular Surgery Consult Note    History:  HPI: Kelsey Sullivan is a 81 y.o. year old female with h/o recurrent ovarian cancer who p/w left arm swelling. Patient underwent L subclavian Port-A-Cath placement on 09/15/22 and recently started chemotherapy. She underwent her 2nd round of chemo on Thursday and then noticed the LUE the following day. The swelling has progressed up her neck and across her chest. Patient denies LUE pain, CP, SOB, lower extremity edema, fever/chills, HA, or vision changes. CT revealed complete thrombosis of the left brachiocephalic, subclavian, axillary, IJ, and EJ veins. Vascular surgery was consulted for evaluation of the findings above.      Past Medical History:   Diagnosis Date    Bowel obstruction (CMS-HCC) 2018    Ovarian cancer (CMS-HCC) 2014    Thyroid disease      Past Surgical History:   Procedure Laterality Date    ABDOMINOPLASTY  1999    BLEPHAROPLASTY  2000    DILATION AND CURETTAGE OF UTERUS  1979    ELBOW SURGERY  1990    EYE SURGERY  1994    INSERTION CATHETER VASCULAR ACCESS N/A 09/15/2022    Procedure: INSERTION POWER PORT A CATH;  Surgeon: Garrel Ridgel, MD;  Location: [facility] OR;  Service: General;  Laterality: N/A;  Left Subclavian    KNEE ARTHROPLASTY Right     ROTATOR CUFF REPAIR      TRANSUMBILICAL AUGMENTATION MAMMAPLASTY      VAGINAL HYSTERECTOMY  1979     No current facility-administered medications on file prior to encounter.     Current Outpatient Medications on File Prior to Encounter   Medication Sig Dispense Refill    ascorbic acid, vitamin C, (VITAMIN C) 1000 MG tablet Take 1 tablet (1,000 mg total) by mouth daily.      cholecalciferol, vitamin D3, (VITAMIN D3) 1000 units tablet Take 1 tablet (1,000 Units total) by mouth daily. 30 tablet 0    dexAMETHasone (DECADRON) 4 MG tablet Take 8 mg by mouth daily with food on day 2, 3, and 4 of chemotherapy cycle 18 tablet 4    GLUTATHIONE-L ORAL Take 1 teaspoonful by mouth daily.      IODINE ORAL Take 50 mg/day by  mouth.      Lactobac no.41/Bifidobact no.7 (PROBIOTIC-10 ORAL) Take 1 capsule by mouth daily. [employer] brand      levothyroxine (SYNTHROID) 75 MCG tablet Take 1 tablet (75 mcg total) by mouth every morning before breakfast.      lisinopriL (PRINIVIL) 10 MG tablet Take 1 tablet (10 mg total) by mouth daily. 30 tablet 3    omeprazole (PRILOSEC OTC) 20 MG tablet Take 1 tablet (20 mg total) by mouth every morning before breakfast. 20mg  twice a day      ubidecarenone (COENZYME Q10 ORAL) Take 1 tablet by mouth daily.      UNABLE TO FIND Take 30 drops by mouth daily. Multimineral      UNABLE TO FIND Take 20 drops by mouth daily. Med Name: Lurena Joiner Drops      UNABLE TO FIND Take 2 capsules by mouth daily. Med Name: Body Bio      vit b complex w-b 12 tablet Take 1 tablet by mouth daily.      ZINC CITRATE ORAL Take 1 tablet by mouth daily.       Current Medications:  Scheduled Medications:     IV Medications:     PRN Medications:    Allergies   Allergen  Reactions    Nsaids (Non-Steroidal Anti-Inflammatory Drug)      Stomach ache    Sulfamethoxazole-Trimethoprim Other (See Comments)     Hallucinations, anxiety  Other reaction(s): Unknown  loopy  hallucinations    Celecoxib Nausea And Vomiting    Metoclopramide Other (See Comments)     Does not remember     Social History     Socioeconomic History    Marital status: Married     Spouse name: Not on file    Number of children: Not on file    Years of education: Not on file    Highest education level: Not on file   Occupational History    Not on file   Tobacco Use    Smoking status: Some Days     Packs/day: 0     Types: Cigarettes    Smokeless tobacco: Never    Tobacco comments:     Smokes 1 pack every three days    Vaping Use    Vaping Use: Never used   Substance and Sexual Activity    Alcohol use: Yes     Alcohol/week: 14.0 standard drinks of alcohol     Types: 14 Shots of liquor per week    Drug use: No    Sexual activity: Not Currently   Other Topics Concern    Caffeine  Use Yes    Occupational Exposure No    Exercise Yes    Seat Belt Yes   Social History Narrative    Mammogram-6 years ago and it was normal. Pt stated she isn't getting them anymore.      Colonoscopy-10 years ago        Pt reports she can lie flat in bed without SOB.    Pt states she can walk a flight of stairs and city block without chest pain and SOB>       Social Determinants of Health     Financial Resource Strain: Not on file   Food Insecurity: No Food Insecurity (10/16/2022)    Yearly Questionnaire     Do you need any assistance with obtaining housing, meals, medication, transportation or medical equipment?: No     Assistance needed for:: Not on file   Transportation Needs: No Transportation Needs (10/16/2022)    Yearly Questionnaire     Do you need any assistance with obtaining housing, meals, medication, transportation or medical equipment?: No     Assistance needed for:: Not on file   Physical Activity: Not on file   Stress: Not on file   Social Connections: Not on file   Intimate Partner Violence: Not on file   Housing Stability: Low Risk  (10/16/2022)    Yearly Questionnaire     Do you need any assistance with obtaining housing, meals, medication, transportation or medical equipment?: No     Assistance needed for:: Not on file     No family history on file.    Review of Systems   Constitutional:  Negative for chills and fever.   HENT:  Negative for congestion, hearing loss and sore throat.    Eyes:  Negative for blurred vision, double vision and photophobia.   Respiratory:  Negative for cough, shortness of breath and wheezing.    Cardiovascular:  Negative for chest pain, palpitations and leg swelling.   Gastrointestinal:  Negative for abdominal pain, nausea and vomiting.   Genitourinary:  Negative for dysuria, frequency and urgency.   Musculoskeletal:  Negative for back pain, myalgias and neck pain.  LUE swelling   Skin:  Negative for itching and rash.        LUE redness   Neurological:  Negative for  sensory change, weakness and headaches.   Endo/Heme/Allergies:  Negative for environmental allergies and polydipsia. Does not bruise/bleed easily.     Objective:   Vitals:  Vitals:    10/19/22 0912 10/19/22 1335   BP: (!) 165/94 145/86   BP Location: Right upper arm Right upper arm   Patient Position: Sitting Lying   BP Cuff Size:  Regular   Pulse: 82 72   Resp: 20 18   Temp: 98.1 F (36.7 C)    TempSrc: Oral    SpO2: 98% 97%     Physical Exam:  Physical Exam  Constitutional:       General: She is not in acute distress.     Appearance: Normal appearance.   HENT:      Head: Normocephalic and atraumatic.      Right Ear: External ear normal.      Left Ear: External ear normal.      Nose: Nose normal.      Mouth/Throat:      Mouth: Mucous membranes are moist.   Eyes:      Extraocular Movements: Extraocular movements intact.   Neck:      Comments: Minimal swelling at base of neck on the left side  Cardiovascular:      Rate and Rhythm: Normal rate and regular rhythm.      Pulses: Normal pulses.           Radial pulses are 2+ on the right side and 2+ on the left side.        Dorsalis pedis pulses are 2+ on the right side and 2+ on the left side.      Comments: L subclavian port with well-healed incision, no surrounding erythema.   Pulmonary:      Effort: Pulmonary effort is normal. No respiratory distress.   Abdominal:      General: There is no distension.      Palpations: Abdomen is soft.      Tenderness: There is no abdominal tenderness.   Musculoskeletal:         General: Swelling present.      Cervical back: Normal range of motion.      Right lower leg: No edema.      Left lower leg: No edema.      Comments: Entire LUE swollen and more erythematous compared to right, there is no TTP.    Skin:     General: Skin is warm and dry.      Findings: Erythema present.   Neurological:      General: No focal deficit present.      Mental Status: She is alert. Mental status is at baseline.      Sensory: No sensory deficit.       Motor: No weakness.   Psychiatric:         Mood and Affect: Mood normal.         Behavior: Behavior normal.        Labs:  Recent Labs     10/19/22  1020   WBC 9.6   HGB 10.9*   HCT 31.0*   PLT 238     Recent Labs     10/19/22  1020   NA 131*   K 4.4   CL 96*   CO2 28   BUN 25  CREATININE 0.66   GLUCOSE 181*   CALCIUM 8.9     Recent Labs     10/19/22  1020   INR 0.9   PROTIME 12.9     No results for input(s): HPTT in the last 72 hours.    Results for orders placed during the hospital encounter of 10/19/22    CT CHEST WITH IV CONTRAST    Impression  1.  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 Additional thrombus of the left internal and external jugular veins to the level of the carotid bifurcation. There is collateral flow in the left neck through the anterior jugular vein.  2.  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  3.    Approved by Ephriam Jenkins, MD on 10/19/2022 12:29 PM EST    I have personally reviewed the images and I agree with this report.    Report Verified by: Tobin Chad, MD at 10/19/2022 12:43 PM EST    Signed by: Jasper Riling, MD on 10/19/2022 12:43 PM    Assessment/Plan:   Tarahji Ramthun is a 81 y.o. female with h/o recurrent ovarian cancer who p/w left arm swelling. CT revealed complete thrombosis of the left brachiocephalic, subclavian, axillary, IJ, and EJ veins.     Recommendation(s):  -No acute vascular surgical intervention indicated  -Ace wrap around the LUE & elevate   -No contraindication to accessing port  -Continue hep gtt, ok to transition to PO/SQ [facility] per primary team. Agent choice per primary team    Pete Glatter, MD  10/19/2022  Vascular Surgery Service Pager [phone])

## 2022-10-19 NOTE — Unmapped (Signed)
Ready and clean bed assigned to [id number]. Pt updated on plan of care including transfer and is agreeable. Receiving RN may call [phone] to consult ED RN with questions regarding patients care. Pt is leaving the department in stable condition with all personal items in possession.   Ordered medications that have been received from Pharmacy will be tubed to receiving unit.  The patient does have a patient monitor at bedside in the ED.  Most recent vitals: BP 132/79   Pulse 63   Temp 98.1 F (36.7 C) (Oral)   Resp 16   SpO2 97%     CHEYENNE HEMBERGER ,RN

## 2022-10-19 NOTE — Unmapped (Signed)
ED Attending Attestation Note    Date of service:  10/19/2022    This patient was seen by the resident physician.  I have seen and examined the patient, agree with the workup, evaluation, management and diagnosis. The care plan has been discussed and I concur.  I have reviewed the ECG and concur with the resident's interpretation.    My assessment reveals a 81 y.o. female awake alert female with history of ovarian cancer presents with swelling that started a couple days ago on her left arm.  She has a Port-A-Cath in her left subclavian.  She notes some swelling in the neck veins as well.  No pain with there is no erythema there is swelling noted left arm no evidence of compartment syndrome there is some engorgement of the left external jugular vein in the left neck no gross facial swelling.

## 2022-10-20 LAB — DIFFERENTIAL
Basophils Absolute: 6 /uL (ref 0–200)
Basophils Relative: 0.1 % (ref 0.0–1.0)
Eosinophils Absolute: 6 /uL (ref 15–500)
Eosinophils Relative: 0.1 % (ref 0.0–8.0)
Lymphocytes Absolute: 1613 /uL (ref 850–3900)
Lymphocytes Relative: 28.8 % (ref 15.0–45.0)
Monocytes Absolute: 62 /uL (ref 200–950)
Monocytes Relative: 1.1 % (ref 0.0–12.0)
Neutrophils Absolute: 3914 /uL (ref 1500–7800)
Neutrophils Relative: 69.9 % (ref 40.0–80.0)
nRBC: 0 /100 WBC (ref 0–0)

## 2022-10-20 LAB — CBC
Hematocrit: 29.5 % — ABNORMAL LOW (ref 35.0–45.0)
Hemoglobin: 10.3 g/dL — ABNORMAL LOW (ref 11.7–15.5)
MCH: 35.2 pg — ABNORMAL HIGH (ref 27.0–33.0)
MCHC: 34.9 g/dL (ref 32.0–36.0)
MCV: 100.9 fL — ABNORMAL HIGH (ref 80.0–100.0)
MPV: 8.3 fL (ref 7.5–11.5)
Platelets: 208 10E3/uL (ref 140–400)
RBC: 2.93 10E6/uL — ABNORMAL LOW (ref 3.80–5.10)
RDW: 13.7 % (ref 11.0–15.0)
WBC: 5.4 10E3/uL (ref 3.8–10.8)

## 2022-10-20 LAB — APTT-HEPARIN
hPTT: 71.9 seconds (ref 90.0–130.0)
hPTT: >200 s — CR (ref 90.0–130.0)

## 2022-10-20 LAB — PHOSPHORUS: Phosphorus: 3.3 mg/dL (ref 2.1–4.5)

## 2022-10-20 LAB — MAGNESIUM: Magnesium: 1.8 mg/dL (ref 1.5–2.5)

## 2022-10-20 LAB — BASIC METABOLIC PANEL
Anion Gap: 7 mmol/L (ref 3–16)
BUN: 22 mg/dL (ref 7–25)
CO2: 31 mmol/L (ref 21–33)
Calcium: 9 mg/dL (ref 8.6–10.3)
Chloride: 97 mmol/L — ABNORMAL LOW (ref 98–110)
Creatinine: 0.62 mg/dL (ref 0.60–1.30)
EGFR: 89
Glucose: 104 mg/dL — ABNORMAL HIGH (ref 70–100)
Osmolality, Calculated: 284 mosm/kg (ref 278–305)
Potassium: 4.3 mmol/L (ref 3.5–5.3)
Sodium: 135 mmol/L (ref 133–146)

## 2022-10-20 MED ORDER — heparin lock flush 100 unit/mL syringe
100 | INTRAVENOUS | Status: AC
Start: 2022-10-20 — End: 2022-10-20
  Administered 2022-10-20: 19:00:00 500

## 2022-10-20 MED ORDER — senna (SENOKOT) tablet 1 tablet
8.6 | Freq: Every evening | ORAL
Start: 2022-10-20 — End: 2022-10-20

## 2022-10-20 MED ORDER — sodium chloride (OCEAN) 0.65 % nasal spray 1 spray
0.65 | NASAL | PRN
Start: 2022-10-20 — End: 2022-10-20

## 2022-10-20 MED ORDER — apixaban (ELIQUIS DVT-PE TREAT 30D START) 5 mg (74 tabs) DsPk
5 | ORAL_TABLET | ORAL | 0 refills
Start: 2022-10-20 — End: 2022-10-20

## 2022-10-20 MED ORDER — apixaban (ELIQUIS) 5 mg Tab
5 | ORAL_TABLET | Freq: Two times a day (BID) | ORAL | 1 refills
Start: 2022-10-20 — End: 2022-11-06

## 2022-10-20 MED ORDER — magnesium sulfate in D5W 100 mL 1 gram/100 mL IVPB 1 g
1 | Freq: Once | INTRAVENOUS | Status: AC
Start: 2022-10-20 — End: 2022-10-20
  Administered 2022-10-20: 15:00:00 1 g via INTRAVENOUS

## 2022-10-20 MED ORDER — apixaban (ELIQUIS) tablet 10 mg
5 | Freq: Two times a day (BID) | ORAL
Start: 2022-10-20 — End: 2022-10-20
  Administered 2022-10-20: 16:00:00 10 mg via ORAL

## 2022-10-20 MED ORDER — apixaban (ELIQUIS) 5 mg Tab
5 | ORAL_TABLET | Freq: Two times a day (BID) | ORAL | 1 refills
Start: 2022-10-20 — End: 2022-10-20

## 2022-10-20 MED ORDER — apixaban (ELIQUIS) 5 mg (74 tabs) DsPk
5 | ORAL_TABLET | ORAL | 0 refills | 30.00000 days | Status: AC | PRN
Start: 2022-10-20 — End: 2022-11-24
  Filled 2022-10-20: qty 74, 30d supply, fill #0

## 2022-10-20 MED ORDER — apixaban (ELIQUIS) tablet 5 mg
5 | Freq: Two times a day (BID) | ORAL
Start: 2022-10-20 — End: 2022-10-20

## 2022-10-20 MED ORDER — apixaban (ELIQUIS) 5 mg Tab
5 | ORAL_TABLET | Freq: Two times a day (BID) | ORAL | 0 refills
Start: 2022-10-20 — End: 2022-10-20

## 2022-10-20 MED ORDER — polyethylene glycol (MIRALAX) packet 17 g
17 | Freq: Every day | ORAL
Start: 2022-10-20 — End: 2022-10-20
  Administered 2022-10-20: 15:00:00 17 g via ORAL

## 2022-10-20 MED FILL — SALINE MIST 0.65 % NASAL SPRAY AEROSOL: 0.65 0.65 % | NASAL | Qty: 45

## 2022-10-20 MED FILL — LEVOTHYROXINE 25 MCG TABLET: 25 25 MCG | ORAL | Qty: 3

## 2022-10-20 MED FILL — POLYETHYLENE GLYCOL 3350 17 GRAM ORAL POWDER PACKET: 17 17 gram | ORAL | Qty: 1

## 2022-10-20 MED FILL — HEPARIN, PORCINE (PF) 100 UNIT/ML INTRAVENOUS SYRINGE: 100 100 unit/mL | INTRAVENOUS | Qty: 5

## 2022-10-20 MED FILL — ELIQUIS 5 MG TABLET: 5 5 mg | ORAL | Qty: 2

## 2022-10-20 MED FILL — PANTOPRAZOLE 40 MG TABLET,DELAYED RELEASE: 40 40 MG | ORAL | Qty: 1

## 2022-10-20 MED FILL — MAGNESIUM SULFATE 1 GRAM/100 ML IN DEXTROSE 5 % INTRAVENOUS PIGGYBACK: 1 1 gram/100 mL | INTRAVENOUS | Qty: 100

## 2022-10-20 MED FILL — HEPARIN (PORCINE) 5,000 UNIT/ML INJECTION SOLUTION: 5000 5,000 unit/mL | INTRAMUSCULAR | Qty: 1

## 2022-10-20 MED FILL — LISINOPRIL 10 MG TABLET: 10 10 MG | ORAL | Qty: 1

## 2022-10-20 NOTE — Unmapped (Cosign Needed)
Gynecology Oncology Progress Note    Indication for admission: LUE DVT  Hospital day: 2    S: Kelsey Sullivan is an 81 y.o. 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     Patient reports feeling well overall. She denies any LUE pain, numbness or tingling. Seh has a hx of SBO and per RN, is requesting a bowel regimen. Denies chest pain, sob, abdominal pain, nausea, vomiting, diarrhea, fevers or chills.     O:  Vitals:    10/19/22 1743 10/19/22 2014 10/19/22 2300 10/20/22 0605   BP:  144/74 168/70 (!) 165/91   BP Location:  Right upper arm Right upper arm Right upper arm   Patient Position:  Sitting Lying Lying   BP Cuff Size:  Regular Regular Regular   Pulse:  73 60 57   Resp:  18 18 18    Temp:  97.9 F (36.6 C) 98.2 F (36.8 C)    TempSrc:  Oral Oral Oral   SpO2:  97% 100% 100%   Weight: 122 lb (55.3 kg)      Height: 5' 4 (1.626 m)            Intake/Output Summary (Last 24 hours) at 10/20/2022 0612  Last data filed at 10/20/2022 0600  Gross per 24 hour   Intake 125.01 ml   Output --   Net 125.01 ml           Gen: NAD  Chest: non labored   Heart: RRR  Abd: soft-nontender  Ext: LUE wrapped in ACE bandage, well perfused, nontender, ROM intact, sensation intact    Labs:  Recent Results (from the past 24 hour(s))   CBC    Collection Time: 10/19/22 10:20 AM   Result Value Ref Range    WBC 9.6 3.8 - 10.8 10E3/uL    RBC 3.07 (L) 3.80 - 5.10 10E6/uL    Hemoglobin 10.9 (L) 11.7 - 15.5 g/dL    Hematocrit 14.7 (L) 35.0 - 45.0 %    MCV 100.9 (H) 80.0 - 100.0 fL    MCH 35.4 (H) 27.0 - 33.0 pg    MCHC 35.1 32.0 - 36.0 g/dL    RDW 82.9 56.2 - 13.0 %    Platelets 238 140 -  400 10E3/uL    MPV 8.0 7.5 - 11.5 fL   Differential    Collection Time: 10/19/22 10:20 AM   Result Value Ref Range    Neutrophils Relative 92.1 (H) 40.0 - 80.0 %    Lymphocytes Relative 5.3 (L) 15.0 - 45.0 %    Monocytes Relative 2.5 0.0 - 12.0 %    Eosinophils Relative 0.0 0.0 - 8.0 %    Basophils Relative 0.1 0.0 - 1.0 %    nRBC 0 0 - 0 /100 WBC    Neutrophils Absolute 8,842 (H) 1,500 - 7,800 /uL    Lymphocytes Absolute 509 (L) 850 - 3,900 /uL    Monocytes Absolute 240 200 - 950 /uL    Eosinophils Absolute 0 (L) 15 - 500 /uL    Basophils Absolute 10 0 - 200 /uL   Basic metabolic panel    Collection Time: 10/19/22 10:20 AM  Result Value Ref Range    Sodium 131 (L) 133 - 146 mmol/L    Potassium 4.4 3.5 - 5.3 mmol/L    Chloride 96 (L) 98 - 110 mmol/L    CO2 28 21 - 33 mmol/L    Anion Gap 7 3 - 16 mmol/L    BUN 25 7 - 25 mg/dL    Creatinine 1.61 0.96 - 1.30 mg/dL    Glucose 045 (H) 70 - 100 mg/dL    Calcium 8.9 8.6 - 40.9 mg/dL    Osmolality, Calculated 281 278 - 305 mOsm/kg    EGFR 88    Protime-INR    Collection Time: 10/19/22 10:20 AM   Result Value Ref Range    Protime 12.9 12.1 - 15.1 seconds    INR 0.9 0.9 - 1.1   PTT, No Anticoagulant    Collection Time: 10/19/22 10:20 AM   Result Value Ref Range    aPTT 21.8 (L) 25.5 - 35.0 seconds   ABO/Rh    Collection Time: 10/19/22  5:01 PM   Result Value Ref Range    ABO Grouping O     Rh Type Positive    Antibody Screen    Collection Time: 10/19/22  5:01 PM   Result Value Ref Range    Antibody Screen Negative    aPTT-Heparin    Collection Time: 10/19/22 11:08 PM   Result Value Ref Range    hPTT 71.9 (L) 90.0 - 130.0 seconds         Imaging:  CT Chest 10/19/2022  FINDINGS:   SVC: Patent   Left subclavian vein: Thrombosed.   Left internal jugular vein: Thrombosed from the level of the carotid bifurcation level. The left external jugular veins is also thrombosed to a similar level. There is collateral flow through the anterior jugular vein.   Left brachiocephalic vein:  Thrombosed. The left axillary and included portions of the left cephalic, basilic, and brachial arteries are also thrombosed.   Right subclavian vein: Patent   Right internal jugular vein: Patent   Right brachiocephalic vein: Patent   Azygous vein: Patent   Venous collaterals: No significant collaterals have developed.   MEDICAL DEVICES: Left subclavian chest port terminates in the lower SVC.   AIRWAYS & LUNGS: The central airways are patent. Bibasilar bandlike atelectasis/scarring. No focal lung consolidation.   PLEURA: No pleural effusions or pneumothorax.   LOWER NECK: Unremarkable.   HEART: The heart is normal in size. Minimal coronary artery calcifications. No significant pericardial effusion.   VASCULAR STRUCTURES: Aorta and main pulmonary artery are normal in caliber. Scattered calcified atherosclerotic plaques throughout the aorta and its branches.   MEDIASTINUM AND HILA: No pathologically enlarged lymph nodes.   CHEST WALL AND AXILLA: Bilateral breast prosthesis. Mild inflammatory stranding in the left axilla.   UPPER ABDOMEN: 2.5 cm lesion along the dome of the right hepatic lobe is unchanged, better characterized on PET.   OSSEOUS STRUCTURES: No suspicious osseous lesion or acute osseous abnormality.      IMPRESSION:   1.  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 Additional thrombus of the left internal and external jugular veins to the level of the carotid bifurcation. There is collateral flow in the left neck through the anterior jugular vein.   2.  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     Cultures:  None    Pathology:  None  [name]: Kelsey Sullivan is a 81 y.o. 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     Cardiovascular  Chemotherapy-induced hypertension  - Managed with lisinopril 10mg  daily (added 10/07/2022)  - Lisinopril initially held in case of need for surgical mgmt on admission, restarted 11/13 in AM  - BP hypertensive to 160s overnight, patient asymptomatic      Respiratory  NAI     FEN/GI/Renal  Diet: Regular   Fluids: None  Bowel regimen ordered   Electrolytes: Replace electrolytes PRN to maintain K>4, Phos >3, Mag >2     ID  Chemotherapy induced neutropenia  - C2 of current chemo regimen delayed 2/2 neutropenia  - Neulasta added to D9 C2 (will need 10/24/2022)  - Not currently neutropenic; monitor with daily labs     Heme/Onc  Recurrent 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 Domingo Cocking at [facility]; pathology was consistent with stage IIIC high grade serous fallopian tube and STIC with mets to the left fallopian tube, ovary, omentum, and cul de sac peritoneum  - Patient declined adjuvant therapy  - Diagnosed with platinum-sensitive recurrence in April 2019  - S/p 6 cycles of chemotherapy with carboplatin and paclitaxel weekly (received 6th cycle 08/27/2018)  - She initiated maintenance rubraca 11/2018, but ultimately elected to stop in March 2020 due to side effects  - Patient was diagnosed with second platinum-sensitive recurrence in September 2023 based on CT C/[name] and PET scan  - S/p C2 of carboplatin/gemcitabine/bevacizumab 10/16/22 after 1 week delay for neutropenia     Extensive LUE/shoulder/neck DVT  - Patient presented to ED 10/19/2022 with  complaint of LUE swelling   - Patient found to have 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, left subclavian vein, and left axillary vein with an additional thrombus of the left internal jugular and external jugular veins to the level of the carotid bifurcation  - Patient does appear to have collateral flow in the left neck through the anterior jugular vein  - Patient had port placed on left 09/15/2022 by Surg Onc  - Surg Onc consulted to assess for need for port removal; currently unclear if port functioning as RN uncomfortable accessing as attempts to access have not been made yet. RN notified to access port as there are no contraindications  - Vascular surgery consulted; appreciate recs  - On hep ggt     Endocrine  Hypothyroidism  Thyroid nodule  - Incidental finding on PET scan of hypermetabolic focus within left thyroid nodule. Ultrasound performed on 10/20/18 with 1.2 cm irregular hypoechoic nodule, appearing suspicious.  - Patient was recommended to undergo FNA for further evaluation, which was ordered but patient has not followed up  - OSH CT from 03/02/2019 showed decrease in thyroid nodule to 4mm  - Patient was referred to ENT, but has not seen them  - Patient followed up with her PCP and was started  on Synthroid  - Continue synthroid daily  - Last TSH 2.62, free T4 0.82, total T3 63.3 11/15/2020     Neuro/Psych  NAI     MSK  NAI     Social   - Patient states her husband is dependent upon her for care due to memory issues; husband is independent with his ADLs, but doesn't drive  - Social work consulted for assistance/needs evaluation     Prophylaxis  DVT  - SCDs  - Heparin gtt per above    Dispo:  inpatient    Cristal Generous, MD PGY-1  Obstetrics and Gynecology

## 2022-10-20 NOTE — Unmapped (Signed)
Port accessed per MD order. Blood return noted, flushed, saline locked at this time.

## 2022-10-20 NOTE — Unmapped (Addendum)
Discharge Instructions and Information  Kelsey Sullivan      Left upper extremity blood clot  You were found to have an extensive blood clot in your left arm on admission. Please take Eliquis 10mg  (2 tablets) twice a day for 7 days (11/13-11/19) followed by 5mg  (1 tablet) twice a day for 6 months. If you have worsening left arm pain, numbness, or tingling, call the office immediately or go to the emergency department.       Follow-Up Appointments     Future Appointments   Date Time Provider Department Center   10/23/2022  7:30 AM PORT CHAIR 2 BC3 [facility] PORT 3 [facility]   10/23/2022  8:00 AM CHAIR 03 INF 3 BC [facility] INF3 [facility]   10/24/2022  2:00 PM CHAIR 01 INF 3 BC [facility] INF3 [facility]   11/06/2022  8:45 AM PORT CHAIR 2 BC3 [facility] PORT 3 [facility]   11/06/2022  9:15 AM Mikle Bosworth, MD [facility]   11/06/2022 10:15 AM CHAIR 08 INF 3 BC [facility] INF3 [facility]     No follow-up provider specified.            Additional Contacts: [facility]    Important numbers:  Patient Care: [phone]  Patient Care Fax: [phone]  Surgical Scheduling: [phone]  Social Work: [phone]  [facility]: [phone]  [facility]: [phone]  Financial Counselor: [phone]   Medical Records: [phone]      If you need medical assistance after hours, please call the hospital operator [phone] and ask to speak to the gynecologic oncology resident on call.      Please note that the MyChart messaging system is for non-urgent issues only. The reply time can be up to 24 hours. MyChart messages are not checked on the weekends or on holidays, so please do not use this form of communication if you need to get in touch with your doctor and team during these times or for urgent issues. For emergencies please call 911.

## 2022-10-20 NOTE — Unmapped (Signed)
Problem: Safety  Goal: Patient will be injury free during hospitalization  Description: Assess and monitor vitals signs, neurological status including level of consciousness and orientation. Assess patient's risk for falls and implement fall prevention plan of care and interventions per hospital policy.      Ensure arm band on, uncluttered walking paths in room, adequate room lighting, call light and overbed table within reach, bed in low position, wheels locked, side rails up per policy, and non-skid footwear provided.   Outcome: Progressing  Goal: Patient with weight > 350lbs will have appropriate equipment  Description: Consider ordering Bariatric Bed, Chair and Bedside Commode for patient weight > 350 lbs.  Outcome: Progressing     Problem: Patient will remain free of falls  Goal: Universal Fall Precautions  Outcome: Progressing     Problem: Daily Care  Goal: Daily care needs are met  Description: Assess and monitor ability to perform self care and identify potential discharge needs.  Outcome: Progressing     Problem: Psychosocial Needs  Goal: Demonstrates ability to cope with hospitalization/illness  Description: Assess and monitor patients ability to cope with his/her illness.  Outcome: Progressing  Goal: Collaborate with patient/family to identify patient's goals  Outcome: Progressing     Problem: Discharge Barriers  Goal: Patient's discharge needs are met  Description: Collaborate with interdisciplinary team and initiate plans and interventions as needed.   Outcome: Progressing

## 2022-10-20 NOTE — Unmapped (Signed)
Discharge instructions reviewed with pt.  Left chest port de-accessed per charge RN.  All questions answered.

## 2022-10-20 NOTE — Unmapped (Signed)
[facility] Progress Note  Name: Kelsey Sullivan  MRN: [id number]  CSN: [id number]  Date: 10/20/2022  6:47 AM    Subjective:     Continues on Heparin drip, goal 90-130  Patient states swelling in left arm has improved with ace wraps, compression  No new complaints this morning     Objective:     Vitals:    10/19/22 1743 10/19/22 2014 10/19/22 2300 10/20/22 0605   BP:  144/74 168/70 (!) 165/91   BP Location:  Right upper arm Right upper arm Right upper arm   Patient Position:  Sitting Lying Lying   BP Cuff Size:  Regular Regular Regular   Pulse:  73 60 57   Resp:  18 18 18    Temp:  97.9 F (36.6 C) 98.2 F (36.8 C)    TempSrc:  Oral Oral Oral   SpO2:  97% 100% 100%   Weight: 122 lb (55.3 kg)      Height: 5' 4 (1.626 m)          Intake/Output  No intake/output data recorded.    Date 10/19/22 0700 - 10/20/22 0659 10/20/22 0700 - 10/21/22 0659   Shift 0700-1459 1500-2259 2300-0659 24 Hour Total 0700-1459 1500-2259 2300-0659 24 Hour Total   INTAKE   I.V.  38.4(0.7) 86.7(1.6) 125(2.3)         Volume (mL) Heparin  38.4 86.7 125       Shift Total(mL/kg)  38.4(0.7) 86.7(1.6) 125(2.3)       OUTPUT   Urine  1(0) 2 3         Urine  1 2 3        Shift Total(mL/kg)  1(0) 2(0) 3(0.1)       Weight (kg)  55.3 55.3 55.3 55.3 55.3 55.3 55.3       Physical Exam:  Constitutional: No acute distress, conversant  HENT: nose/ears atraumatic, oropharynx with moist mucous membranes, no epistaxis  Respiratory: Respirations unlabored, no audible wheezing  Cardiovascular: regular rate, hemodynamically stable  MSK: Swelling noted unilaterally to LUE/neck, improving. Ace wrapped LUE.   Neuro: no focal deficits      Medications  Scheduled Meds:   levothyroxine  75 mcg Oral QAM AC    [Held by provider] lisinopriL  10 mg Oral Daily 0900    pantoprazole  40 mg Oral DAILY 0600     Continuous:    heparin 20 Units/kg/hr (10/20/22 0041)     PRN Meds:  heparin (porcine), 80 Units/kg, Q6H PRN   And  heparin (porcine), 40 Units/kg, Q6H PRN        Recent  Labs     10/19/22  1020   WBC 9.6   HGB 10.9*   HCT 31.0*   MCV 100.9*   PLT 238     No results for input(s): AST, ALT, ALKPHOS, BILITOT, ALBUMIN, BILIDIRECT, PROT, LABGLOB in the last 72 hours.  Recent Labs     10/19/22  1020   INR 0.9   PROTIME 12.9      Recent Labs     10/19/22  1020   NA 131*   K 4.4   CL 96*   CO2 28   BUN 25   CREATININE 0.66   CALCIUM 8.9   GLUCOSE 181*     No results for input(s): LACTATE in the last 72 hours.      Imaging  CT Chest With IV contrast    Result Date: 10/19/2022  IMPRESSION: 1.  Complete thrombosis  of the left brachiocephalic vein, left subclavian vein, left axillary vein, and included portions of the left upper extremity veins. Additional thrombus of the left internal and external jugular veins to the level of the carotid bifurcation. There is collateral flow in the left neck through the anterior jugular vein. 2.  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 3.  Approved by Ephriam Jenkins, MD on 10/19/2022 12:29 PM EST I have personally reviewed the images and I agree with this report. Report Verified by: Tobin Chad, MD at 10/19/2022 12:43 PM EST     Assessment and Plan     Kelsey Sullivan is a 81 y.o. female with h/o fallopian tube cancer, L subclavian port placed by the [facility] 09/15/2022, presented with several days of LUE swelling and found to have thromboses of L brachiocephalic, subclavian, IJ, EJ, axillary veins.     Plan:  - Continue therapeutic anticoagulation  - No contraindication to accessing port. Okay to use if functioning   - Would not use RUE for PICC line or other venous access  - Will discuss timing of re-imaging to evaluate for improvement    Agustin Cree, MD  [facility]  6:47 AM  10/20/2022    Patient Active Problem List   Diagnosis    Malignant neoplasm of ovary (CMS-HCC)    H/O small bowel obstruction    Small bowel obstruction (CMS-HCC)         Superficial venous thrombosis of arm, left [I82.612]

## 2022-10-20 NOTE — Unmapped (Signed)
BP (!) 165/91 (BP Location: Right upper arm, Patient Position: Lying, BP Cuff Size: Regular)   Pulse 57   Temp 98.2 F (36.8 C) (Oral)   Resp 18   Ht 5' 4 (1.626 m)   Wt 122 lb (55.3 kg)   SpO2 100%   BMI 20.94 kg/m       Dr Genevive Bi and team rounding. MD notified of VS and pt request for bowel regimen. MD acknowledged information, pt updatd.   Uneventful night, pt up to BR, independent. LUE ace wrapped, elevated.     Bedside report given to RN Chrissy

## 2022-10-20 NOTE — Unmapped (Cosign Needed)
[facility]  Inpatient Discharge Summary     Patient: Kelsey Sullivan  Age: 81 y.o.    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   Chemotherapy-induced hypertension   Chemotherapy induced neutropenia   Extensive LUE DVT   Hypothyroidism  Thyroid nodule    Operations/Procedures Performed (include dates)     Surgeries: None       Lines and tubes:  Patient Lines/Drains/Airways Status       Active Line / PIV Line       Name Placement date Placement time Site Days    Port A Cath Left Subclavian --  --  Subclavian  --    Peripheral IV 10/19/22 Right Antecubital 10/19/22  1017  Antecubital  1    Peripheral IV 10/19/22 Right Antecubital 10/19/22  1015  Antecubital  1                    Other Procedures / Pertinent Imaging:  CT Chest 10/19/2022  FINDINGS:   SVC: Patent   Left subclavian vein: Thrombosed.   Left internal jugular vein: Thrombosed from the level of the carotid bifurcation level. The left external jugular veins is also thrombosed to a similar level. There is collateral flow through the anterior jugular vein.   Left brachiocephalic vein: Thrombosed. The left axillary and included portions of the left cephalic, basilic, and brachial arteries are also thrombosed.   Right subclavian vein: Patent   Right internal jugular vein: Patent   Right brachiocephalic vein: Patent   Azygous vein: Patent   Venous collaterals: No significant collaterals have developed.   MEDICAL DEVICES: Left subclavian chest port terminates in the lower SVC.   AIRWAYS &  LUNGS: The central airways are patent. Bibasilar bandlike atelectasis/scarring. No focal lung consolidation.   PLEURA: No pleural effusions or pneumothorax.   LOWER NECK: Unremarkable.   HEART: The heart is normal in size. Minimal coronary artery calcifications. No significant pericardial effusion.   VASCULAR STRUCTURES: Aorta and main pulmonary artery are normal in caliber. Scattered calcified atherosclerotic plaques throughout the aorta and its branches.   MEDIASTINUM AND HILA: No pathologically enlarged lymph nodes.   CHEST WALL AND AXILLA: Bilateral breast prosthesis. Mild inflammatory stranding in the left axilla.   UPPER ABDOMEN: 2.5 cm lesion along the dome of the right hepatic lobe is unchanged, better characterized on PET.   OSSEOUS STRUCTURES: No suspicious osseous lesion or acute osseous abnormality.      IMPRESSION:   1.  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 Additional thrombus of the left internal and external jugular veins to the level of the carotid bifurcation. There is collateral flow in the left neck through the anterior jugular vein.   2.  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     Lab Review:  Lab Results   Component Value Date    WBC 5.4  10/20/2022    HGB 10.3 (L) 10/20/2022    HCT 29.5 (L) 10/20/2022    MCV 100.9 (H) 10/20/2022    PLT 208 10/20/2022        Lab Results   Component Value Date    CREATININE 0.62 10/20/2022    NA 135 10/20/2022    K 4.3 10/20/2022    CL 97 (L) 10/20/2022    CO2 31 10/20/2022         Consulting Services (include reason)     Vascular Surgery  Surgical Oncology    Allergies     Allergies   Allergen Reactions    Nsaids (Non-Steroidal Anti-Inflammatory Drug)      Stomach ache    Sulfamethoxazole-Trimethoprim Other (See Comments)     Hallucinations, anxiety  Other reaction(s): Unknown  loopy  hallucinations    Celecoxib Nausea And Vomiting     Metoclopramide Other (See Comments)     Does not remember       Discharge Medications     Current Discharge Medication List        START taking these medications    Details   apixaban (ELIQUIS) 5 mg Tab Take 1 tablet (5 mg total) by mouth 2 times a day.  Qty: 180 tablet, Refills: 1      apixaban (ELIQUIS) 5 mg (74 tabs) DsPk Take 2 tabetls (10 mg) by mouth twice a day for 7 days; followed by 1 tablet (5 mg) twice a day.  Qty: 74 tablet, Refills: 0           CONTINUE these medications which have NOT CHANGED    Details   ascorbic acid, vitamin C, (VITAMIN C) 1000 MG tablet Take 1 tablet (1,000 mg total) by mouth daily.      cholecalciferol, vitamin D3, (VITAMIN D3) 1000 units tablet Take 1 tablet (1,000 Units total) by mouth daily.  Qty: 30 tablet, Refills: 0      dexAMETHasone (DECADRON) 4 MG tablet Take 8 mg by mouth daily with food on day 2, 3, and 4 of chemotherapy cycle  Qty: 18 tablet, Refills: 4    Associated Diagnoses: Malignant neoplasm of ovary, unspecified laterality (CMS-HCC)      GLUTATHIONE-L ORAL Take 1 teaspoonful by mouth daily.      IODINE ORAL Take 50 mg/day by mouth.      Lactobac no.41/Bifidobact no.7 (PROBIOTIC-10 ORAL) Take 1 capsule by mouth daily. [employer] brand      levothyroxine (SYNTHROID) 75 MCG tablet Take 1 tablet (75 mcg total) by mouth every morning before breakfast.      lisinopriL (PRINIVIL) 10 MG tablet Take 1 tablet (10 mg total) by mouth daily.  Qty: 30 tablet, Refills: 3      omeprazole (PRILOSEC OTC) 20 MG tablet Take 1 tablet (20 mg total) by mouth every morning before breakfast. 20mg  twice a day      ubidecarenone (COENZYME Q10 ORAL) Take 1 tablet by mouth daily.      !! UNABLE TO FIND Take 30 drops by mouth daily. Multimineral      !! UNABLE TO FIND Take 20 drops by mouth daily. Med Name: Lurena Joiner Drops      !! UNABLE TO FIND Take 2 capsules by mouth daily. Med Name: Body Bio      vit b complex w-b 12 tablet Take 1 tablet by mouth daily.      ZINC CITRATE ORAL Take 1  tablet by mouth daily.       !! -  Potential duplicate medications found. Please discuss with provider.          Discharge Exam     See note from date of discharge    Reason for Admission     Kelsey Sullivan is a 81 y.o. female with history of recurrent stage IIIC high grade serous fallopian tube cancer and STIC who [facility] with complaints of left arm swelling, found to have extensive venous clot associated with her implanted port involving 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, left subclavian vein, left axillary vein, left internal jugular vein, and left external jugular vein. She has a PMH of chemotherapy induced hypertension, hypothyroidism, and thyroid nodule.       Hospital Course     Extensive LUE/shoulder/neck DVT  Patient was admitted as above. She was started on a heparin drip and transitioned to eliquis on HD1. Vascular surgery was consulted with no further recommendations. Surgical Oncology was consulted to assess for need for port removal. Her port was accessed without difficulty and noted to be functioning well, therefore removal was not indicated. She will continue eliquis 10 mg BID for 7 days (11/13-11/19) followed by eliquis 5mg  BID for 6 months. Per surgical oncology, she only needs repeat imaging if the swelling gets worse [facility] or if the port becomes non-functional. There is no particular need for re-imaging in the next week, but may consider in the time frame of several weeks to months if there is no issues.    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 S/p C2 of carboplatin/gemcitabine/bevacizumab 10/16/22 after 1 week delay for neutropenia. C2D8 Gemcitabine is planned for 10/23/22.    Chemotherapy-induced hypertension   Lisinopril was initially held on admission in case of need for surgical management  and restarted on HD1. She was hypertensive to 140s-160s systolic during hospital stay.     Chemotherapy induced neutropenia   C2 of current chemo regimen delayed 2/2 neutropenia. Neulasta added to D9 C2 (will need 10/24/2022). Patient was not neutropenic during admission.     On the date of discharge she had remained afebrile, her vital signs were stable.    Discharge instructions were reviewed and further [facility].  Medications are per the medical reconciliation sheet.     She was discharged home in stable condition.     Condition on Discharge     1. Functional Status: normal   Describe limitations, if any: none    2. Mental Status: Alert/Oriented   Describe limitations, if any: none    3. Dietary Restrictions / Tube Feeding / TPN  Diet/Nutrition Orders    Diet regular     Frequency: Effective Now     Number of Occurrences: Until Specified     Order Questions:      Suicide/Behavior Risk Modification? No     As listed above    4. Discharge specific orders:   DISCHARGE INSTRUCTIONS      You were found to have an extensive blood clot in your left arm on admission. Please take Eliquis 10mg  (2 tablets) twice a day for 7 days (11/13-11/19) followed by 5mg  (1 tablet) twice a day for 6 months. If you have worsening left arm pain, numbness, or tingling, call the office immediately or go to the emergency department.  Disposition     Home with assistance      Follow-Up Appointments     Future Appointments   Date Time Provider Department Center   10/23/2022  7:30 AM PORT CHAIR 2 [facility]3 [facility] PORT 3 [facility]   10/23/2022  8:00 AM CHAIR 03 INF 3 [facility] INF3 [facility]   10/24/2022  2:00 PM CHAIR 01 INF 3 BC [facility] INF3 [facility]   11/06/2022  8:45 AM PORT CHAIR 2 BC3 [facility] PORT 3 [facility]   11/06/2022  9:15 AM Mikle Bosworth, MD [facility]   11/06/2022 10:15 AM CHAIR 08 INF 3 BC [facility] INF3 [facility]     No follow-up provider specified.    Signed:    Cristal Generous, MD PGY-1  Obstetrics and  Gynecology  10/20/2022, 12:54 PM

## 2022-10-23 ENCOUNTER — Institutional Professional Consult (permissible substitution): Admit: 2022-10-23 | Payer: Medicare (Managed Care)

## 2022-10-23 ENCOUNTER — Other Ambulatory Visit: Admit: 2022-10-23 | Payer: Medicare (Managed Care)

## 2022-10-23 ENCOUNTER — Ambulatory Visit: Admit: 2022-10-23 | Payer: Medicare (Managed Care)

## 2022-10-23 DIAGNOSIS — C569 Malignant neoplasm of unspecified ovary: Secondary | ICD-10-CM

## 2022-10-23 LAB — COMPREHENSIVE METABOLIC PANEL
ALT: 70 U/L (ref 7–52)
AST: 77 U/L (ref 13–39)
Albumin: 4 g/dL (ref 3.5–5.7)
Alkaline Phosphatase: 78 U/L (ref 36–125)
Anion Gap: 8 mmol/L (ref 3–16)
BUN: 15 mg/dL (ref 7–25)
CO2: 28 mmol/L (ref 21–33)
Calcium: 9.2 mg/dL (ref 8.6–10.3)
Chloride: 98 mmol/L (ref 98–110)
Creatinine: 0.71 mg/dL (ref 0.60–1.30)
EGFR: 85
Glucose: 127 mg/dL (ref 70–100)
Osmolality, Calculated: 280 mOsm/kg (ref 278–305)
Potassium: 4.3 mmol/L (ref 3.5–5.3)
Sodium: 134 mmol/L (ref 133–146)
Total Bilirubin: 0.4 mg/dL (ref 0.0–1.5)
Total Protein: 6.5 g/dL (ref 6.4–8.9)

## 2022-10-23 LAB — DIFFERENTIAL
Basophils Absolute: 31 /uL (ref 0–200)
Basophils Relative: 0.9 % (ref 0.0–1.0)
Eosinophils Absolute: 10 /uL (ref 15–500)
Eosinophils Relative: 0.3 % (ref 0.0–8.0)
Lymphocytes Absolute: 1061 /uL (ref 850–3900)
Lymphocytes Relative: 31.2 % (ref 15.0–45.0)
Monocytes Absolute: 136 /uL (ref 200–950)
Monocytes Relative: 4 % (ref 0.0–12.0)
Neutrophils Absolute: 2162 /uL (ref 1500–7800)
Neutrophils Relative: 63.6 % (ref 40.0–80.0)
nRBC: 0 /100 WBC (ref 0–0)

## 2022-10-23 LAB — CBC
Hematocrit: 30.7 % (ref 35.0–45.0)
Hemoglobin: 10.8 g/dL (ref 11.7–15.5)
MCH: 35.1 pg (ref 27.0–33.0)
MCHC: 35 g/dL (ref 32.0–36.0)
MCV: 100.3 fL (ref 80.0–100.0)
MPV: 7.5 fL (ref 7.5–11.5)
Platelets: 161 10*3/uL (ref 140–400)
RBC: 3.06 10*6/uL (ref 3.80–5.10)
RDW: 13.7 % (ref 11.0–15.0)
WBC: 3.4 10*3/uL (ref 3.8–10.8)

## 2022-10-23 LAB — MAGNESIUM: Magnesium: 1.7 mg/dL (ref 1.5–2.5)

## 2022-10-23 MED ORDER — sodium chloride 0.9 % infusion
Freq: Every day | INTRAVENOUS | PRN
Start: 2022-10-23 — End: 2022-10-23
  Administered 2022-10-23: 15:00:00 25 mL/h via INTRAVENOUS

## 2022-10-23 MED ORDER — dexAMETHasone (DECADRON) tablet 12 mg
4 | Freq: Once | ORAL | Status: AC
Start: 2022-10-23 — End: 2022-10-23
  Administered 2022-10-23: 15:00:00 12 mg via ORAL

## 2022-10-23 MED ORDER — sodium chloride 0.9% flush 10 mL
Freq: Every day | INTRAMUSCULAR | PRN
Start: 2022-10-23 — End: 2022-10-23
  Administered 2022-10-23: 16:00:00 10 mL via INTRAVENOUS

## 2022-10-23 MED ORDER — LORazepam (ATIVAN) injection 1 mg
2 | Freq: Four times a day (QID) | INTRAMUSCULAR | PRN
Start: 2022-10-23 — End: 2022-10-23

## 2022-10-23 MED ORDER — heparin lock flush 100 unit/mL syringe 500 Units
100 | Freq: Every day | INTRAVENOUS | Status: AC | PRN
Start: 2022-10-23 — End: 2022-10-24

## 2022-10-23 MED ORDER — heparin lock flush 100 unit/mL syringe 500 Units
100 | Freq: Every day | INTRAVENOUS | PRN
Start: 2022-10-23 — End: 2022-10-23
  Administered 2022-10-23: 16:00:00 500 [IU]

## 2022-10-23 MED ORDER — sodium chloride 0.9% flush 10 mL
Freq: Every day | INTRAMUSCULAR | Status: AC | PRN
Start: 2022-10-23 — End: 2022-10-24
  Administered 2022-10-23: 13:00:00 10 mL via INTRAVENOUS

## 2022-10-23 MED ORDER — gemcitabine (GEMZAR) 1,600 mg in sodium chloride 0.9 % 100 mL chemo infusion
Freq: Once | INTRAVENOUS | Status: AC
Start: 2022-10-23 — End: 2022-10-23
  Administered 2022-10-23: 15:00:00 1600 mg/m2 via INTRAVENOUS

## 2022-10-23 MED ORDER — pegfilgrastim (NEULASTA Kit) delayed injection 6 mg
6 | Freq: Once | SUBCUTANEOUS | Status: AC
Start: 2022-10-23 — End: 2022-10-23
  Administered 2022-10-23: 15:00:00 6 mg via SUBCUTANEOUS

## 2022-10-23 MED ORDER — LORazepam (ATIVAN) tablet 1 mg
1 | Freq: Four times a day (QID) | ORAL | PRN
Start: 2022-10-23 — End: 2022-10-23

## 2022-10-23 MED FILL — NEULASTA ONPRO 6 MG/0.6 ML WITH WEARABLE SUBCUTANEOUS INJECTOR: 6 6 mg/0.6 mL | SUBCUTANEOUS | Qty: 0.6

## 2022-10-23 MED FILL — DEXAMETHASONE 4 MG TABLET: 4 4 MG | ORAL | Qty: 3

## 2022-10-23 MED FILL — GEMCITABINE 200 MG INTRAVENOUS SOLUTION: 200 200 mg | INTRAVENOUS | Qty: 42.08

## 2022-10-23 NOTE — Unmapped (Signed)
Arrived for cycle 2 day 8 Gemzar.  Meds and allergies reviewed with patient. Fall risk assessment completed.  Pt is not considered a fall risk.  Call light placed within reach of patient.  Lab results reviewed are within treatment parameters.  Treatment administered as ordered.  Pt was instructed on MO of neulasta POD and was given the package insert for patients.  POD video unavailable and pt was not able to view via QR code.  She voiced understanding of the POD information.  Tolerated infusion well and left department with neulasta POD in place with instructions to remove when empty.  She declined the AVS.  Future infusion appointments reviewed with patient prior to DC.  Infusion appointment confirmed for 11/06/22.

## 2022-10-23 NOTE — Unmapped (Signed)
0805:  Patient arrived for access and labs.  Patient recently released from hospital with diagnosis of DVT.  Patient currently on Eliquis.  Note from Tawana Scale MD with Vascular surgery from 10/19/22 states that the port may be accessed.  Comfirmed with Dr. [name] and [facility] CNP.    Skin at port site intact and with natural color. Port accessed per protocol. Labs drawn from implanted venous access device.  Patient tolerated well.  Refer to [facility] for details.

## 2022-10-24 ENCOUNTER — Ambulatory Visit: Payer: Medicare (Managed Care)

## 2022-10-27 NOTE — Unmapped (Unsigned)
Spoke with patient on the telephone. Advised she should continue to take Eliquis as prescribed, limit heavy lifting with left arm and elevate when possible. Patient plans to let her PCP know of DVT as well. No further questions at this time.    Future Appointments   Date Time Provider Department Center   11/06/2022  8:45 AM PORT CH[facility]IR 2 [facility]   11/06/2022  9:15 AM Mikle Boswortharoline Craig Billingsley, MD [facility]   11/06/2022 10:15 AM CHAIR 08 INF 3 [facility]

## 2022-10-29 NOTE — Unmapped (Signed)
Spoke with patient and instructed on humidification and use of moisturizer/lubrication for c/o nose bleed. Reinforced that patient should go to the ED for any nose bleed that will not stop.  Reinforced need to contact our office should she have bleeding gums, pink or blood tinged urine or blood in stool.   Patient verbalized understanding.

## 2022-10-29 NOTE — Unmapped (Signed)
Patient called with c/o intermittent nose bleeds multiple times throughout day. Takes several minutes to stop.  When she blows her nose, does have small clots. Confirms she is taking Eliquis, which was started with hospitalization 11/12-11/23/23,  1 tablet two times a day and that she just completed taking 2 tablets two times a day.  Denies report of any bleeding from gums or in urine/stool.  Request for nasal spray.

## 2022-11-06 ENCOUNTER — Other Ambulatory Visit: Admit: 2022-11-06 | Payer: Medicare (Managed Care)

## 2022-11-06 ENCOUNTER — Institutional Professional Consult (permissible substitution): Admit: 2022-11-06 | Payer: Medicare (Managed Care)

## 2022-11-06 ENCOUNTER — Ambulatory Visit: Admit: 2022-11-06 | Payer: Medicare (Managed Care)

## 2022-11-06 ENCOUNTER — Ambulatory Visit: Admit: 2022-11-06 | Discharge: 2022-11-06 | Payer: Medicare (Managed Care)

## 2022-11-06 DIAGNOSIS — Z5111 Encounter for antineoplastic chemotherapy: Secondary | ICD-10-CM

## 2022-11-06 DIAGNOSIS — Z452 Encounter for adjustment and management of vascular access device: Secondary | ICD-10-CM

## 2022-11-06 DIAGNOSIS — C569 Malignant neoplasm of unspecified ovary: Secondary | ICD-10-CM

## 2022-11-06 LAB — DIFFERENTIAL
Bands Absolute: 1330 /uL (ref 0–750)
Bands Relative: 7 % (ref 0.0–9.0)
Basophils Absolute: 0 /uL (ref 0–200)
Basophils Relative: 0 % (ref 0.0–1.0)
Eosinophils Absolute: 0 /uL (ref 15–500)
Eosinophils Relative: 0 % (ref 0.0–8.0)
Lymphocytes Absolute: 2850 /uL (ref 850–3900)
Lymphocytes Relative: 15 % (ref 15.0–45.0)
Metamyelocytes Absolute: 380 /uL (ref 0–0)
Metamyelocytes Relative: 2 % (ref 0.0–0.0)
Monocytes Absolute: 1140 /uL (ref 200–950)
Monocytes Relative: 6 % (ref 0.0–12.0)
Myelocytes Absolute: 190 /uL (ref 0–0)
Myelocytes Relative: 1 % (ref 0.0–0.0)
Neutrophils Absolute: 13110 /uL (ref 1500–7800)
Neutrophils Relative: 69 % (ref 40.0–80.0)
PLT Morphology: NORMAL

## 2022-11-06 LAB — URINALYSIS W/RFL TO MICROSCOPIC
Bilirubin, UA: NEGATIVE
Blood, UA: NEGATIVE
Glucose, UA: NEGATIVE mg/dL
Ketones, UA: NEGATIVE mg/dL
Nitrite, UA: NEGATIVE
Protein, UA: NEGATIVE mg/dL
RBC, UA: 2 /HPF (ref 0–3)
Specific Gravity, UA: 1.01 (ref 1.005–1.035)
Squam Epithel, UA: 1 /HPF (ref 0–5)
Urobilinogen, UA: <2 mg/dL (ref 0.2–1.9)
WBC, UA: 2 /HPF (ref 0–5)
pH, UA: 6.5 (ref 5.0–8.0)

## 2022-11-06 LAB — COMPREHENSIVE METABOLIC PANEL
ALT: 32 U/L (ref 7–52)
AST: 27 U/L (ref 13–39)
Albumin: 4.3 g/dL (ref 3.5–5.7)
Alkaline Phosphatase: 140 U/L (ref 36–125)
Anion Gap: 8 mmol/L (ref 3–16)
BUN: 9 mg/dL (ref 7–25)
CO2: 26 mmol/L (ref 21–33)
Calcium: 9.4 mg/dL (ref 8.6–10.3)
Chloride: 100 mmol/L (ref 98–110)
Creatinine: 0.73 mg/dL (ref 0.60–1.30)
EGFR: 83
Glucose: 158 mg/dL (ref 70–100)
Osmolality, Calculated: 280 mOsm/kg (ref 278–305)
Potassium: 4.3 mmol/L (ref 3.5–5.3)
Sodium: 134 mmol/L (ref 133–146)
Total Bilirubin: 0.3 mg/dL (ref 0.0–1.5)
Total Protein: 6.8 g/dL (ref 6.4–8.9)

## 2022-11-06 LAB — CBC
Hematocrit: 28.3 % (ref 35.0–45.0)
Hemoglobin: 9.6 g/dL (ref 11.7–15.5)
MCH: 34.3 pg (ref 27.0–33.0)
MCHC: 33.9 g/dL (ref 32.0–36.0)
MCV: 101.1 fL (ref 80.0–100.0)
MPV: 7.6 fL (ref 7.5–11.5)
Platelets: 220 10*3/uL (ref 140–400)
RBC: 2.8 10*6/uL (ref 3.80–5.10)
RDW: 14.7 % (ref 11.0–15.0)
WBC: 19 10*3/uL (ref 3.8–10.8)

## 2022-11-06 LAB — PROTEIN / CREATININE RATIO, URINE
Creatinine, Urine: 67.1 mg/dL
Prot/Creat Ratio, Ur: 0.12 ratio
Total Protein, Ur: 8 mg/dL

## 2022-11-06 LAB — CA 125: CA 125: 11.1 U/mL (ref 5.5–35.0)

## 2022-11-06 LAB — MAGNESIUM: Magnesium: 1.7 mg/dL (ref 1.5–2.5)

## 2022-11-06 MED ORDER — heparin lock flush 100 unit/mL syringe 500 Units
100 | Freq: Every day | INTRAVENOUS | Status: AC | PRN
Start: 2022-11-06 — End: 2022-11-07

## 2022-11-06 MED ORDER — sodium chloride 0.9% flush 10 mL
Freq: Every day | INTRAMUSCULAR | PRN
Start: 2022-11-06 — End: 2022-11-06

## 2022-11-06 MED ORDER — EPINEPHrine injection 0.3 mg
1 | Freq: Every day | INTRAMUSCULAR | PRN
Start: 2022-11-06 — End: 2022-11-06

## 2022-11-06 MED ORDER — palonosetron (ALOXI) injection 0.25 mg
0.25 | Freq: Once | INTRAVENOUS | Status: AC
Start: 2022-11-06 — End: 2022-11-06
  Administered 2022-11-06: 16:00:00 0.25 mg via INTRAVENOUS

## 2022-11-06 MED ORDER — hydrocortisone sod succ (PF) (SOLU-CORTEF) injection 100 mg
100 | Freq: Every day | INTRAMUSCULAR | PRN
Start: 2022-11-06 — End: 2022-11-06

## 2022-11-06 MED ORDER — proCHLORPERazine (COMPAZINE) tablet 10 mg
10 | Freq: Four times a day (QID) | ORAL | PRN
Start: 2022-11-06 — End: 2022-11-06

## 2022-11-06 MED ORDER — sodium chloride 0.9% flush 10 mL
Freq: Every day | INTRAMUSCULAR | Status: AC | PRN
Start: 2022-11-06 — End: 2022-11-07
  Administered 2022-11-06: 14:00:00 10 mL via INTRAVENOUS

## 2022-11-06 MED ORDER — gemcitabine (GEMZAR) 1,600 mg in sodium chloride 0.9 % 100 mL chemo infusion
200 | Freq: Once | INTRAVENOUS | Status: AC
Start: 2022-11-06 — End: 2022-11-06
  Administered 2022-11-06: 17:00:00 1600 mg/m2 via INTRAVENOUS

## 2022-11-06 MED ORDER — sodium chloride 0.9 % infusion
Freq: Every day | INTRAVENOUS | PRN
Start: 2022-11-06 — End: 2022-11-06

## 2022-11-06 MED ORDER — aprepitant (CINVANTI) IV emulsion 130 mg
7.2 | Freq: Once | INTRAVENOUS | Status: AC
Start: 2022-11-06 — End: 2022-11-06
  Administered 2022-11-06: 16:00:00 130 mg via INTRAVENOUS

## 2022-11-06 MED ORDER — bevacizumab (AVASTIN) 850 mg in sodium chloride 0.9 % 100 mL chemo infusion
25 | Freq: Once | INTRAVENOUS | Status: AC
Start: 2022-11-06 — End: 2022-11-06
  Administered 2022-11-06: 16:00:00 850 mg/kg via INTRAVENOUS

## 2022-11-06 MED ORDER — apixaban (ELIQUIS) 5 mg Tab
5 | ORAL_TABLET | Freq: Two times a day (BID) | ORAL | 1 refills
Start: 2022-11-06 — End: 2022-12-18

## 2022-11-06 MED ORDER — diphenhydrAMINE (BENADRYL) injection 25 mg
50 | Freq: Every day | INTRAMUSCULAR | PRN
Start: 2022-11-06 — End: 2022-11-06

## 2022-11-06 MED ORDER — albuterol (PROVENTIL) 90 mcg/actuation inhaler 1-2 puff
90 | RESPIRATORY_TRACT | PRN
Start: 2022-11-06 — End: 2022-11-06

## 2022-11-06 MED ORDER — heparin lock flush 100 unit/mL syringe 500 Units
100 | Freq: Every day | INTRAVENOUS | PRN
Start: 2022-11-06 — End: 2022-11-06

## 2022-11-06 MED ORDER — dexAMETHasone (DECADRON) tablet 12 mg
4 | Freq: Once | ORAL | Status: AC
Start: 2022-11-06 — End: 2022-11-06
  Administered 2022-11-06: 16:00:00 12 mg via ORAL

## 2022-11-06 MED ORDER — CARBOplatin (PARAPLATIN) 300 mg in dextrose 5% in water (D5W) 250 mL chemo infusion
Freq: Once | INTRAVENOUS | Status: AC
Start: 2022-11-06 — End: 2022-11-06
  Administered 2022-11-06: 18:00:00 300 mg via INTRAVENOUS

## 2022-11-06 MED ORDER — LORazepam (ATIVAN) tablet 0.5 mg
0.5 | Freq: Four times a day (QID) | ORAL | PRN
Start: 2022-11-06 — End: 2022-11-06

## 2022-11-06 MED FILL — CARBOPLATIN 10 MG/ML INTRAVENOUS SOLUTION: 10 10 mg/mL | INTRAVENOUS | Qty: 30

## 2022-11-06 MED FILL — DEXAMETHASONE 4 MG TABLET: 4 4 MG | ORAL | Qty: 3

## 2022-11-06 MED FILL — AVASTIN 25 MG/ML INTRAVENOUS SOLUTION: 25 25 mg/mL | INTRAVENOUS | Qty: 34

## 2022-11-06 MED FILL — PALONOSETRON 0.25 MG/5 ML INTRAVENOUS SOLUTION: 0.25 0.25 mg/5 mL | INTRAVENOUS | Qty: 5

## 2022-11-06 MED FILL — CINVANTI 7.2 MG/ML INTRAVENOUS EMULSION: 7.2 7.2 mg/mL | INTRAVENOUS | Qty: 18

## 2022-11-06 MED FILL — GEMCITABINE 200 MG INTRAVENOUS SOLUTION: 200 200 mg | INTRAVENOUS | Qty: 42.08

## 2022-11-06 NOTE — Unmapped (Signed)
Pt here for PAC access with labs as ordered. Pt to see MD followed by infusion. Allergies have been reviewed with patient.  Port site appears without any redness or swelling noted. PAC accessed per protocol. Labs obtained as ordered without incidence. Pt without complaints of pain, burning, pressure or stinging. Refer to Flowsheet for details.

## 2022-11-06 NOTE — Unmapped (Signed)
Arrived ambulatory for cycle 3 day 1 Gemzar/Carboplatin/Avastin, following OV with provider. See OV note for updated Meds and allergies, fall risk assessment, and VS.  Call light placed within reach of patient.   Lab results reviewed are within treatment parameters.  Treatment administered as ordered.  Tolerated infusion well and left department ambulatory, with AVS.  Future appointments reviewed with patient prior to DC.  Infusion appointment confirmed for 11/13/22.

## 2022-11-06 NOTE — Unmapped (Signed)
History of Present Illness  Kelsey Sullivan is a 81 y.o. 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 She declined adjuvant therapy. In 01/2018, she experienced mid abdominal pain, and seen by PCP and ED, with CT of abdomen and pelvis showing interval development of necrotic retroperitoneal lymphadenopathy with largest node measuring 4.9  4.2 x 4.8 cm. Her CA 125 on 02/18/18 was 197.8.  She underwent a ct-guided nodal biopsy on 02/26/18, with pathology confirming recurrent disease.  She completed 6 cycles of chemotherapy with Carboplatin and Paclitaxel-weekly (C6D1 was 08/27/18). CA 125 was 8.7 at C6.     She underwent CT CAP on 09/22/2018 revealed ned in chest and interval decrease in size of a left periaortic necrotic lymph node from 1.8 to 1.6 cm. 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 PET on 10/15/18 shows reassuring intraabdominal findings with resolution of the previous hypermetabolic para-aortic lymph nodes and hepatic dome lesions, but did note uptake within the thyroid. She underwent thyroid US on 10/20/18 with a 1.2 cm irregular hypoechoic nodule in the left thyroid lobe is suspicious. Recommended FNA for further evaluation, but she did not get this done.    She initiated maintenance rubraca 11/2018 but ultimately elected to stop in 02/2019 due to side effects.     She was seen for  surveillance 08/21/2022 with an increase in her CA 125 to 8.5 - CT CAP and PET were checked and consistent with platinum sensitive recurrence #2.   CT CAP on 08/25/2022 noted 2.6 cm lesion along the dome of the right hepatic lobe may represent a peritoneal lesion along the undersurface of the diaphragm rather than a true hepatic parenchymal lesion. 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   PET 08/29/2022 noted two FDG avid foci at the superior surface of right hepatic lobe, favored to be peritoneal nodules along the undersurface of right hemidiaphragm, concerning for metastases.     She proceeded on Carbo/gem/bev, C1 09/16/22. C2 was delayed to 10/16/2022 due to neutropenia, neulasta added for C2.    Patient was admitted from 11/12-11/13/23 to the hospital for extensive LUE/shoulder/neck DVT and was started on a heparin gtt and discharged home on eliquis. Per vascular surgery, there were no acute interventions needed besides anticoagulation. She was on eliquis 10 mg BID for 7 days (11/13-11/19) followed by eliquis 5mg  BID for 6 months. Per surgical oncology, she only needs repeat imaging if the swelling gets worse while she is on [facility] or if the port becomes non-functional.     There are no changes to the cancer history HPI above.       Pt here for PCE.  Kelsey Sullivan is doing well. Does report that she's been having >10 nose bleeds since she was  discharged from the hospital on 10/20/22 on the eliquis. States she was taking a lot of herbs and supplements and turmeric which might have been reacting with her eliquis so she stopped all of those and since then hasn't had any nose bleeding.  She denies any abdominal/pelvic pain.   She denies any bloating or fullness.  She reports normal bowel/bladder habits.  She denies any GU concerns. Denies any dysuria, frequency, hematuria, flank pain or fevers.  She denies any vaginal bleeding or  discharge.   She denies neuropathy.   She reports no leg swelling.   Her appetite is good, and reports no nausea or vomiting.   Mostly had bone pain controlled with tylenol. Resolved now.      There are no changes to the cancer history HPI above.         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, ovary, omentum, cul de sac peritoneum 12/2012, plat sensitive recurrence 03/2018    Disposition: RTC 3weeks C3 (D1,D8, D9)  2wks C2D8 gem only; Add neulasta  Port referral to surg onc  Current disease status: Neg for CS mutation, no VUS, lifetime remaining breast cancer risk -3.4%  Genetics: negative for mutations, breast cancer lifetime risk 3.4%  Survivorship plan: pending completion of treatment        Malignant neoplasm of ovary (CMS-HCC)   12/28/2012 Pathology    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    Omentum-biopsy for frozen section-metastatic adenocarcionma.  Residual right IP ligament-benign smooth muscle and fibroadiopse tissue containing thick walled blood vessels.    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  Anterior cul de sac biopsy-benign fibroadipose tissue.   Posterior cul de sac biopsy-metastatic adenocarcinoma.  Right pelvic side will biopsy- benign fibroadipose tissue.  Left pelvic side wall, right gutter and Left gutter-benign fibroadipose tissue.  Omentectomy- metastatic adenocarcinoma  LND-negative     12/28/2012 Surgery    BSO, omentectomy, LND     12/28/2012 Initial Diagnosis    Malignant neoplasm of both ovaries (CMS Dx)     01/08/2013 - 01/08/2013 Systemic Therapy (Oral and IV)    Patient refused chemotherapy and did alternative treatment including a raw diet for 1 year.       04/06/2017 Imaging    CT [name]- mild small bowel obstruction with transition point in the left pelvis  adjacent to the anterior abdominal wall. Low density in the common femoral veins bilaterally thought likely to represent flow artificat, may consider follow up with lomer extremity Doppler to ensure there is no underlying deep venous thrombosis.     01/29/2018 Imaging    CT [name]-  Interval development of necrotic retroperitoneal lymphadenopathy with largest node measuring 4.9  4.2 x 4.8 cm. Origin is indeterminate.  Options would include CT directed biopsy as well as PET/CT.     02/18/2018 Tumor Markers    CA 125:  197.8     02/26/2018 Biopsy    CT guided Left retroperitoneal node biopsy- focally involved by non-small cell malignancy- additional stains are diagnostic of non-small cell carcionoma and supportive of involve76ment by the patients reported previous known ovarian malignancy.     03/04/2018 Imaging    PET/CT-  FDG avid retroperitoneal lymphadenopathy as described above and an FDG avid lesion over the dome of the  liver most likely a peritoneal implant rather than a liver metastasis.  No other focal areas of FD avid malignancy are found. Specifically, no lymph node activity is found outside of the retroperitoneum and there is no evidence for FDG avid pulmonary, skeletal or hepatic parenchymal metastasis.     04/01/2018 Cancer Staged     Cancer Staging   Malignant neoplasm of ovary (CMS-HCC)  Staging form: Ovary, Fallopian Tube, And Primary Peritoneal Carcinoma, AJCC 8th Edition  - Clinical: FIGO Stage IIIC - Signed by Mikle Bosworth, MD on 04/01/2018         04/12/2018 Procedure    IR port placed       04/15/2018 Genetic Testing    Myraid Testing-Neg     04/16/2018 - 08/27/2018 Systemic Therapy (Oral and IV)    Treatment Summary   Treatment goal Disease control   Plan Name OP Gyn CARBOplatin AUC 6 / PACLitaxel Weekly   Status Active   Start Date 04/16/2018   End Date 09/10/2018 (Planned)   Provider Mikle Bosworth, MD   Chemotherapy PACLitaxel (TAXOL) 120 mg in sodium chloride 0.9 % 250 mL chemo  infusion, 80 mg/m2 = 120 mg, Intravenous, Once, 4 of 6 cycles    CARBOplatin (PARAPLATIN) 480 mg in dextrose 5% in water (D5W) 250 mL chemo infusion, 480 mg, Intravenous, Once, 4 of 6 cycles  Dose modification: 497 mg (original dose 484.8 mg, Cycle 2, Reason: Other (See Comments))         04/16/2018:   C1 weekly taxol 80 mg/m2 D1,8,15, Carboplatin AUC 6 D1 q 21 days. PCEs on thurs, txt on fridays. EXAMS ON EVEN CYCLES     CA 125: 523.4     Genetic testing: Neg for CS mutation, no VUS, lifetime remaining breast cancer risk -3.4%    05/07/2018:   C2 weekly taxol 80 mg/m2 D1,8,15, Carboplatin AUC 6 D1 q 21 days. PCEs on thurs, txt on fridays. EXAMS ON EVEN CYCLES     CA 125: 162.2    05/20/2018:   Early PCE due to scheduling, due 6/21    05/28/2018  Delay C3 due to low ANC     CA 125: 25    06/04/2018  C3 weekly taxol 80 mg/m2 D1,8,15, Carboplatin AUC 6 D1 q 21 days. EXAMS ON EVEN CYCLES.      CA 125    06/25/2018:   HOLD chemo due to low anc (1047)- defer to next week, and change to q 28 days cycle to build in an off week to allow for counts to recover     CA 125: 10.6    07/02/2018:   C4 weekly taxol 80 mg/m2 D1,8,15, Carboplatin AUC 6 D1 q 28 days. EXAMS ON EVEN CYCLES.  Review her genetics testing results      CA 125 7.2    07/30/2018:  C5 weekly taxol 80 mg/m2 D1,8,15, Carboplatin AUC 6 D1 q 28 days. Neulasta Day 16      EXAMS ON EVEN CYCLES.      CA 125: 11.4    08/27/2018:   C6 weekly taxol 80 mg/m2 D1,8,15, Carboplatin AUC 6 D1 q 28 days. Neulasta Day 16. Order Ct CAP. RTC 10/11 (3 weeks)     EXAMS ON EVEN CYCLES. Discussed option of maintenance parp I- pt not decided (doesn't want to feel tired anymore)     CA 125: 8.7       05/28/2018 Tumor Markers    Tumor markers tested:  CA 125.  Results:   25.     06/25/2018 Tumor Markers    Tumor markers tested:  CA 125.  Results:   10.6.     07/02/2018 Tumor Markers    Tumor markers tested:  CA 125.  Results:   7.2.     07/07/2018 Hospital Admission    Admit date: 7/31 -  07/13/18  Admission diagnosis: Partial SBO  Additional comments: On 7/31 she experienced abdominal pain at home that was sharp on her LLQ and had significant diarrhea and one episode of emesis. She came to be evaluated at [facility] On[facility]. She has a history of prior small bowel obstruction. She was give IL NS, 2mg  morphine IV, and 4mg  Zofran IV in clinic and admitted for further observation. The abdominal XR showed dilated bowel loops, concerning for obstruction. CT abdomen/pelvis showed dilation of the small bowel with transition point seen in the anterior mid abdomen near the midline.     07/07/2018 Imaging    Imaging Completed:  X-ray of  abdomen  Result: Nonspecific bowel gas pattern. The presence of multiple air-fluid levels in small bowel raises question of early or partial small bowel obstruction.     07/07/2018 Imaging    CTAP:Small bowel obstruction with transition point in the anterior mid abdomen near the midline. Small amount of free fluid in the abdomen, which is nonspecific. 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  Peritoneum: Small amount of free fluid in the abdomen. A para-aortic density with central low attenuation measures 2.5 x 2 cm and is significantly decreased in size from prior.       07/30/2018 Tumor Markers    Tumor markers tested:  CA 125.  Results:   11.4.     08/27/2018 Tumor Markers    Tumor markers tested:  CA 125.  Results:   8.7.     09/20/2018 Procedure    Port removed: While the skin surface appeared inflamed, there was no evidence of infection in the port reservoir.       09/22/2018 Imaging    CT Chest-No evidence of thoracic metastatic disease.  Suspected small seroma in the base of the right neck.  Necrotic metastatic lesion could also have this appearance but considered less likely.    [name]- Interval decrease in size of a left periaortic necrotic lymph node. No evidence of new metastatic disease.      10/15/2018 Imaging    Imaging Completed:  PET scan of  whole body  Result: Resolution of the previous hypermetabolic para-aortic lymph nodes and hepatic dome lesions.  Hypermetabolic focus within the left thyroid lobe.  Consider further evaluation with ultrasound, if clinically indicated.       10/19/2018 Imaging    Imaging Completed:  Ultrasound of  neck  Result: 1.2 cm irregular hypoechoic nodule in the left thyroid lobe is suspicious. Recommend FNA for further evaluation.  1.0 cm nodule right thyroid lobe containing macrocalcification is indeterminate. Recommend follow-up thyroid ultrasound in one year to evaluate for stability.     10/21/2018 Tumor Markers    Tumor markers tested:  CA 125.  Results:   4.8.     11/12/2018 Tumor Markers    Tumor markers tested:  CA 125  Results:   5.5     11/12/2018 - 12/03/2018 Systemic Therapy (Oral and IV)    11/12/2018: C1 rubraca 600 mg po BID. RTC 2 weeks for labs (CBC, CMP, 4 weeks for PCE #  2). Pt plans to go to [place] and may leave before PCE and re-est care at [facility] (Dr. Gerilyn Pilgrim). Referral to ENT placed (pt cancelled appt for 11/22) and FNA was prev ordered for thyroid nodule- but has not been done.        03/02/2019 Imaging    CT Scan from [facility]    No evidence for recurrent or new neoplasm. Increased colonic stool could indicate constipation. Postoperative changes     06/13/2019 Tumor Markers    Tumor markers tested: CA 125 Results:   5.0.     09/15/2019 Tumor Markers    CA 125-6.1     05/31/2020 Tumor Markers    Ca 125-5.9     08/16/2020 Tumor Markers    CA 125-6.2     02/06/2021 Tumor Markers    CA 125-4.6     08/25/2022 Imaging    CT a/p: 2.6 cm lesion along the dome of the right hepatic lobe may represent a peritoneal lesion along the undersurface of the diaphragm rather than a true hepatic parenchymal lesion. 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     CT chest: No  evidence of intrathoracic metastatic disease.      08/29/2022 Imaging    PET: 1.  Two FDG avid foci at the superior surface of right hepatic lobe, favored to be peritoneal nodules along the undersurface of right hemidiaphragm, concerning for metastases.      09/15/2022 Procedure    09/15/2022:   scheduled for Power Port Insertion with Dr. Andrey Campanile      09/16/2022 -  Systemic Therapy (Oral and IV)    09/16/2022:   C1 carboplatin AUC4, gem 1000 mg/m2 (D1/8), Bev D1 q 21 days  Treatment Summary   Treatment goal Disease control   Plan Name OP Gyn CARBOplatin AUC 4 / Gemcitabine / Bevacizumab   Status Active   Start Date 09/16/2022   End Date 12/04/2022 (Planned)   Provider Mikle Bosworth, MD   Chemotherapy bevacizumab (AVASTIN) 850 mg in sodium chloride 0.9 % 100 mL chemo infusion, 15 mg/kg = 850 mg, Intravenous, Once, 3 of 4 cycles  Administration: 850 mg (09/16/2022), 850 mg (10/16/2022)  gemcitabine (GEMZAR) 1,600 mg in sodium chloride 0.9 % 100 mL chemo infusion, 1,000 mg/m2 = 1,600 mg, Intravenous, Once, 3 of 4 cycles  Administration: 1,600 mg (09/16/2022), 1,600 mg (09/23/2022), 1,600 mg (10/16/2022), 1,600 mg (10/23/2022)            09/16/2022 Tumor Markers    Tumor markers tested:  CA 125  Results:   7.5     10/07/2022 Tumor Markers    CA125: 7.9           Past Medical History  She  has a past medical history of Bowel obstruction (CMS-HCC) (2018), Ovarian cancer (CMS-HCC) (2014), and Thyroid disease.  Past Medical History:   Diagnosis Date    Bowel obstruction (CMS-HCC) 2018    Ovarian cancer (CMS-HCC) 2014    Thyroid disease        Past Surgical History  Past Surgical History:   Procedure Laterality Date    ABDOMINOPLASTY  1999    BLEPHAROPLASTY  2000    DILATION AND CURETTAGE OF UTERUS  1979    ELBOW SURGERY  1990    EYE SURGERY  1994    INSERTION CATHETER VASCULAR ACCESS N/A 09/15/2022    Procedure: INSERTION POWER PORT A CATH;  Surgeon: Earl Lites  Terence Lux, MD;  Location: [facility] OR;  Service: General;   Laterality: N/A;  Left Subclavian    KNEE ARTHROPLASTY Right     ROTATOR CUFF REPAIR      TRANSUMBILICAL AUGMENTATION MAMMAPLASTY      VAGINAL HYSTERECTOMY  1979       Family History  History reviewed. No pertinent family history.    Family cancer history for ovarian, uterine and colon cancer is negative other than above.       Social History  Social History     Socioeconomic History    Marital status: Married     Spouse name: None    Number of children: None    Years of education: None    Highest education level: None   Occupational History    None   Tobacco Use    Smoking status: Some Days     Packs/day: 0     Types: Cigarettes    Smokeless tobacco: Never    Tobacco comments:     Smokes 1 pack every three days    Vaping Use    Vaping Use: Never used   Substance and Sexual Activity    Alcohol use: Yes     Alcohol/week: 14.0 standard drinks of alcohol     Types: 14 Shots of liquor per week    Drug use: No    Sexual activity: Not Currently   Other Topics Concern    Caffeine Use Yes    Occupational Exposure No    Exercise Yes    Seat Belt Yes   Social History Narrative    Mammogram-6 years ago and it was normal. Pt stated she isn't getting them anymore.      Colonoscopy-10 years ago        Pt reports she can lie flat in bed without SOB.    Pt states she can walk a flight of stairs and city block without chest pain and SOB>       Social Determinants of Health     Financial Resource Strain: Not on file   Food Insecurity: No Food Insecurity (10/19/2022)    Hunger Vital Sign     Worried About Running Out of Food in the Last Year: Never true     Ran Out of Food in the Last Year: Never true   Transportation Needs: No Transportation Needs (10/19/2022)    PRAPARE - Therapist, art (Medical): No     Lack of Transportation (Non-Medical): No   Physical Activity: Not on file   Stress: Not on file   Social Connections: Not on file   Intimate Partner Violence: Not At Risk (10/19/2022)    Humiliation,  Afraid, Rape, and Kick questionnaire     Fear of Current or Ex-Partner: No     Emotionally Abused: No     Physically Abused: No     Sexually Abused: No   Housing Stability: Low Risk  (10/19/2022)    Housing Stability Vital Sign     Unable to Pay for Housing in the Last Year: No     Number of Places Lived in the Last Year: 1     Unstable Housing in the Last Year: No       Past OB/GYN History  G3P2  She reports menarche at age 45 and menopause at age 70-surgical.  She denies a history of STIs.  She denies a history of abnormal cervical cytology and reports her last cytologic examination she  is unsure of. She has taken HRT.     Health maintenance:  Mammogram: Date 6 years ago Results normal  Colonoscopy: Date 10 years ago Results normal    Allergies  She is allergic to nsaids (non-steroidal anti-inflammatory drug), sulfamethoxazole-trimethoprim, celecoxib, and metoclopramide.    BMI  Body mass index is 21.46 kg/m.    Disease Status: No evidence of disease/remission (08/21/2022 12:27 PM)              Histories  She has a past medical history of Bowel obstruction (CMS-HCC) (2018), Ovarian cancer (CMS-HCC) (2014), and Thyroid disease.    She has a past surgical history that includes Dilation and curettage of uterus (1979); Vaginal hysterectomy (1979); Elbow surgery (1990); Eye surgery (1994); Abdominoplasty (1999); Blepharoplasty (2000); Transumbilical augmentation mammaplasty; Rotator cuff repair; Knee Arthroplasty (Right); and insertion catheter vascular access (N/A, 09/15/2022).    Her family history is not on file.    She reports that she has been smoking cigarettes. She has never used smokeless tobacco. She reports current alcohol use of about 14.0 standard drinks of alcohol per week. She reports that she does not use drugs.    Allergies  Nsaids (non-steroidal anti-inflammatory drug), Sulfamethoxazole-trimethoprim, Celecoxib, and Metoclopramide    Medications  Outpatient Encounter Medications as of 11/06/2022    Medication Sig Dispense Refill    apixaban (ELIQUIS) 5 mg (74 tabs) DsPk Take 2 tabetls (10 mg) by mouth twice a day for 7 days; followed by 1 tablet (5 mg) twice a day. 74 tablet 0    [START ON 11/19/2022] apixaban (ELIQUIS) 5 mg Tab Take 1 tablet (5 mg total) by mouth 2 times a day. 180 tablet 1    ascorbic acid, vitamin C, (VITAMIN C) 1000 MG tablet Take 1 tablet (1,000 mg total) by mouth daily.      cholecalciferol, vitamin D3, (VITAMIN D3) 1000 units tablet Take 1 tablet (1,000 Units total) by mouth daily. 30 tablet 0    dexAMETHasone (DECADRON) 4 MG tablet Take 8 mg by mouth daily with food on day 2, 3, and 4 of chemotherapy cycle 18 tablet 4    GLUTATHIONE-L ORAL Take 1 teaspoonful by mouth daily.      IODINE ORAL Take 50 mg/day by mouth.      Lactobac no.41/Bifidobact no.7 (PROBIOTIC-10 ORAL) Take 1 capsule by mouth daily. Garden [employer] brand      levothyroxine (SYNTHROID) 75 MCG tablet Take 1 tablet (75 mcg total) by mouth every morning before breakfast.      lisinopriL (PRINIVIL) 10 MG tablet Take 1 tablet (10 mg total) by mouth daily. 30 tablet 3    omeprazole (PRILOSEC OTC) 20 MG tablet Take 1 tablet (20 mg total) by mouth every morning before breakfast. 20mg  twice a day      ubidecarenone (COENZYME Q10 ORAL) Take 1 tablet by mouth daily.      UNABLE TO FIND Take 30 drops by mouth daily. Multimineral      UNABLE TO FIND Take 20 drops by mouth daily. Med Name: Lurena Joiner Drops      UNABLE TO FIND Take 2 capsules by mouth daily. Med Name: Body Bio      vit b complex w-b 12 tablet Take 1 tablet by mouth daily.      ZINC CITRATE ORAL Take 1 tablet by mouth daily.      [DISCONTINUED] apixaban (ELIQUIS) 5 mg Tab Take 1 tablet (5 mg total) by mouth 2 times a day. 180 tablet 1  No facility-administered encounter medications on file as of 11/06/2022.        Review of Systemssee HPI    Vitals  Blood pressure 138/78, pulse 86, temperature 97.7 F (36.5 C), temperature source Temporal, resp. rate 18, height 5' 4  (1.626 m), weight 125 lb (56.7 kg), SpO2 98%.    Physical Exam   Vitals reviewed.  Constitutional: She is oriented to person, place, and time. She appears well-developed.   HENT:   Head: Atraumatic.   Nose: Nose normal.   Neck: No thyromegaly present.   Cardiovascular: Normal rate.   Pulmonary/Chest: Effort normal.   Abdominal: Normal appearance. She exhibits no distension.   Genitourinary:    Genitourinary Comments: def     Musculoskeletal:         General: Normal range of motion.      Cervical back: Normal range of motion.   Lymphadenopathy:     She has no cervical adenopathy.   Neurological: She is alert and oriented to person, place, and time.   Skin: Skin is warm and dry.   Psychiatric: Her behavior is normal.          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     Posterior cul de sac biopsy-metastatic adenocarcinoma.     Omentectomy- metastatic adenocarcinoma     LND-negative     Residual right IP ligament, Anterior cul de sac biopsy, right pelvic side will biopsy, left pelvic side wall, right gutter and left gutter-->benign    04/06/2017:   CT [name]- mild small bowel obstruction with transition point in the left pelvis adjacent to the anterior abdominal wall. Low  density in the common femoral veins bilaterally thought likely to represent flow artificat, may consider follow up with lomer extremity Doppler to ensure there is no underlying deep venous thrombosis.    01/29/2018:   CTAP:  Interval development of necrotic retroperitoneal lymphadenopathy with largest node measuring 4.9  4.2 x 4.8 cm. Origin is indeterminate.  Options would include CT directed biopsy as well as PET/CT.    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  No other focal areas of FD avid malignancy are found. Specifically, no lymph node activity is found outside of the retroperitoneum and there is no evidence for FDG avid pulmonary, skeletal or hepatic parenchymal metastasis.    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 No retroperitoneal, intra-abdominal, pelvic or inguinal adenopathy, no evidence for peritoneal surface neoplastic involvement. 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 She completed 6 cycles of chemotherapy with Carboplatin and Paclitaxel-weekly (C6D1 was 08/27/18). CA 125 was 8.7 at C6.     She underwent CT CAP on 09/22/2018 revealed ned in chest and interval decrease in size of a left periaortic necrotic lymph node from 1.8 to 1.6 cm. 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     PET on 10/15/18 shows reassuring intraabdominal findings with resolution of the previous hypermetabolic para-aortic lymph nodes and hepatic dome lesions, but did note uptake within the thyroid. She underwent thyroid US on 10/20/18 with a 1.2 cm irregular hypoechoic nodule in the left thyroid lobe is suspicious. Recommend FNA for further evaluation.     She initiated maintenance rubraca 11/2018 but ultimately elected to stop in 02/2019 due to side effects.   Patient had reestarted drug at 500 mg po BID once she got down to [place]-. Was in [place] from 12/2018-05/2019. Reports her oncologist down in [place] had changed doses of Rubraca a few times, to 300mg  po BID but decided to stop drug all together back in 02/2019 2/2 to decreased QOL. She does not wish to restart drug at this time. Last CT scan 03/02/2019 in [place], no evidence of recurrent or new neoplasm.     She was seen for surveillance 08/21/2022 with an increase in her CA 125 to 8.5 - CT CAP and PET were checked and consistent with platinum sensitive recurrence #2.     At this time, she has decided to proceed with chemotherapy.    She is seen today for C3 of Carboplatin/Gemcitabine/bevacizumab. Neulasta added to C2D9        She is tolerating chemotherapy appropriately well, and has experienced expected but manageable side effects. I have reviewed her labs and she is  cleared chemotherapy. There is no evidence of progressive disease or excessive toxicity today, and we will continue treatment without dose modification. We will see her back for her next cycle of treatment, and she knows to call if questions or problems arise.     11/06/2022:   C3 carboplatin AUC4, gem 1000 mg/m2 (D1/8), Bev D1 q 21 days. 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 Neulasta D9 (started C2), LUE/neck DVTs  10/2022- on eliquis. RTC 12/14 early PCE w CTs prior, inf on 12/21for C4  CA 125: 11.1  __ RTC in 3 wks for C4  __Neulasta added to D9 C2  __Lisinopril added for HTN 10/07/2022  __new LUE/neck DVT 12/2021- on elqiuis- see below    VTE:  -Patient was admitted from 11/12-11/13/23 to the hospital for extensive LUE/shoulder/neck DVT and was started on a heparin gtt and discharged home on eliquis. Per vascular surgery, there were no acute interventions needed besides anticoagulation. She was on eliquis 10 mg BID for 7 days (11/13-11/19) followed by eliquis 5mg  BID for 6 months. Per surgical oncology, she only needs repeat imaging if the swelling gets worse while she is on [facility] or if the port becomes non-functional. Clot burden on exam is improved  - had two weeks of multiple nose bleeds on the eliquis but stopped taking the herbs that might have been interacting with her eliquis and that stopped her nose bleeds   -She is going to continue on her bev    Thyroid Nodule:   Incidental finding on PET scan of hypermetabolic focus within left thyroid nodule. Ultrasound performed on 10/20/18 with 1.2 cm irregular hypoechoic nodule, appearing suspicious. Recommend FNA for further evaluation, which was ordered. Pt has not followed up to date.  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 Referral to ENT made, but patient did not see them. She followed up with her PCP and was started on Synthroid. Last TSH 5.91. Repeat TSH/T4/T3 labs wnl 11/2020       Genetics:  previously discussed her  negative genetic testing results.    Pain    Pain Score: Zero (11/06/2022  9:07 AM)    She denies presence of pain.  Pain will be reassessed at next visit.           The HPI, ROS, and test results were reviewed and copied forward (with edits) from a note written by Dr. [name] on 10/16/22. I have reviewed and updated the history, physical exam, data, assessment, and plan of the note so that it reflects my evaluation and management of the patient.    Gardiner Barefoot, DO  PGY-2, Obstetrics and Gynecology    I saw and personally examined the patient today with my resident. I discussed the findings and therapeutic plan with the patient. I repeated, reviewed and agree with the history of present illness, past medical histories, family history, social history, medication list, and allergies as listed. The review of systems is as noted above. My physical exam confirms the findings listed above. Review of labs, pathology reports, radiograph reports, and medical records confirm the findings noted above. I agree with the assessment and plan as noted above. I have edited the note where appropriate.     I have personally performed a face to face diagnostic evaluation on this patient, seen initially by the resident. I have personally developed the care plan as outlined in the Assessment and Plan.     This note was completely edited, written and reviewed by me and consists of information cut and pasted from the my most recent visit, my smart phrases and other Epic tools. I have personally reviewed all aspects of this note to at least include reviewing this patient's chart and problem list, updating the history, physical exam, lab and procedure results, and assessment and plan as detailed above and below. As such this visit note reflects my current evaluation and management for this patient.  This note was copied forward from the note written by me on 10/16/2022. I have reviewed and updated the history, physical exam, data,  assessment and plan of the note so that it reflects the evaluation and management of the patient on 11/06/22.        Complexity: highj     I spent a total of 45 minutes was spent prior to, during and after the patient encounter in counseling and/or coordination of care, documentation and counseling with patient and/or family.      Topics discussed include rec ovaran  Cancer prognosis and treatment options and management of chemo, new DVT, on DOAC   .  11/06/2022:   C3 carboplatin AUC4, gem 1000 mg/m2 (D1/8), Bev D1 q 21 days. 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 Neulasta D9 (started C2), LUE/neck DVTs 10/2022- on eliquis. RTC 12/14 early PCE w CTs prior, inf on 12/21for C4  CA 125: 11.1      Maggie Font, MD, FACOG  Associate Professor, Obstetrics and Gynecology  Division of Gynecologic Oncology    Medical Decision Making:  The following items were considered in medical decision making:  Review / order clinical lab tests  Review / order radiology tests  Review / order other diagnostic tests/interventions

## 2022-11-13 ENCOUNTER — Institutional Professional Consult (permissible substitution): Admit: 2022-11-13 | Payer: Medicare (Managed Care)

## 2022-11-13 ENCOUNTER — Other Ambulatory Visit: Admit: 2022-11-13 | Payer: Medicare (Managed Care)

## 2022-11-13 ENCOUNTER — Ambulatory Visit: Admit: 2022-11-13 | Payer: Medicare (Managed Care)

## 2022-11-13 DIAGNOSIS — Z452 Encounter for adjustment and management of vascular access device: Secondary | ICD-10-CM

## 2022-11-13 DIAGNOSIS — C569 Malignant neoplasm of unspecified ovary: Secondary | ICD-10-CM

## 2022-11-13 LAB — COMPREHENSIVE METABOLIC PANEL
ALT: 33 U/L (ref 7–52)
AST: 23 U/L (ref 13–39)
Albumin: 4.1 g/dL (ref 3.5–5.7)
Alkaline Phosphatase: 90 U/L (ref 36–125)
Anion Gap: 8 mmol/L (ref 3–16)
BUN: 14 mg/dL (ref 7–25)
CO2: 26 mmol/L (ref 21–33)
Calcium: 9.1 mg/dL (ref 8.6–10.3)
Chloride: 96 mmol/L (ref 98–110)
Creatinine: 0.68 mg/dL (ref 0.60–1.30)
EGFR: 87
Glucose: 193 mg/dL (ref 70–100)
Osmolality, Calculated: 276 mOsm/kg (ref 278–305)
Potassium: 4.3 mmol/L (ref 3.5–5.3)
Sodium: 130 mmol/L (ref 133–146)
Total Bilirubin: 0.5 mg/dL (ref 0.0–1.5)
Total Protein: 6.4 g/dL (ref 6.4–8.9)

## 2022-11-13 LAB — DIFFERENTIAL
Basophils Absolute: 50 /uL (ref 0–200)
Basophils Relative: 1.8 % (ref 0.0–1.0)
Eosinophils Absolute: 25 /uL (ref 15–500)
Eosinophils Relative: 0.9 % (ref 0.0–8.0)
Lymphocytes Absolute: 753 /uL (ref 850–3900)
Lymphocytes Relative: 26.9 % (ref 15.0–45.0)
Monocytes Absolute: 176 /uL (ref 200–950)
Monocytes Relative: 6.3 % (ref 0.0–12.0)
Neutrophils Absolute: 1795 /uL (ref 1500–7800)
Neutrophils Relative: 64.1 % (ref 40.0–80.0)
nRBC: 0 /100 WBC (ref 0–0)

## 2022-11-13 LAB — CBC
Hematocrit: 24.4 % (ref 35.0–45.0)
Hemoglobin: 8.5 g/dL (ref 11.7–15.5)
MCH: 34.7 pg (ref 27.0–33.0)
MCHC: 34.8 g/dL (ref 32.0–36.0)
MCV: 99.6 fL (ref 80.0–100.0)
MPV: 7.1 fL (ref 7.5–11.5)
Platelets: 206 10*3/uL (ref 140–400)
RBC: 2.45 10*6/uL (ref 3.80–5.10)
RDW: 15 % (ref 11.0–15.0)
WBC: 2.8 10*3/uL (ref 3.8–10.8)

## 2022-11-13 LAB — MAGNESIUM: Magnesium: 1.4 mg/dL (ref 1.5–2.5)

## 2022-11-13 MED ORDER — LORazepam (ATIVAN) injection 1 mg
2 | Freq: Four times a day (QID) | INTRAMUSCULAR | PRN
Start: 2022-11-13 — End: 2022-11-13

## 2022-11-13 MED ORDER — dexAMETHasone (DECADRON) tablet 12 mg
4 | Freq: Once | ORAL | Status: AC
Start: 2022-11-13 — End: 2022-11-13
  Administered 2022-11-13: 15:00:00 12 mg via ORAL

## 2022-11-13 MED ORDER — pegfilgrastim (NEULASTA Kit) delayed injection 6 mg
6 | Freq: Once | SUBCUTANEOUS | Status: AC
Start: 2022-11-13 — End: 2022-11-13
  Administered 2022-11-13: 17:00:00 6 mg via SUBCUTANEOUS

## 2022-11-13 MED ORDER — sodium chloride 0.9% flush 10 mL
Freq: Every day | INTRAMUSCULAR | PRN
Start: 2022-11-13 — End: 2022-11-13
  Administered 2022-11-13: 17:00:00 10 mL via INTRAVENOUS

## 2022-11-13 MED ORDER — gemcitabine (GEMZAR) 1,600 mg in sodium chloride 0.9 % 100 mL chemo infusion
200 | Freq: Once | INTRAVENOUS | Status: AC
Start: 2022-11-13 — End: 2022-11-13
  Administered 2022-11-13: 16:00:00 1600 mg/m2 via INTRAVENOUS

## 2022-11-13 MED ORDER — LORazepam (ATIVAN) tablet 1 mg
1 | Freq: Four times a day (QID) | ORAL | PRN
Start: 2022-11-13 — End: 2022-11-13

## 2022-11-13 MED ORDER — sodium chloride 0.9 % infusion
Freq: Every day | INTRAVENOUS | PRN
Start: 2022-11-13 — End: 2022-11-13
  Administered 2022-11-13: 15:00:00 25 mL/h via INTRAVENOUS

## 2022-11-13 MED ORDER — heparin lock flush 100 unit/mL syringe 500 Units
100 | Freq: Every day | INTRAVENOUS | PRN
Start: 2022-11-13 — End: 2022-11-13
  Administered 2022-11-13: 17:00:00 500 [IU]

## 2022-11-13 MED ORDER — sodium chloride 0.9% flush 10 mL
Freq: Every day | INTRAMUSCULAR | Status: AC | PRN
Start: 2022-11-13 — End: 2022-11-14
  Administered 2022-11-13: 14:00:00 10 mL via INTRAVENOUS

## 2022-11-13 MED ORDER — magnesium sulfate in D5W 100 mL 1 gram/100 mL IVPB 1 g
1 | INTRAVENOUS | Status: AC
Start: 2022-11-13 — End: 2022-11-13
  Administered 2022-11-13 (×2): 1 g via INTRAVENOUS

## 2022-11-13 MED FILL — NEULASTA ONPRO 6 MG/0.6 ML WITH WEARABLE SUBCUTANEOUS INJECTOR: 6 6 mg/0.6 mL | SUBCUTANEOUS | Qty: 0.6

## 2022-11-13 MED FILL — GEMCITABINE 200 MG INTRAVENOUS SOLUTION: 200 200 mg | INTRAVENOUS | Qty: 42.08

## 2022-11-13 MED FILL — MAGNESIUM SULFATE 1 GRAM/100 ML IN DEXTROSE 5 % INTRAVENOUS PIGGYBACK: 1 1 gram/100 mL | INTRAVENOUS | Qty: 100

## 2022-11-13 MED FILL — DEXAMETHASONE 4 MG TABLET: 4 4 MG | ORAL | Qty: 3

## 2022-11-13 NOTE — Unmapped (Signed)
Carroll Kinds Iribe here today for C3 D8 of Gemzar. Informed consent verified. Labs verified to be in normal range for ordered treatment. Pt arrived with port having been accessed in port room; positive blood return. Pt not a fall risk on assessment. See flow sheets for vital signs.  Pt tolerated treatment without difficulty. Port flushed with NS 10ml and 5 ml Heparin 100 units/ml. Positive blood return noted. Discharged ambulatory. Pt given AVS.

## 2022-11-13 NOTE — Unmapped (Signed)
0850 Port accessed per protocol. Blood return noted. Flushed with 10ml of normal saline, occlusive dressing placed over the huber needle. See flow sheet for details.   Allergies reviewed with patient.   Labs drawn via port as ordered.  The skin on the port site is intact and has natural color.

## 2022-11-17 ENCOUNTER — Inpatient Hospital Stay: Admit: 2022-11-17 | Discharge: 2022-11-17 | Payer: Medicare (Managed Care)

## 2022-11-17 DIAGNOSIS — C569 Malignant neoplasm of unspecified ovary: Secondary | ICD-10-CM

## 2022-11-17 MED ORDER — OMNIPAQUE (iohexol) 350 mg iodine/mL 100 mL
350 | Freq: Once | INTRAVENOUS | Status: AC | PRN
Start: 2022-11-17 — End: 2022-11-17
  Administered 2022-11-17: 16:00:00 100 mL via INTRAVENOUS

## 2022-11-17 MED ORDER — OMNIPAQUE (iohexol) 240 mg iodine/mL 50 mL
240 | Freq: Once | INTRAVENOUS | Status: AC | PRN
Start: 2022-11-17 — End: 2022-11-17
  Administered 2022-11-17: 16:00:00 50 mL via ORAL

## 2022-11-17 MED FILL — OMNIPAQUE 350 MG IODINE/ML INTRAVENOUS SOLUTION: 350 350 mg iodine/mL | INTRAVENOUS | Qty: 100

## 2022-11-17 MED FILL — OMNIPAQUE 240 MG IODINE/ML INTRAVENOUS SOLUTION: 240 240 mg iodine/mL | INTRAVENOUS | Qty: 50

## 2022-11-17 NOTE — Unmapped (Signed)
Patient called to see if she actually needs the infusion appointment on Thursday. Requesting call back

## 2022-11-17 NOTE — Unmapped (Signed)
RTC to patient LVM letting patient know tx is due on 12/21, infusion can be moved out.

## 2022-11-18 NOTE — Unmapped (Signed)
Patient calling in with complaints of a nose bleed. Patient had nose bleed previously but had been taking herbal supplements, nose bleeds stopped once she stopped the supplements. Patient had chinese food last night that she feels may have had some spices that aggravated her nose, causing the bleed. She would like to monitor and will call back if nose bleed becomes significant. Explained sometimes it is necessary to go to ED for nasal packing. Patient does not feel as though this is necessary at this time but will call back for potential labs and/or evaluation.     Message sent to infusion to change patient's infusion appointment to 12/21 as she is seeing Dr. B this week for early prechemo.    Future Appointments   Date Time Provider Department Center   11/20/2022  9:45 AM Mikle Boswortharoline Craig Billingsley, MD [facility]   11/20/2022 10:45 AM [facility]AIR 07 [facility] 3 [facility]

## 2022-11-20 ENCOUNTER — Ambulatory Visit

## 2022-11-20 ENCOUNTER — Ambulatory Visit: Admit: 2022-11-20 | Discharge: 2022-11-20 | Payer: Medicare (Managed Care)

## 2022-11-20 DIAGNOSIS — Z5111 Encounter for antineoplastic chemotherapy: Secondary | ICD-10-CM

## 2022-11-20 DIAGNOSIS — C569 Malignant neoplasm of unspecified ovary: Secondary | ICD-10-CM

## 2022-11-20 NOTE — Unmapped (Signed)
History of Present Illness  Kelsey Sullivan is a 81 y.o. 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 She declined adjuvant therapy. In 01/2018, she experienced mid abdominal pain, and seen by PCP and ED, with CT of abdomen and pelvis showing interval development of necrotic retroperitoneal lymphadenopathy with largest node measuring 4.9  4.2 x 4.8 cm. Her CA 125 on 02/18/18 was 197.8.  She underwent a ct-guided nodal biopsy on 02/26/18, with pathology confirming recurrent disease.  She completed 6 cycles of chemotherapy with Carboplatin and Paclitaxel-weekly (C6D1 was 08/27/18). CA 125 was 8.7 at C6.     She underwent CT CAP on 09/22/2018 revealed ned in chest and interval decrease in size of a left periaortic necrotic lymph node from 1.8 to 1.6 cm. 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 PET on 10/15/18 shows reassuring intraabdominal findings with resolution of the previous hypermetabolic para-aortic lymph nodes and hepatic dome lesions, but did note uptake within the thyroid. She underwent thyroid US on 10/20/18 with a 1.2 cm irregular hypoechoic nodule in the left thyroid lobe is suspicious. Recommended FNA for further evaluation, but she did not get this done.    She initiated maintenance rubraca 11/2018 but ultimately elected to stop in 02/2019 due to side effects.     She was seen for surveillance 08/21/2022 with an increase in her CA  125 to 8.5 - CT CAP and PET were checked and consistent with platinum sensitive recurrence #2.   CT CAP on 08/25/2022 noted 2.6 cm lesion along the dome of the right hepatic lobe may represent a peritoneal lesion along the undersurface of the diaphragm rather than a true hepatic parenchymal lesion. 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   PET 08/29/2022 noted two FDG avid foci at the superior surface of right hepatic lobe, favored to be peritoneal nodules along the undersurface of right hemidiaphragm, concerning for metastases.     She proceeded on Carbo/gem/bev, C1 09/16/22. C2 was delayed to 10/16/2022 due to neutropenia, neulasta added for C2.    Patient was admitted from 11/12-11/13/23 to the hospital for extensive LUE/shoulder/neck DVT and was started on a heparin gtt and discharged home on eliquis. Per vascular surgery, there were no acute interventions needed besides anticoagulation. She was on eliquis 10 mg BID for 7 days (11/13-11/19) followed by eliquis 5mg  BID for 6 months. Per surgical oncology, she only needs repeat imaging if the swelling gets worse while she is on [facility] or if the port becomes non-functional.     She underwent CTs after C3 on 11/17/22 which showed decreased disease.     There are no changes to the cancer history HPI above.         11/17/2022:   CTAP: Interval decrease in size of the peritoneal nodule in the dome of  the right hepatic lobe. No evidence of new metastatic disease. he lesion along the dome of the right hepatic lobe is not demonstrable on axial images. On sagittal images, it appears much smaller, having the appearance of a peritoneal nodule under the diaphragmatic surface, measuring 1.6 x 0.6 cm (series 602, image 41), previously 1.9 x 1.5 cm.   CTC: NED      She is seen today for PCE.  Kelsey Sullivan is doing okay.   She reports nausea and diarrhea for the past 3 days. She reports  eating [place] food on the way home from her CT scans and started to feel poorly and have nose bleeding x24 hrs. She then gave herself an enema for the past 3 days which resulted in diarrhea. She had diarrhea again this morning but did not give herself enema today. She reports constipation after all of her chemo cycles.   She reports nausea is currently improved.   She denies any abdominal/pelvic pain.   She denies any bloating or fullness.  She denies any GU concerns. Denies any dysuria, frequency, hematuria, flank pain or fevers.  She denies any vaginal bleeding or discharge.   She denies neuropathy.   She reports no leg swelling.   Her appetite is good, and reports no nausea or vomiting.   She had some mild bone pain in hips following chemo, controlled with tylenol. Resolved now.        There are no changes to the cancer history HPI above.         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, ovary, omentum, cul de sac peritoneum 12/2012, plat sensitive recurrence 03/2018    Disposition: RTC 3weeks C3 (D1,D8, D9)  2wks C2D8 gem only; Add neulasta  Port referral to surg onc  Current disease status: Neg for CS mutation, no VUS, lifetime remaining breast cancer risk -3.4%  Genetics: negative for mutations, breast cancer lifetime risk 3.4%  Survivorship plan: pending completion of treatment        Malignant neoplasm of ovary (CMS-HCC)   12/28/2012 Pathology    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    Omentum-biopsy for frozen section-metastatic adenocarcionma.  Residual right IP ligament-benign smooth muscle and fibroadiopse tissue containing thick walled blood vessels.    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  Anterior cul de sac biopsy-benign fibroadipose tissue.   Posterior cul de sac  biopsy-metastatic adenocarcinoma.  Right pelvic side will biopsy- benign fibroadipose tissue.  Left pelvic side wall, right gutter and Left gutter-benign fibroadipose tissue.  Omentectomy- metastatic adenocarcinoma  LND-negative     12/28/2012 Surgery    BSO, omentectomy, LND     12/28/2012 Initial Diagnosis    Malignant neoplasm of both ovaries (CMS Dx)     01/08/2013 - 01/08/2013 Systemic Therapy (Oral and IV)    Patient refused chemotherapy and did alternative treatment including a raw diet for 1 year.       04/06/2017 Imaging    CT [name]- mild small bowel obstruction with transition point in the left pelvis adjacent to the anterior abdominal wall. Low density in the common femoral veins bilaterally thought likely to represent flow artificat, may consider follow up with lomer extremity Doppler to ensure there is no underlying deep venous thrombosis.  01/29/2018 Imaging    CT [name]-  Interval development of necrotic retroperitoneal lymphadenopathy with largest node measuring 4.9  4.2 x 4.8 cm. Origin is indeterminate.  Options would include CT directed biopsy as well as PET/CT.     02/18/2018 Tumor Markers    CA 125:  197.8     02/26/2018 Biopsy    CT guided Left retroperitoneal node biopsy- focally involved by non-small cell malignancy- additional stains are diagnostic of non-small cell carcionoma and supportive of involve15ment by the patients reported previous known ovarian malignancy.     03/04/2018 Imaging    PET/CT-  FDG avid retroperitoneal lymphadenopathy as described above and an FDG avid lesion over the dome of the liver most likely a peritoneal implant rather than a liver metastasis.  No other focal areas of FD avid malignancy are found. Specifically, no lymph node activity is found outside of the retroperitoneum and there is no evidence for FDG avid pulmonary, skeletal or hepatic parenchymal metastasis.     04/01/2018 Cancer Staged     Cancer Staging   Malignant neoplasm of ovary (CMS-HCC)  Staging form: Ovary,  Fallopian Tube, And Primary Peritoneal Carcinoma, AJCC 8th Edition  - Clinical: FIGO Stage IIIC - Signed by Mikle Bosworth, MD on 04/01/2018         04/12/2018 Procedure    IR port placed       04/15/2018 Genetic Testing    Myraid Testing-Neg     04/16/2018 - 08/27/2018 Systemic Therapy (Oral and IV)    Treatment Summary   Treatment goal Disease control   Plan Name OP Gyn CARBOplatin AUC 6 / PACLitaxel Weekly   Status Active   Start Date 04/16/2018   End Date 09/10/2018 (Planned)   Provider Mikle Bosworth, MD   Chemotherapy PACLitaxel (TAXOL) 120 mg in sodium chloride 0.9 % 250 mL chemo infusion, 80 mg/m2 = 120 mg, Intravenous, Once, 4 of 6 cycles    CARBOplatin (PARAPLATIN) 480 mg in dextrose 5% in water (D5W) 250 mL chemo infusion, 480 mg, Intravenous, Once, 4 of 6 cycles  Dose modification: 497 mg (original dose 484.8 mg, Cycle 2, Reason: Other (See Comments))         04/16/2018:   C1 weekly taxol 80 mg/m2 D1,8,15, Carboplatin AUC 6 D1 q 21 days. PCEs on thurs, txt on fridays. EXAMS ON EVEN CYCLES     CA 125: 523.4     Genetic testing: Neg for CS mutation, no VUS, lifetime remaining breast cancer risk -3.4%    05/07/2018:   C2 weekly taxol 80 mg/m2 D1,8,15, Carboplatin AUC 6 D1 q 21 days. PCEs on thurs, txt on fridays. EXAMS ON EVEN CYCLES     CA 125: 162.2    05/20/2018:   Early PCE due to scheduling, due 6/21    05/28/2018  Delay C3 due to low ANC     CA 125: 25    06/04/2018  C3 weekly taxol 80 mg/m2 D1,8,15, Carboplatin AUC 6 D1 q 21 days. EXAMS ON EVEN CYCLES.      CA 125    06/25/2018:   HOLD chemo due to low anc (1047)- defer to next week, and change to q 28 days cycle to build in an off week to allow for counts to recover     CA 125: 10.6    07/02/2018:   C4 weekly taxol 80 mg/m2 D1,8,15, Carboplatin AUC 6 D1 q 28 days. EXAMS ON EVEN CYCLES.  Review her genetics testing results  CA 125 7.2    07/30/2018:  C5 weekly taxol 80 mg/m2 D1,8,15, Carboplatin AUC 6 D1 q 28 days. Neulasta Day 16       EXAMS ON EVEN CYCLES.      CA 125: 11.4    08/27/2018:   C6 weekly taxol 80 mg/m2 D1,8,15, Carboplatin AUC 6 D1 q 28 days. Neulasta Day 16. Order Ct CAP. RTC 10/11 (3 weeks)     EXAMS ON EVEN CYCLES. Discussed option of maintenance parp I- pt not decided (doesn't want to feel tired anymore)     CA 125: 8.7       05/28/2018 Tumor Markers    Tumor markers tested:  CA 125.  Results:   25.     06/25/2018 Tumor Markers    Tumor markers tested:  CA 125.  Results:   10.6.     07/02/2018 Tumor Markers    Tumor markers tested:  CA 125.  Results:   7.2.     07/07/2018 Hospital Admission    Admit date: 7/31 - 07/13/18  Admission diagnosis: Partial SBO  Additional comments: On 7/31 she experienced abdominal pain at home that was sharp on her LLQ and had significant diarrhea and one episode of emesis. She came [facility] be evaluated [facility]. She has a history of prior small bowel obstruction. She was give IL NS, 2mg  morphine IV, and 4mg  Zofran IV in clinic and admitted for further observation. The abdominal XR showed dilated bowel loops, concerning for obstruction. CT abdomen/pelvis showed dilation of the small bowel with transition point seen in the anterior mid abdomen near the midline.     07/07/2018 Imaging    Imaging Completed:  X-ray of  abdomen  Result: Nonspecific bowel gas pattern. The presence of multiple air-fluid levels in small bowel raises question of early or partial small bowel obstruction.     07/07/2018 Imaging    CTAP:Small bowel obstruction with transition point in the anterior mid abdomen near the midline. Small amount of free fluid in the abdomen, which is nonspecific. 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  Peritoneum: Small amount of free fluid in the abdomen. A para-aortic density with central low attenuation measures 2.5 x 2 cm and is significantly decreased in size from prior.       07/30/2018 Tumor Markers    Tumor markers  tested:  CA 125.  Results:   11.4.     08/27/2018 Tumor Markers    Tumor markers tested:  CA 125.  Results:   8.7.     09/20/2018 Procedure    Port removed: While the skin surface appeared inflamed, there was no evidence of infection in the port reservoir.       09/22/2018 Imaging    CT Chest-No evidence of thoracic metastatic disease.  Suspected small seroma in the base of the right neck.  Necrotic metastatic lesion could also have this appearance but considered less likely.    [name]- Interval decrease in size of a left periaortic necrotic lymph node. No evidence of new metastatic disease.     10/15/2018 Imaging    Imaging Completed:  PET scan of  whole body  Result: Resolution of the previous hypermetabolic para-aortic lymph nodes and hepatic dome lesions.  Hypermetabolic focus within the left thyroid lobe.  Consider further evaluation with ultrasound, if clinically indicated.       10/19/2018 Imaging    Imaging Completed:  Ultrasound  of  neck  Result: 1.2 cm irregular hypoechoic nodule in the left thyroid lobe is suspicious. Recommend FNA for further evaluation.  1.0 cm nodule right thyroid lobe containing macrocalcification is indeterminate. Recommend follow-up thyroid ultrasound in one year to evaluate for stability.     10/21/2018 Tumor Markers    Tumor markers tested:  CA 125.  Results:   4.8.     11/12/2018 Tumor Markers    Tumor markers tested:  CA 125  Results:   5.5     11/12/2018 - 12/03/2018 Systemic Therapy (Oral and IV)    11/12/2018: C1 rubraca 600 mg po BID. RTC 2 weeks for labs (CBC, CMP, 4 weeks for PCE #2). Pt plans to go to [place] and may leave before PCE and re-est care at [facility] (Dr. Gerilyn Pilgrim). Referral to ENT placed (pt cancelled appt for 11/22) and FNA was prev ordered for thyroid nodule- but has not been done.        03/02/2019 Imaging    CT Scan from [facility]    No evidence for recurrent or new neoplasm. Increased colonic stool could indicate constipation. Postoperative changes      06/13/2019 Tumor Markers    Tumor markers tested: CA 125 Results:   5.0.     09/15/2019 Tumor Markers    CA 125-6.1     05/31/2020 Tumor Markers    Ca 125-5.9     08/16/2020 Tumor Markers    CA 125-6.2     02/06/2021 Tumor Markers    CA 125-4.6     08/25/2022 Imaging    CT a/p: 2.6 cm lesion along the dome of the right hepatic lobe may represent a peritoneal lesion along the undersurface of the diaphragm rather than a true hepatic parenchymal lesion. 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     CT chest: No evidence of intrathoracic metastatic disease.      08/29/2022 Imaging    PET: 1.  Two FDG avid foci at the superior surface of right hepatic lobe, favored to be peritoneal nodules along the undersurface of right hemidiaphragm, concerning for metastases.      09/15/2022 Procedure    09/15/2022:   scheduled for Power Port Insertion with Dr. Andrey Campanile      09/16/2022 -  Systemic Therapy (Oral and IV)    09/16/2022:   C1 carboplatin AUC4, gem 1000 mg/m2 (D1/8), Bev D1 q 21 days  Treatment Summary   Treatment goal Disease control   Plan Name OP Gyn CARBOplatin AUC 4 / Gemcitabine / Bevacizumab   Status Active   Start Date 09/16/2022   End Date 12/04/2022 (Planned)   Provider Mikle Bosworth, MD   Chemotherapy bevacizumab (AVASTIN) 850 mg in sodium chloride 0.9 % 100 mL chemo infusion, 15 mg/kg = 850 mg, Intravenous, Once, 3 of 4 cycles  Administration: 850 mg (09/16/2022), 850 mg (10/16/2022), 850 mg (11/06/2022)  gemcitabine (GEMZAR) 1,600 mg in sodium chloride 0.9 % 100 mL chemo infusion, 1,000 mg/m2 = 1,600 mg, Intravenous, Once, 3 of 4 cycles  Administration: 1,600 mg (09/16/2022), 1,600 mg (09/23/2022), 1,600 mg (10/16/2022), 1,600 mg (10/23/2022), 1,600 mg (11/06/2022), 1,600 mg (11/13/2022)            09/16/2022 Tumor Markers    Tumor markers tested:  CA 125  Results:   7.5     10/07/2022 Tumor Markers    CA125: 7.9      10/16/2022 Tumor Markers  CA 125: 7.9     11/06/2022 Tumor Markers    CA 125: 11.1     11/17/2022 Imaging    Imaging Completed:  CT Scan of  chest, abdomen, and pelvis  AP: Interval decrease in size of the peritoneal nodule in the dome of the right hepatic lobe. No evidence of new metastatic disease.   CHEST: No evidence of intrathoracic metastatic disease.            Past Medical History  She  has a past medical history of Bowel obstruction (CMS-HCC) (2018), Ovarian cancer (CMS-HCC) (2014), and Thyroid disease.  Past Medical History:   Diagnosis Date    Bowel obstruction (CMS-HCC) 2018    Ovarian cancer (CMS-HCC) 2014    Thyroid disease        Past Surgical History  Past Surgical History:   Procedure Laterality Date    ABDOMINOPLASTY  1999    BLEPHAROPLASTY  2000    DILATION AND CURETTAGE OF UTERUS  1979    ELBOW SURGERY  1990    EYE SURGERY  1994    INSERTION CATHETER VASCULAR ACCESS N/A 09/15/2022    Procedure: INSERTION POWER PORT A CATH;  Surgeon: Garrel Ridgel, MD;  Location: [facility] OR;  Service: General;  Laterality: N/A;  Left Subclavian    KNEE ARTHROPLASTY Right     ROTATOR CUFF REPAIR      TRANSUMBILICAL AUGMENTATION MAMMAPLASTY      VAGINAL HYSTERECTOMY  1979       Family History  History reviewed. No pertinent family history.    Family cancer history for ovarian, uterine and colon cancer is negative other than above.       Social History  Social History     Socioeconomic History    Marital status: Married     Spouse name: None    Number of children: None    Years of education: None    Highest education level: None   Occupational History    None   Tobacco Use    Smoking status: Some Days     Packs/day: 0     Types: Cigarettes    Smokeless tobacco: Never    Tobacco comments:     Smokes 1 pack every three days    Vaping Use    Vaping Use: Never used   Substance and Sexual Activity    Alcohol use: Yes     Alcohol/week: 14.0 standard drinks of alcohol     Types: 14 Shots of liquor per week    Drug use: No     Sexual activity: Not Currently   Other Topics Concern    Caffeine Use Yes    Occupational Exposure No    Exercise Yes    Seat Belt Yes   Social History Narrative    Mammogram-6 years ago and it was normal. Pt stated she isn't getting them anymore.      Colonoscopy-10 years ago        Pt reports she can lie flat in bed without SOB.    Pt states she can walk a flight of stairs and city block without chest pain and SOB>       Social Determinants of Health     Financial Resource Strain: Not on file   Food Insecurity: No Food Insecurity (10/19/2022)    Hunger Vital Sign     Worried About Running Out of Food in the Last Year: Never true     Ran Out of Food in  the Last Year: Never true   Transportation Needs: No Transportation Needs (10/19/2022)    PRAPARE - Therapist, art (Medical): No     Lack of Transportation (Non-Medical): No   Physical Activity: Not on file   Stress: Not on file   Social Connections: Not on file   Intimate Partner Violence: Not At Risk (10/19/2022)    Humiliation, Afraid, Rape, and Kick questionnaire     Fear of Current or Ex-Partner: No     Emotionally Abused: No     Physically Abused: No     Sexually Abused: No   Housing Stability: Low Risk  (10/19/2022)    Housing Stability Vital Sign     Unable to Pay for Housing in the Last Year: No     Number of Places Lived in the Last Year: 1     Unstable Housing in the Last Year: No       Past OB/GYN History  G3P2  She reports menarche at age 66 and menopause at age 73-surgical.  She denies a history of STIs.  She denies a history of abnormal cervical cytology and reports her last cytologic examination she is unsure of. She has taken HRT.     Health maintenance:  Mammogram: Date 6 years ago Results normal  Colonoscopy: Date 10 years ago Results normal    Allergies  She is allergic to nsaids (non-steroidal anti-inflammatory drug), sulfamethoxazole-trimethoprim, celecoxib, and metoclopramide.    BMI  Body mass index is 21.28  kg/m.    Disease Status: No evidence of disease/remission (08/21/2022 12:27 PM)              Histories  She has a past medical history of Bowel obstruction (CMS-HCC) (2018), Ovarian cancer (CMS-HCC) (2014), and Thyroid disease.    She has a past surgical history that includes Dilation and curettage of uterus (1979); Vaginal hysterectomy (1979); Elbow surgery (1990); Eye surgery (1994); Abdominoplasty (1999); Blepharoplasty (2000); Transumbilical augmentation mammaplasty; Rotator cuff repair; Knee Arthroplasty (Right); and insertion catheter vascular access (N/A, 09/15/2022).    Her family history is not on file.    She reports that she has been smoking cigarettes. She has never used smokeless tobacco. She reports current alcohol use of about 14.0 standard drinks of alcohol per week. She reports that she does not use drugs.    Allergies  Nsaids (non-steroidal anti-inflammatory drug), Sulfamethoxazole-trimethoprim, Celecoxib, and Metoclopramide    Medications  Outpatient Encounter Medications as of 11/20/2022   Medication Sig Dispense Refill    apixaban (ELIQUIS) 5 mg (74 tabs) DsPk Take 2 tabetls (10 mg) by mouth twice a day for 7 days; followed by 1 tablet (5 mg) twice a day. 74 tablet 0    apixaban (ELIQUIS) 5 mg Tab Take 1 tablet (5 mg total) by mouth 2 times a day. 180 tablet 1    ascorbic acid, vitamin C, (VITAMIN C) 1000 MG tablet Take 1 tablet (1,000 mg total) by mouth daily.      cetirizine 10 mg Cap Take 10 mg by mouth daily.      cholecalciferol, vitamin D3, (VITAMIN D3) 1000 units tablet Take 1 tablet (1,000 Units total) by mouth daily. 30 tablet 0    dexAMETHasone (DECADRON) 4 MG tablet Take 8 mg by mouth daily with food on day 2, 3, and 4 of chemotherapy cycle 18 tablet 4    GLUTATHIONE-L ORAL Take 1 teaspoonful by mouth daily.      IODINE ORAL Take 50  mg/day by mouth.      Lactobac no.41/Bifidobact no.7 (PROBIOTIC-10 ORAL) Take 1 capsule by mouth daily. [employer] brand      levothyroxine (SYNTHROID)  75 MCG tablet Take 1 tablet (75 mcg total) by mouth every morning before breakfast.      lisinopriL (PRINIVIL) 10 MG tablet Take 1 tablet (10 mg total) by mouth daily. 30 tablet 3    omeprazole (PRILOSEC OTC) 20 MG tablet Take 1 tablet (20 mg total) by mouth every morning before breakfast. 20mg  twice a day      UNABLE TO FIND Take 30 drops by mouth daily. Multimineral      UNABLE TO FIND Take 20 drops by mouth daily. Med Name: Lurena Joiner Drops      UNABLE TO FIND Take 2 capsules by mouth daily. Med Name: Body Bio      vit b complex w-b 12 tablet Take 1 tablet by mouth daily.      ZINC CITRATE ORAL Take 1 tablet by mouth daily.      [DISCONTINUED] ubidecarenone (COENZYME Q10 ORAL) Take 1 tablet by mouth daily.       No facility-administered encounter medications on file as of 11/20/2022.        Review of Systemssee HPI    Vitals  Blood pressure 142/73, pulse 98, temperature 97.5 F (36.4 C), temperature source Temporal, resp. rate 16, height 5' 4 (1.626 m), weight 124 lb (56.2 kg), SpO2 100%.    Physical Exam   Vitals reviewed.  Constitutional: She is oriented to person, place, and time. She appears well-developed.   HENT:   Head: Atraumatic.   Nose: Nose normal.   Neck: No thyromegaly present.   Cardiovascular: Normal rate.   Pulmonary/Chest: Effort normal.   Abdominal: Soft. Normal appearance. She exhibits no distension and no mass. There is no abdominal tenderness. There is no rebound and no guarding.   Genitourinary:    Genitourinary Comments: def     Musculoskeletal:         General: Normal range of motion.      Cervical back: Neck supple.   Neurological: She is alert and oriented to person, place, and time.   Skin: Skin is warm and dry.   Psychiatric: Her behavior is normal.          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     Posterior cul de sac biopsy-metastatic adenocarcinoma.     Omentectomy- metastatic adenocarcinoma     LND-negative     Residual right  IP ligament, Anterior cul de sac biopsy, right pelvic side will biopsy, left pelvic side wall, right gutter and left gutter-->benign    04/06/2017:   CT [name]- mild small bowel obstruction with transition point in the left pelvis adjacent to the anterior abdominal wall. Low density in the common femoral veins bilaterally thought likely to represent flow artificat, may consider follow up with lomer extremity Doppler to ensure there is no underlying deep venous thrombosis.    01/29/2018:   CTAP:  Interval development of necrotic retroperitoneal lymphadenopathy with largest node measuring 4.9  4.2 x 4.8 cm. Origin is indeterminate.  Options would include CT directed biopsy as well as PET/CT.    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  No other focal areas of FD avid malignancy  are found. Specifically, no lymph node activity is found outside of the retroperitoneum and there is no evidence for FDG avid pulmonary, skeletal or hepatic parenchymal metastasis.    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 No retroperitoneal, intra-abdominal, pelvic or inguinal adenopathy, no evidence for peritoneal surface neoplastic involvement. No evidence for recurrent or new neoplasm    Cancer Staging:   Cancer Staging   Malignant neoplasm of ovary (CMS-HCC)  Staging form: Ovary, Fallopian Tube, And Primary Peritoneal Carcinoma, AJCC 8th Edition  - Clinical: FIGO Stage IIIC - Signed by Mikle Bosworth, MD on 04/01/2018      Disease Status: No evidence of disease/remission (08/21/2022 12:27 PM)               Assessment & Plan  My impression is Ms. 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 She completed 6 cycles of chemotherapy with Carboplatin and Paclitaxel-weekly (C6D1 was 08/27/18). CA 125 was 8.7 at C6.     She underwent CT CAP on 09/22/2018 revealed ned in chest and interval decrease in size of a left periaortic necrotic lymph node from 1.8 to 1.6 cm. 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     PET on 10/15/18 shows reassuring intraabdominal findings with resolution of the previous hypermetabolic para-aortic lymph nodes and hepatic dome lesions, but did note uptake within the thyroid. She underwent thyroid US on 10/20/18 with a 1.2 cm irregular hypoechoic nodule in the left thyroid lobe is suspicious. Recommend FNA for further evaluation.      She initiated maintenance rubraca 11/2018 but ultimately elected to stop in 02/2019 due to side effects.   Patient had reestarted drug at 500 mg po BID once she got down to [place]-. Was in [place] from 12/2018-05/2019. Reports her oncologist down in [place] had changed doses of Rubraca a few times, to 300mg  po BID but decided to stop  drug all together back in 02/2019 2/2 to decreased QOL. She does not wish to restart drug at this time. Last CT scan 03/02/2019 in [place], no evidence of recurrent or new neoplasm.     She was seen for surveillance 08/21/2022 with an increase in her CA 125 to 8.5 - CT CAP and PET were checked and consistent with platinum sensitive recurrence #2.     At this time, she has decided to proceed with chemotherapy.    She underwent repeat CTs following C3 on 11/17/22 which showed partial response with interval decrease in size of the peritoneal nodule in the dome of the right hepatic lobe NED in the chest.     She is seen today for early PCE Prior to C4 of Carboplatin/Gemcitabine/bevacizumab. Neulasta added to C2D9, (planned 11/27/22).      She is tolerating chemotherapy appropriately well, and has experienced expected but manageable side effects. I have reviewed her labs and she is cleared chemotherapy. There is no evidence of progressive disease or excessive toxicity today, and we will continue treatment without dose modification. We will see her back for her next cycle of treatment, and she knows to call if questions or problems arise.     11/20/2022:   Early PCE, review CTs- PR after C3, C4 12/21, RTC 1/11 C5 CB- doing ok- had some nose bleeds last week, better now. Recommend nasal saline- she is on DOAC, bev- so higher risk for bleeding. Had diarrhea after chinese, getting better     11/27/2022:   Plan C4 carboplatin AUC4, gem 1000 mg/m2 (D1/8), Bev D1 q 21 days. 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 Neulasta D9 (started C2), LUE/neck DVTs 10/2022- on eliquis.   CA 125    __ RTC  in 4 wks for C5  __Neulasta added to D9 C2  __Lisinopril added for HTN 10/07/2022  __new LUE/neck DVT 12/2021- on elqiuis- see below    VTE:  -Patient was admitted from 11/12-11/13/23 to the hospital for extensive LUE/shoulder/neck DVT and was started on a heparin gtt and discharged home on eliquis. Per vascular surgery, there were no acute interventions needed besides anticoagulation. She was on eliquis 10 mg BID for 7 days (11/13-11/19) followed by eliquis 5mg  BID for 6 months. Per surgical oncology, she only needs repeat imaging if the swelling gets worse while she is on [facility] or if the port becomes non-functional. Clot burden on exam is improved  - had two weeks of multiple nose bleeds on the eliquis but stopped taking the herbs that might have been interacting with her eliquis and that stopped her nose bleeds   -She is going to continue on her bev    Thyroid Nodule:   Incidental finding on PET scan of hypermetabolic focus within left thyroid nodule. Ultrasound performed on 10/20/18 with 1.2 cm irregular hypoechoic nodule, appearing suspicious. Recommend FNA for further evaluation, which was ordered. Pt has not followed up to date.  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 Referral to ENT made, but patient did not see them. She followed up with her PCP and was started on Synthroid. Last TSH 5.91. Repeat TSH/T4/T3 labs wnl 11/2020       Genetics:  previously discussed her negative genetic testing results.    Pain    Pain Score: Zero (11/20/2022  9:35 AM)    She denies presence of pain.  Pain will be reassessed at next visit.  The HPI, ROS, and test results were reviewed and copied forward (with edits) from a note written by Montey Hora, MD  on 11/06/22. I have reviewed and updated the history, physical exam, data, assessment, and plan of the note so that it reflects my evaluation and management of the patient.    Salley Hews, CNP  Division of Gynecologic  Oncology  [phone]     I saw and personally examined the patient today. I discussed the findings and therapeutic plan with the patient. I repeated, reviewed and agree with the history of present illness, past medical histories, family history, social history, medication list, and allergies as listed. The review of systems is as noted above. My physical exam confirms the findings listed above. Review of labs, pathology reports, radiograph reports, and medical records confirm the findings noted above. I agree with the assessment and plan as noted above. I have edited the note where appropriate.    This note was completely edited, written and reviewed by me and consists of information cut and pasted from the my most recent visit, my smart phrases and other Epic tools. I have personally reviewed all aspects of this note to at least include reviewing this patient's chart and problem list, updating the history, physical exam, lab and procedure results, and assessment and plan as detailed above and below. As such this visit note reflects my current evaluation and management for this patient.    This note was copied forward from the note written by me on 11/06/2022. I have reviewed and updated the history, physical exam, data, assessment and plan of the note so that it reflects the evaluation and management of the patient on 11/20/2022    I have personally performed a face to face diagnostic evaluation on this patient, seen initially by the CNP. I have personally developed the care plan as outlined in the Assessment and Plan.      Complexity: high     I spent a total of 45 minutes was spent prior to, during and after the patient encounter in counseling and/or coordination of care, documentation and counseling with patient and/or family.       Topics discussed include ovarian  Cancer prognosis and treatment options and management of chemo.    11/17/2022:   CTAP: Interval decrease in size of the peritoneal nodule in the  dome of the right hepatic lobe. No evidence of new metastatic disease. he lesion along the dome of the right hepatic lobe is not demonstrable on axial images. On sagittal images, it appears much smaller, having the appearance of a peritoneal nodule under the diaphragmatic surface, measuring 1.6 x 0.6 cm (series 602, image 41), previously 1.9 x 1.5 cm.   CTC: NED    11/20/2022:   Early PCE, review CTs- PR after C3, C4 12/21, RTC 1/11 C5 CB- doing ok- had some nose bleeds last week, better now. Recommend nasal saline- she is on DOAC, bev- so higher risk for bleeding. Had diarrhea after chinese, getting better     11/27/2022:   Plan C4 carboplatin AUC4, gem 1000 mg/m2 (D1/8), Bev D1 q 21 days. 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 Neulasta D9 (started C2), LUE/neck DVTs 10/2022- on eliquis.   CA 125      Maggie Font, MD, FACOG  Associate Professor, Obstetrics and Gynecology  Division of Gynecologic Oncology        Medical Decision Making:  The following items were considered in medical decision making:  Review /  order clinical lab tests  Review / order radiology tests  Review / order other diagnostic tests/interventions

## 2022-11-20 NOTE — Unmapped (Signed)
Review of Systems   All other systems reviewed and are negative.

## 2022-11-20 NOTE — Unmapped (Signed)
Per Epic appt notes, 11/20/22 Infusion appt cancelled by Provider.

## 2022-11-21 ENCOUNTER — Institutional Professional Consult (permissible substitution): Payer: Medicare (Managed Care)

## 2022-11-21 ENCOUNTER — Inpatient Hospital Stay: Admit: 2022-11-21 | Payer: Medicare (Managed Care)

## 2022-11-21 ENCOUNTER — Ambulatory Visit: Admit: 2022-11-21 | Payer: Medicare (Managed Care)

## 2022-11-21 ENCOUNTER — Inpatient Hospital Stay
Admit: 2022-11-21 | Discharge: 2022-11-24 | Disposition: A | Discharge status: Home / Self Care | Payer: Medicare (Managed Care) | Source: Ambulatory Visit | Attending: Gynecologic Oncology | Admitting: Gynecologic Oncology

## 2022-11-21 DIAGNOSIS — C569 Malignant neoplasm of unspecified ovary: Secondary | ICD-10-CM

## 2022-11-21 LAB — INFLUENZA A AND B, COVID, RSV COMBINATION ASSAY, NAA
Influenza A: NEGATIVE
Influenza B: NEGATIVE
RSV: NEGATIVE
SARS-CoV-2: NEGATIVE

## 2022-11-21 LAB — DIFFERENTIAL
Basophils Absolute: 67 /uL (ref 0–200)
Basophils Relative: 0.4 % (ref 0.0–1.0)
Eosinophils Absolute: 50 /uL (ref 15–500)
Eosinophils Relative: 0.3 % (ref 0.0–8.0)
Lymphocytes Absolute: 1747 /uL (ref 850–3900)
Lymphocytes Relative: 10.4 % (ref 15.0–45.0)
Monocytes Absolute: 487 /uL (ref 200–950)
Monocytes Relative: 2.9 % (ref 0.0–12.0)
Neutrophils Absolute: 14448 /uL (ref 1500–7800)
Neutrophils Relative: 86 % (ref 40.0–80.0)
nRBC: 0 /100 WBC (ref 0–0)

## 2022-11-21 LAB — COMPREHENSIVE METABOLIC PANEL
ALT: 31 U/L (ref 7–52)
AST: 16 U/L (ref 13–39)
Albumin: 4.2 g/dL (ref 3.5–5.7)
Alkaline Phosphatase: 162 U/L (ref 36–125)
Anion Gap: 9 mmol/L (ref 3–16)
BUN: 13 mg/dL (ref 7–25)
CO2: 24 mmol/L (ref 21–33)
Calcium: 9.1 mg/dL (ref 8.6–10.3)
Chloride: 101 mmol/L (ref 98–110)
Creatinine: 0.73 mg/dL (ref 0.60–1.30)
EGFR: 83
Glucose: 136 mg/dL (ref 70–100)
Osmolality, Calculated: 280 mOsm/kg (ref 278–305)
Potassium: 3.8 mmol/L (ref 3.5–5.3)
Sodium: 134 mmol/L (ref 133–146)
Total Bilirubin: 0.2 mg/dL (ref 0.0–1.5)
Total Protein: 6.4 g/dL (ref 6.4–8.9)

## 2022-11-21 LAB — CBC
Hematocrit: 20.9 % (ref 35.0–45.0)
Hemoglobin: 7.1 g/dL (ref 11.7–15.5)
MCH: 34.3 pg (ref 27.0–33.0)
MCHC: 33.9 g/dL (ref 32.0–36.0)
MCV: 101.3 fL (ref 80.0–100.0)
MPV: 8.6 fL (ref 7.5–11.5)
Platelets: 29 10*3/uL (ref 140–400)
RBC: 2.06 10*6/uL (ref 3.80–5.10)
RDW: 15.4 % (ref 11.0–15.0)
WBC: 16.8 10*3/uL (ref 3.8–10.8)

## 2022-11-21 LAB — ANTIBODY SCREEN: Antibody Screen: NEGATIVE

## 2022-11-21 LAB — ABO/RH: Rh Type: POSITIVE

## 2022-11-21 LAB — MAGNESIUM: Magnesium: 1.5 mg/dL (ref 1.5–2.5)

## 2022-11-21 LAB — LACTIC ACID: Lactate: 0.8 mmol/L (ref 0.5–2.2)

## 2022-11-21 LAB — BLOOD CULTURE-PERIPHERAL: Culture Result: NO GROWTH

## 2022-11-21 LAB — PHOSPHORUS: Phosphorus: 4.5 mg/dL (ref 2.1–4.5)

## 2022-11-21 MED ORDER — sodium chloride 0.9% flush 10 mL
Freq: Every day | INTRAMUSCULAR | Status: AC | PRN
Start: 2022-11-21 — End: 2022-11-22
  Administered 2022-11-21: 19:00:00 10 mL via INTRAVENOUS

## 2022-11-21 MED ORDER — potassium chloride (KLOR-CON M20) CR tablet 20 mEq
20 | Freq: Once | ORAL | Status: AC
Start: 2022-11-21 — End: 2022-11-21
  Administered 2022-11-22: 04:00:00 20 meq via ORAL

## 2022-11-21 MED ORDER — magnesium sulfate in sterile water 100 mL IVPB 4 g
4 | Freq: Once | INTRAVENOUS | Status: AC
Start: 2022-11-21 — End: 2022-11-22
  Administered 2022-11-22: 04:00:00 4 g via INTRAVENOUS

## 2022-11-21 MED ORDER — lisinopriL (PRINIVIL) tablet 10 mg
5 | Freq: Every day | ORAL | Status: AC
Start: 2022-11-21 — End: 2022-11-24
  Administered 2022-11-22 – 2022-11-24 (×3): 10 mg via ORAL

## 2022-11-21 MED ORDER — levothyroxine (SYNTHROID) tablet 75 mcg
75 | Freq: Every morning | ORAL | Status: AC
Start: 2022-11-21 — End: 2022-11-24
  Administered 2022-11-22 – 2022-11-24 (×3): 75 ug via ORAL

## 2022-11-21 MED ORDER — heparin lock flush 100 unit/mL syringe 500 Units
100 | Freq: Every day | INTRAVENOUS | Status: AC | PRN
Start: 2022-11-21 — End: 2022-11-22
  Administered 2022-11-21: 19:00:00 500 [IU] via INTRAVENOUS

## 2022-11-21 MED ORDER — sodium chloride 0.9 % infusion
INTRAVENOUS | Status: AC
Start: 2022-11-21 — End: 2022-11-22

## 2022-11-21 MED ORDER — lactated Ringers IV infusion
INTRAVENOUS | Status: AC
Start: 2022-11-21 — End: 2022-11-23
  Administered 2022-11-21 – 2022-11-23 (×3): 125 mL/h via INTRAVENOUS

## 2022-11-21 NOTE — Unmapped (Signed)
Patient would like to discuss chest CT scan asked if she could be transferred to nurse advised patient they are in clinic and will send a message.

## 2022-11-21 NOTE — Unmapped (Signed)
20G UGPIV placed in the RFA.

## 2022-11-21 NOTE — Unmapped (Signed)
Patient called in upset no one has called her. Advised her that I'm sorry she is not feeling well but we are in clinic and will call her as soon as we can and our team is aware she does not feel well. Advised patient someone would call her back shortly.

## 2022-11-21 NOTE — Unmapped (Signed)
Gynecology Oncology History and Physical    Chief complaint: No chief complaint on file.    HPI: Kelsey Sullivan is 81 y.o. 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 This patient is s/p C6 Carbo/Taxol, C1 Rubraca, and now s/p C3 Day 16 Carbo/Gem/Bev.     She presents to [facility] as a direct admit in the setting of anemia and leukocytosis. Patient reports calling in yesterday due to fatigue and episodes of epistaxis. She was then told to get lab work that demonstrated anemia and leukocytosis and recommended to present for direct admission.    On evaluation, patient well appearing. Reports that she is anxious about her CT scan concerning for her thrombotic disease and wants to know if clot is resolving. Otherwise denies abdominal pain, N/V. Reports decreased appetite which has been her baseline. Reports episodes of watery diarrhea since Monday, reports eating Chinese food on Monday afternoon as likely culprit. Last episode of loose stool yesterday morning. Denies > 2-3 stools per day. Denies vaginal bleeding, blood in urine or blood in stool. No concerns voiding, denies dysuria. Denies CP, SOB, cough, nasal congestion. Denies F/C at home.    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 Pt declined adjuvant therapy      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     Posterior cul de sac biopsy-metastatic adenocarcinoma.     Omentectomy- metastatic adenocarcinoma     LND-negative     Residual right IP ligament, Anterior cul de sac biopsy, right pelvic side will biopsy, left pelvic side wall, right gutter and left gutter-->benign    04/06/2017:   CT [name]- mild small bowel obstruction with transition point in the left pelvis adjacent to the anterior abdominal wall. Low density in the common femoral veins bilaterally thought likely to represent flow artificat, may consider follow up with lomer extremity Doppler to ensure there is no underlying deep venous thrombosis.    01/29/2018:   CTAP:  Interval development of necrotic retroperitoneal lymphadenopathy with largest node measuring 4.9  4.2 x 4.8 cm. Origin is indeterminate.  Options would include CT directed biopsy as well as PET/CT.    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  No other focal areas of FD avid malignancy are found. Specifically, no lymph node activity is found outside of the retroperitoneum and there is no evidence for FDG avid pulmonary, skeletal or hepatic parenchymal metastasis.    02/2018:   Patient saw Dr. Ishmael Holter who recommended Carbo/Taxol with an AUC of 6.  Dr. Maren Reamer office  submitted foundation one testing. Patient would like Genetic Testing    04/12/2018:   IR port placed    04/16/2018:   C1 weekly taxol 80 mg/m2 D1,8,15, Carboplatin AUC 6 D1 q 21 days. PCEs on thurs, txt on fridays. EXAMS ON EVEN CYCLES     CA 125: 523.4     Genetic testing: Neg for CS mutation, no VUS, lifetime remaining breast cancer risk -3.4%    05/07/2018:   C2 weekly taxol 80 mg/m2 D1,8,15, Carboplatin AUC 6 D1 q 21 days. PCEs on thurs, txt on fridays. EXAMS ON EVEN CYCLES     CA 125: 162.2    05/20/2018:   Early PCE due to scheduling, due 6/21    05/28/2018  Delay C3 due to low ANC     CA 125: 25    06/04/2018  C3 weekly taxol 80 mg/m2 D1,8,15, Carboplatin AUC 6 D1 q 21 days. EXAMS ON EVEN CYCLES.      CA 125    06/25/2018:   HOLD chemo due to low anc (1047)- defer to next week, and change to q 28 days cycle to build in an off week to allow for counts to recover     CA 125: 10.6    07/02/2018:   C4 weekly taxol 80 mg/m2 D1,8,15, (missed day 5 due to admission) Carboplatin AUC 6 D1 q 28 days. ANC still lowish 1480s- neulasta Day 16      EXAMS ON EVEN CYCLES.  Review her genetics testing results      CA 125 7.2    07/07/2018:   [facility] admission: SBO, neutropenic fever     CTAP:Small bowel obstruction with transition point in the anterior mid abdomen near the midline. Small amount of free fluid in the abdomen, which is nonspecific. 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     Peritoneum: Small amount of free fluid in the abdomen. A para-aortic density with central low attenuation measures 2.5 x 2 cm and is significantly decreased in size from prior.    07/16/2018:   CA 125: 28.5    07/30/2018:  C5 weekly taxol 80 mg/m2 D1,8,15, Carboplatin AUC 6 D1 q 28 days. Neulasta Day 16      EXAMS ON EVEN CYCLES.      CA 125: 11.4    08/27/2018:   C6 weekly taxol 80 mg/m2 D1,8,15, Carboplatin AUC 6 D1 q 28 days. Neulasta Day 16. Order Ct CAP. RTC 10/11 (3  weeks)     EXAMS ON EVEN CYCLES. Discussed option of maintenance parp I- pt not decided (doesn't want to feel tired anymore)     CA 125: 8.7    09/20/2018:   Port removed: While the skin surface appeared inflamed, there was no evidence of infection in the port reservoir.    09/22/2018:   CT Chest: No evidence of thoracic metastatic disease.     Suspected small seroma in the base of the  right neck.  Necrotic metastatic lesion could also have this appearance but considered less likely.     CtAP: Interval decrease in size of a left periaortic necrotic lymph node. No evidence of new metastatic disease.     Lymphatics: Previously demonstrated left para-aortic lymph node with central necrosis currently measures 1.2 cm on series 5 image 45, previously measured 1.8 cm in the short axis.    10/05/2018:   Reviewed scans- aortic node has decreased in size uncertain if active cancer or just scarring. Check PET (to investigate of node is hypermetabolic as well as assess for liver implant previously noted on pet. Offered maintenance with parp I- rubraca 600 mg BID. Pt interested-order placed thru spec pharmacy. RTC BC in a few weeks to start drug and review PET     10/15/2018:   PET: Resolution of the previous hypermetabolic para-aortic lymph nodes and hepatic dome lesions. Hypermetabolic focus within the left thyroid lobe.  Consider further evaluation with ultrasound, if clinically indicated.     NECK: There is a focus of uptake within the left thyroid lobe (axial image 62), with max SUV of 3.3    10/20/2018:   Thyroid US: 1.2 cm irregular hypoechoic nodule in the left thyroid lobe is suspicious. Recommend FNA for further evaluation.     1.0 cm nodule right thyroid lobe containing macrocalcification is indeterminate. Recommend follow-up thyroid ultrasound in one year to evaluate for stability.     Recommend= FNA for suspicious left thyroid nodule     10/21/2018:   Order thyroid FNA. Pt has not gotten her rubraca yet due to  financial issues. RTC2 weeks for PCE#1 (11/12/18)     CA 125:   4.8    11/12/2018:   C1 rubraca 600 mg po BID. RTC 2 weeks for labs (CBC, CMP, 4 weeks for PCE #2). Pt plans to go to [place] and may leave before PCE and re-est care at [facility] (Dr. Gerilyn Pilgrim). Referral to ENT placed (pt cancelled appt for 11/22) and FNA was prev ordered for thyroid nodule- but has not been done.      CA 125: 5.5    11/18/2018:   Prob visit for symptoms from rubraca:For supportive meds, discussed recommendation for nausea control to include Zofran three times a day, 30 min before morning dose of PARP inhibitor, and 30 min before meals and Pepcid complete 2 x a day or prilosec. For insomnia, benadryl at night and see if it improves fatigue during the day.  Of note, she is not interested in future if needed to try Ritalin. Pt to call in 1 week to review symptoms    11/26/18:   Pt calls to report she is doing better on above meds. Only complaint is sleeping too much. Advised she can cut the benadryl dose in half.     12/03/2018:   Pt self d/c'd rubraca due to side effects (decreased energy mainly, some nausea)    12/10/2018:   Pt desires to start again on lower dose- rx to speciality pharmacy for rubraca 500 mg po BID. Pt headed to [facility]- explained she needs to have labs checked q 2 weeks initially and to be followed by oncologist (Dr. Bayard Males, [facility]) fax [phone]    02/2019:   Pt stops rubraca due to side effects, dose has been reduced per pt    03/02/2019:   CTAP: ([facility] [place]): NED. Partially calcified right thyroid nodule and hypodense 4 mm thyroid nodule    06/13/2019:  CA 125: 5. RTC 3 mos for CA 125, exam. Pt to go back to [place] in 09/2019. Recommended follow up for thyroid nodule    09/15/2019:   CA 125: 6.1. Pt to go back to [place] in 09/2019. Recommended follow up for thyroid nodule. Sent letter to Dr. Gerilyn Pilgrim in [place] fax [phone]    05/31/2020:   CA 125: 5.9    08/16/2020:   CA 125: 6.2, NED    11/15/2020:   CA 125:  5.9    06/24/2021:   CA 125: 6,. NED, plans to move to [place]    08/21/2022:   CA 125: 8.5, NED, offered CTs as she has had some GI issues recently- resolved w omeprazole     08/25/2022:   PS Recurrence #2: CT AP: 2.6 cm lesion along the dome of the right hepatic lobe may represent a peritoneal lesion along the undersurface of the diaphragm rather than a true hepatic parenchymal lesion. 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   Lymphatics: Previously demonstrated left para-aortic lymph node has resolved in the interim. No new or enlarging lymph nodes. Post surgical changes related to lymph node dissection.   Peritoneum/Retroperitoneum: Subtle thickening of the peritoneum in the paracolic gutters, left greater than right. Mild thickening of the peritoneum in the pelvis posteriorly.   Ct C: NED- Few scattered sub-4 mm pulmonary nodules are unchanged, likely benign, with no new suspicious or enlarging pulmonary nodules for example 2 mm right lower lobe pulmonary nodule (series 5, image 224). . Nodular density related to the right hemidiaphragm on image 244 is related to an infradiaphragmatic peritoneal implant.   Plan PET to eval for FDG activity    08/29/2022:   PET:  Two FDG avid foci at the superior surface of right hepatic lobe, favored to be peritoneal nodules along the undersurface of right hemidiaphragm, concerning for metastases.      ABDOMEN AND PELVIS: *  Two FDG avid foci are seen along the surface of the right liver dome with Max SUV 9.9 on axial image 112 and max SUV 5.1 on axial image 108. *  No additional abnormal liver uptake.     9/28.2023:   FU- pet c/w recurrence- offer carbo gem bev- start 10/10 at [facility], surg onc ref for port placement     09/15/2022:   scheduled for Power Port Insertion with Dr. Andrey Campanile    09/16/2022:   C1 carboplatin AUC4, gem 1000 mg/m2 (D1/8), Bev D1 q 21 days. 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 Neulasta D9 (started C2), RTC 3 weeks 11/30 C3  CA 125: 7.9    11/12-13/2023: [facility] admisison- Extensive LUE/shoulder/neck DVTPatient was admitted as above. She was started on a heparin drip and transitioned to eliquis on HD1. Vascular surgery was consulted with no further recommendations. Surgical Oncology was consulted to assess for need for port removal. Her port was accessed without difficulty and noted to be functioning well, therefore removal was  not indicated. She will continue eliquis 10 mg BID for 7 days (11/13-11/19) followed by eliquis 5mg  BID for 6 months. Per surgical oncology, she only needs repeat imaging if the swelling gets worse while she is on [facility] or if the port becomes non-functional. There is no particular need for re-imaging in the next week, but may consider in the time frame of several weeks to months if there is no issues.    11/06/2022:   C3 carboplatin AUC4, gem 1000 mg/m2 (D1/8), Bev D1 q 21 days. 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 Neulasta D9 (started C2), LUE/neck DVTs 10/2022- on eliquis. RTC 12/14 early PCE w CTs prior, inf on 12/21,  CA 125: 11.1    11/17/2022:   CTAP: Interval decrease in size of the peritoneal nodule in the dome of the right hepatic lobe. No evidence of new metastatic disease. he lesion along the dome of the right hepatic lobe is not demonstrable on axial images. On sagittal images, it appears much smaller, having the appearance of a peritoneal nodule under the diaphragmatic surface, measuring 1.6 x 0.6 cm (series 602, image 41), previously 1.9 x 1.5 cm.   CTC: NED    11/20/2022:   Early PCE, review CTs- PR  after C3, C4 12/21, RTC 1/11 C5 CB- doing ok- had some nose bleeds last week, better now. Recommend nasal saline- she is on DOAC, bev- so higher risk for bleeding. Had diarrhea after chinese, getting better     11/21/2022:   Pt called in feeling poorly - WBC 16, plts 20s, hgb 7s- admit for blood, infectious work up    11/27/2022:   Plan C4 carboplatin AUC4, gem 1000 mg/m2 (D1/8), Bev D1 q 21 days. 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 Neulasta D9 (started C2), LUE/neck DVTs 10/2022- on eliquis.   CA 125      Disposition: chemo, imaging after 3 cucles, CA 125, possible parp maintenance after, genetic testing neg, [facility] one testing results from [facility] in [place]- this was not performed   Current disease status: Neg for CS mutation, no VUS, lifetime remaining breast cancer risk -3.4%  Genetics: negative for mutations, breast cancer lifetime risk 3.4%  Survivorship plan: pending completion of treatment              Past Medical History:   Diagnosis Date    Bowel obstruction (CMS-HCC) 2018    Ovarian cancer (CMS-HCC) 2014    Thyroid disease        Past Surgical History:   Procedure Laterality Date    ABDOMINOPLASTY  1999    BLEPHAROPLASTY  2000    DILATION AND CURETTAGE OF UTERUS  1979    ELBOW SURGERY  1990    EYE SURGERY  1994    INSERTION CATHETER VASCULAR ACCESS N/A 09/15/2022    Procedure: INSERTION POWER PORT A CATH;  Surgeon: Garrel Ridgel, MD;  Location: [facility] OR;  Service: General;  Laterality: N/A;  Left Subclavian    KNEE ARTHROPLASTY Right     ROTATOR CUFF REPAIR      TRANSUMBILICAL AUGMENTATION MAMMAPLASTY      VAGINAL HYSTERECTOMY  1979       Social History     Socioeconomic History    Marital status: Married     Spouse name: Not on file    Number of children: Not on file    Years of education: Not on file    Highest education level: Not on file  Occupational History    Not on file   Tobacco Use    Smoking status: Some Days     Packs/day: 0     Types: Cigarettes    Smokeless tobacco: Never    Tobacco  comments:     Smokes 1 pack every three days    Vaping Use    Vaping Use: Never used   Substance and Sexual Activity    Alcohol use: Yes     Alcohol/week: 14.0 standard drinks of alcohol     Types: 14 Shots of liquor per week    Drug use: No    Sexual activity: Not Currently   Other Topics Concern    Caffeine Use Yes    Occupational Exposure No    Exercise Yes    Seat Belt Yes   Social History Narrative    Mammogram-6 years ago and it was normal. Pt stated she isn't getting them anymore.      Colonoscopy-10 years ago        Pt reports she can lie flat in bed without SOB.    Pt states she can walk a flight of stairs and city block without chest pain and SOB>       Social Determinants of Health     Financial Resource Strain: Not on file   Food Insecurity: No Food Insecurity (11/21/2022)    Hunger Vital Sign     Worried About Running Out of Food in the Last Year: Never true     Ran Out of Food in the Last Year: Never true   Transportation Needs: No Transportation Needs (11/21/2022)    PRAPARE - Therapist, art (Medical): No     Lack of Transportation (Non-Medical): No   Physical Activity: Not on file   Stress: Not on file   Social Connections: Not on file   Intimate Partner Violence: Not At Risk (11/21/2022)    Humiliation, Afraid, Rape, and Kick questionnaire     Fear of Current or Ex-Partner: No     Emotionally Abused: No     Physically Abused: No     Sexually Abused: No   Housing Stability: Low Risk  (11/21/2022)    Housing Stability Vital Sign     Unable to Pay for Housing in the Last Year: No     Number of Places Lived in the Last Year: 1     Unstable Housing in the Last Year: No       No family history on file.    Current Facility-Administered Medications   Medication Dose Frequency Provider Last Admin    lactated Ringers  125 mL/hr Continuous Garnet Sierras, MD 125 mL/hr at 11/21/22 1719    levothyroxine  75 mcg QAM AC Garnet Sierras, MD      lisinopriL  10 mg Daily 0900 Garnet Sierras, MD          Allergies   Allergen Reactions    Nsaids (Non-Steroidal Anti-Inflammatory Drug)      Stomach ache    Sulfamethoxazole-Trimethoprim Other (See Comments)     Hallucinations, anxiety  Other reaction(s): Unknown  loopy  hallucinations    Celecoxib Nausea And Vomiting    Metoclopramide Other (See Comments)     Does not remember       O:  Vitals:    11/21/22 2130 11/21/22 2230 11/21/22 2306 11/22/22 0346   BP: 127/79 128/80 126/80 132/76   BP Location:    Right upper arm  Patient Position:    Lying   Pulse: 74 76 75 68   Resp: 16 16 16 16    Temp: 97.9 F (36.6 C) 97.9 F (36.6 C) 97.9 F (36.6 C) 97.9 F (36.6 C)   TempSrc: Oral Oral Oral Oral   SpO2: 100% 100% 99% 99%   Weight:       Height:         Gen: NAD  CV: well perfused  Resp: moving air well, no respiratory distress  Abd: soft, non-tender, non-distended  Ext: strong peripheral pulses, no edema    Labs:  Lab Results   Component Value Date    WBC 15.4 (H) 11/22/2022    HGB 7.6 (L) 11/22/2022    HCT 22.1 (L) 11/22/2022    MCV 98.8 11/22/2022    PLT 22 (L) 11/22/2022     Lab Results   Component Value Date    CREATININE 0.73 11/21/2022    BUN 13 11/21/2022    NA 134 11/21/2022    K 3.8 11/21/2022    CL 101 11/21/2022    CO2 24 11/21/2022       Imaging:  11/06/22 CT Chest with IV Contrast  No Evidence of Intrathoracic Metastatic Disease    11/06/22 CTAP w/IV Contrast  Interval decrease in size of the peritoneal nodule in the dome of the right hepatic lobe. No evidence of new metastatic disease.    Cultures:  Blood Cx - Pending  Urine Cx - Pending    Pathology:  N/A    [name]: Kelsey Sullivan is 81 y.o. 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 This patient is s/p C6 Carbo/Taxol, C1 Rubraca, now s/p C3 Day 16 Carbo/Gem/Bev, and admitted for workup of anemia and  leukocytosis.    Cardiovascular  HTN  - Lisinopril 10mg  daily, reordered inpatient  - BP normotensive here    Anemia  - 12/15 Hgb 7.1 -> 1u pRBC ordered, will follow up post-transfusion CBC  - no signs of ongoing bleeding, patient denies melena or hematuria or vaginal bleeding  - will collect stool guaiac to confirm no source of melena    Thrombocytopenia  - 12/15 Plt 20.9   - will hold Eliquis at this time  - will continue to monitor, if < 20, will transfuse    Respiratory  - NAI    FEN/GI/Renal   - NAI  - Replace electrolytes PRN to maintain K>4, Phos >3, Mag >2    ID  Leukocytosis  - 12/15 WBC 16.8, ANC 16109  - afebrile on admission  - Blood Cx ordered  - UA Ordered  - Urine Cx ordered  - CXR ordered  - Respiratory Virus Panel Ordered    Heme/Onc  Stage IIIC High Grade Serous Ovarian Adenocarcinoma  - 12/28/2012 - BSO, Omentectomy, LND. Diagnostic of serous ovarian adenocarcinoma. Omentectomy with metastatic adenocarcinoma. LND neg.   - 01/08/2013 - Refused chemotherapy and did alternative tx with raw diet for 1 year  - 04/06/2017 - CT [name] mild SBO  - 01/29/2018 - CT [name] interval development of necrotic retroperitoneal lymphadenopathy with largest node  - 02/18/2018 CA125: 197.8  - 02/26/2018 CT guided left retroperitoneal node biopsy w/focally involved by non-small cell malignancy- additional stains are diagnostic of non-small cell carcionoma and supportive of involve82ment by the patients reported previous known  ovarian malignancy.  - 03/04/2018 FDG avid retroperitoneal lymphadenopathy as described above and an FDG avid lesion over the dome of the liver most likely a peritoneal implant rather than a liver metastasis.   - 04/12/2018 IR port placed  - 06/04/2018 s/p C3 Carbo/Taxol  - 06/25/2018 held chemo due to low anc (1047)   - 08/27/2018 s/p C6 Carbo/Taxol  - 09/20/2018 Port Removed  - 09/22/2018 CT CAP without evidence of thoracic metastatic disease and interval decrease in size of a left periaortic necrotic  lymph node   - 10/15/2018 PET scan, resolution of previous hypermetabolic para-aortic lymph nodes and hepatic dome lesions. Hypermetabolic focus in left thyroid lobe  - 10/19/2018 Neck US with  1.2 cm irregular hypoechoic nodule in the left thyroid lobe is suspicious  - 11/12/2018: C1 rubraca  - 03/02/2019 CT Scan from [facility], no evidence for recurrent or new neoplasm. Increased colonic stool could indicate constipation. Postoperative changes  - 06/13/2019-02/06/2021, Stable CA125 (< 6.5)  - 08/25/22 CT a/p w/ 2.6 cm lesion along the dome of the right hepatic lobe may represent a peritoneal lesion along the undersurface of the diaphragm rather than a true hepatic parenchymal lesion. 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   - 08/29/22 PET with Two FDG avid foci at the superior surface of right hepatic lobe, favored to be peritoneal nodules along the undersurface of right hemidiaphragm, concerning for metastases.  - 09/15/22 Port insertion  - 11/06/22 s/p C3 Carbo/Gem/Bev  - Planned C4 11/27/22    LUE/neck DVTs  - noted to have LUE/neck DVTs 10/2022  - currently on eliquis 5mg  BID x 6 months, held inpatient in setting of thrombocytopenia    Endocrine  Thyroid Disease  - 10/15/2018 PET scan, resolution of previous hypermetabolic para-aortic lymph nodes and hepatic dome lesions. Hypermetabolic focus in left thyroid lobe  - 10/19/2018 Neck US with  1.2 cm irregular hypoechoic nodule in the left thyroid lobe is suspicious  - 03/02/2019: CTAP: NED. Partially calcified right thyroid nodule and hypodense 4 mm thyroid nodule  - TSH 1.66 (10/21/18) -> 12.01 (05/31/20) -> 5.91 (08/16/20) -> 2.62 (11/15/20)    Neuro  - NAI    MSK  - NAI    Prophylaxis  DVT  - SCD's  - eliquis 5mg  BID for LUE/neck DVTs, held currently    Dispo: Admit to Gynecologic Oncology    Plan of care discussed with Dr. Janyth Contes, chief resident, and  Dr. Maryruth Bun, attending physician on call.     Garnet Sierras, MD PGY-2  Obstetrics and Gynecology Resident

## 2022-11-21 NOTE — Unmapped (Signed)
Problem: Acute Pain  Description: Patient's pain progressing toward patient's stated pain goal  Goal: Patient displays improved well-being such as baseline levels for pulse, BP, respirations and relaxed muscle tone or body posture  Outcome: Progressing  Goal: Patient will manage pain with the appropriate technique/intervention  Description: Assess and monitor patient's pain using appropriate pain scale. Collaborate with interdisciplinary team and initiate plan and interventions as ordered.  Re-assess patient's pain level 30-60 minutes after pain management intervention.  Outcome: Progressing  Goal: Patient will reduce or eliminate use of analgesics  Outcome: Progressing  Goal: Patients pain is managed to allow active participation in daily activities  Outcome: Progressing  Goal: Patient verbalizes a reduction in pain level  Outcome: Progressing

## 2022-11-21 NOTE — Unmapped (Signed)
Patient calling in, reporting fatigue and concerned about her lab levels. Will have patient come in this afternoon for a lab check. Orders placed, will call patient with results    Future Appointments   Date Time Provider Department Center   11/21/2022  2:15 PM Port Chair 1 Bc3 [facility] PORT 3 [facility]   11/27/2022 10:15 AM PORT CHAIR 2 BC3 [facility] PORT 3 [facility]   11/27/2022 10:45 AM CHAIR 11 INF 3 [facility] INF3 [facility]   12/04/2022  9:00 AM Port Chair 1 Bc3 [facility] PORT 3 [facility]   12/04/2022  9:30 AM CHAIR 05 INF 3 BC [facility] INF3 [facility]   12/18/2022  9:45 AM Port Chair 1 Bc3 [facility] PORT 3 [facility]   12/18/2022 10:15 AM Mikle Bosworth, MD [facility] GYN3 [facility]   12/18/2022 11:15 AM CHAIR 01 INF 3 BC [facility] INF3 [facility]

## 2022-11-21 NOTE — Unmapped (Signed)
Spoke to patient regarding her labs. Patient will be direct admitted for anemia and infectious work up. Patient aware of need to report to discharge center to be checked into room. Inpatient team aware.     Future Appointments   Date Time Provider Department Center   11/27/2022 10:15 AM PORT CHAIR 2 BC3 [facility] PORT 3 [facility]   11/27/2022 10:45 AM CHAIR 11 INF 3 [facility] INF3 [facility]   12/04/2022  9:00 AM Port Chair 1 Bc3 [facility] PORT 3 [facility]   12/04/2022  9:30 AM CHAIR 05 INF 3 BC [facility] INF3 [facility]   12/18/2022  9:45 AM Port Chair 1 Bc3 [facility] PORT 3 [facility]   12/18/2022 10:15 AM Mikle Bosworth, MD [facility] GYN3 [facility]   12/18/2022 11:15 AM CHAIR 01 INF 3 BC [facility] INF3 [facility]

## 2022-11-21 NOTE — Unmapped (Signed)
1330 pm -  Patient arrived for port labs only today. Allergies reviewed with patient. Port accessed per protocol without difficulty. Epic lab draw as ordered. Per protocol port flushed with 20ml of normal saline, followed with 5ml heparinized saline 500units/ml. Huber needle removed, band aid applied. See flow sheet for port details.     11/21/22 1330   Specimen Collection Status   Specimen Collection Unit   Port A Cath Left Chest   No placement date or time found.   Orientation: Left  Location: Chest   Line Intervention Deaccessed   Site Assessment No problems identified   Line Status Blood return noted;Flushed   Needle Size 20 gauge, 0.75 huber   Dressing Type Gauze   Accessed/Deaccessed by: R. Talmadge Coventry RN / R. Talmadge Coventry RN   Access Attempts 1   Flush Performed Yes

## 2022-11-21 NOTE — Unmapped (Signed)
Addended by: Ollen Barges on: 11/21/2022 11:08 AM     Modules accepted: Orders

## 2022-11-21 NOTE — Unmapped (Signed)
Kelsey Sullivan is a 81 y.o. female direct admit on 11/21/2022 at 1615. Patient arrived to 2604/U2604 via bed. Patient placed on RA. Please refer to documentation flowsheets for a complete assessment and vital signs. Admission orders reviewed. Patient oriented to room and unit. Bed alarm activated. Bed in lowest position. Patient educated on use of call light and verbalized understanding. Will continue to monitor.     Ambrose Pancoast, RN

## 2022-11-22 DIAGNOSIS — D649 Anemia, unspecified: Secondary | ICD-10-CM

## 2022-11-22 LAB — CBC
Hematocrit: 22.1 % (ref 35.0–45.0)
Hematocrit: 22.5 % (ref 35.0–45.0)
Hematocrit: 25.5 % (ref 35.0–45.0)
Hemoglobin: 7.6 g/dL (ref 11.7–15.5)
Hemoglobin: 7.8 g/dL (ref 11.7–15.5)
Hemoglobin: 8.8 g/dL (ref 11.7–15.5)
MCH: 34.2 pg (ref 27.0–33.0)
MCH: 33.8 pg (ref 27.0–33.0)
MCH: 34 pg (ref 27.0–33.0)
MCHC: 34.6 g/dL (ref 32.0–36.0)
MCHC: 34.4 g/dL (ref 32.0–36.0)
MCHC: 34.4 g/dL (ref 32.0–36.0)
MCV: 98.8 fL (ref 80.0–100.0)
MCV: 98.2 fL (ref 80.0–100.0)
MCV: 98.8 fL (ref 80.0–100.0)
MPV: 8.8 fL (ref 7.5–11.5)
MPV: 9.2 fL (ref 7.5–11.5)
MPV: 9.5 fL (ref 7.5–11.5)
Platelets: 22 10*3/uL (ref 140–400)
Platelets: 23 10*3/uL (ref 140–400)
Platelets: 26 10*3/uL (ref 140–400)
RBC: 2.24 10*6/uL (ref 3.80–5.10)
RBC: 2.29 10*6/uL (ref 3.80–5.10)
RBC: 2.58 10*6/uL (ref 3.80–5.10)
RDW: 15.8 % (ref 11.0–15.0)
RDW: 15.7 % (ref 11.0–15.0)
RDW: 15.4 % (ref 11.0–15.0)
WBC: 15.4 10*3/uL (ref 3.8–10.8)
WBC: 13.8 10*3/uL (ref 3.8–10.8)
WBC: 14.4 10*3/uL (ref 3.8–10.8)

## 2022-11-22 LAB — URINALYSIS W/RFL TO MICROSCOPIC
Bilirubin, UA: NEGATIVE
Blood, UA: NEGATIVE
Glucose, UA: NEGATIVE mg/dL
Ketones, UA: NEGATIVE mg/dL
Leukocytes, UA: NEGATIVE
Nitrite, UA: NEGATIVE
Protein, UA: NEGATIVE mg/dL
Specific Gravity, UA: 1.014 (ref 1.005–1.035)
Urobilinogen, UA: <2 mg/dL (ref 0.2–1.9)
pH, UA: 6 (ref 5.0–8.0)

## 2022-11-22 LAB — BASIC METABOLIC PANEL
Anion Gap: 8 mmol/L (ref 3–16)
BUN: 12 mg/dL (ref 7–25)
CO2: 26 mmol/L (ref 21–33)
Calcium: 8.7 mg/dL (ref 8.6–10.3)
Chloride: 104 mmol/L (ref 98–110)
Creatinine: 0.51 mg/dL — ABNORMAL LOW (ref 0.60–1.30)
EGFR: >90
Glucose: 102 mg/dL — ABNORMAL HIGH (ref 70–100)
Osmolality, Calculated: 286 mosm/kg (ref 278–305)
Potassium: 4.5 mmol/L (ref 3.5–5.3)
Sodium: 138 mmol/L (ref 133–146)

## 2022-11-22 LAB — PHOSPHORUS: Phosphorus: 4.1 mg/dL (ref 2.1–4.5)

## 2022-11-22 LAB — BLOOD CULTURE LINE DRAW: Culture Result: NO GROWTH

## 2022-11-22 LAB — BLOOD CULTURE-PERIPHERAL: Culture Result: NO GROWTH

## 2022-11-22 LAB — MAGNESIUM: Magnesium: 2.5 mg/dL (ref 1.5–2.5)

## 2022-11-22 MED ORDER — sodium chloride 0.9 % infusion
INTRAVENOUS | Status: AC
Start: 2022-11-22 — End: 2022-11-22
  Administered 2022-11-22: 16:00:00 20 mL/h via INTRAVENOUS

## 2022-11-22 MED ORDER — cetirizine (ZYRTEC) tablet 10 mg
10 | Freq: Every day | ORAL | Status: AC
Start: 2022-11-22 — End: 2022-11-24
  Administered 2022-11-22 – 2022-11-24 (×3): 10 mg via ORAL

## 2022-11-22 MED ORDER — heparin (porcine) 5,000 unit/mL injection
5000 | INTRAMUSCULAR | Status: AC
Start: 2022-11-22 — End: 2022-11-22

## 2022-11-22 MED ORDER — sodium chloride (OCEAN) 0.65 % nasal spray 1 spray
0.65 | NASAL | Status: AC | PRN
Start: 2022-11-22 — End: 2022-11-24

## 2022-11-22 MED FILL — POTASSIUM CHLORIDE ER 20 MEQ TABLET,EXTENDED RELEASE(PART/CRYST): 20 20 MEQ | ORAL | Qty: 1

## 2022-11-22 MED FILL — CETIRIZINE 10 MG TABLET: 10 10 MG | ORAL | Qty: 1

## 2022-11-22 MED FILL — LEVOTHYROXINE 75 MCG TABLET: 75 75 MCG | ORAL | Qty: 1

## 2022-11-22 MED FILL — LISINOPRIL 5 MG TABLET: 5 5 MG | ORAL | Qty: 2

## 2022-11-22 MED FILL — HEPARIN (PORCINE) 5,000 UNIT/ML INJECTION SOLUTION: 5000 5,000 unit/mL | INTRAMUSCULAR | Qty: 1

## 2022-11-22 MED FILL — SALINE MIST 0.65 % NASAL SPRAY AEROSOL: 0.65 0.65 % | NASAL | Qty: 45

## 2022-11-22 MED FILL — MAGNESIUM SULFATE 4 GRAM/100 ML (4 %) IN WATER INTRAVENOUS PIGGYBACK: 4 4 gram/100 mL (4 %) | INTRAVENOUS | Qty: 100

## 2022-11-22 NOTE — Unmapped (Addendum)
Gynecology Oncology Progress Note    Chief complaint: No chief complaint on file.    HPI: Kelsey Sullivan is 81 y.o. 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 This patient is s/p C6 Carbo/Taxol, C1 Rubraca, and now s/p C3 Day 17 Carbo/Gem/Bev.     She presents to [facility] as a direct admit in the setting of anemia and leukocytosis. Patient reports calling in yesterday due to fatigue and episodes of epistaxis. She was then told to get lab work that demonstrated anemia and leukocytosis and recommended to present for direct admission.    This morning, patient feels overall well. Thinks that her hemoglobin is low due to her nosebleed after eating [place] food on Monday. Reports that she was dripping blood for at least 24hr. Reports mild epistaxis overnight that resolved quickly. Denies any abdominal pain. Reports tolerating regular diet overnight with N/V. Voiding spontaneously, passing flatus. Denies CP, SOB.    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 Pt declined adjuvant therapy      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       L ovary and fallopian  tube- serous adenocarcinoma, high grade, 4 cm, favor metastasis.     Posterior cul de sac biopsy-metastatic adenocarcinoma.     Omentectomy- metastatic adenocarcinoma     LND-negative     Residual right IP ligament, Anterior cul de sac biopsy, right pelvic side will biopsy, left pelvic side wall, right gutter and left gutter-->benign    04/06/2017:   CT [name]- mild small bowel obstruction with transition point in the left pelvis adjacent to the anterior abdominal wall. Low density in the common femoral veins bilaterally thought likely to represent flow artificat, may consider follow up with lomer extremity Doppler to ensure there is no underlying deep venous thrombosis.    01/29/2018:   CTAP:  Interval development of necrotic retroperitoneal lymphadenopathy with largest node measuring 4.9  4.2 x 4.8 cm. Origin is indeterminate.  Options would include CT directed biopsy as well as PET/CT.    02/18/2018:   CA 125:  197.8    02/26/2018:   CT guided Left retroperitoneal node biopsy- focally involved by non-small cell malignancy- additional stains are diagnostic of non-small cell carcionoma and supportive of involve31ment by the patients reported previous known ovarian malignancy.    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  No other focal areas of FD avid malignancy are  found. Specifically, no lymph node activity is found outside of the retroperitoneum and there is no evidence for FDG avid pulmonary, skeletal or hepatic parenchymal metastasis.    02/2018:   Patient saw Dr. Ishmael Holter who recommended Carbo/Taxol with an AUC of 6.  Dr. Maren Reamer office submitted foundation one testing. Patient would like Genetic Testing    04/12/2018:   IR port placed    04/16/2018:   C1 weekly taxol 80 mg/m2 D1,8,15, Carboplatin AUC 6 D1 q 21 days. PCEs on thurs, txt on fridays. EXAMS ON EVEN  CYCLES     CA 125: 523.4     Genetic testing: Neg for CS mutation, no VUS, lifetime remaining breast cancer risk -3.4%    05/07/2018:   C2 weekly taxol 80 mg/m2 D1,8,15, Carboplatin AUC 6 D1 q 21 days. PCEs on thurs, txt on fridays. EXAMS ON EVEN CYCLES     CA 125: 162.2    05/20/2018:   Early PCE due to scheduling, due 6/21    05/28/2018  Delay C3 due to low ANC     CA 125: 25    06/04/2018  C3 weekly taxol 80 mg/m2 D1,8,15, Carboplatin AUC 6 D1 q 21 days. EXAMS ON EVEN CYCLES.      CA 125    06/25/2018:   HOLD chemo due to low anc (1047)- defer to next week, and change to q 28 days cycle to build in an off week to allow for counts to recover     CA 125: 10.6    07/02/2018:   C4 weekly taxol 80 mg/m2 D1,8,15, (missed day 5 due to admission) Carboplatin AUC 6 D1 q 28 days. ANC still lowish 1480s- neulasta Day 16      EXAMS ON EVEN CYCLES.  Review her genetics testing results      CA 125 7.2    07/07/2018:   [facility] admission: SBO, neutropenic fever     CTAP:Small bowel obstruction with transition point in the anterior mid abdomen near the midline. Small amount of free fluid in the abdomen, which is nonspecific. 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     Peritoneum: Small amount of free fluid in the abdomen. A para-aortic density with central low attenuation measures 2.5 x 2 cm and is significantly decreased in size from prior.    07/16/2018:   CA 125: 28.5    07/30/2018:  C5 weekly taxol 80 mg/m2 D1,8,15, Carboplatin AUC 6 D1 q 28 days. Neulasta Day 16      EXAMS ON EVEN CYCLES.      CA 125: 11.4    08/27/2018:   C6 weekly taxol 80 mg/m2 D1,8,15, Carboplatin AUC 6 D1 q 28 days. Neulasta Day 16. Order Ct CAP. RTC 10/11 (3 weeks)     EXAMS ON EVEN CYCLES. Discussed option of maintenance parp I- pt not decided (doesn't want to feel tired anymore)     CA 125: 8.7    09/20/2018:   Port removed: While the skin surface appeared inflamed, there was no evidence  of infection in the port reservoir.    09/22/2018:   CT Chest: No evidence of thoracic metastatic disease.     Suspected small seroma in the base of the right neck.  Necrotic metastatic lesion could also have this appearance but considered less likely.     CtAP: Interval decrease in size of a left periaortic necrotic lymph node. No evidence of new metastatic disease.  Lymphatics: Previously demonstrated left para-aortic lymph node with central necrosis currently measures 1.2 cm on series 5 image 45, previously measured 1.8 cm in the short axis.    10/05/2018:   Reviewed scans- aortic node has decreased in size uncertain if active cancer or just scarring. Check PET (to investigate of node is hypermetabolic as well as assess for liver implant previously noted on pet. Offered maintenance with parp I- rubraca 600 mg BID. Pt interested-order placed thru spec pharmacy. RTC BC in a few weeks to start drug and review PET     10/15/2018:   PET: Resolution of the previous hypermetabolic para-aortic lymph nodes and hepatic dome lesions. Hypermetabolic focus within the left thyroid lobe.  Consider further evaluation with ultrasound, if clinically indicated.     NECK: There is a focus of uptake within the left thyroid lobe (axial image 62), with max SUV of 3.3    10/20/2018:   Thyroid US: 1.2 cm irregular hypoechoic nodule in the left thyroid lobe is suspicious. Recommend FNA for further evaluation.     1.0 cm nodule right thyroid lobe containing macrocalcification is indeterminate. Recommend follow-up thyroid ultrasound in one year to evaluate for stability.     Recommend= FNA for suspicious left thyroid nodule     10/21/2018:   Order thyroid FNA. Pt has not gotten her rubraca yet due to financial issues. RTC2 weeks for PCE#1 (11/12/18)     CA 125:   4.8    11/12/2018:   C1 rubraca 600 mg po BID. RTC 2 weeks for labs (CBC, CMP, 4 weeks for PCE #2). Pt plans to go to [place] and may leave before PCE and re-est care at [facility]  (Dr. Gerilyn Pilgrim). Referral to ENT placed (pt cancelled appt for 11/22) and FNA was prev ordered for thyroid nodule- but has not been done.      CA 125: 5.5    11/18/2018:   Prob visit for symptoms from rubraca:For supportive meds, discussed recommendation for nausea control to include Zofran three times a day, 30 min before morning dose of PARP inhibitor, and 30 min before meals and Pepcid complete 2 x a day or prilosec. For insomnia, benadryl at night and see if it improves fatigue during the day.  Of note, she is not interested in future if needed to try Ritalin. Pt to call in 1 week to review symptoms    11/26/18:   Pt calls to report she is doing better on above meds. Only complaint is sleeping too much. Advised she can cut the benadryl dose in half.     12/03/2018:   Pt self d/c'd rubraca due to side effects (decreased energy mainly, some nausea)    12/10/2018:   Pt desires to start again on lower dose- rx to speciality pharmacy for rubraca 500 mg po BID. Pt headed to [facility]- explained she needs to have labs checked q 2 weeks initially and to be followed by oncologist (Dr. Bayard Males, [facility]) fax [phone]    02/2019:   Pt stops rubraca due to side effects, dose has been reduced per pt    03/02/2019:   CTAP: ([facility] [place]): NED. Partially calcified right thyroid nodule and hypodense 4 mm thyroid nodule    06/13/2019:   CA 125: 5. RTC 3 mos for CA 125, exam. Pt to go back to [place] in 09/2019. Recommended follow up for thyroid nodule    09/15/2019:   CA 125: 6.1. Pt to go back to [place] in 09/2019.  Recommended follow up for thyroid nodule. Sent letter to Dr. Gerilyn Pilgrim in [place] fax [phone]    05/31/2020:   CA 125: 5.9    08/16/2020:   CA 125: 6.2, NED    11/15/2020:   CA 125: 5.9    06/24/2021:   CA 125: 6,. NED, plans to move to [place]    08/21/2022:   CA 125: 8.5, NED, offered CTs as she has had some GI issues recently- resolved w omeprazole     08/25/2022:   PS Recurrence #2: CT AP: 2.6 cm lesion along the dome  of the right hepatic lobe may represent a peritoneal lesion along the undersurface of the diaphragm rather than a true hepatic parenchymal lesion. 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   Lymphatics: Previously demonstrated left para-aortic lymph node has resolved in the interim. No new or enlarging lymph nodes. Post surgical changes related to lymph node dissection.   Peritoneum/Retroperitoneum: Subtle thickening of the peritoneum in the paracolic gutters, left greater than right. Mild thickening of the peritoneum in the pelvis posteriorly.   Ct C: NED- Few scattered sub-4 mm pulmonary nodules are unchanged, likely benign, with no new suspicious or enlarging pulmonary nodules for example 2 mm right lower lobe pulmonary nodule (series 5, image 224). . Nodular density related to the right hemidiaphragm on image 244 is related to an infradiaphragmatic peritoneal implant.   Plan PET to eval for FDG activity    08/29/2022:   PET:  Two FDG avid foci at the superior surface of right hepatic lobe, favored to be peritoneal nodules along the undersurface of right hemidiaphragm, concerning for metastases.      ABDOMEN AND PELVIS: *  Two FDG avid foci are seen along the surface of the right liver dome with Max SUV 9.9 on axial image 112 and max SUV 5.1 on axial image 108. *  No additional abnormal liver uptake.     9/28.2023:   FU- pet c/w recurrence- offer carbo gem bev- start 10/10 at [facility], surg onc ref for port placement     09/15/2022:   scheduled for Power Port Insertion with Dr. Andrey Campanile    09/16/2022:   C1 carboplatin AUC4, gem 1000 mg/m2 (D1/8), Bev D1 q 21 days. 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 Neulasta D9 (started C2), RTC 3 weeks 11/30 C3  CA 125: 7.9    11/12-13/2023: [facility] admisison- Extensive LUE/shoulder/neck DVTPatient was admitted as above. She was started on a heparin drip and transitioned to eliquis on HD1. Vascular surgery was consulted with no further recommendations. Surgical Oncology was consulted to assess for need for port removal. Her port was accessed without difficulty and noted to be functioning well, therefore removal was not indicated. She will continue eliquis 10 mg BID for 7 days (11/13-11/19) followed by eliquis 5mg  BID for 6 months. Per surgical oncology, she only needs repeat imaging if the swelling gets worse while she is on [facility] or if  the port becomes non-functional. There is no particular need for re-imaging in the next week, but may consider in the time frame of several weeks to months if there is no issues.    11/06/2022:   C3 carboplatin AUC4, gem 1000 mg/m2 (D1/8), Bev D1 q 21 days. 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 Neulasta D9 (started C2), LUE/neck DVTs 10/2022- on eliquis. RTC 12/14 early PCE w CTs prior, inf on 12/21,  CA 125: 11.1    11/17/2022:   CTAP: Interval decrease in size of the peritoneal nodule in the dome of the right hepatic lobe. No evidence of new metastatic disease. he lesion along the dome of the right hepatic lobe is not demonstrable on axial images. On sagittal images, it appears much smaller, having the appearance of a peritoneal nodule under the diaphragmatic surface, measuring 1.6 x 0.6 cm (series 602, image 41), previously 1.9 x 1.5 cm.   CTC: NED    11/20/2022:   Early PCE, review CTs- PR after C3, C4 12/21, RTC 1/11 C5 CB- doing ok- had some nose bleeds last week, better now. Recommend nasal saline- she is on DOAC, bev- so higher risk for bleeding. Had diarrhea after chinese, getting better     11/21/2022:   Pt called in feeling  poorly - WBC 16, plts 20s, hgb 7s- admit for blood, infectious work up    11/27/2022:   Plan C4 carboplatin AUC4, gem 1000 mg/m2 (D1/8), Bev D1 q 21 days. 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 Neulasta D9 (started C2), LUE/neck DVTs 10/2022- on eliquis.   CA 125      Disposition: chemo, imaging after 3 cucles, CA 125, possible parp maintenance after, genetic testing neg, foundation one testing results from [facility] in [place]- this was not performed   Current disease status: Neg for CS mutation, no VUS, lifetime remaining breast cancer risk -3.4%  Genetics: negative for mutations, breast cancer lifetime risk 3.4%  Survivorship plan: pending completion of treatment              Past Medical History:   Diagnosis Date    Bowel obstruction (CMS-HCC) 2018    Ovarian cancer (CMS-HCC) 2014    Thyroid disease        Past Surgical History:   Procedure Laterality Date    ABDOMINOPLASTY  1999    BLEPHAROPLASTY  2000    DILATION AND CURETTAGE OF UTERUS  1979    ELBOW SURGERY  1990    EYE SURGERY  1994    INSERTION CATHETER VASCULAR ACCESS N/A 09/15/2022    Procedure: INSERTION POWER PORT A CATH;  Surgeon: Garrel Ridgel, MD;  Location: [facility] OR;  Service: General;  Laterality: N/A;  Left Subclavian    KNEE ARTHROPLASTY Right     ROTATOR CUFF REPAIR      TRANSUMBILICAL AUGMENTATION MAMMAPLASTY      VAGINAL HYSTERECTOMY  1979       Social History     Socioeconomic History    Marital status: Married     Spouse name: Not on file    Number of children: Not on file    Years of education: Not on file    Highest education level: Not on file   Occupational History    Not on file   Tobacco Use    Smoking status: Some Days     Packs/day: 0     Types: Cigarettes    Smokeless tobacco: Never    Tobacco  comments:     Smokes 1 pack every three days    Vaping Use    Vaping Use: Never used   Substance and Sexual Activity    Alcohol use: Yes     Alcohol/week: 14.0 standard drinks of alcohol     Types: 14 Shots of liquor per week    Drug use: No     Sexual activity: Not Currently   Other Topics Concern    Caffeine Use Yes    Occupational Exposure No    Exercise Yes    Seat Belt Yes   Social History Narrative    Mammogram-6 years ago and it was normal. Pt stated she isn't getting them anymore.      Colonoscopy-10 years ago        Pt reports she can lie flat in bed without SOB.    Pt states she can walk a flight of stairs and city block without chest pain and SOB>       Social Determinants of Health     Financial Resource Strain: Not on file   Food Insecurity: No Food Insecurity (11/21/2022)    Hunger Vital Sign     Worried About Running Out of Food in the Last Year: Never true     Ran Out of Food in the Last Year: Never true   Transportation Needs: No Transportation Needs (11/21/2022)    PRAPARE - Therapist, art (Medical): No     Lack of Transportation (Non-Medical): No   Physical Activity: Not on file   Stress: Not on file   Social Connections: Not on file   Intimate Partner Violence: Not At Risk (11/21/2022)    Humiliation, Afraid, Rape, and Kick questionnaire     Fear of Current or Ex-Partner: No     Emotionally Abused: No     Physically Abused: No     Sexually Abused: No   Housing Stability: Low Risk  (11/21/2022)    Housing Stability Vital Sign     Unable to Pay for Housing in the Last Year: No     Number of Places Lived in the Last Year: 1     Unstable Housing in the Last Year: No       No family history on file.    Current Facility-Administered Medications   Medication Dose Frequency Provider Last Admin    heparin (porcine)          lactated Ringers  125 mL/hr Continuous Garnet Sierras, MD 125 mL/hr at 11/21/22 1719    levothyroxine  75 mcg QAM AC Garnet Sierras, MD 75 mcg at 11/22/22 0746    lisinopriL  10 mg Daily 0900 Garnet Sierras, MD 10 mg at 11/22/22 0746    sodium chloride 0.9 %  20 mL/hr Continuous Kearney Hard, MD         Allergies   Allergen Reactions    Nsaids (Non-Steroidal Anti-Inflammatory Drug)      Stomach ache     Sulfamethoxazole-Trimethoprim Other (See Comments)     Hallucinations, anxiety  Other reaction(s): Unknown  loopy  hallucinations    Celecoxib Nausea And Vomiting    Metoclopramide Other (See Comments)     Does not remember       O:  Vitals:    11/21/22 2230 11/21/22 2306 11/22/22 0346 11/22/22 0828   BP: 128/80 126/80 132/76 140/81   BP Location:   Right upper arm Right upper arm   Patient Position:   Lying  Sitting   Pulse: 76 75 68 65   Resp: 16 16 16 17    Temp: 97.9 F (36.6 C) 97.9 F (36.6 C) 97.9 F (36.6 C) 97.7 F (36.5 C)   TempSrc: Oral Oral Oral Oral   SpO2: 100% 99% 99% 100%   Weight:       Height:         Gen: NAD  CV: well perfused  Resp: moving air well, no respiratory distress  Abd: soft, non-tender, non-distended  Ext: strong peripheral pulses, no edema    Labs:  Lab Results   Component Value Date    WBC 13.8 (H) 11/22/2022    HGB 7.8 (L) 11/22/2022    HCT 22.5 (L) 11/22/2022    MCV 98.2 11/22/2022    PLT 23 (L) 11/22/2022     Lab Results   Component Value Date    CREATININE 0.51 (L) 11/22/2022    BUN 12 11/22/2022    NA 138 11/22/2022    K 4.5 11/22/2022    CL 104 11/22/2022    CO2 26 11/22/2022       Imaging:  11/06/22 CT Chest with IV Contrast  No Evidence of Intrathoracic Metastatic Disease    11/06/22 CTAP w/IV Contrast  Interval decrease in size of the peritoneal nodule in the dome of the right hepatic lobe. No evidence of new metastatic disease.    Cultures:  Blood Cx - Pending  Urine Cx - Pending    Pathology:  N/A    [name]: Kelsey Sullivan is 81 y.o. 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 This patient is s/p C6 Carbo/Taxol, C1 Rubraca, now s/p C3 Day 16 Carbo/Gem/Bev, and admitted for workup of anemia and leukocytosis.    Cardiovascular  HTN  - Lisinopril 10mg  daily, reordered inpatient  - BP normotensive this  admission    Anemia  - 12/15 Hgb 7.1 -> 1u pRBC (12/15) -> 7.6 -> 7.8 (12/16 AM)  - no signs of ongoing bleeding, patient denies melena or hematuria or vaginal bleeding  - will collect stool guaiac to confirm no source of melena  - will transfuse 1u pRBC, ordered    Thrombocytopenia  - 12/15 Plt 29 -> 22 (12/16) -> 23 (12/16 AM)   - will hold Eliquis at this time  - will continue to monitor, if plt < 20, will transfuse    Respiratory  - NAI    FEN/GI/Renal   - NAI  - Replace electrolytes PRN to maintain K>4, Phos >3, Mag >2    ID  Leukocytosis, improving  - 12/15 WBC 16.8, ANC 16109 -> 15.4 -> 13.8 (11/22/22)  - afebrile on admission, continues to be afebrile  - Blood Cx NGTD, prelim result  - UA wnl 11/21/22  - Urine Cx pending  - CXR wnl 11/21/22  - Respiratory Virus Panel neg 11/21/22    Heme/Onc  Stage IIIC High Grade Serous Ovarian Adenocarcinoma  - 12/28/2012 - BSO, Omentectomy, LND. Diagnostic of serous ovarian adenocarcinoma. Omentectomy with metastatic adenocarcinoma. LND neg.   - 01/08/2013 - Refused chemotherapy and did alternative tx with raw diet for 1 year  - 04/06/2017 - CT [name] mild SBO  - 01/29/2018 - CT [name] interval development of necrotic retroperitoneal lymphadenopathy with largest node  - 02/18/2018 CA125: 197.8  - 02/26/2018 CT guided  left retroperitoneal node biopsy w/focally involved by non-small cell malignancy- additional stains are diagnostic of non-small cell carcionoma and supportive of involve57ment by the patients reported previous known ovarian malignancy.  - 03/04/2018 FDG avid retroperitoneal lymphadenopathy as described above and an FDG avid lesion over the dome of the liver most likely a peritoneal implant rather than a liver metastasis.   - 04/12/2018 IR port placed  - 06/04/2018 s/p C3 Carbo/Taxol  - 06/25/2018 held chemo due to low anc (1047)   - 08/27/2018 s/p C6 Carbo/Taxol  - 09/20/2018 Port Removed  - 09/22/2018 CT CAP without evidence of thoracic metastatic disease and  interval decrease in size of a left periaortic necrotic lymph node   - 10/15/2018 PET scan, resolution of previous hypermetabolic para-aortic lymph nodes and hepatic dome lesions. Hypermetabolic focus in left thyroid lobe  - 10/19/2018 Neck US with  1.2 cm irregular hypoechoic nodule in the left thyroid lobe is suspicious  - 11/12/2018: C1 rubraca  - 03/02/2019 CT Scan from [facility], no evidence for recurrent or new neoplasm. Increased colonic stool could indicate constipation. Postoperative changes  - 06/13/2019-02/06/2021, Stable CA125 (< 6.5)  - 08/25/22 CT a/p w/ 2.6 cm lesion along the dome of the right hepatic lobe may represent a peritoneal lesion along the undersurface of the diaphragm rather than a true hepatic parenchymal lesion. 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   - 08/29/22 PET with Two FDG avid foci at the superior surface of right hepatic lobe, favored to be peritoneal nodules along the undersurface of right hemidiaphragm, concerning for metastases.  - 09/15/22 Port insertion  - 11/06/22 s/p C3 Carbo/Gem/Bev  - Planned C4 11/27/22    LUE/neck DVTs  - noted to have LUE/neck DVTs 10/2022   - 1.  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 Additional thrombus of the left internal and external jugular veins to the level of the carotid bifurcation. There is collateral flow in the left neck through the anterior jugular vein.   2.  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   - currently on eliquis 5mg  BID x 6 months, held inpatient in setting of thrombocytopenia    Endocrine  Thyroid Disease  - 10/15/2018 PET scan, resolution of previous hypermetabolic para-aortic lymph nodes and hepatic dome lesions. Hypermetabolic focus in left thyroid lobe  - 10/19/2018 Neck US with  1.2 cm irregular  hypoechoic nodule in the left thyroid lobe is suspicious  - 03/02/2019: CTAP: NED. Partially calcified right thyroid nodule and hypodense 4 mm thyroid nodule  - TSH 1.66 (10/21/18) -> 12.01 (05/31/20) -> 5.91 (08/16/20) -> 2.62 (11/15/20)    Neuro  - NAI    MSK  - NAI    Prophylaxis  DVT  - SCD's  - eliquis 5mg  BID for LUE/neck DVTs, held currently    Dispo: inpatient    Garnet Sierras, MD PGY-2  Obstetrics and Gynecology Resident

## 2022-11-22 NOTE — Unmapped (Signed)
Problem: Acute Pain  Description: Patient's pain progressing toward patient's stated pain goal  Goal: Patient displays improved well-being such as baseline levels for pulse, BP, respirations and relaxed muscle tone or body posture  Outcome: Progressing  Goal: Patient will manage pain with the appropriate technique/intervention  Description: Assess and monitor patient's pain using appropriate pain scale. Collaborate with interdisciplinary team and initiate plan and interventions as ordered.  Re-assess patient's pain level 30-60 minutes after pain management intervention.  Outcome: Progressing

## 2022-11-23 LAB — BASIC METABOLIC PANEL
Anion Gap: 7 mmol/L (ref 3–16)
BUN: 10 mg/dL (ref 7–25)
CO2: 27 mmol/L (ref 21–33)
Calcium: 8.9 mg/dL (ref 8.6–10.3)
Chloride: 105 mmol/L (ref 98–110)
Creatinine: 0.56 mg/dL — ABNORMAL LOW (ref 0.60–1.30)
EGFR: >90
Glucose: 94 mg/dL (ref 70–100)
Osmolality, Calculated: 287 mosm/kg (ref 278–305)
Potassium: 4.3 mmol/L (ref 3.5–5.3)
Sodium: 139 mmol/L (ref 133–146)

## 2022-11-23 LAB — CBC
Hematocrit: 26.2 % (ref 35.0–45.0)
Hemoglobin: 9.1 g/dL (ref 11.7–15.5)
MCH: 34.2 pg (ref 27.0–33.0)
MCHC: 34.6 g/dL (ref 32.0–36.0)
MCV: 98.6 fL (ref 80.0–100.0)
MPV: 9.9 fL (ref 7.5–11.5)
Platelets: 26 10*3/uL (ref 140–400)
RBC: 2.66 10*6/uL (ref 3.80–5.10)
RDW: 15.6 % (ref 11.0–15.0)
WBC: 13.9 10*3/uL (ref 3.8–10.8)

## 2022-11-23 LAB — MAGNESIUM: Magnesium: 1.5 mg/dL (ref 1.5–2.5)

## 2022-11-23 LAB — PHOSPHORUS: Phosphorus: 3.9 mg/dL (ref 2.1–4.5)

## 2022-11-23 MED ORDER — magnesium sulfate in sterile water 100 mL IVPB 4 g
4 | Freq: Once | INTRAVENOUS | Status: AC
Start: 2022-11-23 — End: 2022-11-23
  Administered 2022-11-23: 14:00:00 4 g via INTRAVENOUS

## 2022-11-23 MED FILL — CETIRIZINE 10 MG TABLET: 10 10 MG | ORAL | Qty: 1

## 2022-11-23 MED FILL — MAGNESIUM SULFATE 4 GRAM/100 ML (4 %) IN WATER INTRAVENOUS PIGGYBACK: 4 4 gram/100 mL (4 %) | INTRAVENOUS | Qty: 100

## 2022-11-23 MED FILL — LISINOPRIL 5 MG TABLET: 5 5 MG | ORAL | Qty: 2

## 2022-11-23 MED FILL — LEVOTHYROXINE 75 MCG TABLET: 75 75 MCG | ORAL | Qty: 1

## 2022-11-23 NOTE — Unmapped (Signed)
Gynecology Oncology Progress Note    Chief complaint: No chief complaint on file.    HPI: Kelsey Sullivan is 81 y.o. 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 This patient is s/p C6 Carbo/Taxol, C1 Rubraca, and now s/p C3 Day 17 Carbo/Gem/Bev.     She presents to [facility] as a direct admit in the setting of anemia and leukocytosis. Patient reports calling in yesterday due to fatigue and episodes of epistaxis. She was then told to get lab work that demonstrated anemia and leukocytosis and recommended to present for direct admission.    24hr events:  No events overnight    Subjective:  Patient reports doing well overnight. Reports that she would like to see the 11/17/22 CT chest read to assess thromboses after starting anticoagulation. Interested in when she can restart eliquis. Tolerating regular diet without N/V, passing gas, voiding spontaneously.      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 Pt declined adjuvant therapy      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       L ovary and fallopian tube- serous adenocarcinoma, high grade, 4 cm, favor  metastasis.     Posterior cul de sac biopsy-metastatic adenocarcinoma.     Omentectomy- metastatic adenocarcinoma     LND-negative     Residual right IP ligament, Anterior cul de sac biopsy, right pelvic side will biopsy, left pelvic side wall, right gutter and left gutter-->benign    04/06/2017:   CT [name]- mild small bowel obstruction with transition point in the left pelvis adjacent to the anterior abdominal wall. Low density in the common femoral veins bilaterally thought likely to represent flow artificat, may consider follow up with lomer extremity Doppler to ensure there is no underlying deep venous thrombosis.    01/29/2018:   CTAP:  Interval development of necrotic retroperitoneal lymphadenopathy with largest node measuring 4.9  4.2 x 4.8 cm. Origin is indeterminate.  Options would include CT directed biopsy as well as PET/CT.    02/18/2018:   CA 125:  197.8    02/26/2018:   CT guided Left retroperitoneal node biopsy- focally involved by non-small cell malignancy- additional stains are diagnostic of non-small cell carcionoma and supportive of involve1ment by the patients reported previous known ovarian malignancy.    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  No other focal areas of FD avid malignancy are found. Specifically, no lymph node  activity is found outside of the retroperitoneum and there is no evidence for FDG avid pulmonary, skeletal or hepatic parenchymal metastasis.    02/2018:   Patient saw Dr. Ishmael Holter who recommended Carbo/Taxol with an AUC of 6.  Dr. Maren Reamer office submitted foundation one testing. Patient would like Genetic Testing    04/12/2018:   IR port placed    04/16/2018:   C1 weekly taxol 80 mg/m2 D1,8,15, Carboplatin AUC 6 D1 q 21 days. PCEs on thurs, txt on fridays. EXAMS ON EVEN CYCLES     CA 125: 523.4     Genetic testing: Neg for CS  mutation, no VUS, lifetime remaining breast cancer risk -3.4%    05/07/2018:   C2 weekly taxol 80 mg/m2 D1,8,15, Carboplatin AUC 6 D1 q 21 days. PCEs on thurs, txt on fridays. EXAMS ON EVEN CYCLES     CA 125: 162.2    05/20/2018:   Early PCE due to scheduling, due 6/21    05/28/2018  Delay C3 due to low ANC     CA 125: 25    06/04/2018  C3 weekly taxol 80 mg/m2 D1,8,15, Carboplatin AUC 6 D1 q 21 days. EXAMS ON EVEN CYCLES.      CA 125    06/25/2018:   HOLD chemo due to low anc (1047)- defer to next week, and change to q 28 days cycle to build in an off week to allow for counts to recover     CA 125: 10.6    07/02/2018:   C4 weekly taxol 80 mg/m2 D1,8,15, (missed day 5 due to admission) Carboplatin AUC 6 D1 q 28 days. ANC still lowish 1480s- neulasta Day 16      EXAMS ON EVEN CYCLES.  Review her genetics testing results      CA 125 7.2    07/07/2018:   [facility] admission: SBO, neutropenic fever     CTAP:Small bowel obstruction with transition point in the anterior mid abdomen near the midline. Small amount of free fluid in the abdomen, which is nonspecific. 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     Peritoneum: Small amount of free fluid in the abdomen. A para-aortic density with central low attenuation measures 2.5 x 2 cm and is significantly decreased in size from prior.    07/16/2018:   CA 125: 28.5    07/30/2018:  C5 weekly taxol 80 mg/m2 D1,8,15, Carboplatin AUC 6 D1 q 28 days. Neulasta Day 16      EXAMS ON EVEN CYCLES.      CA 125: 11.4    08/27/2018:   C6 weekly taxol 80 mg/m2 D1,8,15, Carboplatin AUC 6 D1 q 28 days. Neulasta Day 16. Order Ct CAP. RTC 10/11 (3 weeks)     EXAMS ON EVEN CYCLES. Discussed option of maintenance parp I- pt not decided (doesn't want to feel tired anymore)     CA 125: 8.7    09/20/2018:   Port removed: While the skin surface appeared inflamed, there was no evidence of infection in the port reservoir.    09/22/2018:   CT  Chest: No evidence of thoracic metastatic disease.     Suspected small seroma in the base of the right neck.  Necrotic metastatic lesion could also have this appearance but considered less likely.     CtAP: Interval decrease in size of a left periaortic necrotic lymph node. No evidence of new metastatic disease.     Lymphatics: Previously  demonstrated left para-aortic lymph node with central necrosis currently measures 1.2 cm on series 5 image 45, previously measured 1.8 cm in the short axis.    10/05/2018:   Reviewed scans- aortic node has decreased in size uncertain if active cancer or just scarring. Check PET (to investigate of node is hypermetabolic as well as assess for liver implant previously noted on pet. Offered maintenance with parp I- rubraca 600 mg BID. Pt interested-order placed thru spec pharmacy. RTC BC in a few weeks to start drug and review PET     10/15/2018:   PET: Resolution of the previous hypermetabolic para-aortic lymph nodes and hepatic dome lesions. Hypermetabolic focus within the left thyroid lobe.  Consider further evaluation with ultrasound, if clinically indicated.     NECK: There is a focus of uptake within the left thyroid lobe (axial image 62), with max SUV of 3.3    10/20/2018:   Thyroid US: 1.2 cm irregular hypoechoic nodule in the left thyroid lobe is suspicious. Recommend FNA for further evaluation.     1.0 cm nodule right thyroid lobe containing macrocalcification is indeterminate. Recommend follow-up thyroid ultrasound in one year to evaluate for stability.     Recommend= FNA for suspicious left thyroid nodule     10/21/2018:   Order thyroid FNA. Pt has not gotten her rubraca yet due to financial issues. RTC2 weeks for PCE#1 (11/12/18)     CA 125:   4.8    11/12/2018:   C1 rubraca 600 mg po BID. RTC 2 weeks for labs (CBC, CMP, 4 weeks for PCE #2). Pt plans to go to [place] and may leave before PCE and re-est care at [facility] (Dr. Gerilyn Pilgrim). Referral to ENT placed (pt cancelled appt for  11/22) and FNA was prev ordered for thyroid nodule- but has not been done.      CA 125: 5.5    11/18/2018:   Prob visit for symptoms from rubraca:For supportive meds, discussed recommendation for nausea control to include Zofran three times a day, 30 min before morning dose of PARP inhibitor, and 30 min before meals and Pepcid complete 2 x a day or prilosec. For insomnia, benadryl at night and see if it improves fatigue during the day.  Of note, she is not interested in future if needed to try Ritalin. Pt to call in 1 week to review symptoms    11/26/18:   Pt calls to report she is doing better on above meds. Only complaint is sleeping too much. Advised she can cut the benadryl dose in half.     12/03/2018:   Pt self d/c'd rubraca due to side effects (decreased energy mainly, some nausea)    12/10/2018:   Pt desires to start again on lower dose- rx to speciality pharmacy for rubraca 500 mg po BID. Pt headed to [facility]- explained she needs to have labs checked q 2 weeks initially and to be followed by oncologist (Dr. Bayard Males, [facility]) fax [phone]    02/2019:   Pt stops rubraca due to side effects, dose has been reduced per pt    03/02/2019:   CTAP: ([facility] [place]): NED. Partially calcified right thyroid nodule and hypodense 4 mm thyroid nodule    06/13/2019:   CA 125: 5. RTC 3 mos for CA 125, exam. Pt to go back to [place] in 09/2019. Recommended follow up for thyroid nodule    09/15/2019:   CA 125: 6.1. Pt to go back to [place] in 09/2019. Recommended follow  up for thyroid nodule. Sent letter to Dr. Gerilyn Pilgrim in [place] fax [phone]    05/31/2020:   CA 125: 5.9    08/16/2020:   CA 125: 6.2, NED    11/15/2020:   CA 125: 5.9    06/24/2021:   CA 125: 6,. NED, plans to move to [place]    08/21/2022:   CA 125: 8.5, NED, offered CTs as she has had some GI issues recently- resolved w omeprazole     08/25/2022:   PS Recurrence #2: CT AP: 2.6 cm lesion along the dome of the right hepatic lobe may represent a peritoneal lesion  along the undersurface of the diaphragm rather than a true hepatic parenchymal lesion. 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   Lymphatics: Previously demonstrated left para-aortic lymph node has resolved in the interim. No new or enlarging lymph nodes. Post surgical changes related to lymph node dissection.   Peritoneum/Retroperitoneum: Subtle thickening of the peritoneum in the paracolic gutters, left greater than right. Mild thickening of the peritoneum in the pelvis posteriorly.   Ct C: NED- Few scattered sub-4 mm pulmonary nodules are unchanged, likely benign, with no new suspicious or enlarging pulmonary nodules for example 2 mm right lower lobe pulmonary nodule (series 5, image 224). . Nodular density related to the right hemidiaphragm on image 244 is related to an infradiaphragmatic peritoneal implant.   Plan PET to eval for FDG activity    08/29/2022:   PET:  Two FDG avid foci at the superior surface of right hepatic lobe, favored to be peritoneal nodules along the undersurface of right hemidiaphragm, concerning for metastases.      ABDOMEN AND PELVIS: *  Two FDG avid foci are seen along the surface of the right liver dome with Max SUV 9.9 on axial image 112 and max SUV 5.1 on axial image 108. *  No additional abnormal liver uptake.     9/28.2023:   FU- pet c/w recurrence- offer carbo gem bev- start 10/10 at [facility], surg onc ref for port placement     09/15/2022:   scheduled for Power Port Insertion with Dr. Andrey Campanile    09/16/2022:   C1 carboplatin AUC4, gem 1000 mg/m2 (D1/8), Bev D1 q 21 days. 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 Neulasta D9 (started C2), RTC 3 weeks 11/30 C3  CA 125: 7.9    11/12-13/2023: UC admisison- Extensive LUE/shoulder/neck DVTPatient was admitted as above. She was started on a heparin drip and transitioned to eliquis on HD1. Vascular surgery was consulted with no further recommendations. Surgical Oncology was consulted to assess for need for port removal. Her port was accessed without difficulty and noted to be functioning well, therefore removal was not indicated. She will continue eliquis 10 mg BID for 7 days (11/13-11/19) followed by eliquis 5mg  BID for 6 months. Per surgical oncology, she only needs repeat imaging if the swelling gets worse while she is on [facility] or if the port  becomes non-functional. There is no particular need for re-imaging in the next week, but may consider in the time frame of several weeks to months if there is no issues.    11/06/2022:   C3 carboplatin AUC4, gem 1000 mg/m2 (D1/8), Bev D1 q 21 days. 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 Neulasta D9 (started C2), LUE/neck DVTs 10/2022- on eliquis. RTC 12/14 early PCE w CTs prior, inf on 12/21,  CA 125: 11.1    11/17/2022:   CTAP: Interval decrease in size of the peritoneal nodule in the dome of the right hepatic lobe. No evidence of new metastatic disease. he lesion along the dome of the right hepatic lobe is not demonstrable on axial images. On sagittal images, it appears much smaller, having the appearance of a peritoneal nodule under the diaphragmatic surface, measuring 1.6 x 0.6 cm (series 602, image 41), previously 1.9 x 1.5 cm.   CTC: NED    11/20/2022:   Early PCE, review CTs- PR after C3, C4 12/21, RTC 1/11 C5 CB- doing ok- had some nose bleeds last week, better now. Recommend nasal saline- she is on DOAC, bev- so higher risk for bleeding. Had diarrhea after chinese, getting better     11/21/2022:   Pt called in feeling poorly - WBC 16, plts 20s, hgb 7s- admit for blood,  infectious work up    11/27/2022:   Plan C4 carboplatin AUC4, gem 1000 mg/m2 (D1/8), Bev D1 q 21 days. 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 Neulasta D9 (started C2), LUE/neck DVTs 10/2022- on eliquis.   CA 125      Disposition: chemo, imaging after 3 cucles, CA 125, possible parp maintenance after, genetic testing neg, foundation one testing results from [facility] in [place]- this was not performed   Current disease status: Neg for CS mutation, no VUS, lifetime remaining breast cancer risk -3.4%  Genetics: negative for mutations, breast cancer lifetime risk 3.4%  Survivorship plan: pending completion of treatment              Past Medical History:   Diagnosis Date    Bowel obstruction (CMS-HCC) 2018    Ovarian cancer (CMS-HCC) 2014    Thyroid disease        Past Surgical History:   Procedure Laterality Date    ABDOMINOPLASTY  1999    BLEPHAROPLASTY  2000    DILATION AND CURETTAGE OF UTERUS  1979    ELBOW SURGERY  1990    EYE SURGERY  1994    INSERTION CATHETER VASCULAR ACCESS N/A 09/15/2022    Procedure: INSERTION POWER PORT A CATH;  Surgeon: Garrel Ridgel, MD;  Location: [facility] OR;  Service: General;  Laterality: N/A;  Left Subclavian    KNEE ARTHROPLASTY Right     ROTATOR CUFF REPAIR      TRANSUMBILICAL AUGMENTATION MAMMAPLASTY      VAGINAL HYSTERECTOMY  1979       Social History     Socioeconomic History    Marital status: Married     Spouse name: Not on file    Number of children: Not on file    Years of education: Not on file    Highest education level: Not on file   Occupational History    Not on file   Tobacco Use    Smoking status: Some Days     Packs/day: 0     Types: Cigarettes    Smokeless tobacco: Never    Tobacco comments:  Smokes 1 pack every three days    Vaping Use    Vaping Use: Never used   Substance and Sexual Activity    Alcohol use: Yes     Alcohol/week: 14.0 standard drinks of alcohol     Types: 14 Shots of liquor per week    Drug use: No    Sexual activity: Not Currently   Other Topics  Concern    Caffeine Use Yes    Occupational Exposure No    Exercise Yes    Seat Belt Yes   Social History Narrative    Mammogram-6 years ago and it was normal. Pt stated she isn't getting them anymore.      Colonoscopy-10 years ago        Pt reports she can lie flat in bed without SOB.    Pt states she can walk a flight of stairs and city block without chest pain and SOB>       Social Determinants of Health     Financial Resource Strain: Not on file   Food Insecurity: No Food Insecurity (11/21/2022)    Hunger Vital Sign     Worried About Running Out of Food in the Last Year: Never true     Ran Out of Food in the Last Year: Never true   Transportation Needs: No Transportation Needs (11/21/2022)    PRAPARE - Therapist, art (Medical): No     Lack of Transportation (Non-Medical): No   Physical Activity: Not on file   Stress: Not on file   Social Connections: Not on file   Intimate Partner Violence: Not At Risk (11/21/2022)    Humiliation, Afraid, Rape, and Kick questionnaire     Fear of Current or Ex-Partner: No     Emotionally Abused: No     Physically Abused: No     Sexually Abused: No   Housing Stability: Low Risk  (11/21/2022)    Housing Stability Vital Sign     Unable to Pay for Housing in the Last Year: No     Number of Places Lived in the Last Year: 1     Unstable Housing in the Last Year: No       No family history on file.    Current Facility-Administered Medications   Medication Dose Frequency Provider Last Admin    cetirizine  10 mg Daily 0900 Kearney Hard, MD 10 mg at 11/22/22 1115    levothyroxine  75 mcg QAM AC Garnet Sierras, MD 75 mcg at 11/22/22 0746    lisinopriL  10 mg Daily 0900 Garnet Sierras, MD 10 mg at 11/22/22 0746    sodium chloride  1 spray PRN Kearney Hard, MD         Allergies   Allergen Reactions    Nsaids (Non-Steroidal Anti-Inflammatory Drug)      Stomach ache    Sulfamethoxazole-Trimethoprim Other (See Comments)     Hallucinations, anxiety  Other reaction(s):  Unknown  loopy  hallucinations    Celecoxib Nausea And Vomiting    Metoclopramide Other (See Comments)     Does not remember       O:  Vitals:    11/22/22 1551 11/22/22 2004 11/22/22 2311 11/23/22 0403   BP: 154/81 152/80 145/85 166/85   BP Location: Right upper arm Right upper arm Right upper arm Right upper arm   Patient Position: Sitting Lying Lying Lying   BP Cuff Size:       Pulse: 66  79 72 65   Resp: 17 16 16 16    Temp: 98.6 F (37 C) 98.4 F (36.9 C) 98.1 F (36.7 C) 98.4 F (36.9 C)   TempSrc: Oral Oral Oral Oral   SpO2: 98% 97% 97% 98%   Weight:       Height:         Gen: NAD  CV: well perfused  Resp: moving air well, no respiratory distress  Abd: soft, non-tender, non-distended  Ext: strong peripheral pulses, no edema    Labs:  Lab Results   Component Value Date    WBC 13.9 (H) 11/23/2022    HGB 9.1 (L) 11/23/2022    HCT 26.2 (L) 11/23/2022    MCV 98.6 11/23/2022    PLT 26 (L) 11/23/2022     Lab Results   Component Value Date    CREATININE 0.56 (L) 11/23/2022    BUN 10 11/23/2022    NA 139 11/23/2022    K 4.3 11/23/2022    CL 105 11/23/2022    CO2 27 11/23/2022       Imaging:  11/06/22 CT Chest with IV Contrast  No Evidence of Intrathoracic Metastatic Disease    11/06/22 CTAP w/IV Contrast  Interval decrease in size of the peritoneal nodule in the dome of the right hepatic lobe. No evidence of new metastatic disease    X-ray Portable Chest    Result Date: 11/21/2022  IMPRESSION: No acute cardiopulmonary abnormality. Report Verified by: Baird Cancer, MD at 11/21/2022 5:12 PM EST    CT Chest With IV contrast    Result Date: 11/17/2022  IMPRESSION: No evidence of intrathoracic metastatic disease. Report Verified by: Odie Sera, MD at 11/17/2022 11:56 AM EST    CT Abdomen and Pelvis With IV contrast    Result Date: 11/17/2022  IMPRESSION: Interval decrease in size of the peritoneal nodule in the dome of the right hepatic lobe. No evidence of new metastatic disease. Report Verified by: Wilford Sports, MD at  11/17/2022 11:46 AM EST     Cultures:  Blood Cx 12/15 NGTD  Urine Cx 12/15 Pending    Pathology:  N/A    [name]: Kelsey Sullivan is 81 y.o. 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 This patient is s/p C6 Carbo/Taxol, C1 Rubraca, now s/p C3 Day 16 Carbo/Gem/Bev, and admitted for workup of anemia and leukocytosis.    Cardiovascular  HTN  - Lisinopril 10mg  daily, reordered inpatient  - BP normotensive this admission    Anemia  - 12/15 Hgb 7.1 -> 1u pRBC (12/15) -> 7.6 -> 7.8 (12/16 AM) -> 1u (12/16) -> 8.8 -> 9.1  - no signs of ongoing bleeding, patient denies melena or hematuria or vaginal bleeding  - will not collect stool guiaic due to recent episodes of epistaxis and likely patient swallowing blood    Thrombocytopenia  - 12/15 Plt 29 -> 22 (12/16) -> 23 (12/16 AM)   - will hold Eliquis at this time  - will continue to monitor, if plt < 20, will transfuse    Respiratory  - NAI    FEN/GI/Renal   - NAI  - Replace electrolytes PRN to maintain K>4, Phos >3, Mag >2    ID  Leukocytosis, improving  - 12/15 WBC 16.8, ANC 16109 -> 15.4 -> 13.8 (11/22/22)  - afebrile  on admission, continues to be afebrile  - Blood Cx NGTD, prelim result  - UA wnl 11/21/22  - Urine Cx pending  - CXR wnl 11/21/22  - Respiratory Virus Panel neg 11/21/22    Heme/Onc  Stage IIIC High Grade Serous Ovarian Adenocarcinoma  - 12/28/2012 - BSO, Omentectomy, LND. Diagnostic of serous ovarian adenocarcinoma. Omentectomy with metastatic adenocarcinoma. LND neg.   - 01/08/2013 - Refused chemotherapy and did alternative tx with raw diet for 1 year  - 04/06/2017 - CT [name] mild SBO  - 01/29/2018 - CT [name] interval development of necrotic retroperitoneal lymphadenopathy with largest node  - 02/18/2018 CA125: 197.8  - 02/26/2018 CT guided left retroperitoneal node biopsy w/focally involved by  non-small cell malignancy- additional stains are diagnostic of non-small cell carcionoma and supportive of involve18ment by the patients reported previous known ovarian malignancy.  - 03/04/2018 FDG avid retroperitoneal lymphadenopathy as described above and an FDG avid lesion over the dome of the liver most likely a peritoneal implant rather than a liver metastasis.   - 04/12/2018 IR port placed  - 06/04/2018 s/p C3 Carbo/Taxol  - 06/25/2018 held chemo due to low anc (1047)   - 08/27/2018 s/p C6 Carbo/Taxol  - 09/20/2018 Port Removed  - 09/22/2018 CT CAP without evidence of thoracic metastatic disease and interval decrease in size of a left periaortic necrotic lymph node   - 10/15/2018 PET scan, resolution of previous hypermetabolic para-aortic lymph nodes and hepatic dome lesions. Hypermetabolic focus in left thyroid lobe  - 10/19/2018 Neck US with  1.2 cm irregular hypoechoic nodule in the left thyroid lobe is suspicious  - 11/12/2018: C1 rubraca  - 03/02/2019 CT Scan from [facility], no evidence for recurrent or new neoplasm. Increased colonic stool could indicate constipation. Postoperative changes  - 06/13/2019-02/06/2021, Stable CA125 (< 6.5)  - 08/25/22 CT a/p w/ 2.6 cm lesion along the dome of the right hepatic lobe may represent a peritoneal lesion along the undersurface of the diaphragm rather than a true hepatic parenchymal lesion. 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   - 08/29/22 PET with Two FDG avid foci at the superior surface of right hepatic lobe, favored to be peritoneal nodules along the undersurface of right hemidiaphragm, concerning for metastases.  - 09/15/22 Port insertion  - 11/06/22 s/p C3 Carbo/Gem/Bev  - Planned C4 11/27/22    LUE/neck DVTs  - noted to have LUE/neck DVTs 10/2022   - 1.  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 Additional thrombus of the left internal and external jugular veins to the level of the carotid bifurcation. There is collateral flow in the left neck through the anterior jugular vein.   2.  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   - currently on eliquis 5mg  BID x 6 months, held inpatient in setting of thrombocytopenia  - 11/17/22 CT chest re-read, not completed with SVC protocol, contrast was injected through R extremity that was not perfused through L extremity   - would have to get CT chest SVC protocol or CT chest with contrast in L arm    Endocrine  Thyroid Disease  - 10/15/2018 PET scan, resolution of previous hypermetabolic para-aortic lymph nodes and hepatic dome lesions. Hypermetabolic focus in left thyroid lobe  - 10/19/2018 Neck US with  1.2 cm irregular hypoechoic nodule in the left  thyroid lobe is suspicious  - 03/02/2019: CTAP: NED. Partially calcified right thyroid nodule and hypodense 4 mm thyroid nodule  - TSH 1.66 (10/21/18) -> 12.01 (05/31/20) -> 5.91 (08/16/20) -> 2.62 (11/15/20)    Neuro  - NAI    MSK  - NAI    Prophylaxis  DVT  - SCD's  - eliquis 5mg  BID for LUE/neck DVTs, held currently, will consider restart today given Hgb stability    Dispo: inpatient    Garnet Sierras, MD PGY-2  Obstetrics and Gynecology Resident

## 2022-11-23 NOTE — Unmapped (Signed)
Problem: Acute Pain  Description: Patient's pain progressing toward patient's stated pain goal  Goal: Patient displays improved well-being such as baseline levels for pulse, BP, respirations and relaxed muscle tone or body posture  Outcome: Progressing  Goal: Patient will manage pain with the appropriate technique/intervention  Description: Assess and monitor patient's pain using appropriate pain scale. Collaborate with interdisciplinary team and initiate plan and interventions as ordered.  Re-assess patient's pain level 30-60 minutes after pain management intervention.  Outcome: Progressing  Goal: Patient will reduce or eliminate use of analgesics  Outcome: Progressing

## 2022-11-23 NOTE — Unmapped (Signed)
Problem: Acute Pain  Description: Patient's pain progressing toward patient's stated pain goal  Goal: Patient displays improved well-being such as baseline levels for pulse, BP, respirations and relaxed muscle tone or body posture  Outcome: Progressing  Goal: Patient will manage pain with the appropriate technique/intervention  Description: Assess and monitor patient's pain using appropriate pain scale. Collaborate with interdisciplinary team and initiate plan and interventions as ordered.  Re-assess patient's pain level 30-60 minutes after pain management intervention.  Outcome: Progressing

## 2022-11-23 NOTE — Unmapped (Addendum)
Patient seen on rounds with Dr. Maryruth Bun, attending physician.    Plt 26 today, mildly increased from 23 yesterday. Otherwise, patient has had no concerns.    Plan:  - mIVF d/ced, patient tolerating PO  - will keep inpatient for 1 more day for repeat CBC in AM  - will not start eliquis at this time, consider restarting eliquis tomorrow pending AM ANC  - discussed imaging at this time, most recent CT was performed without SVC protocol and in R circulation, was unable to image LUE and neck DVT clot burden   - discussed that monitoring clot burden would not change management, patient understanding    Garnet Sierras, MD  Obstetrics & Gynecology, PGY-2

## 2022-11-24 MED ORDER — magnesium oxide (MAG-OX) tablet 400 mg
400 | Freq: Three times a day (TID) | ORAL
Start: 2022-11-24 — End: 2022-11-24
  Administered 2022-11-24 (×2): 400 mg via ORAL

## 2022-11-24 MED ORDER — apixaban (ELIQUIS) 2.5 mg Tab
2.5 | ORAL_TABLET | Freq: Two times a day (BID) | ORAL | 0 refills
Start: 2022-11-24 — End: 2022-11-24

## 2022-11-24 MED ORDER — magnesium oxide (MAG-OX) 400 mg tablet
400 | ORAL_TABLET | Freq: Three times a day (TID) | ORAL | 0 refills
Start: 2022-11-24 — End: 2022-11-24

## 2022-11-24 MED ORDER — lisinopriL (PRINIVIL) tablet 10 mg
5 | Freq: Once | ORAL | Status: AC
Start: 2022-11-24 — End: 2022-11-24
  Administered 2022-11-24: 21:00:00 10 mg via ORAL

## 2022-11-24 MED ORDER — apixaban (ELIQUIS) tablet 2.5 mg
2.5 | Freq: Two times a day (BID) | ORAL
Start: 2022-11-24 — End: 2022-11-24
  Administered 2022-11-24: 15:00:00 2.5 mg via ORAL

## 2022-11-24 MED ORDER — apixaban (ELIQUIS) 2.5 mg Tab
2.5 | ORAL_TABLET | Freq: Two times a day (BID) | ORAL | 0 refills | 30.00000 days | Status: AC
Start: 2022-11-24 — End: 2022-12-24

## 2022-11-24 MED ORDER — magnesium oxide (MAG-OX) 400 mg tablet
400 | ORAL_TABLET | Freq: Three times a day (TID) | ORAL | 0 refills | Status: AC
Start: 2022-11-24 — End: 2022-11-26

## 2022-11-24 MED ORDER — lisinopriL (PRINIVIL) 10 MG tablet
10 | ORAL_TABLET | Freq: Every day | ORAL | 3 refills | 30.00000 days
Start: 2022-11-24 — End: 2023-01-01

## 2022-11-24 MED ORDER — sodium chloride 0.9% flush 10 mL
Freq: Every day | INTRAMUSCULAR | Status: AC | PRN
Start: 2022-11-24 — End: 2022-11-25

## 2022-11-24 MED ORDER — sodium chloride (OCEAN) 0.65 % nasal spray
0.65 | NASAL | 0 refills | PRN
Start: 2022-11-24 — End: 2022-11-24

## 2022-11-24 MED ORDER — sodium chloride (OCEAN) 0.65 % nasal spray
0.65 | NASAL | 0 refills | 30.00000 days | Status: AC | PRN
Start: 2022-11-24 — End: ?

## 2022-11-24 MED ORDER — sodium chloride 0.9 % infusion
INTRAVENOUS
Start: 2022-11-24 — End: 2022-11-24
  Administered 2022-11-24: 20:00:00 20 mL/h via INTRAVENOUS

## 2022-11-24 MED ORDER — heparin lock flush 100 unit/mL syringe 100 Units
100 | INTRAVENOUS | PRN
Start: 2022-11-24 — End: 2022-11-24
  Administered 2022-11-24: 22:00:00 100 [IU]

## 2022-11-24 MED ORDER — heparin lock flush syringe Syrg 30 Units
10 | INTRAVENOUS | PRN
Start: 2022-11-24 — End: 2022-11-24

## 2022-11-24 MED ORDER — heparin lock flush (porcine) vial Soln 500 Units
100 | Freq: Every day | INTRAVENOUS | Status: AC | PRN
Start: 2022-11-24 — End: 2022-11-25

## 2022-11-24 MED FILL — LISINOPRIL 5 MG TABLET: 5 5 MG | ORAL | Qty: 2

## 2022-11-24 MED FILL — CETIRIZINE 10 MG TABLET: 10 10 MG | ORAL | Qty: 1

## 2022-11-24 MED FILL — HEPARIN, PORCINE (PF) 100 UNIT/ML INTRAVENOUS SYRINGE: 100 100 unit/mL | INTRAVENOUS | Qty: 5

## 2022-11-24 MED FILL — HEPARIN, PORCINE (PF) 10 UNIT/ML INTRAVENOUS SYRINGE: 10 10 unit/mL | INTRAVENOUS | Qty: 10

## 2022-11-24 MED FILL — ELIQUIS 2.5 MG TABLET: 2.5 2.5 mg | ORAL | Qty: 1

## 2022-11-24 MED FILL — MAGNESIUM OXIDE 400 MG (241.3 MG MAGNESIUM) TABLET: 400 400 mg | ORAL | Qty: 1

## 2022-11-24 MED FILL — LEVOTHYROXINE 75 MCG TABLET: 75 75 MCG | ORAL | Qty: 1

## 2022-11-24 NOTE — Unmapped (Signed)
Problem: Acute Pain  Description: Patient's pain progressing toward patient's stated pain goal  Goal: Patient verbalizes a reduction in pain level  Outcome: Progressing

## 2022-11-24 NOTE — Unmapped (Addendum)
While we are waiting for your platelet count to increase, you will take a prophylactic dose of your Eliquis at 2.5mg  twice a day. When you are cleared to re-start your therapeutic dose of anti-coagulation, you will resume your dose at 5mg  twice a day. You will need to obtain labs prior to your appointment on 10/28/22. Your blood pressure medication was also increased to Lisinopril 20mg  daily. You can two 10mg  pills daily (you were previously prescribed 10mg  pills).Please call the office if you have any questions!      Anemia    Anemia is a condition in which there are not enough red blood cells or hemoglobin in the blood. Hemoglobin is a substance in red blood cells that carries oxygen.  When you do not have enough red blood cells or hemoglobin (are anemic), your body cannot get enough oxygen, and your organs may not work properly. As a result, you may feel very tired or have other problems.  What are the causes?  Common causes of anemia include:  Excessive bleeding. Anemia can be caused by excessive bleeding inside or outside the body, including bleeding from the intestines or from heavy menstrual periods in females.  Poor nutrition.  Long-lasting (chronic) kidney, thyroid, and liver disease.  Bone marrow disorders, spleen problems, and blood disorders.  Cancer and treatments for cancer.  Human immunodeficiency virus (HIV) and acquired immunodeficiency syndrome (AIDS).  Infections, medicines, and autoimmune disorders that destroy red blood cells.  What are the signs or symptoms?  Symptoms of this condition include:  Minor weakness.  Dizziness.  Headache, or difficulties concentrating and sleeping.  Heartbeats that feel irregular or faster than normal (palpitations).  Shortness of breath, especially with exercise.  Pale skin, lips, and nails, or cold hands and feet.  Upset stomach (indigestion) and nausea.  Symptoms may occur suddenly or develop slowly. If your anemia is mild, you may not have symptoms.  How is this  diagnosed?  This condition is diagnosed based on blood tests, your medical history, and a physical exam. In some cases, a test may be needed in which cells are removed from the soft tissue inside of a bone and looked at under a microscope (bone marrow biopsy). Your health care provider may also check your stool (feces) for blood and may do more testing to look for the cause of your bleeding.  Other tests may include:  Imaging tests, such as a CT scan or MRI.  A procedure to see inside your esophagus and stomach (endoscopy). The esophagus is the part of the body that moves food from your mouth to your stomach.  A procedure to see inside your colon and rectum (colonoscopy).  How is this treated?  Treatment for this condition depends on the cause. If you continue to lose a lot of blood, you may need to be treated at a hospital. Treatment may include:  Taking supplements of iron, vitamin B12, or folic acid.  Taking a hormone medicine (erythropoietin) that can help to stimulate red blood cell growth.  Receiving donated blood through an IV (blood transfusion). This may be needed if you lose a lot of blood.  Making changes to your diet.  Having surgery to remove your spleen.  Follow these instructions at home:  Take over-the-counter and prescription medicines only as told by your health care provider.  Take supplements only as told by your health care provider.  Follow any diet instructions that you were given by your health care provider.  Keep all follow-up  visits. Your health care provider will want to recheck your blood tests.  Contact a health care provider if:  You develop new bleeding anywhere in the body.  You are very weak.  Get help right away if:  You are short of breath.  You have pain in your abdomen or chest.  You are dizzy or feel faint.  You have trouble concentrating.  You have bloody stools, black stools, or tarry stools.  You vomit repeatedly or you vomit up blood.  These symptoms may be an emergency.  Get help right away. Call 911.  Do not wait to see if the symptoms will go away.  Do not drive yourself to the hospital.  Summary  Anemia is a condition in which you do not have enough red blood cells or enough of a substance in your red blood cells that carries oxygen.  Symptoms may occur suddenly or develop slowly.  If your anemia is mild, you may not have symptoms.  This condition is diagnosed with blood tests, a medical history, and a physical exam. Other tests may be needed.  Treatment for this condition depends on the cause of the anemia.  This information is not intended to replace advice given to you by your health care provider. Make sure you discuss any questions you have with your health care provider.  Document Revised: 02/17/2022 Document Reviewed: 02/17/2022  Elsevier Patient Education  2023 [employer].

## 2022-11-24 NOTE — Unmapped (Signed)
CCA obtained the patient IMM signature on 11/24/2022 at 12:49.   A copy was placed in the patient chart and the patient received a copy.    Burna MortimerNathalia Alysson Geist  Care Coordinator Assistant  Care Management Services  [phone]

## 2022-11-24 NOTE — Unmapped (Signed)
Kelsey Sullivan discharged per MD order. Peripheral IVs removed per protocol. Tip intact, bleeding controlled with no s/s of infection, and gauze and tape applied. Heparin locked and de-accessed port. Patient picking up medications at home pharmacy. Discharge instructions provided verbally and patient given home copy. Patient verbalized understanding of all instructions and was without questions. All patient belongings were gathered and sent with patient. Patient left 2T with transport via wheelchair.    Coralyn Pear, RN

## 2022-11-24 NOTE — Unmapped (Signed)
[facility]  Case Management/Social Work Department  Progress Note    Patient Information     Patient Name: Lorenda CahillKarin N Teall  MRN: [id number]  Hospital day: 3  Inpatient/Observation:  Inpatient   Level of Care:  Floor   Admit date:  11/21/2022  Admission diagnosis: Anemia, infectious workup    PMH:  has a past medical history of Bowel obstruction (CMS-HCC) (2018), Ovarian cancer (CMS-HCC) (2014), and Thyroid disease.    PCP:  Salina AprilJAMES I FIDELHOLTZ, MD    Home Pharmacy:    [facility] [id number] - [address] [place] - [address]  [address]  [place] [id number]  Phone: [phone]     [facility]  [address]  [place] [id number]  Phone: [phone]         Medical Insurance Coverage:  Payor: [employer] MANAGED MEDICARE / Plan: A[employer] MEDICARE / Product Type: *No Product type* /     Other Pertinent Information      HD#3   Presents as a direct admit in the setting of anemia and leukocytosis.     Reports doing well. Strongly desires discharge home today. Tolerating regular diet without N/V, passing gas, voiding spontaneously.     Discharge order noted. No care coordination needs identified.     Discharge Plan     Anticipated discharge plan:  Home      Anticipated discharge date:  11/24/2022      CM/SW will continue to follow and remain available for discharge planning needs.        Adele Danmy San Lohmeyer RN CCM  Care Coordination   [phone]

## 2022-11-24 NOTE — Unmapped (Signed)
[facility]  Inpatient Discharge Summary     Patient: Kelsey Sullivan  Age: 81 y.o.    MRN: [id number]   CSN: [id number]    Date of Admission: 11/21/2022  Date of Discharge: 11/24/2022  Gyn/Onc Physician: Dr. Montey Hora  Discharging Physician: Dr. 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 Report Verified by: Baird Cancer, MD at 11/21/2022 5:12 PM EST    CT Chest With IV contrast    Result Date: 11/17/2022  IMPRESSION: No evidence of intrathoracic metastatic disease. Report Verified by: Odie Sera, MD at 11/17/2022 11:56 AM EST    CT Abdomen and Pelvis With IV contrast    Result Date: 11/17/2022  IMPRESSION: Interval decrease in size of the peritoneal nodule in the dome of the right hepatic lobe. No evidence of new metastatic disease. Report Verified by: Wilford Sports, MD at 11/17/2022 11:46 AM EST    CT Chest With IV contrast    Result Date: 10/19/2022  IMPRESSION: 1.  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 Additional thrombus of the left internal and external jugular veins to the level of the carotid bifurcation. There is collateral flow in the left neck through the anterior jugular vein.  2.  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 3.  Approved by Ephriam Jenkins, MD on 10/19/2022 12:29 PM EST I have personally reviewed the images and I agree with this report. Report Verified by: Tobin Chad, MD at 10/19/2022 12:43 PM EST     Lab Review:  Lab Results   Component Value Date    WBC 13.9 (H) 11/23/2022    HGB 9.1 (L) 11/23/2022    HCT 26.2 (L) 11/23/2022    MCV 98.6 11/23/2022    PLT 26 (L) 11/23/2022     Lab Results   Component Value Date    CREATININE 0.56 (L) 11/23/2022    NA 139 11/23/2022    K 4.3 11/23/2022    CL 105 11/23/2022    CO2 27 11/23/2022     Consulting Services (include reason)     none    Allergies     Allergies   Allergen Reactions    Nsaids (Non-Steroidal Anti-Inflammatory Drug)      Stomach ache    Sulfamethoxazole-Trimethoprim Other (See Comments)     Hallucinations, anxiety  Other reaction(s): Unknown  loopy  hallucinations    Celecoxib Nausea And Vomiting    Metoclopramide Other (See Comments)     Does not remember  Discharge Medications     Discharge Medication List as of 11/24/2022  4:34 PM        CONTINUE these medications which have CHANGED    Details   !! apixaban (ELIQUIS) 2.5 mg Tab Take 1 tablet (2.5 mg total) by mouth 2 times a day for 30 days., Starting Mon 11/24/2022, Until Wed 12/24/2022, Normal, Disp-60 tablet, R-0      lisinopriL (PRINIVIL) 10 MG tablet Take 2 tablets (20 mg total) by mouth daily., Starting Mon 11/24/2022, Normal, Disp-30 tablet, R-3      magnesium oxide (MAG-OX) 400 mg tablet Take 1 tablet (400 mg total) by mouth every 8 hours for 5 doses., Starting Mon 11/24/2022, Until Wed 11/26/2022, Normal, Disp-5 tablet, R-0      sodium chloride (OCEAN) 0.65 % nasal spray Use 1 spray into each nostril if needed., Starting Mon 11/24/2022, Normal, Disp-44 mL, R-0       !! - Potential duplicate medications found. Please discuss with provider.        CONTINUE these medications which have NOT CHANGED    Details   !! apixaban (ELIQUIS) 5 mg Tab Take 1  tablet (5 mg total) by mouth 2 times a day., Starting Wed 11/19/2022, Until Mon 05/18/2023, Normal, Disp-180 tablet, R-1      ascorbic acid, vitamin C, (VITAMIN C) 1000 MG tablet Take 1 tablet (1,000 mg total) by mouth daily., Historical Med      cetirizine 10 mg Cap Take 10 mg by mouth daily., Starting Tue 09/09/2022, Historical Med      cholecalciferol, vitamin D3, (VITAMIN D3) 1000 units tablet Take 1 tablet (1,000 Units total) by mouth daily., Starting Thu 11/18/2018, No Print, Disp-30 tablet, R-0      dexAMETHasone (DECADRON) 4 MG tablet Take 8 mg by mouth daily with food on day 2, 3, and 4 of chemotherapy cycle, Normal, Disp-18 tablet, R-4      GLUTATHIONE-L ORAL Take 1 teaspoonful by mouth daily., Historical Med      IODINE ORAL Take 50 mg/day by mouth., Historical Med      Lactobac no.41/Bifidobact no.7 (PROBIOTIC-10 ORAL) Take 1 capsule by mouth daily. Garden [employer] brand, Historical Med      levothyroxine (SYNTHROID) 75 MCG tablet Take 1 tablet (75 mcg total) by mouth every morning before breakfast., Historical Med      !! UNABLE TO FIND Take 30 drops by mouth daily. Multimineral, Historical Med      !! UNABLE TO FIND Take 20 drops by mouth daily. Med Name: Lurena Joiner Drops, Historical Med      !! UNABLE TO FIND Take 2 capsules by mouth daily. Med Name: Body Bio, Historical Med      vit b complex w-b 12 tablet Take 1 tablet by mouth daily., Historical Med      ZINC CITRATE ORAL Take 1 tablet by mouth daily., Historical Med      omeprazole (PRILOSEC OTC) 20 MG tablet Take 1 tablet (20 mg total) by mouth every morning before breakfast. 20mg  twice a day, Historical Med       !! - Potential duplicate medications found. Please discuss with provider.        STOP taking these medications       apixaban (ELIQUIS) 5 mg (74 tabs) DsPk Comments:   Reason for Stopping:               Discharge Exam     See note from date of discharge    Reason for  Admission     Kelsey Sullivan is a 81 y.o. female who presented for  evaluation and treatment of anemia and leukocytosis.    Hospital Course     Active Hospital Problems    Diagnosis Date Noted    Leukocytosis [D72.829] 11/21/2022      Resolved Hospital Problems   No resolved problems to display.     Ms. Ottaway is a 81 y.o. female who called into to clinic on 11/21/22 after feeling fatigued, desiring labs and noted to have anemia and leukocytosis. She was admitted on 11/21/22 on C3 Day 16 Carbo/Gem/Bev for management of anemia and leukocytosis. Of note, patient was noted to have LUE/neck DVTs 10/2022 and was initiated on therapeutic lovenox, currently on eliquis 5mg  BID for indefinitely.    WBC 16.8, ANC 69629, patient was afebrile on presentation. Full infectious workup was pursued. In addition, patient was noted to have Hgb 7.1 and Plt 20.9. 1u pRBC ordered. In setting of thrombocytopenia, eliquis held.    Throughout admission, patient had negative infectious workup with improving leukocytosis, remained afebrile. Hemoglobin stable at 9.1 on discharge.    On the date of discharge she was afebrile, her vital signs were stable, patient tolerating regular diet without N/V, no signs or symptoms of infection.     Patient was discharged with instructions to restart eliquis 2.5mg  BID (prophylactic dosing) until repeat CBC with Plt > 50, at which time she can resume therapeutic dosing of eliquis 5mg  BID.    Discharge instructions were reviewed and further follow up is planned in the [facility]. Medications are per the medical reconciliation sheet.     She was discharged home in stable condition.     Condition on Discharge     1. Functional Status: normal   Describe limitations, if any: none    2. Mental Status: Alert/Oriented   Describe limitations, if any: none    3. Dietary Restrictions / Tube Feeding / TPN  Diet/Nutrition Orders   None     As listed above    4. Discharge specific orders:   DISCHARGE INSTRUCTIONS      Since you are continuing your recovery in the comfort of  your home, here are some points to keep in mind during your recovery:    Pain should be controlled by pain medication and should improve over time.   Please follow your medication instructions carefully.   A small amount of bleeding or drainage at the operative is normal, and to be expected.    No driving while on pain medications.  There are no specific diet limitations.   If nausea is present, limit yourself to liquids or semi-soft foods.     Call your physician if:    You experience excessive nausea and vomiting.   You experience excessive pain, which is not controlled by prescribed medication.  You experience a large amount of bleeding or drainage from the area of your surgery.   If you have a fever over 100.8 degrees.    Additional Instructions:    No Douches, tampons or intercourse for 6 weeks.  You may have a bloody discharge at times. Please call if you are having excessive bleeding (changing a pad an hour over 2-3 hours).   You may climb stairs with care and moderation.  You may take showers, but no tub baths for 6 weeks.   No heavy lifting (no more than 15 pounds) for 6 weeks.    See Dr. [name] as scheduled.  5. Core measures followed: (if this is a core measure patient)  Discharge Weight: 126 lb (57.2 kg)     Core Measure Documentation  Was the Influenza Vaccine Screening Completed?: Yes  Was the influenza vaccine ordered?: No  The core measures checklist is complete for discharge?: Yes    Disposition     Home     Follow-Up Appointments     Future Appointments   Date Time Provider Department Center   11/27/2022  9:15 AM PORT CHAIR 2 BC3 [facility] PORT 3 [facility]   11/27/2022  9:45 AM Rosemarie Beath [place], CNP [facility] GYN3 [facility]   11/27/2022 10:45 AM CHAIR 11 INF 3 BC [facility] INF3 [facility]   12/04/2022  9:00 AM Port Chair 1 Bc3 [facility] PORT 3 BC [facility]   12/04/2022  9:30 AM CHAIR 05 INF 3 BC [facility] INF3 [facility]   12/18/2022  9:45 AM Port Chair 1 Bc3 [facility] PORT 3 [facility]   12/18/2022 10:15 AM Mikle Bosworth, MD [facility] GYN3 [facility]    12/18/2022 11:15 AM CHAIR 01 INF 3 BC [facility] INF3 [facility]     Signed:    Garnet Sierras, MD  Obstetrics & Gynecology, PGY-2

## 2022-11-24 NOTE — Unmapped (Addendum)
Patient seen this afternoon. Doing stable from anemia standpoint. Plts stable at 26. Plan for chemo 12/21. Received 1u plts. Lisinopril up-titrated to 20mg  daily to help with hypertension. Informed to take ppx dose of eliquis at 2.5mg  BID until plpts >50 when she can resume therapeutic dosing with 5mg  BID. Patient overall clinically well appearing. Stable for d/c home. Pt reports understanding. Clinic messaged to be see with repeat laps and clinic visit 12/21.    Lawanna Kobus, M.D.  OBGYN PGY-4

## 2022-11-24 NOTE — Unmapped (Signed)
[facility]  Case Management/Social Work Department Brief Assessment    Re-Admission within 30 days   Planned or unplanned? Unplanned  If planned: Reason?  If unplanned: Reason? Anemia. Leukocytosis  Discuss with patient any barriers to prevent re-admission?     Patient Information   Admission diagnosis:  Leukocytosis  Demographic verified and updated as needed    Support Tour manager (POA or Next of Kin):  Lolita Cram    Phone #: [phone]   Relationship:  daughter     Living Arrangements Prior to Hospitalization      Patient lives in a duplex owned by daughter.  Patient lives on one side, daughter on the other.  Husband has dementia, but does not wander and has not posed a safety riskPatient and spouse usually live in [place]. Patient states she will be staying in [place] for treatment.  Web designer Resources Prior to Hospitalization   Home Health Care Services and Phone # none     Home Respiratory Services:   Provider and Phone #  Home O2:       Baseline Liter Flow:   BIPAP/CPAP:  Nebulizer:     Durable Medical Equipment:  none     Discharge Plan   Anticipated discharge plan: Home    Company Name(s) and Phone #      Anticipated discharge date: 11/24/2022      Transportation at Discharge: Daughter       Please contact CM/SW for any further discharge planning needs.    Patient/Family aware and taking part in the discharge plan.  Patient/family educated that once post-acute care needs have been identified, a provider list applicable to the identified post-acute care needs as well as the insurance provider will be provided, and patient/family have the freedom to choose their provider(s); financial interest(s) are disclosed as appropriate.     Adele Dan RN CCM  Care Coordination   [phone]

## 2022-11-24 NOTE — Unmapped (Addendum)
Gynecology Oncology Progress Note    Chief complaint: No chief complaint on file.    HPI: Kelsey Sullivan is 81 y.o. 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 This patient is s/p C6 Carbo/Taxol, C1 Rubraca, and now s/p C3D18 Carbo/Gem/Bev.     She presents to [facility] as a direct admit in the setting of anemia and leukocytosis. Patient reports calling in yesterday due to fatigue and episodes of epistaxis. She was then told to get lab work that demonstrated anemia and leukocytosis and recommended to present for direct admission.    24hr events:  No events overnight    Subjective:  Patient reports doing well. She is upset about her CT chest scan on 12/11 not being able to determine clot burden and not knowing the side effects of her anti-coagulation. She strongly desires discharge home today. Also wants to know when she can restart eliquis. She also wants to know when she can have a prognosis discussion in clinic. Tolerating regular diet without N/V, passing gas, voiding spontaneously.      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 Pt declined adjuvant therapy      R ovary and fallopain tube-  Serous adenocarcinoma, high grade, arising from the fallopian tube, infarcted  consistent with torsion. Serous carcionma in situ.       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     Posterior cul de sac biopsy-metastatic adenocarcinoma.     Omentectomy- metastatic adenocarcinoma     LND-negative     Residual right IP ligament, Anterior cul de sac biopsy, right pelvic side will biopsy, left pelvic side wall, right gutter and left gutter-->benign    04/06/2017:   CT [name]- mild small bowel obstruction with transition point in the left pelvis adjacent to the anterior abdominal wall. Low density in the common femoral veins bilaterally thought likely to represent flow artificat, may consider follow up with lomer extremity Doppler to ensure there is no underlying deep venous thrombosis.    01/29/2018:   CTAP:  Interval development of necrotic retroperitoneal lymphadenopathy with largest node measuring 4.9  4.2 x 4.8 cm. Origin is indeterminate.  Options would include CT directed biopsy as well as PET/CT.    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    03/04/2018:   PET/CT-  FDG avid retroperitoneal lymphadenopathy as described above and an FDG avid lesion over the dome of  the liver most likely a peritoneal implant rather than a liver metastasis.  No other focal areas of FD avid malignancy are found. Specifically, no lymph node activity is found outside of the retroperitoneum and there is no evidence for FDG avid pulmonary, skeletal or hepatic parenchymal metastasis.    02/2018:   Patient saw Dr. Ishmael Holter who recommended Carbo/Taxol with an AUC of 6.  Dr. Maren Reamer office submitted foundation one testing. Patient would like Genetic Testing    04/12/2018:   IR port placed    04/16/2018:   C1 weekly taxol 80 mg/m2 D1,8,15,  Carboplatin AUC 6 D1 q 21 days. PCEs on thurs, txt on fridays. EXAMS ON EVEN CYCLES     CA 125: 523.4     Genetic testing: Neg for CS mutation, no VUS, lifetime remaining breast cancer risk -3.4%    05/07/2018:   C2 weekly taxol 80 mg/m2 D1,8,15, Carboplatin AUC 6 D1 q 21 days. PCEs on thurs, txt on fridays. EXAMS ON EVEN CYCLES     CA 125: 162.2    05/20/2018:   Early PCE due to scheduling, due 6/21    05/28/2018  Delay C3 due to low ANC     CA 125: 25    06/04/2018  C3 weekly taxol 80 mg/m2 D1,8,15, Carboplatin AUC 6 D1 q 21 days. EXAMS ON EVEN CYCLES.      CA 125    06/25/2018:   HOLD chemo due to low anc (1047)- defer to next week, and change to q 28 days cycle to build in an off week to allow for counts to recover     CA 125: 10.6    07/02/2018:   C4 weekly taxol 80 mg/m2 D1,8,15, (missed day 5 due to admission) Carboplatin AUC 6 D1 q 28 days. ANC still lowish 1480s- neulasta Day 16      EXAMS ON EVEN CYCLES.  Review her genetics testing results      CA 125 7.2    07/07/2018:   [facility] admission: SBO, neutropenic fever     CTAP:Small bowel obstruction with transition point in the anterior mid abdomen near the midline. Small amount of free fluid in the abdomen, which is nonspecific. 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     Peritoneum: Small amount of free fluid in the abdomen. A para-aortic density with central low attenuation measures 2.5 x 2 cm and is significantly decreased in size from prior.    07/16/2018:   CA 125: 28.5    07/30/2018:  C5 weekly taxol 80 mg/m2 D1,8,15, Carboplatin AUC 6 D1 q 28 days. Neulasta Day 16      EXAMS ON EVEN CYCLES.      CA 125: 11.4    08/27/2018:   C6 weekly taxol 80 mg/m2 D1,8,15, Carboplatin AUC 6 D1 q 28 days. Neulasta Day 16. Order Ct CAP. RTC 10/11 (3 weeks)     EXAMS ON EVEN CYCLES. Discussed option of maintenance parp I- pt not decided (doesn't want to feel tired anymore)     CA 125: 8.7    09/20/2018:    Port removed: While the skin surface appeared inflamed, there was no evidence of infection in the port reservoir.    09/22/2018:   CT Chest: No evidence of thoracic metastatic disease.     Suspected small seroma in the base of the right neck.  Necrotic metastatic lesion could also have this appearance but considered less likely.  CtAP: Interval decrease in size of a left periaortic necrotic lymph node. No evidence of new metastatic disease.     Lymphatics: Previously demonstrated left para-aortic lymph node with central necrosis currently measures 1.2 cm on series 5 image 45, previously measured 1.8 cm in the short axis.    10/05/2018:   Reviewed scans- aortic node has decreased in size uncertain if active cancer or just scarring. Check PET (to investigate of node is hypermetabolic as well as assess for liver implant previously noted on pet. Offered maintenance with parp I- rubraca 600 mg BID. Pt interested-order placed thru spec pharmacy. RTC BC in a few weeks to start drug and review PET     10/15/2018:   PET: Resolution of the previous hypermetabolic para-aortic lymph nodes and hepatic dome lesions. Hypermetabolic focus within the left thyroid lobe.  Consider further evaluation with ultrasound, if clinically indicated.     NECK: There is a focus of uptake within the left thyroid lobe (axial image 62), with max SUV of 3.3    10/20/2018:   Thyroid US: 1.2 cm irregular hypoechoic nodule in the left thyroid lobe is suspicious. Recommend FNA for further evaluation.     1.0 cm nodule right thyroid lobe containing macrocalcification is indeterminate. Recommend follow-up thyroid ultrasound in one year to evaluate for stability.     Recommend= FNA for suspicious left thyroid nodule     10/21/2018:   Order thyroid FNA. Pt has not gotten her rubraca yet due to financial issues. RTC2 weeks for PCE#1 (11/12/18)     CA 125:   4.8    11/12/2018:   C1 rubraca 600 mg po BID. RTC 2 weeks for labs (CBC, CMP, 4 weeks for PCE #2).  Pt plans to go to [place] and may leave before PCE and re-est care at [facility] (Dr. Gerilyn Pilgrim). Referral to ENT placed (pt cancelled appt for 11/22) and FNA was prev ordered for thyroid nodule- but has not been done.      CA 125: 5.5    11/18/2018:   Prob visit for symptoms from rubraca:For supportive meds, discussed recommendation for nausea control to include Zofran three times a day, 30 min before morning dose of PARP inhibitor, and 30 min before meals and Pepcid complete 2 x a day or prilosec. For insomnia, benadryl at night and see if it improves fatigue during the day.  Of note, she is not interested in future if needed to try Ritalin. Pt to call in 1 week to review symptoms    11/26/18:   Pt calls to report she is doing better on above meds. Only complaint is sleeping too much. Advised she can cut the benadryl dose in half.     12/03/2018:   Pt self d/c'd rubraca due to side effects (decreased energy mainly, some nausea)    12/10/2018:   Pt desires to start again on lower dose- rx to speciality pharmacy for rubraca 500 mg po BID. Pt headed to [facility]- explained she needs to have labs checked q 2 weeks initially and to be followed by oncologist (Dr. Bayard Males, [facility]) fax [phone]    02/2019:   Pt stops rubraca due to side effects, dose has been reduced per pt    03/02/2019:   CTAP: ([facility] [place]): NED. Partially calcified right thyroid nodule and hypodense 4 mm thyroid nodule    06/13/2019:   CA 125: 5. RTC 3 mos for CA 125, exam. Pt to go back to [place] in 09/2019. Recommended  follow up for thyroid nodule    09/15/2019:   CA 125: 6.1. Pt to go back to [place] in 09/2019. Recommended follow up for thyroid nodule. Sent letter to Dr. Gerilyn Pilgrim in [place] fax [phone]    05/31/2020:   CA 125: 5.9    08/16/2020:   CA 125: 6.2, NED    11/15/2020:   CA 125: 5.9    06/24/2021:   CA 125: 6,. NED, plans to move to [place]    08/21/2022:   CA 125: 8.5, NED, offered CTs as she has had some GI issues recently- resolved w  omeprazole     08/25/2022:   PS Recurrence #2: CT AP: 2.6 cm lesion along the dome of the right hepatic lobe may represent a peritoneal lesion along the undersurface of the diaphragm rather than a true hepatic parenchymal lesion. 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   Lymphatics: Previously demonstrated left para-aortic lymph node has resolved in the interim. No new or enlarging lymph nodes. Post surgical changes related to lymph node dissection.   Peritoneum/Retroperitoneum: Subtle thickening of the peritoneum in the paracolic gutters, left greater than right. Mild thickening of the peritoneum in the pelvis posteriorly.   Ct C: NED- Few scattered sub-4 mm pulmonary nodules are unchanged, likely benign, with no new suspicious or enlarging pulmonary nodules for example 2 mm right lower lobe pulmonary nodule (series 5, image 224). . Nodular density related to the right hemidiaphragm on image 244 is related to an infradiaphragmatic peritoneal implant.   Plan PET to eval for FDG activity    08/29/2022:   PET:  Two FDG avid foci at the superior surface of right hepatic lobe, favored to be peritoneal nodules along the undersurface of right hemidiaphragm, concerning for metastases.      ABDOMEN AND PELVIS: *  Two FDG avid foci are seen along the surface of the right liver dome with Max SUV 9.9 on axial image 112 and max SUV 5.1 on axial image 108. *  No additional abnormal liver uptake.     9/28.2023:   FU- pet c/w recurrence- offer carbo gem bev- start 10/10 at [facility], surg onc ref for port placement     09/15/2022:   scheduled for Power Port Insertion with Dr. Andrey Campanile    09/16/2022:   C1 carboplatin AUC4, gem 1000 mg/m2 (D1/8), Bev D1 q 21 days. Watch BPs- prior to start of bev today Bps elevated, may need to start in future   CA 125:  7.5    10/07/2022:   Early PCE- pt moving chemo back to [facility]- ANC 600s- delay chemo 1 week, add neulasta  to D9, HTN due to bev- start lisinopril 10 mg      CA 125: 7.9    10/16/2022:   C2 carboplatin AUC4, gem 1000 mg/m2 (D1/8), Bev D1 q 21 days. 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 Neulasta D9 (started C2), RTC 3 weeks 11/30 C3  CA 125: 7.9    11/12-13/2023: UC admisison- Extensive LUE/shoulder/neck DVTPatient was admitted as above. She was started on a heparin drip and transitioned to eliquis on HD1. Vascular surgery was consulted with no further recommendations. Surgical Oncology was consulted to assess for need for port removal. Her port was accessed without difficulty and noted to be functioning well, therefore removal was not indicated. She will continue eliquis 10 mg BID for 7 days (11/13-11/19) followed by eliquis 5mg  BID for  6 months. Per surgical oncology, she only needs repeat imaging if the swelling gets worse while she is on [facility] or if the port becomes non-functional. There is no particular need for re-imaging in the next week, but may consider in the time frame of several weeks to months if there is no issues.    11/06/2022:   C3 carboplatin AUC4, gem 1000 mg/m2 (D1/8), Bev D1 q 21 days. 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 Neulasta D9 (started C2), LUE/neck DVTs 10/2022- on eliquis. RTC 12/14 early PCE w CTs prior, inf on 12/21,  CA 125: 11.1    11/17/2022:   CTAP: Interval decrease in size of the peritoneal nodule in the dome of the right hepatic lobe. No evidence of new metastatic disease. he lesion along the dome of the right hepatic lobe is not demonstrable on axial images. On sagittal images, it appears much smaller, having the appearance of a peritoneal nodule under the diaphragmatic surface, measuring 1.6 x 0.6 cm (series 602, image 41), previously 1.9 x 1.5 cm.   CTC: NED    11/20/2022:   Early PCE, review CTs- PR after C3, C4 12/21, RTC 1/11 C5 CB- doing ok- had some nose bleeds last week, better now. Recommend nasal saline- she is on DOAC, bev- so higher risk for bleeding.  Had diarrhea after chinese, getting better     11/21/2022:   Pt called in feeling poorly - WBC 16, plts 20s, hgb 7s- admit for blood, infectious work up    11/27/2022:   Plan C4 carboplatin AUC4, gem 1000 mg/m2 (D1/8), Bev D1 q 21 days. 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 Neulasta D9 (started C2), LUE/neck DVTs 10/2022- on eliquis.   CA 125      Disposition: chemo, imaging after 3 cucles, CA 125, possible parp maintenance after, genetic testing neg, [facility] one testing results from [facility] in [place]- this was not performed   Current disease status: Neg for CS mutation, no VUS, lifetime remaining breast cancer risk -3.4%  Genetics: negative for mutations, breast cancer lifetime risk 3.4%  Survivorship plan: pending completion of treatment              Past Medical History:   Diagnosis Date    Bowel obstruction (CMS-HCC) 2018    Ovarian cancer (CMS-HCC) 2014    Thyroid disease        Past Surgical History:   Procedure Laterality Date    ABDOMINOPLASTY  1999    BLEPHAROPLASTY  2000    DILATION AND CURETTAGE OF UTERUS  1979    ELBOW SURGERY  1990    EYE SURGERY  1994    INSERTION CATHETER VASCULAR ACCESS N/A 09/15/2022    Procedure: INSERTION POWER PORT A CATH;  Surgeon: Garrel Ridgel, MD;  Location: [facility] OR;  Service: General;  Laterality: N/A;  Left Subclavian    KNEE ARTHROPLASTY Right     ROTATOR CUFF REPAIR      TRANSUMBILICAL AUGMENTATION MAMMAPLASTY      VAGINAL HYSTERECTOMY  1979       Social History     Socioeconomic History    Marital status: Married     Spouse name: Not on file    Number of children: Not on file    Years of education: Not on file    Highest education level: Not on file   Occupational History    Not on file   Tobacco Use    Smoking status: Some Days  Packs/day: 0     Types: Cigarettes    Smokeless tobacco: Never    Tobacco comments:     Smokes 1 pack every three days    Vaping Use    Vaping Use: Never used   Substance and Sexual Activity    Alcohol use: Yes     Alcohol/week: 14.0  standard drinks of alcohol     Types: 14 Shots of liquor per week    Drug use: No    Sexual activity: Not Currently   Other Topics Concern    Caffeine Use Yes    Occupational Exposure No    Exercise Yes    Seat Belt Yes   Social History Narrative    Mammogram-6 years ago and it was normal. Pt stated she isn't getting them anymore.      Colonoscopy-10 years ago        Pt reports she can lie flat in bed without SOB.    Pt states she can walk a flight of stairs and city block without chest pain and SOB>       Social Determinants of Health     Food Insecurity: No Food Insecurity (11/21/2022)    Hunger Vital Sign     Worried About Running Out of Food in the Last Year: Never true     Ran Out of Food in the Last Year: Never true   Transportation Needs: No Transportation Needs (11/21/2022)    PRAPARE - Therapist, art (Medical): No     Lack of Transportation (Non-Medical): No   Intimate Partner Violence: Not At Risk (11/21/2022)    Humiliation, Afraid, Rape, and Kick questionnaire     Fear of Current or Ex-Partner: No     Emotionally Abused: No     Physically Abused: No     Sexually Abused: No   Housing Stability: Low Risk  (11/21/2022)    Housing Stability Vital Sign     Unable to Pay for Housing in the Last Year: No     Number of Places Lived in the Last Year: 1     Unstable Housing in the Last Year: No       No family history on file.    Current Facility-Administered Medications   Medication Dose Frequency Provider Last Admin    cetirizine  10 mg Daily 0900 Kearney Hard, MD 10 mg at 11/23/22 0981    levothyroxine  75 mcg QAM AC Garnet Sierras, MD 75 mcg at 11/23/22 1914    lisinopriL  10 mg Daily 0900 Garnet Sierras, MD 10 mg at 11/23/22 1215    sodium chloride  1 spray PRN Kearney Hard, MD         Allergies   Allergen Reactions    Nsaids (Non-Steroidal Anti-Inflammatory Drug)      Stomach ache    Sulfamethoxazole-Trimethoprim Other (See Comments)     Hallucinations, anxiety  Other reaction(s):  Unknown  loopy  hallucinations    Celecoxib Nausea And Vomiting    Metoclopramide Other (See Comments)     Does not remember       O:  Vitals:    11/23/22 1618 11/23/22 2015 11/24/22 0004 11/24/22 0327   BP: (!) 170/95 177/79 145/80 171/89   BP Location: Right upper arm Right upper arm Right upper arm Right upper arm   Patient Position: Sitting Lying Lying Sitting   BP Cuff Size: Regular      Pulse: 71 72 67 70  Resp: 16 16 16 16    Temp: 98.1 F (36.7 C) 98.2 F (36.8 C) 98.1 F (36.7 C) 97.7 F (36.5 C)   TempSrc: Oral Oral Oral Oral   SpO2: 99% 99% 99% 99%   Weight:       Height:         Gen: NAD  CV: well perfused  Resp: moving air well, no respiratory distress  Abd: soft, non-tender, non-distended  Ext: strong peripheral pulses, no edema    Labs:  Lab Results   Component Value Date    WBC 13.9 (H) 11/23/2022    HGB 9.1 (L) 11/23/2022    HCT 26.2 (L) 11/23/2022    MCV 98.6 11/23/2022    PLT 26 (L) 11/23/2022     Lab Results   Component Value Date    CREATININE 0.56 (L) 11/23/2022    BUN 10 11/23/2022    NA 139 11/23/2022    K 4.3 11/23/2022    CL 105 11/23/2022    CO2 27 11/23/2022       Imaging:  11/06/22 CT Chest with IV Contrast  No Evidence of Intrathoracic Metastatic Disease    11/06/22 CTAP w/IV Contrast  Interval decrease in size of the peritoneal nodule in the dome of the right hepatic lobe. No evidence of new metastatic disease    X-ray Portable Chest    Result Date: 11/21/2022  IMPRESSION: No acute cardiopulmonary abnormality. Report Verified by: Baird Cancer, MD at 11/21/2022 5:12 PM EST    CT Chest With IV contrast    Result Date: 11/17/2022  IMPRESSION: No evidence of intrathoracic metastatic disease. Report Verified by: Odie Sera, MD at 11/17/2022 11:56 AM EST    CT Abdomen and Pelvis With IV contrast    Result Date: 11/17/2022  IMPRESSION: Interval decrease in size of the peritoneal nodule in the dome of the right hepatic lobe. No evidence of new metastatic disease. Report Verified by:  Wilford Sports, MD at 11/17/2022 11:46 AM EST     Cultures:  Blood Cx 12/15 NGTD  Urine Cx 12/15 needs to be collected    Pathology:  N/A    [name]: HIBO BLASDELL is 81 y.o. 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 This patient is s/p C6 Carbo/Taxol, C1 Rubraca, now s/p C3D18 Carbo/Gem/Bev, and admitted for workup of anemia and leukocytosis.    Cardiovascular  HTN  - Lisinopril 10mg  daily, reordered inpatient  - BP with intermittent hypertension overnight. Will continue to monitor and consider acute anti-hypertensive agents     Anemia  - 12/15 Hgb 7.1 -> 1u pRBC (12/15) -> 7.6 -> 7.8 (12/16 AM) -> 1u (12/16) -> 8.8 -> 9.1  - no signs of ongoing bleeding, patient denies melena or hematuria or vaginal bleeding  - will not collect stool guiaic due to recent episodes of epistaxis and likely patient swallowing blood    Thrombocytopenia  - 12/15 Plt 29 -> 22 (12/16) -> 23 (12/16 AM) -> 26 (12/18)  - will restart with ppx dosing and wait for plt to increase >50 before transitioning back to therapeutic dosing   - will continue to monitor, if plt < 20, will transfuse    Respiratory  - NAI    FEN/GI/Renal   - NAI  - Replace electrolytes PRN to maintain K>4, Phos >3, Mag >2  ID  Leukocytosis, improving  - 12/15 WBC 16.8, ANC 96045 -> 15.4 -> 13.9 (11/24/22)  - afebrile on admission, continues to be afebrile  - Blood Cx NGTD, prelim result  - UA wnl 11/21/22  - Urine Cx to be collected   - CXR wnl 11/21/22  - Respiratory Virus Panel neg 11/21/22    Heme/Onc  Stage IIIC High Grade Serous Ovarian Adenocarcinoma  - 12/28/2012 - BSO, Omentectomy, LND. Diagnostic of serous ovarian adenocarcinoma. Omentectomy with metastatic adenocarcinoma. LND neg.   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  - 03/04/2018 FDG avid retroperitoneal lymphadenopathy as described above and an FDG avid lesion over the dome of the liver most likely a peritoneal implant rather than a liver metastasis.   - 04/12/2018 IR port placed  - 06/04/2018 s/p C3 Carbo/Taxol  - 06/25/2018 held chemo due to low anc (1047)   - 08/27/2018 s/p C6 Carbo/Taxol  - 09/20/2018 Port Removed  - 09/22/2018 CT CAP without evidence of thoracic metastatic disease and interval decrease in size of a left periaortic necrotic lymph node   - 10/15/2018 PET scan, resolution of previous hypermetabolic para-aortic lymph nodes and hepatic dome lesions. Hypermetabolic focus in left thyroid lobe  - 10/19/2018 Neck US with  1.2 cm irregular hypoechoic nodule in the left thyroid lobe is suspicious  - 11/12/2018: C1 rubraca  - 03/02/2019 CT Scan from [facility], no evidence for recurrent or new neoplasm. Increased colonic stool could indicate constipation. Postoperative changes  - 06/13/2019-02/06/2021, Stable CA125 (< 6.5)  - 08/25/22 CT a/p w/ 2.6 cm lesion along the dome of the right hepatic lobe may represent a peritoneal lesion along the undersurface of the diaphragm rather than a true hepatic parenchymal lesion. 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   - 08/29/22 PET with Two FDG avid foci at the superior surface of right hepatic lobe, favored to be peritoneal nodules along the undersurface of right hemidiaphragm, concerning for metastases.  - 09/15/22 Port insertion  -  11/06/22 s/p C3 Carbo/Gem/Bev  - Planned C4 11/27/22    LUE/neck DVTs  - noted to have LUE/neck DVTs 10/2022   - 1.  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 Additional thrombus of the left internal and external jugular veins to the level of the carotid bifurcation. There is collateral flow in the left neck through the anterior jugular vein.   2.  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   - currently on eliquis 5mg  BID x 6 months, held inpatient in setting of thrombocytopenia  - 11/17/22 CT chest re-read, not completed with SVC protocol, contrast was injected through R extremity that was not perfused through L extremity   - would have to get CT chest SVC protocol or CT chest with contrast in L arm  -Will continue [facility] management as mentioned below     Endocrine  Thyroid Disease  - 10/15/2018 PET scan, resolution  of previous hypermetabolic para-aortic lymph nodes and hepatic dome lesions. Hypermetabolic focus in left thyroid lobe  - 10/19/2018 Neck US with  1.2 cm irregular hypoechoic nodule in the left thyroid lobe is suspicious  - 03/02/2019: CTAP: NED. Partially calcified right thyroid nodule and hypodense 4 mm thyroid nodule  - TSH 1.66 (10/21/18) -> 12.01 (05/31/20) -> 5.91 (08/16/20) -> 2.62 (11/15/20)    Neuro  - NAI    MSK  - NAI    Prophylaxis  DVT  - SCD's  - eliquis 5mg  BID for LUE/neck DVTs, held currently, will restart with ppx dosing and wait for plt to increase >50 before transitioning back to therapeutic dosing     Dispo: inpatient    Lawanna Kobus, M.D.  OBGYN PGY-4

## 2022-11-24 NOTE — Unmapped (Shared)
This patient has no babies on file.

## 2022-11-24 NOTE — Unmapped (Signed)
Problem: Acute Pain  Description: Patient's pain progressing toward patient's stated pain goal  Goal: Patient displays improved well-being such as baseline levels for pulse, BP, respirations and relaxed muscle tone or body posture  Outcome: Progressing  Goal: Patient will manage pain with the appropriate technique/intervention  Description: Assess and monitor patient's pain using appropriate pain scale. Collaborate with interdisciplinary team and initiate plan and interventions as ordered.  Re-assess patient's pain level 30-60 minutes after pain management intervention.  Outcome: Progressing  Goal: Patient will reduce or eliminate use of analgesics  Outcome: Progressing

## 2022-11-27 ENCOUNTER — Other Ambulatory Visit: Admit: 2022-11-27 | Payer: Medicare (Managed Care)

## 2022-11-27 ENCOUNTER — Institutional Professional Consult (permissible substitution): Admit: 2022-11-27 | Payer: Medicare (Managed Care)

## 2022-11-27 ENCOUNTER — Ambulatory Visit: Admit: 2022-11-27 | Discharge: 2022-11-27 | Payer: Medicare (Managed Care)

## 2022-11-27 ENCOUNTER — Ambulatory Visit: Admit: 2022-11-27 | Payer: Medicare (Managed Care)

## 2022-11-27 DIAGNOSIS — C569 Malignant neoplasm of unspecified ovary: Secondary | ICD-10-CM

## 2022-11-27 DIAGNOSIS — R040 Epistaxis: Secondary | ICD-10-CM

## 2022-11-27 DIAGNOSIS — Z452 Encounter for adjustment and management of vascular access device: Secondary | ICD-10-CM

## 2022-11-27 DIAGNOSIS — D6481 Anemia due to antineoplastic chemotherapy: Secondary | ICD-10-CM

## 2022-11-27 LAB — PROTEIN / CREATININE RATIO, URINE
Creatinine, Urine: 84.3 mg/dL
Prot/Creat Ratio, Ur: 0.08 ratio
Total Protein, Ur: 7 mg/dL

## 2022-11-27 LAB — DIFFERENTIAL
Basophils Absolute: 105 /uL (ref 0–200)
Basophils Relative: 0.9 % (ref 0.0–1.0)
Eosinophils Absolute: 47 /uL (ref 15–500)
Eosinophils Relative: 0.4 % (ref 0.0–8.0)
Lymphocytes Absolute: 1509 /uL (ref 850–3900)
Lymphocytes Relative: 12.9 % (ref 15.0–45.0)
Monocytes Absolute: 854 /uL (ref 200–950)
Monocytes Relative: 7.3 % (ref 0.0–12.0)
Neutrophils Absolute: 9185 /uL (ref 1500–7800)
Neutrophils Relative: 78.5 % (ref 40.0–80.0)
nRBC: 0 /100 WBC (ref 0–0)

## 2022-11-27 LAB — BASIC METABOLIC PANEL
Anion Gap: 9 mmol/L (ref 3–16)
BUN: 18 mg/dL (ref 7–25)
CO2: 26 mmol/L (ref 21–33)
Calcium: 9.5 mg/dL (ref 8.6–10.3)
Chloride: 97 mmol/L (ref 98–110)
Creatinine: 0.79 mg/dL (ref 0.60–1.30)
EGFR: 75
Glucose: 128 mg/dL (ref 70–100)
Osmolality, Calculated: 278 mOsm/kg (ref 278–305)
Potassium: 4.6 mmol/L (ref 3.5–5.3)
Sodium: 132 mmol/L (ref 133–146)

## 2022-11-27 LAB — CBC
Hematocrit: 31.1 % (ref 35.0–45.0)
Hemoglobin: 10.8 g/dL (ref 11.7–15.5)
MCH: 34 pg (ref 27.0–33.0)
MCHC: 34.6 g/dL (ref 32.0–36.0)
MCV: 98.3 fL (ref 80.0–100.0)
MPV: 8.3 fL (ref 7.5–11.5)
Platelets: 177 10*3/uL (ref 140–400)
RBC: 3.16 10*6/uL (ref 3.80–5.10)
RDW: 15.9 % (ref 11.0–15.0)
WBC: 11.7 10*3/uL (ref 3.8–10.8)

## 2022-11-27 LAB — URINALYSIS W/RFL TO MICROSCOPIC
Bilirubin, UA: NEGATIVE
Blood, UA: NEGATIVE
Glucose, UA: NEGATIVE mg/dL
Ketones, UA: NEGATIVE mg/dL
Leukocytes, UA: NEGATIVE
Nitrite, UA: NEGATIVE
Protein, UA: NEGATIVE mg/dL
Specific Gravity, UA: 1.013 (ref 1.005–1.035)
Urobilinogen, UA: <2 mg/dL (ref 0.2–1.9)
pH, UA: 6 (ref 5.0–8.0)

## 2022-11-27 LAB — PHOSPHORUS: Phosphorus: 4.2 mg/dL (ref 2.1–4.5)

## 2022-11-27 LAB — CA 125: CA 125: 11.4 U/mL (ref 5.5–35.0)

## 2022-11-27 LAB — MAGNESIUM: Magnesium: 1.6 mg/dL (ref 1.5–2.5)

## 2022-11-27 MED ORDER — heparin lock flush 100 unit/mL syringe 500 Units
100 | Freq: Every day | INTRAVENOUS | PRN
Start: 2022-11-27 — End: 2022-11-27
  Administered 2022-11-27: 19:00:00 500 [IU]

## 2022-11-27 MED ORDER — dexAMETHasone (DECADRON) tablet 12 mg
4 | Freq: Once | ORAL | Status: AC
Start: 2022-11-27 — End: 2022-11-27
  Administered 2022-11-27: 16:00:00 12 mg via ORAL

## 2022-11-27 MED ORDER — sodium chloride 0.9% flush 10 mL
Freq: Every day | INTRAMUSCULAR | PRN
Start: 2022-11-27 — End: 2022-11-27
  Administered 2022-11-27: 19:00:00 10 mL via INTRAVENOUS

## 2022-11-27 MED ORDER — sodium chloride 0.9 % infusion
Freq: Every day | INTRAVENOUS | PRN
Start: 2022-11-27 — End: 2022-11-27
  Administered 2022-11-27 (×2): 25 mL/h via INTRAVENOUS

## 2022-11-27 MED ORDER — bevacizumab (AVASTIN) 850 mg in sodium chloride 0.9 % 100 mL chemo infusion
Freq: Once | INTRAVENOUS | Status: AC
Start: 2022-11-27 — End: 2022-11-27
  Administered 2022-11-27: 18:00:00 850 mg/kg via INTRAVENOUS

## 2022-11-27 MED ORDER — diphenhydrAMINE (BENADRYL) injection 25 mg
50 | Freq: Every day | INTRAMUSCULAR | PRN
Start: 2022-11-27 — End: 2022-11-27

## 2022-11-27 MED ORDER — sodium chloride 0.9% flush 10 mL
Freq: Every day | INTRAMUSCULAR | Status: AC | PRN
Start: 2022-11-27 — End: 2022-11-28
  Administered 2022-11-27: 15:00:00 10 mL via INTRAVENOUS

## 2022-11-27 MED ORDER — hydrocortisone sod succ (PF) (SOLU-CORTEF) injection 100 mg
100 | Freq: Every day | INTRAMUSCULAR | PRN
Start: 2022-11-27 — End: 2022-11-27

## 2022-11-27 MED ORDER — EPINEPHrine injection 0.3 mg
1 | Freq: Every day | INTRAMUSCULAR | PRN
Start: 2022-11-27 — End: 2022-11-27

## 2022-11-27 MED ORDER — CARBOplatin (PARAPLATIN) 280 mg in dextrose 5% in water (D5W) 250 mL chemo infusion
Freq: Once | INTRAVENOUS | Status: AC
Start: 2022-11-27 — End: 2022-11-27
  Administered 2022-11-27: 18:00:00 280 mg via INTRAVENOUS

## 2022-11-27 MED ORDER — heparin lock flush 100 unit/mL syringe 500 Units
100 | Freq: Every day | INTRAVENOUS | Status: AC | PRN
Start: 2022-11-27 — End: 2022-11-28

## 2022-11-27 MED ORDER — proCHLORPERazine (COMPAZINE) tablet 10 mg
10 | Freq: Four times a day (QID) | ORAL | PRN
Start: 2022-11-27 — End: 2022-11-27

## 2022-11-27 MED ORDER — LORazepam (ATIVAN) tablet 0.5 mg
0.5 | Freq: Four times a day (QID) | ORAL | PRN
Start: 2022-11-27 — End: 2022-11-27

## 2022-11-27 MED ORDER — aprepitant (CINVANTI) IV emulsion 130 mg
7.2 | Freq: Once | INTRAVENOUS | Status: AC
Start: 2022-11-27 — End: 2022-11-27
  Administered 2022-11-27: 16:00:00 130 mg via INTRAVENOUS

## 2022-11-27 MED ORDER — albuterol (PROVENTIL) 90 mcg/actuation inhaler 1-2 puff
90 | RESPIRATORY_TRACT | PRN
Start: 2022-11-27 — End: 2022-11-27

## 2022-11-27 MED ORDER — palonosetron (ALOXI) injection 0.25 mg
0.25 | Freq: Once | INTRAVENOUS | Status: AC
Start: 2022-11-27 — End: 2022-11-27
  Administered 2022-11-27: 16:00:00 0.25 mg via INTRAVENOUS

## 2022-11-27 MED ORDER — gemcitabine (GEMZAR) 1,600 mg in sodium chloride 0.9 % 100 mL chemo infusion
Freq: Once | INTRAVENOUS | Status: AC
Start: 2022-11-27 — End: 2022-11-27
  Administered 2022-11-27: 17:00:00 1600 mg/m2 via INTRAVENOUS

## 2022-11-27 MED FILL — DEXAMETHASONE 4 MG TABLET: 4 4 MG | ORAL | Qty: 3

## 2022-11-27 MED FILL — PALONOSETRON 0.25 MG/5 ML INTRAVENOUS SOLUTION: 0.25 0.25 mg/5 mL | INTRAVENOUS | Qty: 5

## 2022-11-27 MED FILL — CARBOPLATIN 10 MG/ML INTRAVENOUS SOLUTION: 10 10 mg/mL | INTRAVENOUS | Qty: 28

## 2022-11-27 MED FILL — AVASTIN 25 MG/ML INTRAVENOUS SOLUTION: 25 25 mg/mL | INTRAVENOUS | Qty: 34

## 2022-11-27 MED FILL — CINVANTI 7.2 MG/ML INTRAVENOUS EMULSION: 7.2 7.2 mg/mL | INTRAVENOUS | Qty: 18

## 2022-11-27 MED FILL — GEMCITABINE 200 MG INTRAVENOUS SOLUTION: 200 200 mg | INTRAVENOUS | Qty: 42.08

## 2022-11-27 NOTE — Unmapped (Signed)
Patient called in with questions about stopping Prilosec. Requesting call back.

## 2022-11-27 NOTE — Unmapped (Unsigned)
History of Present Illness  Kelsey Sullivan is a 81 y.o. female with   No chief complaint on file.    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 She declined adjuvant therapy. In 01/2018, she experienced mid abdominal pain, and seen by PCP and ED, with CT of abdomen and pelvis showing interval development of necrotic retroperitoneal lymphadenopathy with largest node measuring 4.9  4.2 x 4.8 cm. Her CA 125 on 02/18/18 was 197.8.  She underwent a ct-guided nodal biopsy on 02/26/18, with pathology confirming recurrent disease.  She completed 6 cycles of chemotherapy with Carboplatin and Paclitaxel-weekly (C6D1 was 08/27/18). CA 125 was 8.7 at C6.     She underwent CT CAP on 09/22/2018 revealed ned in chest and interval decrease in size of a left periaortic necrotic lymph node from 1.8 to 1.6 cm. 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 PET on 10/15/18 shows reassuring intraabdominal findings with resolution of the previous hypermetabolic para-aortic lymph nodes and hepatic dome lesions, but did note uptake within the thyroid. She underwent thyroid US on 10/20/18 with a 1.2 cm irregular hypoechoic nodule in the left thyroid lobe is suspicious. Recommended FNA for further evaluation, but she did not get this done.    She initiated maintenance rubraca 11/2018 but ultimately elected to stop in 02/2019 due to side effects.     She was seen for surveillance 08/21/2022 with an increase in her CA 125 to 8.5 - CT CAP and  PET were checked and consistent with platinum sensitive recurrence #2.   CT CAP on 08/25/2022 noted 2.6 cm lesion along the dome of the right hepatic lobe may represent a peritoneal lesion along the undersurface of the diaphragm rather than a true hepatic parenchymal lesion. 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   PET 08/29/2022 noted two FDG avid foci at the superior surface of right hepatic lobe, favored to be peritoneal nodules along the undersurface of right hemidiaphragm, concerning for metastases.     She proceeded on Carbo/gem/bev, C1 09/16/22. C2 was delayed to 10/16/2022 due to neutropenia, neulasta added for C2.    Patient was admitted from 11/12-11/13/23 to the hospital for extensive LUE/shoulder/neck DVT and was started on a heparin gtt and discharged home on eliquis. Per vascular surgery, there were no acute interventions needed besides anticoagulation. She was on eliquis 10 mg BID for 7 days (11/13-11/19) followed by eliquis 5mg  BID for 6 months. Per surgical oncology, she only needs repeat imaging if the swelling gets worse while she is on [facility] or if the port becomes non-functional.     She underwent CTs after C3 on 11/17/22 which showed decreased disease.     There are no changes to the cancer history HPI above.         11/17/2022:   CTAP: Interval decrease in size of the peritoneal nodule in the dome of the right hepatic lobe. No evidence of  new metastatic disease. he lesion along the dome of the right hepatic lobe is not demonstrable on axial images. On sagittal images, it appears much smaller, having the appearance of a peritoneal nodule under the diaphragmatic surface, measuring 1.6 x 0.6 cm (series 602, image 41), previously 1.9 x 1.5 cm.   CTC: NED      She is seen today for PCE.  Kelsey Sullivan is doing okay.   She reports nausea and diarrhea for the past 3 days. She reports eating [place] food on the  way home from her CT scans and started to feel poorly and have nose bleeding x24 hrs. She then gave herself an enema for the past 3 days which resulted in diarrhea. She had diarrhea again this morning but did not give herself enema today. She reports constipation after all of her chemo cycles.   She reports nausea is currently improved.   She denies any abdominal/pelvic pain.   She denies any bloating or fullness.  She denies any GU concerns. Denies any dysuria, frequency, hematuria, flank pain or fevers.  She denies any vaginal bleeding or discharge.   She denies neuropathy.   She reports no leg swelling.   Her appetite is good, and reports no nausea or vomiting.   She had some mild bone pain in hips following chemo, controlled with tylenol. Resolved now.        There are no changes to the cancer history HPI above.         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    Omentum-biopsy for frozen section-metastatic adenocarcionma.  Residual right IP ligament-benign smooth muscle and fibroadiopse tissue containing thick walled blood vessels.    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  Anterior cul de sac biopsy-benign fibroadipose tissue.   Posterior cul de sac biopsy-metastatic adenocarcinoma.  Right pelvic side will biopsy- benign fibroadipose tissue.  Left pelvic side wall, right gutter and Left  gutter-benign fibroadipose tissue.  Omentectomy- metastatic adenocarcinoma  LND-negative     12/28/2012 Surgery    BSO, omentectomy, LND     12/28/2012 Initial Diagnosis    Malignant neoplasm of both ovaries (CMS Dx)     01/08/2013 - 01/08/2013 Systemic Therapy (Oral and IV)    Patient refused chemotherapy and did alternative treatment including a raw diet for 1 year.       04/06/2017 Imaging    CT [name]- mild small bowel obstruction with transition point in the left pelvis adjacent to the anterior abdominal wall. Low density in the common femoral veins bilaterally thought likely to represent flow artificat, may consider follow up with lomer extremity Doppler to ensure there is no underlying deep venous thrombosis.     01/29/2018 Imaging    CT [name]-  Interval development of necrotic retroperitoneal lymphadenopathy with largest node measuring 4.9  4.2 x 4.8 cm. Origin is indeterminate.  Options would include CT directed biopsy as well as PET/CT.     02/18/2018 Tumor Markers    CA 125:  197.8     02/26/2018 Biopsy    CT guided Left retroperitoneal node biopsy- focally involved by non-small cell malignancy- additional stains are diagnostic of non-small cell carcionoma and supportive of involve57ment by the patients reported previous known ovarian malignancy.     03/04/2018 Imaging    PET/CT-  FDG avid retroperitoneal lymphadenopathy as described above and an FDG avid lesion over the dome of the liver most likely a peritoneal implant rather than a liver metastasis.  No other focal areas of FD avid malignancy are found. Specifically, no lymph node activity is found outside of the retroperitoneum and there is no evidence for FDG avid pulmonary, skeletal or hepatic parenchymal metastasis.     04/01/2018 Cancer Staged     Cancer Staging   Malignant neoplasm of ovary (CMS-HCC)  Staging form: Ovary, Fallopian Tube, And Primary Peritoneal Carcinoma, AJCC 8th Edition  - Clinical: FIGO Stage IIIC - Signed by Mikle Bosworth, MD on  04/01/2018         04/12/2018 Procedure    IR port placed       04/15/2018 Genetic Testing    Myraid Testing-Neg     04/16/2018 - 08/27/2018 Systemic Therapy (Oral and IV)    Treatment Summary   Treatment goal Disease control   Plan Name OP Gyn CARBOplatin AUC 6 / PACLitaxel Weekly   Status Active   Start Date 04/16/2018   End Date 09/10/2018 (Planned)   Provider Mikle Bosworth, MD   Chemotherapy PACLitaxel (TAXOL) 120 mg in sodium chloride 0.9 % 250 mL chemo infusion, 80 mg/m2 = 120 mg, Intravenous, Once, 4 of 6 cycles    CARBOplatin (PARAPLATIN) 480 mg in dextrose 5% in water (D5W) 250 mL chemo infusion, 480 mg, Intravenous, Once, 4 of 6 cycles  Dose modification: 497 mg (original dose 484.8 mg, Cycle 2, Reason: Other (See Comments))         04/16/2018:   C1 weekly taxol 80 mg/m2 D1,8,15, Carboplatin AUC 6 D1 q 21 days. PCEs on thurs, txt on fridays. EXAMS ON EVEN CYCLES     CA 125: 523.4     Genetic testing: Neg for CS mutation, no VUS, lifetime remaining breast cancer risk -3.4%    05/07/2018:   C2 weekly taxol 80 mg/m2 D1,8,15, Carboplatin AUC 6 D1 q 21 days. PCEs on thurs, txt on fridays. EXAMS ON EVEN CYCLES     CA 125: 162.2    05/20/2018:   Early PCE due to scheduling, due 6/21    05/28/2018  Delay C3 due to low ANC     CA 125: 25    06/04/2018  C3 weekly taxol 80 mg/m2 D1,8,15, Carboplatin AUC 6 D1 q 21 days. EXAMS ON EVEN CYCLES.      CA 125    06/25/2018:   HOLD chemo due to low anc (1047)- defer to next week, and change to q 28 days cycle to build in an off week to allow for counts to recover     CA 125: 10.6    07/02/2018:   C4 weekly taxol 80 mg/m2 D1,8,15, Carboplatin AUC 6 D1 q 28 days. EXAMS ON EVEN CYCLES.  Review her genetics testing results      CA 125 7.2    07/30/2018:  C5 weekly taxol 80 mg/m2 D1,8,15, Carboplatin AUC 6 D1 q 28 days. Neulasta Day 16  EXAMS ON EVEN CYCLES.      CA 125: 11.4    08/27/2018:   C6 weekly taxol 80 mg/m2 D1,8,15, Carboplatin AUC 6 D1 q 28 days. Neulasta Day 16. Order  Ct CAP. RTC 10/11 (3 weeks)     EXAMS ON EVEN CYCLES. Discussed option of maintenance parp I- pt not decided (doesn't want to feel tired anymore)     CA 125: 8.7       05/28/2018 Tumor Markers    Tumor markers tested:  CA 125.  Results:   25.     06/25/2018 Tumor Markers    Tumor markers tested:  CA 125.  Results:   10.6.     07/02/2018 Tumor Markers    Tumor markers tested:  CA 125.  Results:   7.2.     07/07/2018 Hospital Admission    Admit date: 7/31 - 07/13/18  Admission diagnosis: Partial SBO  Additional comments: On 7/31 she experienced abdominal pain at home that was sharp on her LLQ and had significant diarrhea and one episode of emesis. She came to be evaluated at the [facility]. She has a history of prior small bowel obstruction. She was give IL NS, 2mg  morphine IV, and 4mg  Zofran IV in clinic and admitted for further observation. The abdominal XR showed dilated bowel loops, concerning for obstruction. CT abdomen/pelvis showed dilation of the small bowel with transition point seen in the anterior mid abdomen near the midline.     07/07/2018 Imaging    Imaging Completed:  X-ray of  abdomen  Result: Nonspecific bowel gas pattern. The presence of multiple air-fluid levels in small bowel raises question of early or partial small bowel obstruction.     07/07/2018 Imaging    CTAP:Small bowel obstruction with transition point in the anterior mid abdomen near the midline. Small amount of free fluid in the abdomen, which is nonspecific. 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  Peritoneum: Small amount of free fluid in the abdomen. A para-aortic density with central low attenuation measures 2.5 x 2 cm and is significantly decreased in size from prior.       07/30/2018 Tumor Markers    Tumor markers tested:  CA 125.  Results:   11.4.     08/27/2018 Tumor Markers    Tumor markers tested:  CA 125.  Results:   8.7.     09/20/2018 Procedure    Port  removed: While the skin surface appeared inflamed, there was no evidence of infection in the port reservoir.       09/22/2018 Imaging    CT Chest-No evidence of thoracic metastatic disease.  Suspected small seroma in the base of the right neck.  Necrotic metastatic lesion could also have this appearance but considered less likely.    [name]- Interval decrease in size of a left periaortic necrotic lymph node. No evidence of new metastatic disease.     10/15/2018 Imaging    Imaging Completed:  PET scan of  whole body  Result: Resolution of the previous hypermetabolic para-aortic lymph nodes and hepatic dome lesions.  Hypermetabolic focus within the left thyroid lobe.  Consider further evaluation with ultrasound, if clinically indicated.       10/19/2018 Imaging    Imaging Completed:  Ultrasound of  neck  Result: 1.2 cm irregular hypoechoic nodule in the left thyroid lobe is suspicious. Recommend FNA for further evaluation.  1.0 cm nodule right thyroid lobe  containing macrocalcification is indeterminate. Recommend follow-up thyroid ultrasound in one year to evaluate for stability.     10/21/2018 Tumor Markers    Tumor markers tested:  CA 125.  Results:   4.8.     11/12/2018 Tumor Markers    Tumor markers tested:  CA 125  Results:   5.5     11/12/2018 - 12/03/2018 Systemic Therapy (Oral and IV)    11/12/2018: C1 rubraca 600 mg po BID. RTC 2 weeks for labs (CBC, CMP, 4 weeks for PCE #2). Pt plans to go to [place] and may leave before PCE and re-est care at [facility] (Dr. Gerilyn Pilgrim). Referral to ENT placed (pt cancelled appt for 11/22) and FNA was prev ordered for thyroid nodule- but has not been done.        03/02/2019 Imaging    CT Scan from [facility]    No evidence for recurrent or new neoplasm. Increased colonic stool could indicate constipation. Postoperative changes     06/13/2019 Tumor Markers    Tumor markers tested: CA 125 Results:   5.0.     09/15/2019 Tumor Markers    CA 125-6.1     05/31/2020 Tumor Markers    Ca  125-5.9     08/16/2020 Tumor Markers    CA 125-6.2     02/06/2021 Tumor Markers    CA 125-4.6     08/25/2022 Imaging    CT a/p: 2.6 cm lesion along the dome of the right hepatic lobe may represent a peritoneal lesion along the undersurface of the diaphragm rather than a true hepatic parenchymal lesion. 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     CT chest: No evidence of intrathoracic metastatic disease.      08/29/2022 Imaging    PET: 1.  Two FDG avid foci at the superior surface of right hepatic lobe, favored to be peritoneal nodules along the undersurface of right hemidiaphragm, concerning for metastases.      09/15/2022 Procedure    09/15/2022:   scheduled for Power Port Insertion with Dr. Andrey Campanile      09/16/2022 -  Systemic Therapy (Oral and IV)    09/16/2022:   C1 carboplatin AUC4, gem 1000 mg/m2 (D1/8), Bev D1 q 21 days  Treatment Summary   Treatment goal Disease control   Plan Name OP Gyn CARBOplatin AUC 4 / Gemcitabine / Bevacizumab   Status Active   Start Date 09/16/2022   End Date 12/04/2022 (Planned)   Provider Mikle Bosworth, MD   Chemotherapy bevacizumab (AVASTIN) 850 mg in sodium chloride 0.9 % 100 mL chemo infusion, 15 mg/kg = 850 mg, Intravenous, Once, 3 of 4 cycles  Administration: 850 mg (09/16/2022), 850 mg (10/16/2022), 850 mg (11/06/2022)  gemcitabine (GEMZAR) 1,600 mg in sodium chloride 0.9 % 100 mL chemo infusion, 1,000 mg/m2 = 1,600 mg, Intravenous, Once, 3 of 4 cycles  Administration: 1,600 mg (09/16/2022), 1,600 mg (09/23/2022), 1,600 mg (10/16/2022), 1,600 mg (10/23/2022), 1,600 mg (11/06/2022), 1,600 mg (11/13/2022)            09/16/2022 Tumor Markers    Tumor markers tested:  CA 125  Results:   7.5     10/07/2022 Tumor Markers    CA125: 7.9     10/16/2022 Tumor Markers    CA 125: 7.9     11/06/2022 Tumor Markers    CA 125: 11.1     11/17/2022 Imaging    Imaging Completed:  CT Scan of  chest, abdomen,  and pelvis  AP: Interval decrease in size of the peritoneal nodule in the dome of the right hepatic lobe. No evidence of new metastatic disease.   CHEST: No evidence of intrathoracic metastatic disease.            Past Medical History  She  has a past medical history of Bowel obstruction (CMS-HCC) (2018), Ovarian cancer (CMS-HCC) (2014), and Thyroid disease.  Past Medical History:   Diagnosis Date    Bowel obstruction (CMS-HCC) 2018    Ovarian cancer (CMS-HCC) 2014    Thyroid disease        Past Surgical History  Past Surgical History:   Procedure Laterality Date    ABDOMINOPLASTY  1999    BLEPHAROPLASTY  2000    DILATION AND CURETTAGE OF UTERUS  1979    ELBOW SURGERY  1990    EYE SURGERY  1994    INSERTION CATHETER VASCULAR ACCESS N/A 09/15/2022    Procedure: INSERTION POWER PORT A CATH;  Surgeon: Garrel Ridgel, MD;  Location: [facility] OR;  Service: General;  Laterality: N/A;  Left Subclavian    KNEE ARTHROPLASTY Right     ROTATOR CUFF REPAIR      TRANSUMBILICAL AUGMENTATION MAMMAPLASTY      VAGINAL HYSTERECTOMY  1979       Family History  No family history on file.    Family cancer history for ovarian, uterine and colon cancer is negative other than above.       Social History  Social History     Socioeconomic History    Marital status: Married     Spouse name: Not on file    Number of children: Not on file    Years of education: Not on file    Highest education level: Not on file   Occupational History    Not on file   Tobacco Use    Smoking status: Some Days     Packs/day: 0     Types: Cigarettes    Smokeless tobacco: Never    Tobacco comments:     Smokes 1 pack every three days    Vaping Use    Vaping Use: Never used   Substance and Sexual Activity    Alcohol use: Yes     Alcohol/week: 14.0 standard drinks of alcohol     Types: 14 Shots of liquor per week    Drug use: No    Sexual activity: Not Currently   Other Topics Concern    Caffeine Use Yes    Occupational Exposure No    Exercise Yes    Seat Belt Yes   Social  History Narrative    Mammogram-6 years ago and it was normal. Pt stated she isn't getting them anymore.      Colonoscopy-10 years ago        Pt reports she can lie flat in bed without SOB.    Pt states she can walk a flight of stairs and city block without chest pain and SOB>       Social Determinants of Health     Financial Resource Strain: Not on file   Food Insecurity: No Food Insecurity (11/21/2022)    Hunger Vital Sign     Worried About Running Out of Food in the Last Year: Never true     Ran Out of Food in the Last Year: Never true   Transportation Needs: No Transportation Needs (11/21/2022)    PRAPARE -  Therapist, art (Medical): No     Lack of Transportation (Non-Medical): No   Physical Activity: Not on file   Stress: Not on file   Social Connections: Not on file   Intimate Partner Violence: Not At Risk (11/21/2022)    Humiliation, Afraid, Rape, and Kick questionnaire     Fear of Current or Ex-Partner: No     Emotionally Abused: No     Physically Abused: No     Sexually Abused: No   Housing Stability: Low Risk  (11/21/2022)    Housing Stability Vital Sign     Unable to Pay for Housing in the Last Year: No     Number of Places Lived in the Last Year: 1     Unstable Housing in the Last Year: No       Past OB/GYN History  G3P2  She reports menarche at age 47 and menopause at age 5-surgical.  She denies a history of STIs.  She denies a history of abnormal cervical cytology and reports her last cytologic examination she is unsure of. She has taken HRT.     Health maintenance:  Mammogram: Date 6 years ago Results normal  Colonoscopy: Date 10 years ago Results normal    Allergies  She is allergic to nsaids (non-steroidal anti-inflammatory drug), sulfamethoxazole-trimethoprim, celecoxib, and metoclopramide.    BMI  There is no height or weight on file to calculate BMI.    Disease Status: No evidence of disease/remission (08/21/2022 12:27 PM)              Histories  She has a past medical  history of Bowel obstruction (CMS-HCC) (2018), Ovarian cancer (CMS-HCC) (2014), and Thyroid disease.    She has a past surgical history that includes Dilation and curettage of uterus (1979); Vaginal hysterectomy (1979); Elbow surgery (1990); Eye surgery (1994); Abdominoplasty (1999); Blepharoplasty (2000); Transumbilical augmentation mammaplasty; Rotator cuff repair; Knee Arthroplasty (Right); and insertion catheter vascular access (N/A, 09/15/2022).    Her family history is not on file.    She reports that she has been smoking cigarettes. She has never used smokeless tobacco. She reports current alcohol use of about 14.0 standard drinks of alcohol per week. She reports that she does not use drugs.    Allergies  Nsaids (non-steroidal anti-inflammatory drug), Sulfamethoxazole-trimethoprim, Celecoxib, and Metoclopramide    Medications  Outpatient Encounter Medications as of 11/27/2022   Medication Sig Dispense Refill    apixaban (ELIQUIS) 2.5 mg Tab Take 1 tablet (2.5 mg total) by mouth 2 times a day for 30 days. 60 tablet 0    apixaban (ELIQUIS) 5 mg Tab Take 1 tablet (5 mg total) by mouth 2 times a day. 180 tablet 1    ascorbic acid, vitamin C, (VITAMIN C) 1000 MG tablet Take 1 tablet (1,000 mg total) by mouth daily.      cetirizine 10 mg Cap Take 10 mg by mouth daily.      cholecalciferol, vitamin D3, (VITAMIN D3) 1000 units tablet Take 1 tablet (1,000 Units total) by mouth daily. 30 tablet 0    dexAMETHasone (DECADRON) 4 MG tablet Take 8 mg by mouth daily with food on day 2, 3, and 4 of chemotherapy cycle 18 tablet 4    GLUTATHIONE-L ORAL Take 1 teaspoonful by mouth daily.      IODINE ORAL Take 50 mg/day by mouth.      Lactobac no.41/Bifidobact no.7 (PROBIOTIC-10 ORAL) Take 1 capsule by mouth daily. [employer]  brand      levothyroxine (SYNTHROID) 75 MCG tablet Take 1 tablet (75 mcg total) by mouth every morning before breakfast.      lisinopriL (PRINIVIL) 10 MG tablet Take 2 tablets (20 mg total) by mouth  daily. 30 tablet 3    magnesium oxide (MAG-OX) 400 mg tablet Take 1 tablet (400 mg total) by mouth every 8 hours for 5 doses. 5 tablet 0    omeprazole (PRILOSEC OTC) 20 MG tablet Take 1 tablet (20 mg total) by mouth every morning before breakfast. 20mg  twice a day      sodium chloride (OCEAN) 0.65 % nasal spray Use 1 spray into each nostril if needed. 44 mL 0    UNABLE TO FIND Take 30 drops by mouth daily. Multimineral      UNABLE TO FIND Take 20 drops by mouth daily. Med Name: Lurena Joiner Drops      UNABLE TO FIND Take 2 capsules by mouth daily. Med Name: Body Bio      vit b complex w-b 12 tablet Take 1 tablet by mouth daily.      ZINC CITRATE ORAL Take 1 tablet by mouth daily.       Facility-Administered Encounter Medications as of 11/27/2022   Medication Dose Route Frequency Provider Last Rate Last Admin    [EXPIRED] heparin lock flush (porcine) vial Soln 500 Units  500 Units Intravenous Daily PRN Lawanna Kobus, MD        [EXPIRED] sodium chloride 0.9% flush 10 mL  10 mL Intravenous Daily PRN Lawanna Kobus, MD            Review of Systemssee HPI    Vitals  There were no vitals taken for this visit.    Physical Exam   Vitals reviewed.  Constitutional: She is oriented to person, place, and time. She appears well-developed.   HENT:   Head: Atraumatic.   Nose: Nose normal.   Neck: No thyromegaly present.   Cardiovascular: Normal rate.   Pulmonary/Chest: Effort normal.   Abdominal: Soft. Normal appearance. She exhibits no distension and no mass. There is no abdominal tenderness. There is no rebound and no guarding.   Genitourinary:    Genitourinary Comments: def     Musculoskeletal:         General: Normal range of motion.      Cervical back: Neck supple.   Neurological: She is alert and oriented to person, place, and time.   Skin: Skin is warm and dry.   Psychiatric: Her behavior is normal.          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     Posterior cul de sac biopsy-metastatic adenocarcinoma.     Omentectomy- metastatic adenocarcinoma     LND-negative     Residual right IP ligament, Anterior cul de sac biopsy, right pelvic side will biopsy, left pelvic side wall, right gutter and left gutter-->benign    04/06/2017:   CT [name]- mild small bowel obstruction with transition point in the left pelvis adjacent to the anterior abdominal wall. Low density in the common femoral veins bilaterally thought likely to represent flow artificat, may consider follow up with lomer extremity Doppler to ensure there is no underlying deep venous thrombosis.    01/29/2018:   CTAP:  Interval development of necrotic retroperitoneal lymphadenopathy with largest node measuring 4.9  4.2 x 4.8 cm. Origin is indeterminate.  Options would include CT directed biopsy as well as PET/CT.    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    03/04/2018:   PET/CT-  FDG avid retroperitoneal lymphadenopathy as described above and an FDG avid lesion over the dome of the liver most likely  a peritoneal implant rather than a liver metastasis.  No other focal areas of FD avid malignancy are found. Specifically, no lymph node activity is found outside of the retroperitoneum and there is no evidence for FDG avid pulmonary, skeletal or hepatic parenchymal metastasis.    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 No retroperitoneal, intra-abdominal, pelvic or inguinal adenopathy, no evidence for peritoneal surface neoplastic involvement. 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 She completed 6 cycles of chemotherapy with Carboplatin and Paclitaxel-weekly (C6D1 was 08/27/18). CA 125 was 8.7 at C6.     She underwent CT CAP on 09/22/2018 revealed ned in chest and interval decrease in size of a left periaortic necrotic lymph node from 1.8 to 1.6 cm. 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     PET on 10/15/18 shows reassuring intraabdominal findings with resolution of the previous hypermetabolic para-aortic lymph nodes and hepatic dome lesions, but did note uptake within the thyroid. She underwent thyroid US on 10/20/18 with a 1.2 cm  irregular hypoechoic nodule in the left thyroid lobe is suspicious. Recommend FNA for further evaluation.     She initiated maintenance rubraca 11/2018 but ultimately elected to stop in 02/2019 due to side effects.   Patient  had reestarted drug at 500 mg po BID once she got down to [place]-. Was in [place] from 12/2018-05/2019. Reports her oncologist down in [place] had changed doses of Rubraca a few times, to 300mg  po BID but decided to stop drug all together back in 02/2019 2/2 to decreased QOL. She does not wish to restart drug at this time. Last CT scan 03/02/2019 in [place], no evidence of recurrent or new neoplasm.     She was seen for surveillance 08/21/2022 with an increase in her CA 125 to 8.5 - CT CAP and PET were checked and consistent with platinum sensitive recurrence #2.     At this time, she has decided to proceed with chemotherapy.    She underwent repeat CTs following C3 on 11/17/22 which showed partial response with interval decrease in size of the peritoneal nodule in the dome of the right hepatic lobe NED in the chest.     She is seen today for early PCE Prior to C4 of Carboplatin/Gemcitabine/bevacizumab. Neulasta added to C2D9, (planned 11/27/22).      She is tolerating chemotherapy appropriately well, and has experienced expected but manageable side effects. I have reviewed her labs and she is cleared chemotherapy. There is no evidence of progressive disease or excessive toxicity today, and we will continue treatment without dose modification. We will see her back for her next cycle of treatment, and she knows to call if questions or problems arise.     11/20/2022:   Early PCE, review CTs- PR after C3, C4 12/21, RTC 1/11 C5 CB- doing ok- had some nose bleeds last week, better now. Recommend nasal saline- she is on DOAC, bev- so higher risk for bleeding. Had diarrhea after chinese, getting better     11/27/2022:   Plan C4 carboplatin AUC4, gem 1000 mg/m2 (D1/8), Bev D1 q 21 days. 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 Neulasta D9 (started C2), LUE/neck DVTs 10/2022- on eliquis.   CA 125    __ RTC in 4 wks for C5  __Neulasta added to D9 C2  __Lisinopril added for HTN 10/07/2022  __new LUE/neck DVT 12/2021- on elqiuis- see below    VTE:  -Patient was admitted from 11/12-11/13/23 to the hospital for extensive LUE/shoulder/neck DVT and was started on a heparin gtt and discharged home on eliquis. Per vascular surgery, there were no acute interventions needed besides anticoagulation. She was on eliquis 10 mg BID for 7 days (11/13-11/19) followed by eliquis 5mg  BID for 6 months. Per surgical oncology, she only needs repeat imaging if the swelling gets worse while she is on [facility] or if the port becomes non-functional. Clot burden on exam is improved  - had two weeks of multiple nose bleeds on the eliquis but stopped taking the herbs that might have been interacting with her eliquis and that stopped her nose bleeds   -She is going to continue on her bev    Thyroid Nodule:   Incidental finding on PET scan of hypermetabolic focus within left thyroid nodule. Ultrasound performed on 10/20/18 with 1.2 cm irregular hypoechoic nodule, appearing suspicious. Recommend FNA for further evaluation, which was ordered. Pt has not followed up to date.  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 Referral to ENT made, but patient did not see them. She followed up with her PCP and was started on Synthroid. Last TSH 5.91. Repeat TSH/T4/T3 labs wnl 11/2020       Genetics:  previously discussed her negative genetic  testing results.    Pain    Pain Score: Zero (11/24/2022  8:01 AM)    She denies presence of pain.  Pain will be reassessed at next visit.          The HPI, ROS, and test results were reviewed and copied forward (with edits) from a note written by Montey Hora, MD  on 11/06/22. I have reviewed and updated the history, physical exam, data, assessment, and plan of the note so that it reflects  my evaluation and management of the patient.

## 2022-11-27 NOTE — Unmapped (Signed)
11/27/22 0930   Specimen Collection Status   Specimen Collection Unit   Port A Cath Left Chest   No placement date or time found.   Orientation: Left  Location: Chest   Line Intervention Accessed   Site Assessment No problems identified;Clean;Dry;Intact   Line Status Blood return noted;Flushed;Saline locked   Needle Size 20 gauge, 0.75 huber   Dressing Type Transparent   Dressing Status Clean;Dry;Intact   Dressing Intervention New dressing   Accessed/Deaccessed by: Lajean Silvius RN   Access Attempts 1   Flush Performed Yes     Pt here for [facility] access with labs as ordered. Pt to see MD followed by infusion. Allergies have been reviewed with patient.  ort site appears without any redness or swelling noted. PAC accessed per protocol. Labs obtained as ordered without incidence. Pt without complaints of pain, burning, pressure or stinging. Refer to Flowsheet for details.

## 2022-11-27 NOTE — Unmapped (Signed)
Pt ambulatory in infusion suite for carbo/gemzar/avastin infusion. MD/APP follow-up. Pac accessed in port room and labs drawn. Labs reviewed and treatment ordered. Call light placed within reach. Pac checked for +br before starting treatment. Treatment administered as ordered. Pt tolerated well. Pac flushed and de accessed, bandage applied. Pt left treatment suite in stable condition. AVS printed

## 2022-11-28 NOTE — Unmapped (Signed)
Patient advised her AVS stated she had stopped taking PPI.  Patient reported to this RN she has no intention of stopping and is still taking medication at this time.

## 2022-12-04 ENCOUNTER — Other Ambulatory Visit: Admit: 2022-12-04 | Payer: Medicare (Managed Care)

## 2022-12-04 ENCOUNTER — Institutional Professional Consult (permissible substitution): Admit: 2022-12-04 | Payer: Medicare (Managed Care)

## 2022-12-04 ENCOUNTER — Ambulatory Visit: Admit: 2022-12-04 | Payer: Medicare (Managed Care)

## 2022-12-04 DIAGNOSIS — C569 Malignant neoplasm of unspecified ovary: Secondary | ICD-10-CM

## 2022-12-04 DIAGNOSIS — C562 Malignant neoplasm of left ovary: Secondary | ICD-10-CM

## 2022-12-04 LAB — CBC
Hematocrit: 30.9 % (ref 35.0–45.0)
Hemoglobin: 10.6 g/dL (ref 11.7–15.5)
MCH: 33.7 pg (ref 27.0–33.0)
MCHC: 34.3 g/dL (ref 32.0–36.0)
MCV: 98.3 fL (ref 80.0–100.0)
MPV: 7.9 fL (ref 7.5–11.5)
Platelets: 168 10*3/uL (ref 140–400)
RBC: 3.14 10*6/uL (ref 3.80–5.10)
RDW: 15.6 % (ref 11.0–15.0)
WBC: 2.5 10*3/uL (ref 3.8–10.8)

## 2022-12-04 LAB — COMPREHENSIVE METABOLIC PANEL
ALT: 32 U/L (ref 7–52)
AST: 27 U/L (ref 13–39)
Albumin: 4.3 g/dL (ref 3.5–5.7)
Alkaline Phosphatase: 101 U/L (ref 36–125)
Anion Gap: 7 mmol/L (ref 3–16)
BUN: 16 mg/dL (ref 7–25)
CO2: 29 mmol/L (ref 21–33)
Calcium: 9.8 mg/dL (ref 8.6–10.3)
Chloride: 98 mmol/L (ref 98–110)
Creatinine: 0.72 mg/dL (ref 0.60–1.30)
EGFR: 84
Glucose: 121 mg/dL (ref 70–100)
Osmolality, Calculated: 280 mOsm/kg (ref 278–305)
Potassium: 4.2 mmol/L (ref 3.5–5.3)
Sodium: 134 mmol/L (ref 133–146)
Total Bilirubin: 0.5 mg/dL (ref 0.0–1.5)
Total Protein: 6.8 g/dL (ref 6.4–8.9)

## 2022-12-04 LAB — DIFFERENTIAL
Basophils Absolute: 40 /uL (ref 0–200)
Basophils Relative: 1.6 % (ref 0.0–1.0)
Eosinophils Absolute: 13 /uL (ref 15–500)
Eosinophils Relative: 0.5 % (ref 0.0–8.0)
Lymphocytes Absolute: 795 /uL (ref 850–3900)
Lymphocytes Relative: 31.8 % (ref 15.0–45.0)
Monocytes Absolute: 223 /uL (ref 200–950)
Monocytes Relative: 8.9 % (ref 0.0–12.0)
Neutrophils Absolute: 1430 /uL (ref 1500–7800)
Neutrophils Relative: 57.2 % (ref 40.0–80.0)
nRBC: 0 /100 WBC (ref 0–0)

## 2022-12-04 LAB — MAGNESIUM: Magnesium: 1.6 mg/dL (ref 1.5–2.5)

## 2022-12-04 MED ORDER — pegfilgrastim (NEULASTA Kit) delayed injection 6 mg
6 | Freq: Once | SUBCUTANEOUS | Status: AC
Start: 2022-12-04 — End: 2022-12-04
  Administered 2022-12-04: 17:00:00 6 mg via SUBCUTANEOUS

## 2022-12-04 MED ORDER — heparin lock flush 100 unit/mL syringe 500 Units
100 | Freq: Every day | INTRAVENOUS | PRN
Start: 2022-12-04 — End: 2022-12-04
  Administered 2022-12-04: 17:00:00 500 [IU]

## 2022-12-04 MED ORDER — dexAMETHasone (DECADRON) tablet 12 mg
4 | Freq: Once | ORAL | Status: AC
Start: 2022-12-04 — End: 2022-12-04
  Administered 2022-12-04: 16:00:00 12 mg via ORAL

## 2022-12-04 MED ORDER — gemcitabine (GEMZAR) 1,600 mg in sodium chloride 0.9 % 100 mL chemo infusion
200 | Freq: Once | INTRAVENOUS | Status: AC
Start: 2022-12-04 — End: 2022-12-04
  Administered 2022-12-04: 17:00:00 1600 mg/m2 via INTRAVENOUS

## 2022-12-04 MED ORDER — LORazepam (ATIVAN) tablet 1 mg
1 | Freq: Four times a day (QID) | ORAL | PRN
Start: 2022-12-04 — End: 2022-12-04

## 2022-12-04 MED ORDER — LORazepam (ATIVAN) injection 1 mg
2 | Freq: Four times a day (QID) | INTRAMUSCULAR | PRN
Start: 2022-12-04 — End: 2022-12-04

## 2022-12-04 MED ORDER — sodium chloride 0.9% flush 20 mL
Freq: Every day | INTRAMUSCULAR | Status: AC | PRN
Start: 2022-12-04 — End: 2022-12-05
  Administered 2022-12-04: 14:00:00 20 mL via INTRAVENOUS

## 2022-12-04 MED ORDER — sodium chloride 0.9 % infusion
Freq: Every day | INTRAVENOUS | PRN
Start: 2022-12-04 — End: 2022-12-04
  Administered 2022-12-04: 16:00:00 25 mL/h via INTRAVENOUS

## 2022-12-04 MED ORDER — sodium chloride 0.9% flush 10 mL
Freq: Every day | INTRAMUSCULAR | PRN
Start: 2022-12-04 — End: 2022-12-04
  Administered 2022-12-04 (×2): 10 mL via INTRAVENOUS

## 2022-12-04 MED ORDER — heparin lock flush 100 unit/mL syringe 500 Units
100 | Freq: Every day | INTRAVENOUS | Status: AC | PRN
Start: 2022-12-04 — End: 2022-12-05

## 2022-12-04 MED FILL — DEXAMETHASONE 4 MG TABLET: 4 4 MG | ORAL | Qty: 3

## 2022-12-04 MED FILL — NEULASTA ONPRO 6 MG/0.6 ML WITH WEARABLE SUBCUTANEOUS INJECTOR: 6 6 mg/0.6 mL | SUBCUTANEOUS | Qty: 0.6

## 2022-12-04 MED FILL — GEMCITABINE 200 MG INTRAVENOUS SOLUTION: 200 200 mg | INTRAVENOUS | Qty: 42.08

## 2022-12-04 NOTE — Unmapped (Signed)
Arrived for cycle 4 day 8 Gemzar/Neulasta POD.  Meds and allergies reviewed with patient. Fall risk assessment completed.  Pt is not considered a fall risk.  Call light placed within reach of patient.  Lab results reviewed.  ANC 1430 was reported to [employer], CNP.  Okay to treat order obtained.  Treatment administered as ordered.  Tolerated infusion well and left department ambulatory.  She declined the AVS.  Future infusion appointments reviewed with patient prior to DC.  Infusion appointment confirmed for 12/18/22.

## 2022-12-04 NOTE — Unmapped (Signed)
0900 Port accessed for labs prior to office visit and treatment. Port site appears unremarkable;pt denies any complaints with port.      12/04/22 0900   Specimen Collection Status   Specimen Collection Unit   Port A Cath Left Chest   No placement date or time found.   Orientation: Left  Location: Chest   Line Intervention Accessed   Site Assessment No problems identified   Line Status Blood return noted;Flushed   Needle Size 20 gauge, 0.75 huber   Dressing Type Transparent   Dressing Intervention New dressing   Accessed/Deaccessed by: SN   Access Attempts 1   Flush Performed Yes

## 2022-12-09 NOTE — Unmapped (Addendum)
Returning patient's call. Patient with no needs at this time.

## 2022-12-09 NOTE — Unmapped (Signed)
Patient called in wanting to speak with one of Dr Billingsley's nurses.  She would like a call back at [phone].

## 2022-12-10 NOTE — Unmapped (Signed)
Community Health Work (CHW) Progress Note   Patient: Kelsey Sullivan MRN: [id number] DOB: 1941/04/19   Date: 12/11/2022   Type of Visit: Care Coordination     Intervention Summary   Patient called in to this CHW stating that she's been trying to get in touch with a SW. CHW explained her role and asked the reason she was calling in today. Patient states that she is in need of immediate psychological help and explains that she has just been going through a very hard time. CHW was able to help de-escalate with the patient and informed the patient she would connect the patient directly to a [place] SW. Patient expressed understanding and CHW was able to successfully route the patient to [place].      Care Plan Updates   There are no care plans that you recently modified to display for this patient.       Upcoming Appointments   Future Appointments   Date Time Provider [place]   12/18/2022  9:45 AM Port Chair 1 Bc3 [facility] PORT 3 [facility]   12/18/2022 10:15 AM Desmond Lope, MD [facility]   12/18/2022 11:15 AM CHAIR 01 INF 3 BC [facility] INF3 [facility]        CHW Follow-Up   Patient's case is being closed. There will be no additional follow-up and patient has been provided contact information to reach out if they have any future needs.      Everardo All, [place] Worker  [facility]  [place],  [address]  Office: [phone]  Mobile: [phone]  Email: [email or web address]

## 2022-12-10 NOTE — Unmapped (Signed)
CHW Kelsey Sullivan received  a phone call that pt was in need of a Child psychotherapist to call her back because she needed some psychological resources.  SW D. Fredric Mare assisted for SW [employer].      SW called indicated patient back who identified herself as Kelsey Sullivan, calling for her mother, Kelsey Sullivan. She stated her mother is going through her third round of chemo.  She has become increasingly agitated and in need of counseling services.      Informed her that we have resources for [facility] [phone] which would be best matched with patients needs. However There is also the [facility], Psychology today, private practices, [address] and  [address].  Provided her the phone number for mobile Crisis.     Advised Kelsey Sullivan that in order for this SW to make a referral for pt. I would need to speak with patient and she would have to agree.      SW agreed to call [facility] prior to calling Pt to see how long of a wait there would be, as Kelsey Sullivan was at work.     SW left a VM message for Kelsey Sullivan at [facility] [phone] ext [phone]. She did call back, left a VM and said the wait was about 3 weeks.     As SW was hanging up from leaving VM with [facility],  [name] was calling the office. SW spoke with her and advised her dtr had called me.  She also expressed feeling overwhelmed and agitated. Provided her with emotional support, validation and brief counseling. She agreed to referral to [facility]. I also provided her with the phone number to [facility] and Mobile Crisis. SW explained Mobile Crisis resources.       SW made referral through [facility] portal and left a VM message for Kelsey Sullivan at [facility] that I was making the referral.      SW called pts dtr back and had to leave a VM with the updated information on what is talking place.       SW also indicated to Pt and Dtr that [name]  [phone] and gave her phone  number as well.       Updated SW Kelsey Sullivan for continuity of care     [name] MSW LISW -[facility]   Social Worker  [phone]      Future Appointments   Date Time Provider Department Center   12/18/2022  9:45 AM Port Chair 1 Bc3 [facility] PORT 3 [facility]   12/18/2022 10:15 AM Mikle Bosworth, MD [facility]   12/18/2022 11:15 AM CHAIR 01 INF 3 BC [facility] INF3 [facility]

## 2022-12-18 ENCOUNTER — Ambulatory Visit: Admit: 2022-12-18 | Payer: Medicare (Managed Care)

## 2022-12-18 ENCOUNTER — Other Ambulatory Visit: Admit: 2022-12-18 | Payer: Medicare (Managed Care)

## 2022-12-18 ENCOUNTER — Institutional Professional Consult (permissible substitution): Admit: 2022-12-18 | Payer: Medicare (Managed Care)

## 2022-12-18 ENCOUNTER — Ambulatory Visit: Admit: 2022-12-18 | Discharge: 2022-12-18 | Payer: Medicare (Managed Care)

## 2022-12-18 DIAGNOSIS — C569 Malignant neoplasm of unspecified ovary: Secondary | ICD-10-CM

## 2022-12-18 DIAGNOSIS — Z452 Encounter for adjustment and management of vascular access device: Secondary | ICD-10-CM

## 2022-12-18 DIAGNOSIS — Z5111 Encounter for antineoplastic chemotherapy: Secondary | ICD-10-CM

## 2022-12-18 LAB — COMPREHENSIVE METABOLIC PANEL
ALT: 25 U/L (ref 7–52)
AST: 25 U/L (ref 13–39)
Albumin: 4.3 g/dL (ref 3.5–5.7)
Alkaline Phosphatase: 129 U/L (ref 36–125)
Anion Gap: 9 mmol/L (ref 3–16)
BUN: 10 mg/dL (ref 7–25)
CO2: 26 mmol/L (ref 21–33)
Calcium: 9.4 mg/dL (ref 8.6–10.3)
Chloride: 98 mmol/L (ref 98–110)
Creatinine: 0.7 mg/dL (ref 0.60–1.30)
EGFR: 87
Glucose: 131 mg/dL (ref 70–100)
Osmolality, Calculated: 277 mOsm/kg (ref 278–305)
Potassium: 4.6 mmol/L (ref 3.5–5.3)
Sodium: 133 mmol/L (ref 133–146)
Total Bilirubin: 0.3 mg/dL (ref 0.0–1.5)
Total Protein: 6.7 g/dL (ref 6.4–8.9)

## 2022-12-18 LAB — URINALYSIS W/RFL TO MICROSCOPIC
Bilirubin, UA: NEGATIVE
Blood, UA: NEGATIVE
Glucose, UA: NEGATIVE mg/dL
Ketones, UA: NEGATIVE mg/dL
Nitrite, UA: NEGATIVE
Protein, UA: NEGATIVE mg/dL
RBC, UA: 1 /HPF (ref 0–3)
Specific Gravity, UA: 1.013 (ref 1.005–1.035)
Squam Epithel, UA: 1 /HPF (ref 0–5)
Urobilinogen, UA: <2 mg/dL (ref 0.2–1.9)
WBC, UA: 1 /HPF (ref 0–5)
pH, UA: 6 (ref 5.0–8.0)

## 2022-12-18 LAB — CBC
Hematocrit: 28.5 % — ABNORMAL LOW (ref 35.0–45.0)
Hemoglobin: 9.8 g/dL — ABNORMAL LOW (ref 11.7–15.5)
MCH: 34.5 pg — ABNORMAL HIGH (ref 27.0–33.0)
MCHC: 34.4 g/dL (ref 32.0–36.0)
MCV: 100.2 fL — ABNORMAL HIGH (ref 80.0–100.0)
MPV: 8.1 fL (ref 7.5–11.5)
Platelets: 128 10E3/uL — ABNORMAL LOW (ref 140–400)
RBC: 2.84 10E6/uL — ABNORMAL LOW (ref 3.80–5.10)
RDW: 17.5 % — ABNORMAL HIGH (ref 11.0–15.0)
WBC: 8.4 10E3/uL (ref 3.8–10.8)

## 2022-12-18 LAB — PROTEIN / CREATININE RATIO, URINE
Creatinine, Urine: 84 mg/dL
Prot/Creat Ratio, Ur: 0.11 ratio
Total Protein, Ur: 9 mg/dL

## 2022-12-18 LAB — MAGNESIUM: Magnesium: 1.6 mg/dL (ref 1.5–2.5)

## 2022-12-18 LAB — DIFFERENTIAL
Basophils Absolute: 50 /uL (ref 0–200)
Basophils Relative: 0.6 % (ref 0.0–1.0)
Eosinophils Absolute: 25 /uL (ref 15–500)
Eosinophils Relative: 0.3 % (ref 0.0–8.0)
Lymphocytes Absolute: 1184 /uL (ref 850–3900)
Lymphocytes Relative: 14.1 % — ABNORMAL LOW (ref 15.0–45.0)
Monocytes Absolute: 798 /uL (ref 200–950)
Monocytes Relative: 9.5 % (ref 0.0–12.0)
Neutrophils Absolute: 6342 /uL (ref 1500–7800)
Neutrophils Relative: 75.5 % (ref 40.0–80.0)
nRBC: 0 /100{WBCs} (ref 0–0)

## 2022-12-18 LAB — CA 125: CA 125: 9.8 U/mL (ref 5.5–35.0)

## 2022-12-18 MED ORDER — heparin lock flush 100 unit/mL syringe 500 Units
100 | Freq: Every day | INTRAVENOUS | Status: AC | PRN
Start: 2022-12-18 — End: 2022-12-19
  Administered 2022-12-18: 16:00:00 500 [IU] via INTRAVENOUS

## 2022-12-18 MED ORDER — sodium chloride 0.9% flush 10 mL
Freq: Every day | INTRAMUSCULAR | Status: AC | PRN
Start: 2022-12-18 — End: 2022-12-19
  Administered 2022-12-18 (×2): 10 mL via INTRAVENOUS

## 2022-12-18 MED ORDER — apixaban (ELIQUIS) 5 mg Tab
5 | ORAL_TABLET | Freq: Two times a day (BID) | ORAL | 1 refills | Status: AC
Start: 2022-12-18 — End: 2023-06-16

## 2022-12-18 NOTE — Unmapped (Signed)
0940 am -  Patient arrived for port access, office visit and scheduled treatment today. Allergies reviewed with patient. Port accessed per protocol without difficulty, positive blood return noted. Epic lab draw as ordered. See flow sheet for port details.     12/18/22 0940   Specimen Collection Status   Specimen Collection Unit   Port A Cath Left Chest   No placement date or time found.   Orientation: Left  Location: Chest   Line Intervention Accessed   Site Assessment No problems identified   Line Status Blood return noted;Flushed   Needle Size 20 gauge, 0.75 huber   Dressing Type Transparent   Dressing Status Clean;Dry;Intact   Dressing Intervention New dressing   Accessed/Deaccessed by: R. Winona Legato RN   Access Attempts 1   Reaccess Due Date 12/25/22   Flush Performed Yes   Date to be Reflushed 12/19/22   Dressing Change Due Date 12/25/22

## 2022-12-18 NOTE — Unmapped (Signed)
rescheduled

## 2022-12-18 NOTE — Unmapped (Signed)
1108 am - treatment held today. Returned to port room for de-access.  Allergies reviewed. Per protocol port flushed with 26ml of normal saline, followed with 38ml heparinized saline 500units/ml. Huber needle removed, gauze and paper tape applied. See flow sheet for port details.

## 2022-12-18 NOTE — Unmapped (Addendum)
History of Present Illness  Kelsey Sullivan is a 82 y.o. 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 She declined adjuvant therapy. In 01/2018, she experienced mid abdominal pain, and seen by PCP and ED, with CT of abdomen and pelvis showing interval development of necrotic retroperitoneal lymphadenopathy with largest node measuring 4.9  4.2 x 4.8 cm. Her CA 125 on 02/18/18 was 197.8.  She underwent a ct-guided nodal biopsy on 02/26/18, with pathology confirming recurrent disease.  She completed 6 cycles of chemotherapy with Carboplatin and Paclitaxel-weekly (C6D1 was 08/27/18). CA 125 was 8.7 at C6.     She underwent CT CAP on 09/22/2018 revealed ned in chest and interval decrease in size of a left periaortic necrotic lymph node from 1.8 to 1.6 cm. 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 PET on 10/15/18 shows reassuring intraabdominal findings with resolution of the previous hypermetabolic para-aortic lymph nodes and hepatic dome lesions, but did note uptake within the thyroid. She underwent thyroid US on 10/20/18 with a 1.2 cm irregular hypoechoic nodule in the left thyroid lobe is suspicious. Recommended FNA for further evaluation, but she did not get this done.    She initiated maintenance rubraca 11/2018 but ultimately elected to stop in 02/2019 due to side effects.     She was seen for surveillance 08/21/2022 with an increase in her CA 125  to 8.5 - CT CAP and PET were checked and consistent with platinum sensitive recurrence #2.   CT CAP on 08/25/2022 noted 2.6 cm lesion along the dome of the right hepatic lobe may represent a peritoneal lesion along the undersurface of the diaphragm rather than a true hepatic parenchymal lesion. 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   PET 08/29/2022 noted two FDG avid foci at the superior surface of right hepatic lobe, favored to be peritoneal nodules along the undersurface of right hemidiaphragm, concerning for metastases.     She proceeded on Carbo/gem/bev, C1 09/16/22. C2 was delayed to 10/16/2022 due to neutropenia, neulasta added for C2.    Patient was admitted from 11/12-11/13/23 to the hospital for extensive LUE/shoulder/neck DVT and was started on a heparin gtt and discharged home on eliquis. Per vascular surgery, there were no acute interventions needed besides anticoagulation. She was on eliquis 10 mg BID for 7 days (11/13-11/19) followed by eliquis 5mg  BID for 6 months. Per surgical oncology, she only needs repeat imaging if the swelling gets worse while she is on [facility] or if the port becomes non-functional.     She underwent CTs after C3 on 11/17/22 which showed decreased disease.     She was admitted 11/21/22-11/24/22 for management of anemia and leukocytosis. Throughout admission, patient had negative infectious workup with improving leukocytosis, remained afebrile. Hemoglobin stable at 9.1 on discharge. Patient was discharged with instructions to restart  eliquis 2.5mg  BID (prophylactic dosing) until repeat CBC with Plt > 50, at which time she can resume therapeutic dosing of eliquis 5mg  BID.    There are no changes to the cancer history HPI above.       She is seen today for PCE.  Kelsey Sullivan is doing okay.  Reports that she always has lower abdominal cramping and increased frequency of nose bleeds after eating  take out, thinks it is due to MSG- planning on decreasing MSG.  Reports otherwise stable epistaxis.  She denies any current abdominal/pelvic pain.   She denies any bloating or fullness.  She reports normal bowel/bladder habits.  She denies any GU concerns. Denies any dysuria, frequency, hematuria, flank pain or fevers.  She denies any vaginal bleeding or discharge.   She denies neuropathy.   She reports no leg swelling.   Her appetite is good, and reports no nausea or vomiting.   She had some mild bone pain in hips following chemo, controlled with tylenol. Resolved now.  Compliant with Eliquis 2.5mg  BID.    There are no changes to the cancer history HPI above.       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    Omentum-biopsy for frozen section-metastatic adenocarcionma.  Residual right IP ligament-benign smooth muscle and fibroadiopse tissue containing thick walled blood vessels.    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  Anterior cul de sac biopsy-benign fibroadipose tissue.   Posterior cul de sac biopsy-metastatic adenocarcinoma.  Right pelvic side will biopsy- benign fibroadipose tissue.  Left pelvic side wall, right gutter and Left gutter-benign fibroadipose tissue.  Omentectomy- metastatic adenocarcinoma  LND-negative     12/28/2012 Surgery    BSO, omentectomy, LND     12/28/2012 Initial  Diagnosis    Malignant neoplasm of both ovaries (CMS Dx)     01/08/2013 - 01/08/2013 Systemic Therapy (Oral and IV)    Patient refused chemotherapy and did alternative treatment including a raw diet for 1 year.       04/06/2017 Imaging    CT [name]- mild small bowel obstruction with transition point in the left pelvis adjacent to the anterior abdominal wall. Low density in the common femoral veins bilaterally thought likely to represent flow artificat, may consider follow up with lomer extremity Doppler to ensure there is no underlying deep venous thrombosis.     01/29/2018 Imaging    CT [name]-  Interval development of necrotic retroperitoneal lymphadenopathy with largest node measuring 4.9  4.2 x 4.8 cm. Origin is indeterminate.  Options would include CT directed biopsy as well as PET/CT.     02/18/2018 Tumor Markers    CA 125:  197.8     02/26/2018 Biopsy    CT guided Left retroperitoneal node biopsy- focally involved by non-small cell malignancy- additional stains are diagnostic of non-small cell carcionoma and supportive of involve41ment  by the patients reported previous known ovarian malignancy.     03/04/2018 Imaging    PET/CT-  FDG avid retroperitoneal lymphadenopathy as described above and an FDG avid lesion over the dome of the liver most likely a peritoneal implant rather than a liver metastasis.  No other focal areas of FD avid malignancy are found. Specifically, no lymph node activity is found outside of the retroperitoneum and there is no evidence for FDG avid pulmonary, skeletal or hepatic parenchymal metastasis.     04/01/2018 Cancer Staged     Cancer Staging   Malignant neoplasm of ovary (CMS-HCC)  Staging form: Ovary, Fallopian Tube, And Primary Peritoneal Carcinoma, AJCC 8th Edition  - Clinical: FIGO Stage IIIC - Signed by Desmond Lope, MD on 04/01/2018         04/12/2018 Procedure    IR port placed       04/15/2018 Genetic Testing    Myraid Testing-Neg     04/16/2018 - 08/27/2018 Systemic Therapy (Oral and  IV)    Treatment Summary   Treatment goal Disease control   Plan Name OP Gyn CARBOplatin AUC 6 / PACLitaxel Weekly   Status Active   Start Date 04/16/2018   End Date 09/10/2018 (Planned)   Provider Desmond Lope, MD   Chemotherapy PACLitaxel (TAXOL) 120 mg in sodium chloride 0.9 % 250 mL chemo infusion, 80 mg/m2 = 120 mg, Intravenous, Once, 4 of 6 cycles    CARBOplatin (PARAPLATIN) 480 mg in dextrose 5% in water (D5W) 250 mL chemo infusion, 480 mg, Intravenous, Once, 4 of 6 cycles  Dose modification: 497 mg (original dose 484.8 mg, Cycle 2, Reason: Other (See Comments))         04/16/2018:   C1 weekly taxol 80 mg/m2 D1,8,15, Carboplatin AUC 6 D1 q 21 days. PCEs on thurs, txt on fridays. EXAMS ON EVEN CYCLES     CA 125: 523.4     Genetic testing: Neg for CS mutation, no VUS, lifetime remaining breast cancer risk -3.4%    05/07/2018:   C2 weekly taxol 80 mg/m2 D1,8,15, Carboplatin AUC 6 D1 q 21 days. PCEs on thurs, txt on fridays. EXAMS ON EVEN CYCLES     CA 125: 162.2    05/20/2018:   Early PCE due to scheduling, due 6/21    05/28/2018  Delay C3 due to low ANC     CA 125: 25    06/04/2018  C3 weekly taxol 80 mg/m2 D1,8,15, Carboplatin AUC 6 D1 q 21 days. EXAMS ON EVEN CYCLES.      CA 125    06/25/2018:   HOLD chemo due to low anc (1047)- defer to next week, and change to q 28 days cycle to build in an off week to allow for counts to recover     CA 125: 10.6    07/02/2018:   C4 weekly taxol 80 mg/m2 D1,8,15, Carboplatin AUC 6 D1 q 28 days. EXAMS ON EVEN CYCLES.  Review her genetics testing results      CA 125 7.2    07/30/2018:  C5 weekly taxol 80 mg/m2 D1,8,15, Carboplatin AUC 6 D1 q 28 days. Neulasta Day 16      EXAMS ON EVEN CYCLES.      CA 125: 11.4    08/27/2018:   C6 weekly taxol 80 mg/m2 D1,8,15, Carboplatin AUC 6 D1 q 28 days. Neulasta Day 16. Order Ct CAP. RTC 10/11 (3 weeks)     EXAMS ON EVEN CYCLES. Discussed option of maintenance  parp I- pt not decided (doesn't want to feel tired anymore)     CA 125:  8.7       05/28/2018 Tumor Markers    Tumor markers tested:  CA 125.  Results:   25.     06/25/2018 Tumor Markers    Tumor markers tested:  CA 125.  Results:   10.6.     07/02/2018 Tumor Markers    Tumor markers tested:  CA 125.  Results:   7.2.     07/07/2018 Hospital Admission    Admit date: 7/31 - 07/13/18  Admission diagnosis: Partial SBO  Additional comments: On 7/31 she experienced abdominal pain at home that was sharp on her LLQ and had significant diarrhea and one episode of emesis. She came to be evaluated at the [place] clinic. She has a history of prior small bowel obstruction. She was give IL NS, 2mg  morphine IV, and 4mg  Zofran IV in clinic and admitted for further observation. The abdominal XR showed dilated bowel loops, concerning for obstruction. CT abdomen/pelvis showed dilation of the small bowel with transition point seen in the anterior mid abdomen near the midline.     07/07/2018 Imaging    Imaging Completed:  X-ray of  abdomen  Result: Nonspecific bowel gas pattern. The presence of multiple air-fluid levels in small bowel raises question of early or partial small bowel obstruction.     07/07/2018 Imaging    CTAP:Small bowel obstruction with transition point in the anterior mid abdomen near the midline. Small amount of free fluid in the abdomen, which is nonspecific. 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  Peritoneum: Small amount of free fluid in the abdomen. A para-aortic density with central low attenuation measures 2.5 x 2 cm and is significantly decreased in size from prior.       07/30/2018 Tumor Markers    Tumor markers tested:  CA 125.  Results:   11.4.     08/27/2018 Tumor Markers    Tumor markers tested:  CA 125.  Results:   8.7.     09/20/2018 Procedure    Port removed: While the skin surface appeared inflamed, there was no evidence of infection in the port reservoir.       09/22/2018 Imaging    CT Chest-No evidence of  thoracic metastatic disease.  Suspected small seroma in the base of the right neck.  Necrotic metastatic lesion could also have this appearance but considered less likely.    [name]- Interval decrease in size of a left periaortic necrotic lymph node. No evidence of new metastatic disease.     10/15/2018 Imaging    Imaging Completed:  PET scan of  whole body  Result: Resolution of the previous hypermetabolic para-aortic lymph nodes and hepatic dome lesions.  Hypermetabolic focus within the left thyroid lobe.  Consider further evaluation with ultrasound, if clinically indicated.       10/19/2018 Imaging    Imaging Completed:  Ultrasound of  neck  Result: 1.2 cm irregular hypoechoic nodule in the left thyroid lobe is suspicious. Recommend FNA for further evaluation.  1.0 cm nodule right thyroid lobe containing macrocalcification is indeterminate. Recommend follow-up thyroid ultrasound in one year to evaluate for stability.     10/21/2018 Tumor Markers    Tumor markers tested:  CA 125.  Results:   4.8.     11/12/2018 Tumor Markers    Tumor markers tested:  CA 125  Results:   5.5     11/12/2018 - 12/03/2018 Systemic Therapy (Oral and IV)    11/12/2018: C1 rubraca 600 mg po BID. RTC 2 weeks for labs (CBC, CMP, 4 weeks for PCE #2). Pt plans to go to [place] and may leave before PCE and re-est care at [facility] (Dr. Edison Nasuti). Referral to ENT placed (pt cancelled appt for 11/22) and FNA was prev ordered for thyroid nodule- but has not been done.        03/02/2019 Imaging    CT Scan from [facility]    No evidence for recurrent or new neoplasm. Increased colonic stool could indicate constipation. Postoperative changes     06/13/2019 Tumor Markers    Tumor markers tested: CA 125 Results:   5.0.     09/15/2019 Tumor Markers    CA 125-6.1     05/31/2020 Tumor Markers    Ca 125-5.9     08/16/2020 Tumor Markers    CA 125-6.2     02/06/2021 Tumor Markers    CA 125-4.6     08/25/2022 Imaging    CT a/p: 2.6 cm lesion along the dome of the  right hepatic lobe may represent a peritoneal lesion along the undersurface of the diaphragm rather than a true hepatic parenchymal lesion. 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     CT chest: No evidence of intrathoracic metastatic disease.      08/29/2022 Imaging    PET: 1.  Two FDG avid foci at the superior surface of right hepatic lobe, favored to be peritoneal nodules along the undersurface of right hemidiaphragm, concerning for metastases.      09/15/2022 Procedure    09/15/2022:   scheduled for Power Port Insertion with Dr. Redmond Pulling      09/16/2022 -  Systemic Therapy (Oral and IV)    09/16/2022:   C1 carboplatin AUC4, gem 1000 mg/m2 (D1/8), Bev D1 q 21 days  Treatment Summary   Treatment goal Disease control   Plan Name OP Gyn CARBOplatin AUC 4 / Gemcitabine / Bevacizumab   Status Active   Start Date 09/16/2022   End Date 02/26/2023 (Planned)   Provider Desmond Lope, MD   Chemotherapy bevacizumab (AVASTIN) 850 mg in sodium chloride 0.9 % 100 mL chemo infusion, 15 mg/kg = 850 mg, Intravenous, Once, 4 of 8 cycles  Administration: 850 mg (09/16/2022), 850 mg (10/16/2022), 850 mg (11/06/2022), 850 mg (11/27/2022)  gemcitabine (GEMZAR) 1,600 mg in sodium chloride 0.9 % 100 mL chemo infusion, 1,000 mg/m2 = 1,600 mg, Intravenous, Once, 4 of 8 cycles  Administration: 1,600 mg (09/16/2022), 1,600 mg (09/23/2022), 1,600 mg (10/16/2022), 1,600 mg (10/23/2022), 1,600 mg (11/06/2022), 1,600 mg (11/13/2022), 1,600 mg (11/27/2022), 1,600 mg (12/04/2022)            09/16/2022 Tumor Markers    Tumor markers tested:  CA 125  Results:   7.5     10/07/2022 Tumor Markers    CA125: 7.9     10/16/2022 Tumor Markers    CA 125: 7.9     11/06/2022 Tumor Markers    CA 125: 11.1     11/17/2022 Imaging    Imaging Completed:  CT Scan of  chest, abdomen, and pelvis  AP: Interval decrease in size of the peritoneal nodule in the dome of the right  hepatic lobe. No evidence of new metastatic disease.   CHEST: No evidence of intrathoracic metastatic disease.  11/27/2022 Tumor Markers    CA 125: 11.4       Past Medical History  She  has a past medical history of Bowel obstruction (CMS-HCC) (2018), Ovarian cancer (CMS-HCC) (2014), and Thyroid disease.  Past Medical History:   Diagnosis Date    Bowel obstruction (CMS-HCC) 2018    Ovarian cancer (CMS-HCC) 2014    Thyroid disease      Past Surgical History  Past Surgical History:   Procedure Laterality Date    ABDOMINOPLASTY  1999    BLEPHAROPLASTY  2000    DILATION AND CURETTAGE OF UTERUS  [place] VASCULAR ACCESS N/A 09/15/2022    Procedure: INSERTION POWER PORT A CATH;  Surgeon: Cyndi Bender, MD;  Location: [facility] OR;  Service: General;  Laterality: N/A;  Left Subclavian    KNEE ARTHROPLASTY Right     ROTATOR CUFF REPAIR      TRANSUMBILICAL AUGMENTATION MAMMAPLASTY      VAGINAL HYSTERECTOMY  1979     Family History  History reviewed. No pertinent family history.    Family cancer history for ovarian, uterine and colon cancer is negative other than above.     Social History  Social History     Socioeconomic History    Marital status: Married     Spouse name: None    Number of children: None    Years of education: None    Highest education level: None   Occupational History    None   Tobacco Use    Smoking status: Some Days     Packs/day: 0     Types: Cigarettes    Smokeless tobacco: Never    Tobacco comments:     Smokes 1 pack every three days    Vaping Use    Vaping Use: Never used   Substance and Sexual Activity    Alcohol use: Yes     Alcohol/week: 14.0 standard drinks of alcohol     Types: 14 Shots of liquor per week    Drug use: No    Sexual activity: Not Currently   Other Topics Concern    Caffeine Use Yes    Occupational Exposure No    Exercise Yes    Seat Belt Yes   Social History Narrative    Mammogram-6 years ago and it was normal. Pt stated  she isn't getting them anymore.      Colonoscopy-10 years ago        Pt reports she can lie flat in bed without SOB.    Pt states she can walk a flight of stairs and city block without chest pain and SOB>       Social Determinants of Health     Financial Resource Strain: Not on file   Food Insecurity: No Food Insecurity (11/21/2022)    Hunger Vital Sign     Worried About Running Out of Food in the Last Year: Never true     Ran Out of Food in the Last Year: Never true   Transportation Needs: No Transportation Needs (11/21/2022)    PRAPARE - Armed forces logistics/support/administrative officer (Medical): No     Lack of Transportation (Non-Medical): No   Physical Activity: Not on file   Stress: Not on file   Social Connections: Not on file   Intimate Partner Violence: Not At Risk (11/21/2022)    Humiliation, Afraid,  Rape, and Kick questionnaire     Fear of Current or Ex-Partner: No     Emotionally Abused: No     Physically Abused: No     Sexually Abused: No   Housing Stability: Low Risk  (11/21/2022)    Housing Stability Vital Sign     Unable to Pay for Housing in the Last Year: No     Number of Places Lived in the Last Year: 1     Unstable Housing in the Last Year: No     Past OB/GYN History  G3P2  She reports menarche at age 39 and menopause at age 75-surgical.  She denies a history of STIs.  She denies a history of abnormal cervical cytology and reports her last cytologic examination she is unsure of. She has taken HRT.     Health maintenance:  Mammogram: Date 6 years ago Results normal  Colonoscopy: Date 10 years ago Results normal    Allergies  She is allergic to nsaids (non-steroidal anti-inflammatory drug), sulfamethoxazole-trimethoprim, celecoxib, and metoclopramide.    BMI  Body mass index is 21.83 kg/m.    Disease Status: No evidence of disease/remission (08/21/2022 12:27 PM)              Histories  She has a past medical history of Bowel obstruction (CMS-HCC) (2018), Ovarian cancer (CMS-HCC) (2014), and Thyroid  disease.    She has a past surgical history that includes Dilation and curettage of uterus (1979); Vaginal hysterectomy (1979); Elbow surgery (1990); Eye surgery (1994); Abdominoplasty (1999); Blepharoplasty (2000); Transumbilical augmentation mammaplasty; Rotator cuff repair; Knee Arthroplasty (Right); and insertion catheter vascular access (N/A, 09/15/2022).    Her family history is not on file.    She reports that she has been smoking cigarettes. She has never used smokeless tobacco. She reports current alcohol use of about 14.0 standard drinks of alcohol per week. She reports that she does not use drugs.    Allergies  Nsaids (non-steroidal anti-inflammatory drug), Sulfamethoxazole-trimethoprim, Celecoxib, and Metoclopramide    Medications  Outpatient Encounter Medications as of 12/18/2022   Medication Sig Dispense Refill    apixaban (ELIQUIS) 5 mg Tab Take 1 tablet (5 mg total) by mouth 2 times a day. 180 tablet 1    ascorbic acid, vitamin C, (VITAMIN C) 1000 MG tablet Take 1 tablet (1,000 mg total) by mouth daily.      cetirizine 10 mg Cap Take 10 mg by mouth daily.      cholecalciferol, vitamin D3, (VITAMIN D3) 1000 units tablet Take 1 tablet (1,000 Units total) by mouth daily. 30 tablet 0    dexAMETHasone (DECADRON) 4 MG tablet Take 8 mg by mouth daily with food on day 2, 3, and 4 of chemotherapy cycle 18 tablet 4    GLUTATHIONE-L ORAL Take 1 teaspoonful by mouth daily.      IODINE ORAL Take 50 mg/day by mouth.      Lactobac no.41/Bifidobact no.7 (PROBIOTIC-10 ORAL) Take 1 capsule by mouth daily. [employer] brand      levothyroxine (SYNTHROID) 75 MCG tablet Take 1 tablet (75 mcg total) by mouth every morning before breakfast.      lisinopriL (PRINIVIL) 10 MG tablet Take 2 tablets (20 mg total) by mouth daily. 30 tablet 3    omeprazole (PRILOSEC) 20 MG capsule Take 1 capsule (20 mg total) by mouth in the morning and at bedtime.      sodium chloride (OCEAN) 0.65 % nasal spray Use 1 spray into each nostril if  needed. 44 mL 0    UNABLE TO FIND Take 30 drops by mouth daily. Multimineral      UNABLE TO FIND Take 20 drops by mouth daily. Med Name: Murriel Hopper Drops      UNABLE TO FIND Take 2 capsules by mouth daily. Med Name: Body Bio      vit b complex w-b 12 tablet Take 1 tablet by mouth daily.      ZINC CITRATE ORAL Take 1 tablet by mouth daily.      [DISCONTINUED] apixaban (ELIQUIS) 2.5 mg Tab Take 1 tablet (2.5 mg total) by mouth 2 times a day for 30 days. 60 tablet 0    [DISCONTINUED] apixaban (ELIQUIS) 5 mg Tab Take 1 tablet (5 mg total) by mouth 2 times a day. 180 tablet 1     Facility-Administered Encounter Medications as of 12/18/2022   Medication Dose Route Frequency Provider Last Rate Last Admin    heparin lock flush 100 unit/mL syringe 500 Units  500 Units Intravenous Daily PRN Desmond Lope, MD        sodium chloride 0.9% flush 10 mL  10 mL Intravenous Daily PRN Desmond Lope, MD          Review of Systems  see HPI    Vitals  Blood pressure 148/81, pulse 89, temperature 97.4 F (36.3 C), temperature source Temporal, resp. rate 16, height 5' 4 (1.626 m), weight 127 lb 3.2 oz (57.7 kg), SpO2 100%.    Physical Exam   Vitals reviewed.  Constitutional: She is oriented to person, place, and time. She appears well-developed.   HENT:   Head: Atraumatic.   Neck: No thyromegaly present.   Cardiovascular: Normal rate.   Pulmonary/Chest: Effort normal.   Abdominal: Normal appearance.   Genitourinary:    Genitourinary Comments: def     Musculoskeletal:         General: Normal range of motion.      Cervical back: Normal range of motion.   Neurological: She is alert and oriented to person, place, and time.   Skin: Skin is warm and dry.   Psychiatric: Her behavior is normal.        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     Posterior cul de sac biopsy-metastatic adenocarcinoma.     Omentectomy- metastatic adenocarcinoma     LND-negative     Residual right IP ligament, Anterior cul de sac biopsy, right pelvic side will biopsy, left pelvic side wall, right gutter and left gutter-->benign    04/06/2017:  CT [name]- mild small bowel obstruction with transition point in the left pelvis adjacent to the anterior abdominal wall. Low density in the common femoral veins bilaterally thought likely to represent flow artificat, may consider follow up with lomer extremity Doppler to ensure there is no underlying deep venous thrombosis.    01/29/2018:   CTAP:  Interval development of necrotic retroperitoneal lymphadenopathy with largest node measuring 4.9  4.2 x 4.8 cm. Origin is indeterminate.  Options would include CT directed biopsy as well as PET/CT.    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  No other focal areas of FD avid malignancy are  found. Specifically, no lymph node activity is found outside of the retroperitoneum and there is no evidence for FDG avid pulmonary, skeletal or hepatic parenchymal metastasis.    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 No retroperitoneal, intra-abdominal, pelvic or inguinal adenopathy, no evidence for peritoneal surface neoplastic involvement. 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 She completed 6 cycles of chemotherapy with Carboplatin and Paclitaxel-weekly (C6D1 was 08/27/18). CA 125 was 8.7 at C6.     She underwent CT CAP on 09/22/2018 revealed ned in chest and interval decrease in size of a left periaortic necrotic lymph node from 1.8 to 1.6 cm. 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     PET on 10/15/18 shows reassuring intraabdominal findings with resolution of the previous hypermetabolic para-aortic lymph nodes and hepatic dome lesions, but did note uptake within the thyroid. She underwent thyroid US on 10/20/18 with a 1.2 cm irregular hypoechoic nodule in the left thyroid lobe is suspicious. Recommend FNA for further evaluation.      She initiated maintenance rubraca 11/2018 but ultimately elected to stop in 02/2019 due to side effects.   Patient had reestarted drug at 500 mg po BID once she got down to [place]-. Was in [place] from 12/2018-05/2019. Reports her oncologist down in [place] had changed doses of Rubraca a few times, to 300mg  po BID but decided to stop drug all together back in 02/2019 2/2 to decreased QOL. She does not wish to restart drug at this time. Last CT scan 03/02/2019 in [place], no  evidence of recurrent or new neoplasm.     She was seen for surveillance 08/21/2022 with an increase in her CA 125 to 8.5 - CT CAP and PET were checked and consistent with platinum sensitive recurrence #2.     At this time, she has decided to proceed with chemotherapy.    She underwent repeat CTs following C3 on 11/17/22 which showed partial response with interval decrease in size of the peritoneal nodule in the dome of the right hepatic lobe NED in the chest.     She is seen today for PCE Prior to C5 of Carboplatin/Gemcitabine/bevacizumab. Neulasta added to C2D9, (planned 11/27/22).  Of note, patient with new insurance that has not approved current chemo plan, unable to proceed with C5 today. Will update patient when she is approved for C5.    She is tolerating chemotherapy appropriately well, and has experienced expected but manageable side effects. I have reviewed her labs and she is cleared chemotherapy. There is no evidence of progressive disease or excessive toxicity today, and we will continue treatment without dose modification once she is approved. We will see her back for her next cycle of treatment, and she knows to call if questions or problems arise.     12/18/2022:   No chemo due to insurance change, RTC asap once approved, inc eliquis back up to full 5mg  dose      CA 125: 9.8    12/2022:   C5 carboplatin AUC4, gem 1000 mg/m2 (D1/8), Bev D1 q 21 days. 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 Neulasta D9 (started C2), LUE/neck  DVTs 10/2022- on eliquis. CTs after C6, plan bev maintenance   CA 125:     __ CA 125 stable 9.8 today (11.4 11/27/22)  __ for C5 infusion when approved by insurance  __ RTC in 4 wks for C6 (after C5)  __ repeat CT scans after C6  __ENT referral placed for nose bleeds  __Neulasta added to D9 C2  __Lisinopril added for HTN 10/07/2022  __new LUE/neck DVT 12/2021- on eliquis- see below    VTE:  -Patient was admitted from 11/12-11/13/23 to the hospital for extensive LUE/shoulder/neck DVT and was started on a heparin gtt and discharged home on eliquis. Per vascular surgery, there were no acute interventions needed besides anticoagulation. She was on eliquis 10 mg BID for 7 days (11/13-11/19) followed by eliquis 5mg  BID for 6 months. Per surgical oncology, she only needs repeat imaging if the swelling gets worse while she is on [facility] or if the port becomes non-functional. Clot burden on exam is improved  - had two weeks of multiple nose bleeds on the eliquis but stopped taking the herbs that might have been interacting with her eliquis and that stopped her nose bleeds   __She is going to continue on her bev  __Referred to ENT now, no current nose bleed    Patient was admitted 11/21/22-11/24/22 for management of anemia and leukocytosis. She required plt transfusion during admission. Patient was discharged with instructions to restart eliquis 2.5mg  BID (prophylactic dosing) until repeat CBC with Plt > 50, at which time she can resume therapeutic dosing of eliquis 5mg  BID     Seen 12/18/22, compliant with eliquis 2.5mg  BID. CBC today Plt > 50, will resume therapeutic dosing of eliquis 5mg  BID   __ patient informed and eliquis 5mg  BID refilled 12/18/22    Thyroid Nodule:   Incidental finding on PET scan of hypermetabolic focus within left thyroid nodule. Ultrasound  performed on 10/20/18 with 1.2 cm irregular hypoechoic nodule, appearing suspicious. Recommend FNA for further evaluation, which was ordered. Pt has not followed up to  date.  -OSH CT from 03/02/2019 showed decreased in thyroid nodule 14mm. Referral to ENT made, but patient did not see them. She followed up with her PCP and was started on Synthroid. Last TSH 5.91. Repeat TSH/T4/T3 labs wnl 11/2020     Genetics:  previously discussed her negative genetic testing results.    Pain    Pain Score: Zero (12/18/2022 10:23 AM)    She denies presence of pain.  Pain will be reassessed at next visit.           The HPI, ROS, physical exam, test results, and assessment & plan were reviewed and copied forward (with edits) from a note written by Dr. Windy Kalata on 11/27/22. I have reviewed and updated the history, physical exam, data, assessment, and plan of the note so that it reflects my evaluation and management of the patient.    Dwan Bolt, MD  Obstetrics & Gynecology, PGY-2    I saw and personally examined the patient today with my resident. I discussed the findings and therapeutic plan with the patient. I repeated, reviewed and agree with the history of present illness, past medical histories, family history, social history, medication list, and allergies as listed. The review of systems is as noted above. My physical exam confirms the findings listed above. Review of labs, pathology reports, radiograph reports, and medical records confirm the findings noted above. I agree with the assessment and plan as noted above. I have edited the note where appropriate.     I have personally performed a face to face diagnostic evaluation on this patient, seen initially by the resident. I have personally developed the care plan as outlined in the Assessment and Plan.     This note was completely edited, written and reviewed by me and consists of information cut and pasted from the my most recent visit, my smart phrases and other Epic tools. I have personally reviewed all aspects of this note to at least include reviewing this patient's chart and problem list, updating the history, physical exam, lab and  procedure results, and assessment and plan as detailed above and below. As such this visit note reflects my current evaluation and management for this patient.    This note was copied forward from the note written by me on 11/27/22. I have reviewed and updated the history, physical exam, data, assessment and plan of the note so that it reflects the evaluation and management of the patient on 12/18/22.        Complexity: high     I spent a total of 45 minutes was spent prior to, during and after the patient encounter in counseling and/or coordination of care, documentation and counseling with patient and/or family.      Topics discussed include rec ov  Cancer prognosis and treatment options and management of chemo.  12/18/2022:   No chemo due to insurance change, RTC asap once approved, inc eliquis back up to full 5mg  dose      CA 125: 9.8      12/2022:   C5 carboplatin AUC4, gem 1000 mg/m2 (D1/8), Bev D1 q 21 days. 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 Neulasta D9 (started C2), LUE/neck DVTs 10/2022- on eliquis. CTs after C6, plan bev maintenance   CA 125:      Daniel Nones, MD, Cherlynn June  Associate Professor, Obstetrics and Gynecology  Division of Gynecologic Oncology    Medical Decision Making:  The following items were considered in medical decision making:  Review / order clinical lab tests  Review / order radiology tests  Review / order other diagnostic tests/interventions

## 2022-12-23 NOTE — Unmapped (Signed)
Spoke to patient, explained this was submitted to [facility] on 12/17/2022 and is still pending. Explained our precert team has submitted plan as urgent and are awaiting to hear back.

## 2022-12-23 NOTE — Unmapped (Signed)
Patient called in with her insurance company wanting to know if the PA for her chemo has been sent.  Philippa Chester from [employer] says that they have not received anything for the patient's chemo.  Philippa Chester and the patient were wondering if it had been sent and if it was they would like to know the day that it was submitted?  They would also like it to be sent again and said that it will take up to 2 weeks to get approval.  She would like a call back to see what is going on at [phone].

## 2022-12-26 NOTE — Unmapped (Signed)
Patient calling in, has spoken to her case manager at [facility] who continues to tell her that nothing has been submitted. Explained that this was submitted by our precert team 3/76/2831 as an urgent request. Follow up message sent to precert team, will update patient once I have gotten an update.    No future appointments.

## 2022-12-29 NOTE — Unmapped (Addendum)
Called and LVM with update on patient's approval. Per precert team, case was started via phone and they should have an answer within 24 hours.

## 2022-12-30 NOTE — Unmapped (Signed)
Patient called in and states that she received a call from her insurance stating she was authorized for treatment. Advised that I would call her back later today to set up for treatment once precert updates the approval. Pt amenable and appreciative of teams efforts on this.    Called and updated patient on upcoming appts:   Future Appointments   Date Time Provider [place]   01/01/2023  1:30 PM PORT CHAIR 2 BC3 [facility] PORT 3 BC [facility]   01/01/2023  2:00 PM CHAIR 08 INF 3 BC [facility] INF3 BC [facility]   01/22/2023  9:30 AM PORT CHAIR 2 BC3 [facility] PORT 3 [facility]   01/22/2023 10:00 AM Kandice Hams, CNP [facility] GYN3 [facility]

## 2023-01-01 ENCOUNTER — Other Ambulatory Visit: Admit: 2023-01-01 | Payer: Medicare (Managed Care)

## 2023-01-01 ENCOUNTER — Institutional Professional Consult (permissible substitution): Admit: 2023-01-01 | Payer: Medicare (Managed Care)

## 2023-01-01 ENCOUNTER — Ambulatory Visit: Admit: 2023-01-01 | Payer: Medicare (Managed Care)

## 2023-01-01 DIAGNOSIS — Z452 Encounter for adjustment and management of vascular access device: Secondary | ICD-10-CM

## 2023-01-01 DIAGNOSIS — C569 Malignant neoplasm of unspecified ovary: Secondary | ICD-10-CM

## 2023-01-01 LAB — DIFFERENTIAL
Basophils Absolute: 99 /uL (ref 0–200)
Basophils Relative: 1.5 % — ABNORMAL HIGH (ref 0.0–1.0)
Eosinophils Absolute: 46 /uL (ref 15–500)
Eosinophils Relative: 0.7 % (ref 0.0–8.0)
Lymphocytes Absolute: 1518 /uL (ref 850–3900)
Lymphocytes Relative: 23 % (ref 15.0–45.0)
Monocytes Absolute: 739 /uL (ref 200–950)
Monocytes Relative: 11.2 % (ref 0.0–12.0)
Neutrophils Absolute: 4198 /uL (ref 1500–7800)
Neutrophils Relative: 63.6 % (ref 40.0–80.0)
nRBC: 0 /100{WBCs} (ref 0–0)

## 2023-01-01 LAB — CBC
Hematocrit: 30.9 % (ref 35.0–45.0)
Hemoglobin: 10.4 g/dL (ref 11.7–15.5)
MCH: 33.7 pg (ref 27.0–33.0)
MCHC: 33.7 g/dL (ref 32.0–36.0)
MCV: 100 fL (ref 80.0–100.0)
MPV: 7.8 fL (ref 7.5–11.5)
Platelets: 212 10*3/uL (ref 140–400)
RBC: 3.09 10*6/uL (ref 3.80–5.10)
RDW: 17.1 % (ref 11.0–15.0)
WBC: 6.6 10*3/uL (ref 3.8–10.8)

## 2023-01-01 LAB — COMPREHENSIVE METABOLIC PANEL
ALT: 17 U/L (ref 7–52)
AST: 23 U/L (ref 13–39)
Albumin: 4.4 g/dL (ref 3.5–5.7)
Alkaline Phosphatase: 79 U/L (ref 36–125)
Anion Gap: 7 mmol/L (ref 3–16)
BUN: 18 mg/dL (ref 7–25)
CO2: 27 mmol/L (ref 21–33)
Calcium: 9.6 mg/dL (ref 8.6–10.3)
Chloride: 100 mmol/L (ref 98–110)
Creatinine: 0.78 mg/dL (ref 0.60–1.30)
EGFR: 76
Glucose: 143 mg/dL (ref 70–100)
Osmolality, Calculated: 282 mOsm/kg (ref 278–305)
Potassium: 4.6 mmol/L (ref 3.5–5.3)
Sodium: 134 mmol/L (ref 133–146)
Total Bilirubin: 0.4 mg/dL (ref 0.0–1.5)
Total Protein: 6.7 g/dL (ref 6.4–8.9)

## 2023-01-01 LAB — URINALYSIS W/RFL TO MICROSCOPIC
Bilirubin, UA: NEGATIVE
Blood, UA: NEGATIVE
Glucose, UA: NEGATIVE mg/dL
Ketones, UA: NEGATIVE mg/dL
Nitrite, UA: NEGATIVE
Protein, UA: NEGATIVE mg/dL
Specific Gravity, UA: 1.02 (ref 1.005–1.035)
Urobilinogen, UA: <2 mg/dL (ref 0.2–1.9)
pH, UA: 6 (ref 5.0–8.0)

## 2023-01-01 LAB — CA 125: CA 125: 8.2 U/mL (ref 5.5–35.0)

## 2023-01-01 LAB — MAGNESIUM: Magnesium: 1.7 mg/dL (ref 1.5–2.5)

## 2023-01-01 LAB — PROTEIN / CREATININE RATIO, URINE
Creatinine, Urine: 102.7 mg/dL
Prot/Creat Ratio, Ur: 0.08 ratio
Total Protein, Ur: 8 mg/dL

## 2023-01-01 MED ORDER — hydrocortisone sod succ (PF) (SOLU-CORTEF) injection 100 mg
100 | Freq: Every day | INTRAMUSCULAR | PRN
Start: 2023-01-01 — End: 2023-01-01

## 2023-01-01 MED ORDER — diphenhydrAMINE (BENADRYL) injection 25 mg
50 | Freq: Every day | INTRAMUSCULAR | PRN
Start: 2023-01-01 — End: 2023-01-01

## 2023-01-01 MED ORDER — sodium chloride 0.9% flush 10 mL
Freq: Every day | INTRAMUSCULAR | PRN
Start: 2023-01-01 — End: 2023-01-01
  Administered 2023-01-01: 22:00:00 10 mL via INTRAVENOUS

## 2023-01-01 MED ORDER — dexAMETHasone (DECADRON) tablet 12 mg
4 | Freq: Once | ORAL | Status: AC
Start: 2023-01-01 — End: 2023-01-01
  Administered 2023-01-01: 19:00:00 12 mg via ORAL

## 2023-01-01 MED ORDER — lisinopriL (PRINIVIL) 10 MG tablet
10 | ORAL_TABLET | Freq: Every day | ORAL | 3 refills | Status: AC
Start: 2023-01-01 — End: ?

## 2023-01-01 MED ORDER — aprepitant (CINVANTI) IV emulsion 130 mg
7.2 | Freq: Once | INTRAVENOUS | Status: AC
Start: 2023-01-01 — End: 2023-01-01
  Administered 2023-01-01: 19:00:00 130 mg via INTRAVENOUS

## 2023-01-01 MED ORDER — albuterol (PROVENTIL) 90 mcg/actuation inhaler 1-2 puff
90 | RESPIRATORY_TRACT | PRN
Start: 2023-01-01 — End: 2023-01-01

## 2023-01-01 MED ORDER — sodium chloride 0.9% flush 20 mL
Freq: Every day | INTRAMUSCULAR | Status: AC | PRN
Start: 2023-01-01 — End: 2023-01-02
  Administered 2023-01-01: 18:00:00 20 mL via INTRAVENOUS

## 2023-01-01 MED ORDER — CARBOplatin (PARAPLATIN) 300 mg in dextrose 5% in water (D5W) 250 mL chemo infusion
Freq: Once | INTRAVENOUS | Status: AC
Start: 2023-01-01 — End: 2023-01-01
  Administered 2023-01-01: 21:00:00 300 mg via INTRAVENOUS

## 2023-01-01 MED ORDER — heparin lock flush 100 unit/mL syringe 500 Units
100 | Freq: Every day | INTRAVENOUS | Status: AC | PRN
Start: 2023-01-01 — End: 2023-01-02

## 2023-01-01 MED ORDER — proCHLORPERazine (COMPAZINE) tablet 10 mg
10 | Freq: Four times a day (QID) | ORAL | PRN
Start: 2023-01-01 — End: 2023-01-01

## 2023-01-01 MED ORDER — bevacizumab (AVASTIN) 850 mg in sodium chloride 0.9 % 100 mL chemo infusion
Freq: Once | INTRAVENOUS | Status: AC
Start: 2023-01-01 — End: 2023-01-01
  Administered 2023-01-01: 21:00:00 850 mg/kg via INTRAVENOUS

## 2023-01-01 MED ORDER — gemcitabine (GEMZAR) 1,600 mg in sodium chloride 0.9 % 100 mL chemo infusion
Freq: Once | INTRAVENOUS | Status: AC
Start: 2023-01-01 — End: 2023-01-01
  Administered 2023-01-01: 20:00:00 1600 mg/m2 via INTRAVENOUS

## 2023-01-01 MED ORDER — heparin lock flush 100 unit/mL syringe 500 Units
100 | Freq: Every day | INTRAVENOUS | PRN
Start: 2023-01-01 — End: 2023-01-01
  Administered 2023-01-01: 22:00:00 500 [IU]

## 2023-01-01 MED ORDER — palonosetron (ALOXI) injection 0.25 mg
0.25 | Freq: Once | INTRAVENOUS | Status: AC
Start: 2023-01-01 — End: 2023-01-01
  Administered 2023-01-01: 19:00:00 0.25 mg via INTRAVENOUS

## 2023-01-01 MED ORDER — sodium chloride 0.9 % infusion
Freq: Every day | INTRAVENOUS | PRN
Start: 2023-01-01 — End: 2023-01-01

## 2023-01-01 MED ORDER — LORazepam (ATIVAN) tablet 0.5 mg
0.5 | Freq: Four times a day (QID) | ORAL | PRN
Start: 2023-01-01 — End: 2023-01-01

## 2023-01-01 MED ORDER — EPINEPHrine injection 0.3 mg
1 | Freq: Every day | INTRAMUSCULAR | PRN
Start: 2023-01-01 — End: 2023-01-01

## 2023-01-01 MED FILL — DEXAMETHASONE 4 MG TABLET: 4 4 MG | ORAL | Qty: 3

## 2023-01-01 MED FILL — AVASTIN 25 MG/ML INTRAVENOUS SOLUTION: 25 25 mg/mL | INTRAVENOUS | Qty: 34

## 2023-01-01 MED FILL — GEMCITABINE 200 MG INTRAVENOUS SOLUTION: 200 200 mg | INTRAVENOUS | Qty: 42.08

## 2023-01-01 MED FILL — PALONOSETRON 0.25 MG/5 ML INTRAVENOUS SOLUTION: 0.25 0.25 mg/5 mL | INTRAVENOUS | Qty: 5

## 2023-01-01 MED FILL — CARBOPLATIN 10 MG/ML INTRAVENOUS SOLUTION: 10 10 mg/mL | INTRAVENOUS | Qty: 30

## 2023-01-01 MED FILL — CINVANTI 7.2 MG/ML INTRAVENOUS EMULSION: 7.2 7.2 mg/mL | INTRAVENOUS | Qty: 18

## 2023-01-01 NOTE — Unmapped (Signed)
Arrived for cycle 5 day 1 Carboplatin/Gemzar/Avastin.  Meds and allergies reviewed with patient. Fall risk assessment completed.  Pt is not considered a fall risk.  Call light placed within reach of patient.  Lab results reviewed are within treatment parameters.  Treatment administered as ordered.  Tolerated infusion well and left department ambulatory.  She declined the AVS.  Future infusion appointments reviewed with patient prior to DC.  Infusion appointment confirmed for 01/08/23.

## 2023-01-01 NOTE — Unmapped (Signed)
Refill sent with new quantity updated

## 2023-01-01 NOTE — Unmapped (Signed)
Patient called to let us know she is out of her Lisinopril her dose was increased to two pills a day and the prescription did not reflect and pharmacy is declining refill. Patient is out of med requesting call back for next steps

## 2023-01-01 NOTE — Unmapped (Signed)
Stella accessed for labs prior to treatment. Port site appears unremarkable;pt denies any complaints with port. Urine collected for specimens and sent to lab.      01/01/23 1305   Specimen Collection Status   Specimen Collection Unit   Port A Cath Left Chest   No placement date or time found.   Orientation: Left  Location: Chest   Line Intervention Accessed   Site Assessment No problems identified   Line Status Blood return noted;Flushed   Needle Size 20 gauge, 0.75 huber   Dressing Type Transparent   Dressing Intervention New dressing   Accessed/Deaccessed by: SN   Access Attempts 1   Flush Performed Yes

## 2023-01-01 NOTE — Unmapped (Signed)
This RN notified patient of new prescription.  Patient verbalized understanding.

## 2023-01-05 ENCOUNTER — Institutional Professional Consult (permissible substitution): Admit: 2023-01-05 | Payer: Medicare (Managed Care)

## 2023-01-05 DIAGNOSIS — C801 Malignant (primary) neoplasm, unspecified: Secondary | ICD-10-CM

## 2023-01-05 NOTE — Unmapped (Signed)
Patient was present for virtual movement class via [employer].

## 2023-01-08 ENCOUNTER — Other Ambulatory Visit: Admit: 2023-01-08 | Payer: Medicare (Managed Care)

## 2023-01-08 ENCOUNTER — Ambulatory Visit: Admit: 2023-01-08 | Payer: Medicare (Managed Care)

## 2023-01-08 ENCOUNTER — Institutional Professional Consult (permissible substitution): Admit: 2023-01-08 | Payer: Medicare (Managed Care)

## 2023-01-08 DIAGNOSIS — C569 Malignant neoplasm of unspecified ovary: Secondary | ICD-10-CM

## 2023-01-08 DIAGNOSIS — Z452 Encounter for adjustment and management of vascular access device: Secondary | ICD-10-CM

## 2023-01-08 LAB — DIFFERENTIAL
Basophils Absolute: 95 /uL (ref 0–200)
Basophils Relative: 2.5 % (ref 0.0–1.0)
Eosinophils Absolute: 38 /uL (ref 15–500)
Eosinophils Relative: 1 % (ref 0.0–8.0)
Lymphocytes Absolute: 1338 /uL (ref 850–3900)
Lymphocytes Relative: 35.2 % (ref 15.0–45.0)
Monocytes Absolute: 80 /uL (ref 200–950)
Monocytes Relative: 2.1 % (ref 0.0–12.0)
Neutrophils Absolute: 2250 /uL (ref 1500–7800)
Neutrophils Relative: 59.2 % (ref 40.0–80.0)
nRBC: 0 /100 WBC (ref 0–0)

## 2023-01-08 LAB — COMPREHENSIVE METABOLIC PANEL
ALT: 24 U/L (ref 7–52)
AST: 25 U/L (ref 13–39)
Albumin: 4.2 g/dL (ref 3.5–5.7)
Alkaline Phosphatase: 72 U/L (ref 36–125)
Anion Gap: 6 mmol/L (ref 3–16)
BUN: 13 mg/dL (ref 7–25)
CO2: 28 mmol/L (ref 21–33)
Calcium: 9 mg/dL (ref 8.6–10.3)
Chloride: 101 mmol/L (ref 98–110)
Creatinine: 0.69 mg/dL (ref 0.60–1.30)
EGFR: 87
Glucose: 150 mg/dL — ABNORMAL HIGH (ref 70–100)
Osmolality, Calculated: 283 mosm/kg (ref 278–305)
Potassium: 4.4 mmol/L (ref 3.5–5.3)
Sodium: 135 mmol/L (ref 133–146)
Total Bilirubin: 0.3 mg/dL (ref 0.0–1.5)
Total Protein: 6.3 g/dL — ABNORMAL LOW (ref 6.4–8.9)

## 2023-01-08 LAB — CBC
Hematocrit: 27.6 % (ref 35.0–45.0)
Hemoglobin: 9.6 g/dL (ref 11.7–15.5)
MCH: 34.3 pg (ref 27.0–33.0)
MCHC: 34.7 g/dL (ref 32.0–36.0)
MCV: 99 fL (ref 80.0–100.0)
MPV: 7.6 fL (ref 7.5–11.5)
Platelets: 136 10*3/uL (ref 140–400)
RBC: 2.79 10*6/uL (ref 3.80–5.10)
RDW: 16.4 % (ref 11.0–15.0)
WBC: 3.8 10*3/uL (ref 3.8–10.8)

## 2023-01-08 LAB — MAGNESIUM: Magnesium: 1.6 mg/dL (ref 1.5–2.5)

## 2023-01-08 MED ORDER — LORazepam (ATIVAN) tablet 1 mg
1 | Freq: Four times a day (QID) | ORAL | PRN
Start: 2023-01-08 — End: 2023-01-08

## 2023-01-08 MED ORDER — sodium chloride 0.9 % infusion
Freq: Every day | INTRAVENOUS | PRN
Start: 2023-01-08 — End: 2023-01-08
  Administered 2023-01-08: 19:00:00 25 mL/h via INTRAVENOUS

## 2023-01-08 MED ORDER — heparin lock flush 100 unit/mL syringe 500 Units
100 | Freq: Every day | INTRAVENOUS | Status: AC | PRN
Start: 2023-01-08 — End: 2023-01-09

## 2023-01-08 MED ORDER — sodium chloride 0.9% flush 20 mL
Freq: Every day | INTRAMUSCULAR | Status: AC | PRN
Start: 2023-01-08 — End: 2023-01-09
  Administered 2023-01-08: 18:00:00 20 mL via INTRAVENOUS

## 2023-01-08 MED ORDER — dexAMETHasone (DECADRON) tablet 12 mg
4 | Freq: Once | ORAL | Status: AC
Start: 2023-01-08 — End: 2023-01-08
  Administered 2023-01-08: 20:00:00 12 mg via ORAL

## 2023-01-08 MED ORDER — gemcitabine (GEMZAR) 1,600 mg in sodium chloride 0.9 % 100 mL chemo infusion
200 | Freq: Once | INTRAVENOUS | Status: AC
Start: 2023-01-08 — End: 2023-01-08
  Administered 2023-01-08: 20:00:00 1600 mg/m2 via INTRAVENOUS

## 2023-01-08 MED ORDER — LORazepam (ATIVAN) injection 1 mg
2 | Freq: Four times a day (QID) | INTRAMUSCULAR | PRN
Start: 2023-01-08 — End: 2023-01-08

## 2023-01-08 MED ORDER — pegfilgrastim (NEULASTA Kit) delayed injection 6 mg
6 | Freq: Once | SUBCUTANEOUS | Status: AC
Start: 2023-01-08 — End: 2023-01-08
  Administered 2023-01-08: 21:00:00 6 mg via SUBCUTANEOUS

## 2023-01-08 MED ORDER — sodium chloride 0.9% flush 10 mL
Freq: Every day | INTRAMUSCULAR | PRN
Start: 2023-01-08 — End: 2023-01-08
  Administered 2023-01-08: 21:00:00 10 mL via INTRAVENOUS

## 2023-01-08 MED ORDER — heparin lock flush 100 unit/mL syringe 500 Units
100 | Freq: Every day | INTRAVENOUS | PRN
Start: 2023-01-08 — End: 2023-01-08
  Administered 2023-01-08: 21:00:00 500 [IU]

## 2023-01-08 MED FILL — NEULASTA ONPRO 6 MG/0.6 ML WITH WEARABLE SUBCUTANEOUS INJECTOR: 6 6 mg/0.6 mL | SUBCUTANEOUS | Qty: 0.6

## 2023-01-08 MED FILL — GEMCITABINE 200 MG INTRAVENOUS SOLUTION: 200 200 mg | INTRAVENOUS | Qty: 42.08

## 2023-01-08 MED FILL — DEXAMETHASONE 4 MG TABLET: 4 4 MG | ORAL | Qty: 3

## 2023-01-08 NOTE — Unmapped (Signed)
[place] accessed for labs prior to treatment. Port site appears unremarkable;pt denies any complaints with port.      01/08/23 1310   Specimen Collection Status   Specimen Collection Unit   Port A Cath Left Chest   No placement date or time found.   Orientation: Left  Location: Chest   Line Intervention Accessed   Site Assessment No problems identified   Line Status Blood return noted;Flushed   Needle Size 20 gauge, 0.75 huber   Dressing Type Transparent   Dressing Intervention New dressing   Accessed/Deaccessed by: SN   Access Attempts 1   Flush Performed Yes

## 2023-01-08 NOTE — Unmapped (Signed)
Pt. here today for Day 8 Cycle 5 of gemzar and neulasta.  Informed consent verified.  Labs reviewed and within treatment parameters.  Pt. arrived with port having been accessed in the port room; positive blood return.  See MD/NP visit note for vital signs, allergy and medication review.  Pre-medications administered.  Pt tolerated treatment well with no adverse reactions.  Neulasta pod applied to abdomen.  Verified that pod had been activated prior to departure.  Reviewed neulasta pod instructions with pt and instruction booklet given to pt.  Pt verbalized understanding of instructions.  Chest port flushed with 10 ml of NS and locked with 5 ml of heparin 100 units/ml.  Positive blood return noted.  Chest port de-accessed and band-aid applied.  Pt given AVS and next appointment reviewed.  Pt leaves treatment area ambulatory with no issues.

## 2023-01-12 ENCOUNTER — Institutional Professional Consult (permissible substitution): Admit: 2023-01-12

## 2023-01-12 DIAGNOSIS — C801 Malignant (primary) neoplasm, unspecified: Secondary | ICD-10-CM

## 2023-01-12 NOTE — Telephone Encounter (Signed)
Patient called in and wants to cancel her appointment and infusion with [place] on 2/15 and change it to 2/22 to see Dr Windy Kalata before she leaves for [place].  I told her that I would send a message to make sure this was because I was not sure if infusion could be changed.

## 2023-01-12 NOTE — Progress Notes (Signed)
Patient was present for virtual movement class via [employer] Teams.

## 2023-01-14 NOTE — Telephone Encounter (Signed)
Patient called in and said she wants to see billinglsey not a NP before she goes to [facility] and wants to talk to a nurse about her transfer to [facility] requesting call back

## 2023-01-14 NOTE — Telephone Encounter (Signed)
RTC to patient to offer early PCE with Dr. Windy Kalata this week-- LVM with call back number and above info. If patient calls back, please schedule or you may transfer call to this RN.

## 2023-01-15 ENCOUNTER — Ambulatory Visit: Admit: 2023-01-15 | Discharge: 2023-01-15 | Payer: Medicare (Managed Care)

## 2023-01-15 ENCOUNTER — Institutional Professional Consult (permissible substitution): Admit: 2023-01-15 | Payer: Medicare (Managed Care)

## 2023-01-15 DIAGNOSIS — C569 Malignant neoplasm of unspecified ovary: Secondary | ICD-10-CM

## 2023-01-15 NOTE — Nursing Note (Signed)
Not seen in port room today. Closing encounter

## 2023-01-15 NOTE — Telephone Encounter (Signed)
Patient called in and wants to speak with a nurse.  She is very unhappy that no one has called her back.  She said that she has already missed chemo due to this.  She says she does not want to see the NP on 2/15 she wants to see Dr Windy Kalata.  She is going to [place] and she would like her records sent to her Dr in [place] she wants Dr. Lauris Poag notes and her chemo notes.  I told her I could send Dr. Lauris Poag last office visit but I could not send the chemo notes they are not my department that she needed to contact medical records or her doctors office in [place] needs to request them.  She got very upset and said that Dr Windy Kalata has always sent her records.  I told her I would send a message and someone would give her a call back.

## 2023-01-15 NOTE — Progress Notes (Signed)
History of Present Illness  Kelsey Sullivan is a 82 y.o. 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 She declined adjuvant therapy. In 01/2018, she experienced mid abdominal pain, and seen by PCP and ED, with CT of abdomen and pelvis showing interval development of necrotic retroperitoneal lymphadenopathy with largest node measuring 4.9  4.2 x 4.8 cm. Her CA 125 on 02/18/18 was 197.8.  She underwent a ct-guided nodal biopsy on 02/26/18, with pathology confirming recurrent disease.  She completed 6 cycles of chemotherapy with Carboplatin and Paclitaxel-weekly (C6D1 was 08/27/18). CA 125 was 8.7 at C6.     She underwent CT CAP on 09/22/2018 revealed ned in chest and interval decrease in size of a left periaortic necrotic lymph node from 1.8 to 1.6 cm. 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 PET on 10/15/18 shows reassuring intraabdominal findings with resolution of the previous hypermetabolic para-aortic lymph nodes and hepatic dome lesions, but did note uptake within the thyroid. She underwent thyroid US on 10/20/18 with a 1.2 cm irregular hypoechoic nodule in the left thyroid lobe is suspicious. Recommended FNA for further evaluation, but she did not get this done.    She initiated maintenance rubraca 11/2018 but ultimately elected to stop in 02/2019 due to side effects.     She was seen for surveillance 08/21/2022 with an increase in her CA 125  to 8.5 - CT CAP and PET were checked and consistent with platinum sensitive recurrence #2.   CT CAP on 08/25/2022 noted 2.6 cm lesion along the dome of the right hepatic lobe may represent a peritoneal lesion along the undersurface of the diaphragm rather than a true hepatic parenchymal lesion. 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   PET 08/29/2022 noted two FDG avid foci at the superior surface of right hepatic lobe, favored to be peritoneal nodules along the undersurface of right hemidiaphragm, concerning for metastases.     She proceeded on Carbo/gem/bev, C1 09/16/22. C2 was delayed to 10/16/2022 due to neutropenia, neulasta added for C2.    Patient was admitted from 11/12-11/13/23 to the hospital for extensive LUE/shoulder/neck DVT and was started on a heparin gtt and discharged home on eliquis. Per vascular surgery, there were no acute interventions needed besides anticoagulation. She was on eliquis 10 mg BID for 7 days (11/13-11/19) followed by eliquis 5mg  BID for 6 months. Per surgical oncology, she only needs repeat imaging if the swelling gets worse while she is on [facility] or if the port becomes non-functional.     She underwent CTs after C3 on 11/17/22 which showed decreased disease.     She was admitted 11/21/22-11/24/22 for management of anemia and leukocytosis. Throughout admission, patient had negative infectious workup with improving leukocytosis, remained afebrile. Hemoglobin stable at 9.1 on discharge. Patient was discharged with instructions to restart  eliquis 2.5mg  BID (prophylactic dosing) until repeat CBC with Plt > 50, at which time she can resume therapeutic dosing of eliquis 5mg  BID (she is now on 5 mg again).    There are no changes to the cancer history HPI above.       She is seen today for problem visit.  Kelsey Sullivan is doing well. She reports that she plans to move to [facility] full time, and is  wanting to pursue her further treatment at [facility] cancer center.  Reports otherwise stable epistaxis.  She denies any current abdominal/pelvic pain.   She denies any bloating or fullness.  She reports normal bowel/bladder habits.  She denies any GU concerns. Denies any dysuria, frequency, hematuria, flank pain or fevers.  She denies any vaginal bleeding or discharge.   She denies neuropathy.   She reports no leg swelling.   Her appetite is good, and reports no nausea or vomiting.   She had some mild bone pain in hips following chemo, controlled with tylenol. Resolved now.  Compliant with Eliquis 5mg  BID.    There are no changes to the cancer history HPI above.       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    Omentum-biopsy for frozen section-metastatic adenocarcionma.  Residual right IP ligament-benign smooth muscle and fibroadiopse tissue containing thick walled blood vessels.    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  Anterior cul de sac biopsy-benign fibroadipose tissue.   Posterior cul de sac biopsy-metastatic adenocarcinoma.  Right pelvic side will biopsy- benign fibroadipose tissue.  Left pelvic side wall, right gutter and Left gutter-benign fibroadipose tissue.  Omentectomy- metastatic adenocarcinoma  LND-negative     12/28/2012 Surgery    BSO, omentectomy, LND     12/28/2012 Initial  Diagnosis    Malignant neoplasm of both ovaries (CMS Dx)     01/08/2013 - 01/08/2013 Systemic Therapy (Oral and IV)    Patient refused chemotherapy and did alternative treatment including a raw diet for 1 year.       04/06/2017 Imaging    CT [name]- mild small bowel obstruction with transition point in the left pelvis adjacent to the anterior abdominal wall. Low density in the common femoral veins bilaterally thought likely to represent flow artificat, may consider follow up with lomer extremity Doppler to ensure there is no underlying deep venous thrombosis.     01/29/2018 Imaging    CT [name]-  Interval development of necrotic retroperitoneal lymphadenopathy with largest node measuring 4.9  4.2 x 4.8 cm. Origin is indeterminate.  Options would include CT directed biopsy as well as PET/CT.     02/18/2018 Tumor Markers    CA 125:  197.8     02/26/2018 Biopsy    CT guided Left retroperitoneal node biopsy- focally involved by non-small cell malignancy- additional stains are diagnostic of non-small cell carcionoma and supportive  of involve60ment by the patients reported previous known ovarian malignancy.     03/04/2018 Imaging    PET/CT-  FDG avid retroperitoneal lymphadenopathy as described above and an FDG avid lesion over the dome of the liver most likely a peritoneal implant rather than a liver metastasis.  No other focal areas of FD avid malignancy are found. Specifically, no lymph node activity is found outside of the retroperitoneum and there is no evidence for FDG avid pulmonary, skeletal or hepatic parenchymal metastasis.     04/01/2018 Cancer Staged     Cancer Staging   Malignant neoplasm of ovary (CMS-HCC)  Staging form: Ovary, Fallopian Tube, And Primary Peritoneal Carcinoma, AJCC 8th Edition  - Clinical: FIGO Stage IIIC - Signed by Desmond Lope, MD on 04/01/2018         04/12/2018 Procedure    IR port placed       04/15/2018 Genetic Testing    Myraid Testing-Neg     04/16/2018 - 08/27/2018 Systemic Therapy (Oral and  IV)    Treatment Summary   Treatment goal Disease control   Plan Name OP Gyn CARBOplatin AUC 6 / PACLitaxel Weekly   Status Active   Start Date 04/16/2018   End Date 09/10/2018 (Planned)   Provider Desmond Lope, MD   Chemotherapy PACLitaxel (TAXOL) 120 mg in sodium chloride 0.9 % 250 mL chemo infusion, 80 mg/m2 = 120 mg, Intravenous, Once, 4 of 6 cycles    CARBOplatin (PARAPLATIN) 480 mg in dextrose 5% in water (D5W) 250 mL chemo infusion, 480 mg, Intravenous, Once, 4 of 6 cycles  Dose modification: 497 mg (original dose 484.8 mg, Cycle 2, Reason: Other (See Comments))         04/16/2018:   C1 weekly taxol 80 mg/m2 D1,8,15, Carboplatin AUC 6 D1 q 21 days. PCEs on thurs, txt on fridays. EXAMS ON EVEN CYCLES     CA 125: 523.4     Genetic testing: Neg for CS mutation, no VUS, lifetime remaining breast cancer risk -3.4%    05/07/2018:   C2 weekly taxol 80 mg/m2 D1,8,15, Carboplatin AUC 6 D1 q 21 days. PCEs on thurs, txt on fridays. EXAMS ON EVEN CYCLES     CA 125: 162.2    05/20/2018:   Early PCE due to scheduling, due 6/21    05/28/2018  Delay C3 due to low ANC     CA 125: 25    06/04/2018  C3 weekly taxol 80 mg/m2 D1,8,15, Carboplatin AUC 6 D1 q 21 days. EXAMS ON EVEN CYCLES.      CA 125    06/25/2018:   HOLD chemo due to low anc (1047)- defer to next week, and change to q 28 days cycle to build in an off week to allow for counts to recover     CA 125: 10.6    07/02/2018:   C4 weekly taxol 80 mg/m2 D1,8,15, Carboplatin AUC 6 D1 q 28 days. EXAMS ON EVEN CYCLES.  Review her genetics testing results      CA 125 7.2    07/30/2018:  C5 weekly taxol 80 mg/m2 D1,8,15, Carboplatin AUC 6 D1 q 28 days. Neulasta Day 16      EXAMS ON EVEN CYCLES.      CA 125: 11.4    08/27/2018:   C6 weekly taxol 80 mg/m2 D1,8,15, Carboplatin AUC 6 D1 q 28 days. Neulasta Day 16. Order Ct CAP. RTC 10/11 (3 weeks)     EXAMS ON EVEN CYCLES. Discussed option  of maintenance parp I- pt not decided (doesn't want to feel tired anymore)     CA 125:  8.7       05/28/2018 Tumor Markers    Tumor markers tested:  CA 125.  Results:   25.     06/25/2018 Tumor Markers    Tumor markers tested:  CA 125.  Results:   10.6.     07/02/2018 Tumor Markers    Tumor markers tested:  CA 125.  Results:   7.2.     07/07/2018 Hospital Admission    Admit date: 7/31 - 07/13/18  Admission diagnosis: Partial SBO  Additional comments: On 7/31 she experienced abdominal pain at home that was sharp on her LLQ and had significant diarrhea and one episode of emesis. She came to be evaluated at the [facility] clinic. She has a history of prior small bowel obstruction. She was give IL NS, 2mg  morphine IV, and 4mg  Zofran IV in clinic and admitted for further observation. The abdominal XR showed dilated bowel loops, concerning for obstruction. CT abdomen/pelvis showed dilation of the small bowel with transition point seen in the anterior mid abdomen near the midline.     07/07/2018 Imaging    Imaging Completed:  X-ray of  abdomen  Result: Nonspecific bowel gas pattern. The presence of multiple air-fluid levels in small bowel raises question of early or partial small bowel obstruction.     07/07/2018 Imaging    CTAP:Small bowel obstruction with transition point in the anterior mid abdomen near the midline. Small amount of free fluid in the abdomen, which is nonspecific. 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  Peritoneum: Small amount of free fluid in the abdomen. A para-aortic density with central low attenuation measures 2.5 x 2 cm and is significantly decreased in size from prior.       07/30/2018 Tumor Markers    Tumor markers tested:  CA 125.  Results:   11.4.     08/27/2018 Tumor Markers    Tumor markers tested:  CA 125.  Results:   8.7.     09/20/2018 Procedure    Port removed: While the skin surface appeared inflamed, there was no evidence of infection in the port reservoir.       09/22/2018 Imaging    CT Chest-No evidence of  thoracic metastatic disease.  Suspected small seroma in the base of the right neck.  Necrotic metastatic lesion could also have this appearance but considered less likely.    [name]- Interval decrease in size of a left periaortic necrotic lymph node. No evidence of new metastatic disease.     10/15/2018 Imaging    Imaging Completed:  PET scan of  whole body  Result: Resolution of the previous hypermetabolic para-aortic lymph nodes and hepatic dome lesions.  Hypermetabolic focus within the left thyroid lobe.  Consider further evaluation with ultrasound, if clinically indicated.       10/19/2018 Imaging    Imaging Completed:  Ultrasound of  neck  Result: 1.2 cm irregular hypoechoic nodule in the left thyroid lobe is suspicious. Recommend FNA for further evaluation.  1.0 cm nodule right thyroid lobe containing macrocalcification is indeterminate. Recommend follow-up thyroid ultrasound in one year to evaluate for stability.     10/21/2018 Tumor Markers    Tumor markers tested:  CA 125.  Results:   4.8.     11/12/2018 Tumor Markers    Tumor markers tested:  CA  125  Results:   5.5     11/12/2018 - 12/03/2018 Systemic Therapy (Oral and IV)    11/12/2018: C1 rubraca 600 mg po BID. RTC 2 weeks for labs (CBC, CMP, 4 weeks for PCE #2). Pt plans to go to [place] and may leave before PCE and re-est care at [facility] (Dr. Gerilyn Pilgrim). Referral to ENT placed (pt cancelled appt for 11/22) and FNA was prev ordered for thyroid nodule- but has not been done.        03/02/2019 Imaging    CT Scan from [facility]    No evidence for recurrent or new neoplasm. Increased colonic stool could indicate constipation. Postoperative changes     06/13/2019 Tumor Markers    Tumor markers tested: CA 125 Results:   5.0.     09/15/2019 Tumor Markers    CA 125-6.1     05/31/2020 Tumor Markers    Ca 125-5.9     08/16/2020 Tumor Markers    CA 125-6.2     02/06/2021 Tumor Markers    CA 125-4.6     08/25/2022 Imaging    CT a/p: 2.6 cm lesion along the dome of the  right hepatic lobe may represent a peritoneal lesion along the undersurface of the diaphragm rather than a true hepatic parenchymal lesion. 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     CT chest: No evidence of intrathoracic metastatic disease.      08/29/2022 Imaging    PET: 1.  Two FDG avid foci at the superior surface of right hepatic lobe, favored to be peritoneal nodules along the undersurface of right hemidiaphragm, concerning for metastases.      09/15/2022 Procedure    09/15/2022:   scheduled for Power Port Insertion with Dr. Andrey Campanile      09/16/2022 -  Systemic Therapy (Oral and IV)    09/16/2022:   C1 carboplatin AUC4, gem 1000 mg/m2 (D1/8), Bev D1 q 21 days  Treatment Summary   Treatment goal Disease control   Plan Name OP Gyn CARBOplatin AUC 4 / Gemcitabine / Bevacizumab   Status Active   Start Date 09/16/2022   End Date 03/12/2023 (Planned)   Provider Mikle Bosworth, MD   Chemotherapy bevacizumab (AVASTIN) 850 mg in sodium chloride 0.9 % 100 mL chemo infusion, 15 mg/kg = 850 mg, Intravenous, Once, 5 of 8 cycles  Administration: 850 mg (09/16/2022), 850 mg (10/16/2022), 850 mg (11/06/2022), 850 mg (11/27/2022), 850 mg (01/01/2023)  gemcitabine (GEMZAR) 1,600 mg in sodium chloride 0.9 % 100 mL chemo infusion, 1,000 mg/m2 = 1,600 mg, Intravenous, Once, 5 of 8 cycles  Administration: 1,600 mg (09/16/2022), 1,600 mg (09/23/2022), 1,600 mg (10/16/2022), 1,600 mg (10/23/2022), 1,600 mg (11/06/2022), 1,600 mg (11/13/2022), 1,600 mg (11/27/2022), 1,600 mg (12/04/2022), 1,600 mg (01/01/2023), 1,600 mg (01/08/2023)            09/16/2022 Tumor Markers    Tumor markers tested:  CA 125  Results:   7.5     10/07/2022 Tumor Markers    CA125: 7.9     10/16/2022 Tumor Markers    CA 125: 7.9     11/06/2022 Tumor Markers    CA 125: 11.1     11/17/2022 Imaging    Imaging Completed:  CT Scan of  chest, abdomen, and pelvis  AP: Interval  decrease in size of the peritoneal nodule in the dome of the right hepatic lobe. No evidence of new metastatic  disease.   CHEST: No evidence of intrathoracic metastatic disease.      11/27/2022 Tumor Markers    CA 125: 11.4       Past Medical History  She  has a past medical history of Bowel obstruction (CMS-HCC) (2018), Ovarian cancer (CMS-HCC) (2014), and Thyroid disease.  Past Medical History:   Diagnosis Date    Bowel obstruction (CMS-HCC) 2018    Ovarian cancer (CMS-HCC) 2014    Thyroid disease      Past Surgical History  Past Surgical History:   Procedure Laterality Date    ABDOMINOPLASTY  1999    BLEPHAROPLASTY  2000    DILATION AND CURETTAGE OF UTERUS  [place] VASCULAR ACCESS N/A 09/15/2022    Procedure: INSERTION POWER PORT A CATH;  Surgeon: Cyndi Bender, MD;  Location: [facility] OR;  Service: General;  Laterality: N/A;  Left Subclavian    KNEE ARTHROPLASTY Right     ROTATOR CUFF REPAIR      TRANSUMBILICAL AUGMENTATION MAMMAPLASTY      VAGINAL HYSTERECTOMY  1979     Family History  History reviewed. No pertinent family history.    Family cancer history for ovarian, uterine and colon cancer is negative other than above.     Social History  Social History     Socioeconomic History    Marital status: Married     Spouse name: None    Number of children: None    Years of education: None    Highest education level: None   Occupational History    None   Tobacco Use    Smoking status: Some Days     Packs/day: 0     Types: Cigarettes    Smokeless tobacco: Never    Tobacco comments:     Smokes 1 pack every three days    Vaping Use    Vaping Use: Never used   Substance and Sexual Activity    Alcohol use: Yes     Alcohol/week: 14.0 standard drinks of alcohol     Types: 14 Shots of liquor per week    Drug use: No    Sexual activity: Not Currently   Other Topics Concern    Caffeine Use Yes    Occupational Exposure No    Exercise Yes    Seat Belt Yes   Social  History Narrative    Mammogram-6 years ago and it was normal. Pt stated she isn't getting them anymore.      Colonoscopy-10 years ago        Pt reports she can lie flat in bed without SOB.    Pt states she can walk a flight of stairs and city block without chest pain and SOB>       Social Determinants of Health     Intimate Partner Violence: Not At Risk (11/21/2022)    Humiliation, Afraid, Rape, and Kick questionnaire     Fear of Current or Ex-Partner: No     Emotionally Abused: No     Physically Abused: No     Sexually Abused: No   Housing Stability: Low Risk  (01/15/2023)    Yearly Questionnaire     Do you need any assistance with obtaining housing, meals, medication, transportation or medical equipment?: No     Assistance needed for:: Not on file     Past OB/GYN History  G3P2  She reports  menarche at age 48 and menopause at age 63-surgical.  She denies a history of STIs.  She denies a history of abnormal cervical cytology and reports her last cytologic examination she is unsure of. She has taken HRT.     Health maintenance:  Mammogram: Date 6 years ago Results normal  Colonoscopy: Date 10 years ago Results normal    Allergies  She is allergic to monosodium glutamate, nsaids (non-steroidal anti-inflammatory drug), sulfamethoxazole-trimethoprim, celecoxib, and metoclopramide.    BMI  Body mass index is 21.8 kg/m.    Disease Status: No evidence of disease/remission (08/21/2022 12:27 PM)              Histories  She has a past medical history of Bowel obstruction (CMS-HCC) (2018), Ovarian cancer (CMS-HCC) (2014), and Thyroid disease.    She has a past surgical history that includes Dilation and curettage of uterus (1979); Vaginal hysterectomy (1979); Elbow surgery (1990); Eye surgery (1994); Abdominoplasty (1999); Blepharoplasty (2000); Transumbilical augmentation mammaplasty; Rotator cuff repair; Knee Arthroplasty (Right); and insertion catheter vascular access (N/A, 09/15/2022).    Her family history is not on  file.    She reports that she has been smoking cigarettes. She has never used smokeless tobacco. She reports current alcohol use of about 14.0 standard drinks of alcohol per week. She reports that she does not use drugs.    Allergies  Monosodium glutamate, Nsaids (non-steroidal anti-inflammatory drug), Sulfamethoxazole-trimethoprim, Celecoxib, and Metoclopramide    Medications  Outpatient Encounter Medications as of 01/15/2023   Medication Sig Dispense Refill    apixaban (ELIQUIS) 5 mg Tab Take 1 tablet (5 mg total) by mouth 2 times a day. 180 tablet 1    ascorbic acid, vitamin C, (VITAMIN C) 1000 MG tablet Take 1 tablet (1,000 mg total) by mouth daily.      cetirizine 10 mg Cap Take 10 mg by mouth daily.      cholecalciferol, vitamin D3, (VITAMIN D3) 1000 units tablet Take 1 tablet (1,000 Units total) by mouth daily. 30 tablet 0    dexAMETHasone (DECADRON) 4 MG tablet Take 8 mg by mouth daily with food on day 2, 3, and 4 of chemotherapy cycle 18 tablet 4    GLUTATHIONE-L ORAL Take 1 teaspoonful by mouth daily.      IODINE ORAL Take 50 mg/day by mouth.      Lactobac no.41/Bifidobact no.7 (PROBIOTIC-10 ORAL) Take 1 capsule by mouth daily. [employer] brand      levothyroxine (SYNTHROID) 75 MCG tablet Take 1 tablet (75 mcg total) by mouth every morning before breakfast.      lisinopriL (PRINIVIL) 10 MG tablet Take 2 tablets (20 mg total) by mouth daily. 60 tablet 3    omeprazole (PRILOSEC) 20 MG capsule Take 1 capsule (20 mg total) by mouth in the morning and at bedtime.      sodium chloride (OCEAN) 0.65 % nasal spray Use 1 spray into each nostril if needed. 44 mL 0    UNABLE TO FIND Take 30 Sullivan by mouth daily. Multimineral      UNABLE TO FIND Take 20 Sullivan by mouth daily. Med Name: Kelsey Sullivan      UNABLE TO FIND Take 2 capsules by mouth daily. Med Name: Body Bio      vit b complex w-b 12 tablet Take 1 tablet by mouth daily.      ZINC CITRATE ORAL Take 1 tablet by mouth daily.       No facility-administered  encounter medications on  file as of 01/15/2023.      Review of Systems  see HPI    Vitals  Blood pressure 140/75, pulse 86, temperature 97.9 F (36.6 C), temperature source Oral, resp. rate 15, height 5' 4 (1.626 m), weight 127 lb (57.6 kg), SpO2 98%.    Physical Exam   Vitals reviewed.  Constitutional: She is oriented to person, place, and time. She appears well-developed.   HENT:   Head: Atraumatic.   Nose: Nose normal.   Neck: No thyromegaly present.   Cardiovascular: Normal rate.   Pulmonary/Chest: Effort normal.   Abdominal: Normal appearance. She exhibits no distension.   Genitourinary:    Genitourinary Comments: def     Musculoskeletal:         General: Normal range of motion.      Cervical back: Neck supple.   Neurological: She is alert and oriented to person, place, and time.   Skin: Skin is warm and dry.   Psychiatric: Her behavior is normal.        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     Posterior cul de sac biopsy-metastatic adenocarcinoma.     Omentectomy- metastatic adenocarcinoma     LND-negative     Residual right IP ligament, Anterior cul de sac biopsy, right pelvic side will biopsy, left pelvic side wall, right gutter and left  gutter-->benign    04/06/2017:   CT [name]- mild small bowel obstruction with transition point in the left pelvis adjacent to the anterior abdominal wall. Low density in the common femoral veins bilaterally thought likely to represent flow artificat, may consider follow up with lomer extremity Doppler to ensure there is no underlying deep venous thrombosis.    01/29/2018:   CTAP:  Interval development of necrotic retroperitoneal lymphadenopathy with largest node measuring 4.9  4.2 x 4.8 cm. Origin is indeterminate.  Options would include CT directed biopsy as well as PET/CT.    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  No other focal areas of FD avid malignancy are found. Specifically, no lymph node activity is found outside of the retroperitoneum and there  is no evidence for FDG avid pulmonary, skeletal or hepatic parenchymal metastasis.    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 No retroperitoneal, intra-abdominal, pelvic or inguinal adenopathy, no evidence for peritoneal surface neoplastic involvement. 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 She completed 6 cycles of chemotherapy with Carboplatin and Paclitaxel-weekly (C6D1 was 08/27/18). CA 125 was 8.7 at C6.     She underwent CT CAP on 09/22/2018 revealed ned in chest and interval decrease in size of a left periaortic necrotic lymph node from 1.8 to 1.6 cm. 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     PET on 10/15/18 shows reassuring intraabdominal findings with resolution of the previous hypermetabolic para-aortic lymph nodes and hepatic dome lesions, but did note uptake within the thyroid. She underwent thyroid US on 10/20/18 with a 1.2 cm irregular hypoechoic nodule in the left thyroid lobe is suspicious. Recommend FNA for further evaluation.     She initiated maintenance rubraca 11/2018 but ultimately elected to stop in 02/2019 due to side effects.   Patient had reestarted drug at 500 mg po BID once she got down to [place]-. Was in [place] from 12/2018-05/2019. Reports her oncologist down in [place] had changed doses of Rubraca a few times, to 300mg  po BID but decided to stop drug all together back in 02/2019 2/2 to decreased QOL. She does not wish to restart drug at this time. Last CT scan 03/02/2019 in [place], no evidence of recurrent or new neoplasm.     She was seen for surveillance 08/21/2022 with an increase in her CA 125 to 8.5 - CT CAP and PET were checked and consistent with platinum sensitive recurrence #2.     At this time, she has decided to proceed with chemotherapy.    She underwent repeat CTs following C3 on 11/17/22 which showed partial  response with interval decrease in size of the peritoneal nodule in the dome of the right hepatic lobe NED in the chest.     She is seen today for early PCE Prior to C6 of Carboplatin/Gemcitabine/bevacizumab. Neulasta added to C2D- planned next week 2/15      She is tolerating chemotherapy appropriately well, and has experienced expected but manageable side effects. I have reviewed her labs and she is cleared chemotherapy. There is no evidence of progressive disease or excessive toxicity today, and we will continue treatment without dose modification. We will see her back for her next cycle of treatment, and she knows to call if questions or problems arise.       01/15/2023:   Early PCE- RTC 1 week  C6, plan CTs and FU CB to reviw on 3/1, plan C1 maintenance bev (if CTs CR/PR)- and then she plans to move back to [place] to get her bev at home. No NP visit needed next week    01/22/2023:   Plan C6 carboplatin AUC4, gem 1000 mg/m2 (D1/8), Bev D1 q 21 days. 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 Neulasta D9 (started C2), LUE/neck DVTs 10/2022- on eliquis. CTs after C6, plan bev maintenance. plan CTs and FU CB to reviw on 3/1, plan C1 maintenance bev (if CTs CR/PR)- and then she plans to move back to [place] to get her bev at home  CA 125    . [facility]; Dr. Johny Drilling Fax [phone]) [place], [place]; Dr. Christella Hartigan Fax [phone])     __ CA 125 stable and normal   __ repeat CT scans after C6  __ENT referral placed for nose bleeds (likely bev related)  __Neulasta added to D9 C2  __Lisinopril added for HTN 10/07/2022  __new LUE/neck DVT 12/2021- on eliquis- see below    VTE:  -Patient was admitted from 11/12-11/13/23 to the hospital for extensive LUE/shoulder/neck DVT and was started on a heparin gtt and discharged home on eliquis. Per vascular surgery, there were no acute interventions needed besides anticoagulation. She was on eliquis 10 mg BID for 7 days (11/13-11/19) followed by eliquis 5mg  BID for 6 months.  Per surgical oncology, she only needs repeat imaging if the swelling gets worse while she is on [facility] or if the port becomes non-functional. Clot burden on exam is improved  - had two weeks of multiple nose bleeds on the eliquis but stopped taking the herbs that might have been interacting with her eliquis and that stopped her nose bleeds   __She is going to continue on her bev  __Referred to ENT now, no current nose bleed    Patient was admitted 11/21/22-11/24/22 for management of anemia and leukocytosis. She required plt transfusion during admission. Patient was discharged with instructions to restart eliquis 2.5mg  BID (prophylactic dosing) until repeat CBC with Plt > 50, at which time she can resume therapeutic dosing of eliquis 5mg  BID     Seen 12/18/22, compliant with eliquis 2.5mg  BID. CBC today Plt > 50, will resume therapeutic dosing of eliquis 5mg  BID   __ patient informed and eliquis 5mg  BID refilled 12/18/22    Thyroid Nodule:   Incidental finding on PET scan of hypermetabolic focus within left thyroid nodule. Ultrasound performed on 10/20/18 with 1.2 cm irregular hypoechoic nodule, appearing suspicious. Recommend FNA for further evaluation, which was ordered. Pt has not followed up to date.  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 Referral to ENT made, but patient did not see them. She followed up with her PCP and was started on Synthroid. Last TSH 5.91. Repeat TSH/T4/T3 labs wnl 11/2020     Genetics:  previously discussed her negative genetic testing results.    Pain    Pain Score: Zero (01/15/2023 12:58 PM)    She denies presence of pain.  Pain will be reassessed at next visit.             The HPI, ROS, physical exam, test results, and assessment & plan were reviewed and copied forward (with edits) from a note written by Dr. [name] on 12/18/22. I have reviewed and updated the history, physical exam, data, assessment, and plan of the note so that it reflects my evaluation and  management of the  patient.    Georgeanna Lea, MD PGY-2  OB-GYN Resident      I saw and personally examined the patient today with my resident. I discussed the findings and therapeutic plan with the patient. I repeated, reviewed and agree with the history of present illness, past medical histories, family history, social history, medication list, and allergies as listed. The review of systems is as noted above. My physical exam confirms the findings listed above. Review of labs, pathology reports, radiograph reports, and medical records confirm the findings noted above. I agree with the assessment and plan as noted above. I have edited the note where appropriate.     I have personally performed a face to face diagnostic evaluation on this patient, seen initially by the resident. I have personally developed the care plan as outlined in the Assessment and Plan.     This note was completely edited, written and reviewed by me and consists of information cut and pasted from the my most recent visit, my smart phrases and other Epic tools. I have personally reviewed all aspects of this note to at least include reviewing this patient's chart and problem list, updating the history, physical exam, lab and procedure results, and assessment and plan as detailed above and below. As such this visit note reflects my current evaluation and management for this patient.    This note was copied forward from the note written by me on 12/18/22. I have reviewed and updated the history, physical exam, data, assessment and plan of the note so that it reflects the evaluation and management of the patient on 01/15/23.        Complexity: HIGH     I spent a total of 40 minutes was spent prior to, during and after the patient encounter in counseling and/or coordination of care, documentation and counseling with patient and/or family.      Topics discussed include ovarian  Cancer prognosis and treatment options and management of cheom    01/15/2023:    Early PCE- RTC 1 week C6, plan CTs and FU CB to reviw on 3/1, plan C1 maintenance bev (if CTs CR/PR)- and then she plans to move back to [place] to get her bev at home. No NP visit needed next week    01/22/2023:   Plan C6 carboplatin AUC4, gem 1000 mg/m2 (D1/8), Bev D1 q 21 days. 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 Neulasta D9 (started C2), LUE/neck DVTs 10/2022- on eliquis. CTs after C6, plan bev maintenance. plan CTs and FU CB to reviw on 3/1, plan C1 maintenance bev (if CTs CR/PR)- and then she plans to move back to [place] to get her bev at home  CA 125.      Maggie Font, MD, FACOG  Associate Professor, Obstetrics and Gynecology  Division of Gynecologic Oncology    Medical Decision Making:  The following items were considered in medical decision making:  Review / order clinical lab tests  Review / order radiology tests  Review / order other diagnostic tests/interventions

## 2023-01-16 NOTE — Telephone Encounter (Signed)
Patient called in wanting a message sent to the nurses.  She said that her records have not been sent to her doctor in [place] so she can get an appointment scheduled.  She would like a call back when they are sent.

## 2023-01-16 NOTE — Telephone Encounter (Signed)
Fax received from [facility], records sent. Also forwarded record request to medical records department.

## 2023-01-16 NOTE — Telephone Encounter (Signed)
Returning call to patient, requested call back.     Future Appointments   Date Time Provider [place]   01/19/2023 10:30 AM INTEGRATIVE MOVEMENT THERAPY, [place] [facility] MOVE GNI [facility]   01/22/2023 10:30 AM PORT CHAIR 2 BC3 [facility] PORT 3 BC [facility]   01/22/2023 11:00 AM CHAIR 06 INF 3 BC [facility] INF3 [facility]   01/26/2023 10:30 AM INTEGRATIVE MOVEMENT THERAPY, [place] [facility] MOVE GNI U[facility]NI   02/02/2023  9:00 AM TDC CT ROOM 1 TDC [facility]   02/02/2023 10:30 AM INTEGRATIVE MOVEMENT THERAPY, [place] [facility] MOVE GNI U[facility]NI   02/06/2023  9:00 AM Port Chair 1 Bc3 [facility] PORT 3 [facility]   02/06/2023  9:30 AM Desmond Lope, MD [facility]   02/09/2023 10:30 AM INTEGRATIVE MOVEMENT THERAPY, [place] [facility] MOVE GNI U[facility]NI   02/16/2023 10:30 AM INTEGRATIVE MOVEMENT THERAPY, [place] [facility] MOVE GNI U[facility]NI   02/23/2023 10:30 AM INTEGRATIVE MOVEMENT THERAPY, [place] Y[place] [facility] MOVE GNI U[facility]NI   03/02/2023 10:30 AM INTEGRATIVE MOVEMENT THERAPY, [place] Y[place] [facility] MOVE GNI UCGNI   03/09/2023 10:30 AM INTEGRATIVE MOVEMENT THERAPY, [place] Y[place] [facility] MOVE GNI UCGNI   03/16/2023 10:30 AM INTEGRATIVE MOVEMENT THERAPY, [place] Y[place]GA [facility] MOVE GNI U[facility]NI   03/23/2023 10:30 AM INTEGRATIVE MOVEMENT THERAPY, [place] Y[place] MOVE GNI UCGNI

## 2023-01-19 ENCOUNTER — Ambulatory Visit: Payer: Medicare (Managed Care)

## 2023-01-22 ENCOUNTER — Other Ambulatory Visit: Admit: 2023-01-22 | Payer: Medicare (Managed Care)

## 2023-01-22 ENCOUNTER — Ambulatory Visit: Admit: 2023-01-22 | Payer: Medicare (Managed Care)

## 2023-01-22 ENCOUNTER — Ambulatory Visit: Payer: Medicare (Managed Care)

## 2023-01-22 ENCOUNTER — Institutional Professional Consult (permissible substitution): Admit: 2023-01-22 | Payer: Medicare (Managed Care)

## 2023-01-22 DIAGNOSIS — C569 Malignant neoplasm of unspecified ovary: Secondary | ICD-10-CM

## 2023-01-22 LAB — DIFFERENTIAL
Basophils Absolute: 20 /uL (ref 0–200)
Basophils Relative: 0.3 % (ref 0.0–1.0)
Eosinophils Absolute: 20 /uL (ref 15–500)
Eosinophils Relative: 0.3 % (ref 0.0–8.0)
Lymphocytes Absolute: 1265 /uL (ref 850–3900)
Lymphocytes Relative: 18.6 % (ref 15.0–45.0)
Monocytes Absolute: 619 /uL (ref 200–950)
Monocytes Relative: 9.1 % (ref 0.0–12.0)
Neutrophils Absolute: 4876 /uL (ref 1500–7800)
Neutrophils Relative: 71.7 % (ref 40.0–80.0)
nRBC: 0 /100 WBC (ref 0–0)

## 2023-01-22 LAB — COMPREHENSIVE METABOLIC PANEL
ALT: 22 U/L (ref 7–52)
AST: 22 U/L (ref 13–39)
Albumin: 4.5 g/dL (ref 3.5–5.7)
Alkaline Phosphatase: 99 U/L (ref 36–125)
Anion Gap: 8 mmol/L (ref 3–16)
BUN: 10 mg/dL (ref 7–25)
CO2: 28 mmol/L (ref 21–33)
Calcium: 9.4 mg/dL (ref 8.6–10.3)
Chloride: 99 mmol/L (ref 98–110)
Creatinine: 0.69 mg/dL (ref 0.60–1.30)
EGFR: 87
Glucose: 144 mg/dL — ABNORMAL HIGH (ref 70–100)
Osmolality, Calculated: 282 mOsm/kg (ref 278–305)
Potassium: 4.5 mmol/L (ref 3.5–5.3)
Sodium: 135 mmol/L (ref 133–146)
Total Bilirubin: 0.3 mg/dL (ref 0.0–1.5)
Total Protein: 6.6 g/dL (ref 6.4–8.9)

## 2023-01-22 LAB — CBC
Hematocrit: 28.5 % — ABNORMAL LOW (ref 35.0–45.0)
Hemoglobin: 9.5 g/dL — ABNORMAL LOW (ref 11.7–15.5)
MCH: 33.3 pg — ABNORMAL HIGH (ref 27.0–33.0)
MCHC: 33.5 g/dL (ref 32.0–36.0)
MCV: 99.5 fL (ref 80.0–100.0)
MPV: 9.2 fL (ref 7.5–11.5)
Platelets: 76 10*3/uL — ABNORMAL LOW (ref 140–400)
RBC: 2.86 10*6/uL — ABNORMAL LOW (ref 3.80–5.10)
RDW: 17 % — ABNORMAL HIGH (ref 11.0–15.0)
WBC: 6.8 10*3/uL (ref 3.8–10.8)

## 2023-01-22 LAB — URINALYSIS W/RFL TO MICROSCOPIC
Bilirubin, UA: NEGATIVE
Blood, UA: NEGATIVE
Glucose, UA: NEGATIVE mg/dL
Ketones, UA: NEGATIVE mg/dL
Nitrite, UA: NEGATIVE
Protein, UA: NEGATIVE mg/dL
RBC, UA: 1 /HPF (ref 0–3)
Specific Gravity, UA: 1.012 (ref 1.005–1.035)
Squam Epithel, UA: 2 /HPF (ref 0–5)
Trans Epithel, UA: <1 /HPF (ref 0–5)
Urobilinogen, UA: <2 mg/dL (ref 0.2–1.9)
WBC, UA: 3 /HPF (ref 0–5)
pH, UA: 7 (ref 5.0–8.0)

## 2023-01-22 LAB — CA 125: CA 125: 8 U/mL (ref 5.5–35.0)

## 2023-01-22 LAB — MAGNESIUM: Magnesium: 1.8 mg/dL (ref 1.5–2.5)

## 2023-01-22 MED ORDER — sodium chloride 0.9% flush 10 mL
Freq: Every day | INTRAMUSCULAR | Status: AC | PRN
Start: 2023-01-22 — End: 2023-01-23
  Administered 2023-01-22: 16:00:00 10 mL via INTRAVENOUS

## 2023-01-22 NOTE — Nursing Note (Signed)
Anterior chest port-a-cath located on left side. Sterile prep of port-a-cath site done. Accessed PAC site with 20 gauge huber needle 3/4 inch. Primed with 0.9% Sodium Chloride using 10 ml syringe. Blood return was noted. Drew blood and wasted. Drew blood for labs ordered. Tegaderm dressing applied.  Flushed with 10 ml of Sodium Chloride using 10 ml syringe. Notified infusion nurse that she is ready for the appointment.

## 2023-01-22 NOTE — Nursing Note (Signed)
Kelsey Sullivan arrived for cycle 6 day 1 of Avastin, Gemzar, and Carboplatin chemotherapy.   Fall assessment completed patient is low risk, no intervention needed, oriented to room and call light given.   Patient arrived with port accessed from port room, site within normal limits, dressing dry and intact, flushes easy with positive blood return.     Platelets are below treatment parameters at 76, patient reports having a nose bleed on Saturday/Sunday that was mostly intermittent clots.   Per B. Hesse CNP treatment to be held today, return in 1 week for labs and treatment.   Patient made aware and agreeable. Thrombocytopenia educationed added to AVS and reviewed with patient.     AVS given and next infusion appointment reviewed with patient.   Port flushed with 0.9% saline 10 mls and Heparin 49mls: 100 units per ml, Huber needle removed.

## 2023-01-22 NOTE — Progress Notes (Signed)
Spoke to patient and clarified upcoming appointments. No further questions at this time.     Future Appointments   Date Time Provider [place]   01/26/2023 10:30 AM INTEGRATIVE MOVEMENT THERAPY, [place] [facility] MOVE GNI U[facility]NI   01/29/2023  8:00 AM Port Chair 1 Bc3 [facility] PORT 3 [facility]   01/29/2023  8:30 AM CHAIR 08 INF 3 BC [facility] INF3 [facility]   01/30/2023  9:20 AM TDC CT ROOM 1 TDC CT [facility]   02/02/2023 10:30 AM INTEGRATIVE MOVEMENT THERAPY, [place] Y[place] [facility] MOVE GNI UCGNI   02/05/2023  8:30 AM PORT CHAIR 3 BC3 [facility] PORT 3 BC [facility]   02/05/2023  9:00 AM CHAIR 12 INF 3 BC [facility] INF3 [facility]   02/09/2023 10:30 AM INTEGRATIVE MOVEMENT THERAPY, [place] [facility] MOVE GNI U[facility]NI   02/12/2023  8:00 AM PORT CHAIR 2 BC3 [facility] PORT 3 BC [facility]   02/12/2023  8:30 AM CHAIR 05 INF 3 BC [facility] INF3 [facility]   02/16/2023 10:30 AM INTEGRATIVE MOVEMENT THERAPY, [place] [facility] MOVE GNI UCGNI   02/19/2023  8:15 AM Port Chair 1 Bc3 [facility] PORT 3 [facility]   02/19/2023  8:45 AM Desmond Lope, MD [facility]   02/23/2023 10:30 AM INTEGRATIVE MOVEMENT THERAPY, [place] [facility] MOVE GNI UCGNI   03/02/2023 10:30 AM INTEGRATIVE MOVEMENT THERAPY, [place] Y[place] [facility] MOVE GNI UCGNI   03/09/2023 10:30 AM INTEGRATIVE MOVEMENT THERAPY, [place] Y[place] [facility] MOVE GNI UCGNI   03/16/2023 10:30 AM INTEGRATIVE MOVEMENT THERAPY, [place] Y[place] [facility] MOVE GNI UCGNI   03/23/2023 10:30 AM INTEGRATIVE MOVEMENT THERAPY, [place] MOVE GNI UCGNI

## 2023-01-26 ENCOUNTER — Ambulatory Visit: Payer: Medicare (Managed Care)

## 2023-01-29 ENCOUNTER — Other Ambulatory Visit: Admit: 2023-01-29 | Payer: Medicare (Managed Care)

## 2023-01-29 ENCOUNTER — Institutional Professional Consult (permissible substitution): Admit: 2023-01-29 | Payer: Medicare (Managed Care)

## 2023-01-29 ENCOUNTER — Ambulatory Visit: Admit: 2023-01-29 | Payer: Medicare (Managed Care)

## 2023-01-29 DIAGNOSIS — C569 Malignant neoplasm of unspecified ovary: Secondary | ICD-10-CM

## 2023-01-29 LAB — CBC
Hematocrit: 29.8 % — ABNORMAL LOW (ref 35.0–45.0)
Hemoglobin: 10.3 g/dL — ABNORMAL LOW (ref 11.7–15.5)
MCH: 34.5 pg — ABNORMAL HIGH (ref 27.0–33.0)
MCHC: 34.6 g/dL (ref 32.0–36.0)
MCV: 99.6 fL (ref 80.0–100.0)
MPV: 7.6 fL (ref 7.5–11.5)
Platelets: 163 10E3/uL (ref 140–400)
RBC: 2.99 10E6/uL — ABNORMAL LOW (ref 3.80–5.10)
RDW: 18.1 % — ABNORMAL HIGH (ref 11.0–15.0)
WBC: 5.5 10E3/uL (ref 3.8–10.8)

## 2023-01-29 LAB — COMPREHENSIVE METABOLIC PANEL
ALT: 15 U/L (ref 7–52)
AST: 23 U/L (ref 13–39)
Albumin: 4.4 g/dL (ref 3.5–5.7)
Alkaline Phosphatase: 81 U/L (ref 36–125)
Anion Gap: 9 mmol/L (ref 3–16)
BUN: 11 mg/dL (ref 7–25)
CO2: 26 mmol/L (ref 21–33)
Calcium: 9.4 mg/dL (ref 8.6–10.3)
Chloride: 98 mmol/L (ref 98–110)
Creatinine: 0.72 mg/dL (ref 0.60–1.30)
EGFR: 84
Glucose: 184 mg/dL — ABNORMAL HIGH (ref 70–100)
Osmolality, Calculated: 280 mosm/kg (ref 278–305)
Potassium: 4.4 mmol/L (ref 3.5–5.3)
Sodium: 133 mmol/L (ref 133–146)
Total Bilirubin: 0.3 mg/dL (ref 0.0–1.5)
Total Protein: 6.3 g/dL — ABNORMAL LOW (ref 6.4–8.9)

## 2023-01-29 LAB — URINALYSIS W/RFL TO MICROSCOPIC
Bilirubin, UA: NEGATIVE
Blood, UA: NEGATIVE
Glucose, UA: NEGATIVE mg/dL
Ketones, UA: NEGATIVE mg/dL
Nitrite, UA: NEGATIVE
Protein, UA: NEGATIVE mg/dL
RBC, UA: 4 /HPF — ABNORMAL HIGH (ref 0–3)
Specific Gravity, UA: 1.018 (ref 1.005–1.035)
Squam Epithel, UA: 3 /HPF (ref 0–5)
Trans Epithel, UA: <1 /HPF (ref 0–5)
Urobilinogen, UA: <2 mg/dL (ref 0.2–1.9)
WBC, UA: 9 /HPF — ABNORMAL HIGH (ref 0–5)
pH, UA: 6.5 (ref 5.0–8.0)

## 2023-01-29 LAB — DIFFERENTIAL
Basophils Absolute: 33 /uL (ref 0–200)
Basophils Relative: 0.6 % (ref 0.0–1.0)
Eosinophils Absolute: 44 /uL (ref 15–500)
Eosinophils Relative: 0.8 % (ref 0.0–8.0)
Lymphocytes Absolute: 1375 /uL (ref 850–3900)
Lymphocytes Relative: 25 % (ref 15.0–45.0)
Monocytes Absolute: 726 /uL (ref 200–950)
Monocytes Relative: 13.2 % — ABNORMAL HIGH (ref 0.0–12.0)
Neutrophils Absolute: 3322 /uL (ref 1500–7800)
Neutrophils Relative: 60.4 % (ref 40.0–80.0)
nRBC: 0 /100 WBC (ref 0–0)

## 2023-01-29 LAB — CA 125: CA 125: 7.6 U/mL (ref 5.5–35.0)

## 2023-01-29 LAB — MAGNESIUM: Magnesium: 1.5 mg/dL (ref 1.5–2.5)

## 2023-01-29 MED ORDER — CARBOplatin (PARAPLATIN) 300 mg in dextrose 5% in water (D5W) 250 mL chemo infusion
Freq: Once | INTRAVENOUS | Status: AC
Start: 2023-01-29 — End: 2023-01-29
  Administered 2023-01-29: 16:00:00 300 mg via INTRAVENOUS

## 2023-01-29 MED ORDER — proCHLORPERazine (COMPAZINE) tablet 10 mg
10 | Freq: Four times a day (QID) | ORAL | PRN
Start: 2023-01-29 — End: 2023-01-29

## 2023-01-29 MED ORDER — albuterol (PROVENTIL) 90 mcg/actuation inhaler 1-2 puff
90 | RESPIRATORY_TRACT | PRN
Start: 2023-01-29 — End: 2023-01-29

## 2023-01-29 MED ORDER — bevacizumab (AVASTIN) 850 mg in sodium chloride 0.9 % 100 mL chemo infusion
Freq: Once | INTRAVENOUS | Status: AC
Start: 2023-01-29 — End: 2023-01-29
  Administered 2023-01-29: 17:00:00 850 mg/kg via INTRAVENOUS

## 2023-01-29 MED ORDER — diphenhydrAMINE (BENADRYL) injection 25 mg
50 | Freq: Every day | INTRAMUSCULAR | PRN
Start: 2023-01-29 — End: 2023-01-29

## 2023-01-29 MED ORDER — sodium chloride 0.9% flush 10 mL
Freq: Every day | INTRAMUSCULAR | PRN
Start: 2023-01-29 — End: 2023-01-29
  Administered 2023-01-29: 17:00:00 10 mL via INTRAVENOUS

## 2023-01-29 MED ORDER — sodium chloride 0.9 % infusion
Freq: Every day | INTRAVENOUS | PRN
Start: 2023-01-29 — End: 2023-01-29
  Administered 2023-01-29: 14:00:00 25 mL/h via INTRAVENOUS

## 2023-01-29 MED ORDER — heparin lock flush 100 unit/mL syringe 500 Units
100 | Freq: Every day | INTRAVENOUS | PRN
Start: 2023-01-29 — End: 2023-01-29
  Administered 2023-01-29: 17:00:00 500 [IU]

## 2023-01-29 MED ORDER — sodium chloride 0.9% flush 10 mL
Freq: Every day | INTRAMUSCULAR | Status: AC | PRN
Start: 2023-01-29 — End: 2023-01-30
  Administered 2023-01-29: 14:00:00 10 mL via INTRAVENOUS

## 2023-01-29 MED ORDER — dexAMETHasone (DECADRON) tablet 12 mg
4 | Freq: Once | ORAL | Status: AC
Start: 2023-01-29 — End: 2023-01-29
  Administered 2023-01-29: 14:00:00 12 mg via ORAL

## 2023-01-29 MED ORDER — EPINEPHrine injection 0.3 mg
1 | Freq: Every day | INTRAMUSCULAR | PRN
Start: 2023-01-29 — End: 2023-01-29

## 2023-01-29 MED ORDER — palonosetron (ALOXI) injection 0.25 mg
0.25 | Freq: Once | INTRAVENOUS | Status: AC
Start: 2023-01-29 — End: 2023-01-29
  Administered 2023-01-29: 14:00:00 0.25 mg via INTRAVENOUS

## 2023-01-29 MED ORDER — gemcitabine (GEMZAR) 1,600 mg in sodium chloride 0.9 % 100 mL chemo infusion
200 | Freq: Once | INTRAVENOUS | Status: AC
Start: 2023-01-29 — End: 2023-01-29
  Administered 2023-01-29: 15:00:00 1600 mg/m2 via INTRAVENOUS

## 2023-01-29 MED ORDER — LORazepam (ATIVAN) tablet 0.5 mg
0.5 | Freq: Four times a day (QID) | ORAL | PRN
Start: 2023-01-29 — End: 2023-01-29

## 2023-01-29 MED ORDER — aprepitant (CINVANTI) IV emulsion 130 mg
7.2 | Freq: Once | INTRAVENOUS | Status: AC
Start: 2023-01-29 — End: 2023-01-29
  Administered 2023-01-29: 14:00:00 130 mg via INTRAVENOUS

## 2023-01-29 MED ORDER — hydrocortisone sod succ (PF) (SOLU-CORTEF) injection 100 mg
100 | Freq: Every day | INTRAMUSCULAR | PRN
Start: 2023-01-29 — End: 2023-01-29

## 2023-01-29 MED FILL — AVASTIN 25 MG/ML INTRAVENOUS SOLUTION: 25 25 mg/mL | INTRAVENOUS | Qty: 34

## 2023-01-29 MED FILL — CINVANTI 7.2 MG/ML INTRAVENOUS EMULSION: 7.2 7.2 mg/mL | INTRAVENOUS | Qty: 18

## 2023-01-29 MED FILL — PALONOSETRON 0.25 MG/5 ML INTRAVENOUS SOLUTION: 0.25 0.25 mg/5 mL | INTRAVENOUS | Qty: 5

## 2023-01-29 MED FILL — DEXAMETHASONE 4 MG TABLET: 4 4 MG | ORAL | Qty: 3

## 2023-01-29 MED FILL — GEMCITABINE 200 MG INTRAVENOUS SOLUTION: 200 200 mg | INTRAVENOUS | Qty: 42.08

## 2023-01-29 MED FILL — CARBOPLATIN 10 MG/ML INTRAVENOUS SOLUTION: 10 10 mg/mL | INTRAVENOUS | Qty: 30

## 2023-01-29 NOTE — Nursing Note (Signed)
Pt ambulatory in infusion suite for GEM/CARBO/BEV infusion. Rooming and vitals complete. Pac accessed in port room and labs drawn. Labs reviewed and treatment ordered. Call light placed within reach. Pac checked for +br before starting treatment. Treatment administered as ordered. Pt tolerated well. Pt left treatment suite in stable condition. AVS declined

## 2023-01-29 NOTE — Nursing Note (Signed)
Anterior chest port-a-cath located on left side. Sterile prep of port-a-cath site done. Accessed PAC site with 20 gauge huber needle 3/4 inch. Primed with 0.9% Sodium Chloride using 10 ml syringe. Blood return was noted. Drew blood and wasted. Drew blood for labs ordered: Green, gold & lavender tubes. Obtained a urine sample, red/yellow swirl tube. Tegaderm dressing applied.  Flushed with 10 ml of Sodium Chloride using 10 ml syringe. Notified infusion RN, Dorthea C[place] that patient is ready for her appointment.

## 2023-01-30 ENCOUNTER — Inpatient Hospital Stay: Admit: 2023-01-30 | Discharge: 2023-01-30 | Payer: Medicare (Managed Care)

## 2023-01-30 DIAGNOSIS — C569 Malignant neoplasm of unspecified ovary: Secondary | ICD-10-CM

## 2023-01-30 MED ORDER — OMNIPAQUE (iohexol) 350 mg iodine/mL 100 mL
350 | Freq: Once | INTRAVENOUS | Status: AC | PRN
Start: 2023-01-30 — End: 2023-01-30
  Administered 2023-01-30: 13:00:00 100 mL via INTRAVENOUS

## 2023-01-30 MED ORDER — OMNIPAQUE (iohexol) 240 mg iodine/mL 50 mL
240 | Freq: Once | INTRAVENOUS | Status: AC | PRN
Start: 2023-01-30 — End: 2023-01-30
  Administered 2023-01-30: 13:00:00 50 mL via ORAL

## 2023-01-30 MED FILL — OMNIPAQUE 350 MG IODINE/ML INTRAVENOUS SOLUTION: 350 350 mg iodine/mL | INTRAVENOUS | Qty: 100

## 2023-01-30 MED FILL — OMNIPAQUE 240 MG IODINE/ML INTRAVENOUS SOLUTION: 240 240 mg iodine/mL | INTRAVENOUS | Qty: 50

## 2023-02-02 ENCOUNTER — Ambulatory Visit: Payer: Medicare (Managed Care)

## 2023-02-05 ENCOUNTER — Ambulatory Visit: Admit: 2023-02-05 | Payer: Medicare (Managed Care)

## 2023-02-05 ENCOUNTER — Institutional Professional Consult (permissible substitution): Admit: 2023-02-05 | Payer: Medicare (Managed Care)

## 2023-02-05 DIAGNOSIS — C569 Malignant neoplasm of unspecified ovary: Secondary | ICD-10-CM

## 2023-02-05 DIAGNOSIS — Z452 Encounter for adjustment and management of vascular access device: Secondary | ICD-10-CM

## 2023-02-05 LAB — CBC
Hematocrit: 29.5 % — ABNORMAL LOW (ref 35.0–45.0)
Hemoglobin: 10 g/dL — ABNORMAL LOW (ref 11.7–15.5)
MCH: 33.6 pg — ABNORMAL HIGH (ref 27.0–33.0)
MCHC: 33.8 g/dL (ref 32.0–36.0)
MCV: 99.4 fL (ref 80.0–100.0)
MPV: 7.7 fL (ref 7.5–11.5)
Platelets: 132 10*3/uL — ABNORMAL LOW (ref 140–400)
RBC: 2.97 10*6/uL — ABNORMAL LOW (ref 3.80–5.10)
RDW: 17.4 % — ABNORMAL HIGH (ref 11.0–15.0)
WBC: 3.1 10*3/uL — ABNORMAL LOW (ref 3.8–10.8)

## 2023-02-05 LAB — COMPREHENSIVE METABOLIC PANEL
ALT: 40 U/L (ref 7–52)
AST: 32 U/L (ref 13–39)
Albumin: 4.3 g/dL (ref 3.5–5.7)
Alkaline Phosphatase: 73 U/L (ref 36–125)
Anion Gap: 7 mmol/L (ref 3–16)
BUN: 16 mg/dL (ref 7–25)
CO2: 28 mmol/L (ref 21–33)
Calcium: 9.5 mg/dL (ref 8.6–10.3)
Chloride: 99 mmol/L (ref 98–110)
Creatinine: 0.69 mg/dL (ref 0.60–1.30)
EGFR: 87
Glucose: 138 mg/dL — ABNORMAL HIGH (ref 70–100)
Osmolality, Calculated: 281 mOsm/kg (ref 278–305)
Potassium: 4.7 mmol/L (ref 3.5–5.3)
Sodium: 134 mmol/L (ref 133–146)
Total Bilirubin: 0.4 mg/dL (ref 0.0–1.5)
Total Protein: 6.5 g/dL (ref 6.4–8.9)

## 2023-02-05 LAB — DIFFERENTIAL
Basophils Absolute: 28 /uL (ref 0–200)
Basophils Relative: 0.9 % (ref 0.0–1.0)
Eosinophils Absolute: 19 /uL (ref 15–500)
Eosinophils Relative: 0.6 % (ref 0.0–8.0)
Lymphocytes Absolute: 818 /uL — ABNORMAL LOW (ref 850–3900)
Lymphocytes Relative: 26.4 % (ref 15.0–45.0)
Monocytes Absolute: 109 /uL — ABNORMAL LOW (ref 200–950)
Monocytes Relative: 3.5 % (ref 0.0–12.0)
Neutrophils Absolute: 2127 /uL (ref 1500–7800)
Neutrophils Relative: 68.6 % (ref 40.0–80.0)
nRBC: 0 /100 WBC (ref 0–0)

## 2023-02-05 LAB — MAGNESIUM: Magnesium: 1.7 mg/dL (ref 1.5–2.5)

## 2023-02-05 MED ORDER — gemcitabine (GEMZAR) 1,600 mg in sodium chloride 0.9 % 100 mL chemo infusion
Freq: Once | INTRAVENOUS | Status: AC
Start: 2023-02-05 — End: 2023-02-05
  Administered 2023-02-05: 16:00:00 1600 mg/m2 via INTRAVENOUS

## 2023-02-05 MED ORDER — LORazepam (ATIVAN) tablet 1 mg
1 | Freq: Four times a day (QID) | ORAL | PRN
Start: 2023-02-05 — End: 2023-02-05

## 2023-02-05 MED ORDER — heparin lock flush 100 unit/mL syringe 500 Units
100 | Freq: Every day | INTRAVENOUS | PRN
Start: 2023-02-05 — End: 2023-02-05
  Administered 2023-02-05: 17:00:00 500 [IU]

## 2023-02-05 MED ORDER — sodium chloride 0.9% flush 10 mL
Freq: Every day | INTRAMUSCULAR | Status: AC | PRN
Start: 2023-02-05 — End: 2023-02-06
  Administered 2023-02-05: 14:00:00 10 mL via INTRAVENOUS

## 2023-02-05 MED ORDER — pegfilgrastim (NEULASTA Kit) delayed injection 6 mg
6 | Freq: Once | SUBCUTANEOUS | Status: AC
Start: 2023-02-05 — End: 2023-02-05
  Administered 2023-02-05: 17:00:00 6 mg via SUBCUTANEOUS

## 2023-02-05 MED ORDER — sodium chloride 0.9% flush 10 mL
Freq: Every day | INTRAMUSCULAR | PRN
Start: 2023-02-05 — End: 2023-02-05
  Administered 2023-02-05 (×2): 10 mL via INTRAVENOUS

## 2023-02-05 MED ORDER — sodium chloride 0.9 % infusion
Freq: Every day | INTRAVENOUS | PRN
Start: 2023-02-05 — End: 2023-02-05
  Administered 2023-02-05: 15:00:00 25 mL/h via INTRAVENOUS

## 2023-02-05 MED ORDER — dexAMETHasone (DECADRON) tablet 12 mg
4 | Freq: Once | ORAL | Status: AC
Start: 2023-02-05 — End: 2023-02-05
  Administered 2023-02-05: 15:00:00 12 mg via ORAL

## 2023-02-05 MED ORDER — LORazepam (ATIVAN) injection 1 mg
2 | Freq: Four times a day (QID) | INTRAMUSCULAR | PRN
Start: 2023-02-05 — End: 2023-02-05

## 2023-02-05 MED FILL — DEXAMETHASONE 4 MG TABLET: 4 4 MG | ORAL | Qty: 3

## 2023-02-05 MED FILL — GEMCITABINE 200 MG INTRAVENOUS SOLUTION: 200 200 mg | INTRAVENOUS | Qty: 15.78

## 2023-02-05 MED FILL — NEULASTA ONPRO 6 MG/0.6 ML WITH WEARABLE SUBCUTANEOUS INJECTOR: 6 6 mg/0.6 mL | SUBCUTANEOUS | Qty: 0.6

## 2023-02-05 NOTE — Nursing Note (Signed)
Arrived for cycle 6 day 8 Gemzar/Neulasta POD.  Meds and allergies reviewed with patient. Fall risk assessment completed.  Pt is not considered a fall risk.  Call light placed within reach of patient.  Lab results reviewed are within treatment parameters.  Treatment administered as ordered.  Tolerated infusion well and left department ambulatory with the AVS.  Future infusion appointments reviewed with patient prior to DC.  Pt states she is planning to have future infusions for maintenance in [place].  Infusion appointment confirmed for 02/19/23.

## 2023-02-05 NOTE — Telephone Encounter (Signed)
Patient called and verified that she got her appointment change

## 2023-02-05 NOTE — Progress Notes (Signed)
02/05/23 0845   Specimen Collection Status   Specimen Collection Unit   Port A Cath Left Chest   No placement date or time found.   Orientation: Left  Location: Chest   Line Intervention Reaccessed   Site Assessment No problems identified   Line Status Blood return noted;Flushed   Needle Size 20 gauge, 0.75 huber   Dressing Type Transparent   Dressing Status Clean;Dry;Intact   Dressing Intervention New dressing   Accessed/Deaccessed by: [facility]   Access Attempts 1   Flush Performed Yes        Allergies reviewed with patient.

## 2023-02-05 NOTE — Telephone Encounter (Signed)
Called and left voicemail asking for call back

## 2023-02-06 ENCOUNTER — Ambulatory Visit: Payer: Medicare (Managed Care)

## 2023-02-06 NOTE — Progress Notes (Deleted)
History of Present Illness  Kelsey Sullivan is a 82 y.o. female with   No chief complaint on file.    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 She declined adjuvant therapy. In 01/2018, she experienced mid abdominal pain, and seen by PCP and ED, with CT of abdomen and pelvis showing interval development of necrotic retroperitoneal lymphadenopathy with largest node measuring 4.9  4.2 x 4.8 cm. Her CA 125 on 02/18/18 was 197.8.  She underwent a ct-guided nodal biopsy on 02/26/18, with pathology confirming recurrent disease.  She completed 6 cycles of chemotherapy with Carboplatin and Paclitaxel-weekly (C6D1 was 08/27/18). CA 125 was 8.7 at C6.     She underwent CT CAP on 09/22/2018 revealed ned in chest and interval decrease in size of a left periaortic necrotic lymph node from 1.8 to 1.6 cm. 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 PET on 10/15/18 shows reassuring intraabdominal findings with resolution of the previous hypermetabolic para-aortic lymph nodes and hepatic dome lesions, but did note uptake within the thyroid. She underwent thyroid US on 10/20/18 with a 1.2 cm irregular hypoechoic nodule in the left thyroid lobe is suspicious. Recommended FNA for further evaluation, but she did not get this done.    She initiated maintenance rubraca 11/2018 but ultimately elected to stop in 02/2019 due to side effects.     She was seen for surveillance 08/21/2022 with an increase in her CA 125 to 8.5 - CT CAP and PET  were checked and consistent with platinum sensitive recurrence #2.   CT CAP on 08/25/2022 noted 2.6 cm lesion along the dome of the right hepatic lobe may represent a peritoneal lesion along the undersurface of the diaphragm rather than a true hepatic parenchymal lesion. 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   PET 08/29/2022 noted two FDG avid foci at the superior surface of right hepatic lobe, favored to be peritoneal nodules along the undersurface of right hemidiaphragm, concerning for metastases.     She proceeded on Carbo/gem/bev, C1 09/16/22. C2 was delayed to 10/16/2022 due to neutropenia, neulasta added for C2.    Patient was admitted from 11/12-11/13/23 to the hospital for extensive LUE/shoulder/neck DVT and was started on a heparin gtt and discharged home on eliquis. Per vascular surgery, there were no acute interventions needed besides anticoagulation. She was on eliquis 10 mg BID for 7 days (11/13-11/19) followed by eliquis 5mg  BID for 6 months. Per surgical oncology, she only needs repeat imaging if the swelling gets worse while she is on [facility] or if the port becomes non-functional.     She underwent CTs after C3 on 11/17/22 which showed decreased disease.     She was admitted 11/21/22-11/24/22 for management of anemia and leukocytosis. Throughout admission, patient had negative infectious workup with improving leukocytosis, remained afebrile. Hemoglobin stable at 9.1 on discharge. Patient was discharged with instructions to restart eliquis 2.5mg  BID (prophylactic dosing) until repeat  CBC with Plt > 50, at which time she can resume therapeutic dosing of eliquis 5mg  BID (she is now on 5 mg again).    There are no changes to the cancer history HPI above.       She is seen today for problem visit.  Kelsey Sullivan is doing well. She reports that she plans to move to [facility] full time, and is wanting to pursue her  further treatment at [facility].  Reports otherwise stable epistaxis.  She denies any current abdominal/pelvic pain.   She denies any bloating or fullness.  She reports normal bowel/bladder habits.  She denies any GU concerns. Denies any dysuria, frequency, hematuria, flank pain or fevers.  She denies any vaginal bleeding or discharge.   She denies neuropathy.   She reports no leg swelling.   Her appetite is good, and reports no nausea or vomiting.   She had some mild bone pain in hips following chemo, controlled with tylenol. Resolved now.  Compliant with Eliquis 5mg  BID.    There are no changes to the cancer history HPI above.       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 Leonard Schwartz with Cts prior  Carbo/gem/bev    Current disease status: Neg for CS mutation, no VUS, lifetime remaining breast cancer risk -3.4%  Genetics: negative for mutations, breast cancer lifetime risk 3.4%     Malignant neoplasm of ovary (CMS-HCC)   12/28/2012 Pathology    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    Omentum-biopsy for frozen section-metastatic adenocarcionma.  Residual right IP ligament-benign smooth muscle and fibroadiopse tissue containing thick walled blood vessels.    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  Anterior cul de sac biopsy-benign fibroadipose tissue.   Posterior cul de sac biopsy-metastatic adenocarcinoma.  Right pelvic side will biopsy- benign fibroadipose tissue.  Left pelvic side wall, right gutter and Left gutter-benign fibroadipose tissue.  Omentectomy- metastatic adenocarcinoma  LND-negative     12/28/2012 Surgery    BSO, omentectomy, LND     12/28/2012  Initial Diagnosis    Malignant neoplasm of both ovaries (CMS Dx)     01/08/2013 - 01/08/2013 Systemic Therapy (Oral and IV)    Patient refused chemotherapy and did alternative treatment including a raw diet for 1 year.       04/06/2017 Imaging    CT [name]- mild small bowel obstruction with transition point in the left pelvis adjacent to the anterior abdominal wall. Low density in the common femoral veins bilaterally thought likely to represent flow artificat, may consider follow up with lomer extremity Doppler to ensure there is no underlying deep venous thrombosis.     01/29/2018 Imaging    CT [name]-  Interval development of necrotic retroperitoneal lymphadenopathy with largest node measuring 4.9  4.2 x 4.8 cm. Origin is indeterminate.  Options would include CT directed biopsy as well as PET/CT.     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     03/04/2018 Imaging    PET/CT-  FDG avid retroperitoneal lymphadenopathy as described above and an FDG avid lesion over the dome of the liver most likely a peritoneal implant rather than a liver metastasis.  No other focal areas of FD avid malignancy are found. Specifically, no lymph node activity is found outside of the retroperitoneum and there is no evidence for FDG avid pulmonary, skeletal or hepatic parenchymal metastasis.     04/01/2018 Cancer Staged     Cancer Staging   Malignant neoplasm of ovary (CMS-HCC)  Staging form: Ovary, Fallopian Tube, And Primary Peritoneal Carcinoma, AJCC 8th Edition  - Clinical: FIGO Stage IIIC - Signed by Mikle Bosworth, MD on 04/01/2018         04/12/2018 Procedure    IR port placed       04/15/2018 Genetic Testing    Myraid Testing-Neg     04/16/2018 - 08/27/2018 Systemic Therapy  (Oral and IV)    Treatment Summary   Treatment goal Disease control   Plan Name OP Gyn CARBOplatin AUC 6 / PACLitaxel Weekly   Status Active   Start Date 04/16/2018   End Date 09/10/2018 (Planned)   Provider Mikle Bosworth, MD   Chemotherapy PACLitaxel (TAXOL) 120 mg in sodium chloride 0.9 % 250 mL chemo infusion, 80 mg/m2 = 120 mg, Intravenous, Once, 4 of 6 cycles    CARBOplatin (PARAPLATIN) 480 mg in dextrose 5% in water (D5W) 250 mL chemo infusion, 480 mg, Intravenous, Once, 4 of 6 cycles  Dose modification: 497 mg (original dose 484.8 mg, Cycle 2, Reason: Other (See Comments))         04/16/2018:   C1 weekly taxol 80 mg/m2 D1,8,15, Carboplatin AUC 6 D1 q 21 days. PCEs on thurs, txt on fridays. EXAMS ON EVEN CYCLES     CA 125: 523.4     Genetic testing: Neg for CS mutation, no VUS, lifetime remaining breast cancer risk -3.4%    05/07/2018:   C2 weekly taxol 80 mg/m2 D1,8,15, Carboplatin AUC 6 D1 q 21 days. PCEs on thurs, txt on fridays. EXAMS ON EVEN CYCLES     CA 125: 162.2    05/20/2018:   Early PCE due to scheduling, due 6/21    05/28/2018  Delay C3 due to low ANC     CA 125: 25    06/04/2018  C3 weekly taxol 80 mg/m2 D1,8,15, Carboplatin AUC 6 D1 q 21 days. EXAMS ON EVEN CYCLES.      CA 125    06/25/2018:   HOLD chemo due to low anc (1047)- defer to next week, and change to q 28 days cycle to build in an off week to allow for counts to recover     CA 125: 10.6    07/02/2018:   C4 weekly taxol 80 mg/m2 D1,8,15, Carboplatin AUC 6 D1 q 28 days. EXAMS ON EVEN CYCLES.  Review her genetics testing results      CA 125 7.2    07/30/2018:  C5 weekly taxol 80 mg/m2 D1,8,15, Carboplatin AUC 6 D1 q 28 days. Neulasta Day 16      EXAMS ON EVEN CYCLES.      CA 125: 11.4    08/27/2018:   C6 weekly taxol 80 mg/m2 D1,8,15, Carboplatin AUC 6 D1 q 28 days. Neulasta Day 16. Order Ct CAP. RTC 10/11 (3 weeks)     EXAMS ON EVEN CYCLES. Discussed  option of maintenance parp I- pt not decided (doesn't want to feel tired  anymore)     CA 125: 8.7       05/28/2018 Tumor Markers    Tumor markers tested:  CA 125.  Results:   25.     06/25/2018 Tumor Markers    Tumor markers tested:  CA 125.  Results:   10.6.     07/02/2018 Tumor Markers    Tumor markers tested:  CA 125.  Results:   7.2.     07/07/2018 Hospital Admission    Admit date: 7/31 - 07/13/18  Admission diagnosis: Partial SBO  Additional comments: On 7/31 she experienced abdominal pain at home that was sharp on her LLQ and had significant diarrhea and one episode of emesis. She came to be evaluated at the [facility] clinic. She has a history of prior small bowel obstruction. She was give IL NS, 2mg  morphine IV, and 4mg  Zofran IV in clinic and admitted for further observation. The abdominal XR showed dilated bowel loops, concerning for obstruction. CT abdomen/pelvis showed dilation of the small bowel with transition point seen in the anterior mid abdomen near the midline.     07/07/2018 Imaging    Imaging Completed:  X-ray of  abdomen  Result: Nonspecific bowel gas pattern. The presence of multiple air-fluid levels in small bowel raises question of early or partial small bowel obstruction.     07/07/2018 Imaging    CTAP:Small bowel obstruction with transition point in the anterior mid abdomen near the midline. Small amount of free fluid in the abdomen, which is nonspecific. 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  Peritoneum: Small amount of free fluid in the abdomen. A para-aortic density with central low attenuation measures 2.5 x 2 cm and is significantly decreased in size from prior.       07/30/2018 Tumor Markers    Tumor markers tested:  CA 125.  Results:   11.4.     08/27/2018 Tumor Markers    Tumor markers tested:  CA 125.  Results:   8.7.     09/20/2018 Procedure    Port removed: While the skin surface appeared inflamed, there was no evidence of infection in the port reservoir.       09/22/2018 Imaging    CT  Chest-No evidence of thoracic metastatic disease.  Suspected small seroma in the base of the right neck.  Necrotic metastatic lesion could also have this appearance but considered less likely.    [name]- Interval decrease in size of a left periaortic necrotic lymph node. No evidence of new metastatic disease.     10/15/2018 Imaging    Imaging Completed:  PET scan of  whole body  Result: Resolution of the previous hypermetabolic para-aortic lymph nodes and hepatic dome lesions.  Hypermetabolic focus within the left thyroid lobe.  Consider further evaluation with ultrasound, if clinically indicated.       10/19/2018 Imaging    Imaging Completed:  Ultrasound of  neck  Result: 1.2 cm irregular hypoechoic nodule in the left thyroid lobe is suspicious. Recommend FNA for further evaluation.  1.0 cm nodule right thyroid lobe containing macrocalcification is indeterminate. Recommend follow-up thyroid ultrasound in one year to evaluate for stability.     10/21/2018 Tumor Markers    Tumor markers tested:  CA 125.  Results:   4.8.     11/12/2018 Tumor Markers    Tumor markers tested:  CA 125  Results:   5.5     11/12/2018 - 12/03/2018 Systemic Therapy (Oral and IV)    11/12/2018: C1 rubraca 600 mg po BID. RTC 2 weeks for labs (CBC, CMP, 4 weeks for PCE #2). Pt plans to go to [place] and may leave before PCE and re-est care at [facility] (Dr. Edison Nasuti). Referral to ENT placed (pt cancelled appt for 11/22) and FNA was prev ordered for thyroid nodule- but has not been done.        03/02/2019 Imaging    CT Scan from [facility]    No evidence for recurrent or new neoplasm. Increased colonic stool could indicate constipation. Postoperative changes     06/13/2019 Tumor Markers    Tumor markers tested: CA 125 Results:   5.0.     09/15/2019 Tumor Markers    CA 125-6.1     05/31/2020 Tumor Markers    Ca 125-5.9     08/16/2020 Tumor Markers    CA 125-6.2     02/06/2021 Tumor Markers    CA 125-4.6     08/25/2022 Imaging    CT a/p: 2.6 cm lesion  along the dome of the right hepatic lobe may represent a peritoneal lesion along the undersurface of the diaphragm rather than a true hepatic parenchymal lesion. 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     CT chest: No evidence of intrathoracic metastatic disease.      08/29/2022 Imaging    PET: 1.  Two FDG avid foci at the superior surface of right hepatic lobe, favored to be peritoneal nodules along the undersurface of right hemidiaphragm, concerning for metastases.      09/15/2022 Procedure    09/15/2022:   scheduled for Power Port Insertion with Dr. Redmond Pulling      09/16/2022 -  Systemic Therapy (Oral and IV)    09/16/2022:   C1 carboplatin AUC4, gem 1000 mg/m2 (D1/8), Bev D1 q 21 days  Treatment Summary   Treatment goal Disease control   Plan Name OP Gyn CARBOplatin AUC 4 / Gemcitabine / Bevacizumab   Status Active   Start Date 09/16/2022   End Date 03/12/2023 (Planned)   Provider Desmond Lope, MD   Chemotherapy bevacizumab (AVASTIN) 850 mg in sodium chloride 0.9 % 100 mL chemo infusion, 15 mg/kg = 850 mg, Intravenous, Once, 6 of 8 cycles  Administration: 850 mg (09/16/2022), 850 mg (10/16/2022), 850 mg (11/06/2022), 850 mg (11/27/2022), 850 mg (01/01/2023)  gemcitabine (GEMZAR) 1,600 mg in sodium chloride 0.9 % 100 mL chemo infusion, 1,000 mg/m2 = 1,600 mg, Intravenous, Once, 6 of 6 cycles  Administration: 1,600 mg (09/16/2022), 1,600 mg (09/23/2022), 1,600 mg (10/16/2022), 1,600 mg (10/23/2022), 1,600 mg (11/06/2022), 1,600 mg (11/13/2022), 1,600 mg (11/27/2022), 1,600 mg (12/04/2022), 1,600 mg (01/01/2023), 1,600 mg (01/08/2023)            09/16/2022 Tumor Markers    Tumor markers tested:  CA 125  Results:   7.5     10/07/2022 Tumor Markers    CA125: 7.9     10/16/2022 Tumor Markers    CA 125: 7.9     11/06/2022 Tumor Markers    CA 125: 11.1     11/17/2022 Imaging    Imaging Completed:  CT Scan of  chest, abdomen, and  pelvis  AP: Interval decrease in size of the peritoneal nodule in the dome of the right hepatic lobe. No evidence of new  metastatic disease.   CHEST: No evidence of intrathoracic metastatic disease.      11/27/2022 Tumor Markers    CA 125: 11.4       Past Medical History  She  has a past medical history of Bowel obstruction (CMS-HCC) (2018), Ovarian cancer (CMS-HCC) (2014), and Thyroid disease.  Past Medical History:   Diagnosis Date    Bowel obstruction (CMS-HCC) 2018    Ovarian cancer (CMS-HCC) 2014    Thyroid disease      Past Surgical History  Past Surgical History:   Procedure Laterality Date    ABDOMINOPLASTY  1999    BLEPHAROPLASTY  2000    DILATION AND CURETTAGE OF UTERUS  [place] VASCULAR ACCESS N/A 09/15/2022    Procedure: INSERTION POWER PORT A CATH;  Surgeon: Cyndi Bender, MD;  Location: [facility] OR;  Service: General;  Laterality: N/A;  Left Subclavian    KNEE ARTHROPLASTY Right     ROTATOR CUFF REPAIR      TRANSUMBILICAL AUGMENTATION MAMMAPLASTY      VAGINAL HYSTERECTOMY  1979     Family History  No family history on file.    Family cancer history for ovarian, uterine and colon cancer is negative other than above.     Social History  Social History     Socioeconomic History    Marital status: Married     Spouse name: Not on file    Number of children: Not on file    Years of education: Not on file    Highest education level: Not on file   Occupational History    Not on file   Tobacco Use    Smoking status: Some Days     Packs/day: 0     Types: Cigarettes    Smokeless tobacco: Never    Tobacco comments:     Smokes 1 pack every three days    Vaping Use    Vaping Use: Never used   Substance and Sexual Activity    Alcohol use: Yes     Alcohol/week: 14.0 standard drinks of alcohol     Types: 14 Shots of liquor per week    Drug use: No    Sexual activity: Not Currently   Other Topics Concern    Caffeine Use Yes    Occupational Exposure No     Exercise Yes    Seat Belt Yes   Social History Narrative    Mammogram-6 years ago and it was normal. Pt stated she isn't getting them anymore.      Colonoscopy-10 years ago        Pt reports she can lie flat in bed without SOB.    Pt states she can walk a flight of stairs and city block without chest pain and SOB>       Social Determinants of Health     Financial Resource Strain: Not on file   Food Insecurity: No Food Insecurity (01/15/2023)    Yearly Questionnaire     Do you need any assistance with obtaining housing, meals, medication, transportation or medical equipment?: No     Assistance needed for:: Not on file   Transportation Needs: No Transportation Needs (01/15/2023)    Yearly Questionnaire     Do you need any assistance with obtaining housing, meals, medication, transportation or medical equipment?: No     Assistance needed for:: Not on file  Physical Activity: Not on file   Stress: Not on file   Social Connections: Not on file   Intimate Partner Violence: Not At Risk (11/21/2022)    Humiliation, Afraid, Rape, and Kick questionnaire     Fear of Current or Ex-Partner: No     Emotionally Abused: No     Physically Abused: No     Sexually Abused: No   Housing Stability: Low Risk  (01/15/2023)    Yearly Questionnaire     Do you need any assistance with obtaining housing, meals, medication, transportation or medical equipment?: No     Assistance needed for:: Not on file     Past OB/GYN History  G3P2  She reports menarche at age 23 and menopause at age 89-surgical.  She denies a history of STIs.  She denies a history of abnormal cervical cytology and reports her last cytologic examination she is unsure of. She has taken HRT.     Health maintenance:  Mammogram: Date 6 years ago Results normal  Colonoscopy: Date 10 years ago Results normal    Allergies  She is allergic to monosodium glutamate, nsaids (non-steroidal anti-inflammatory drug), sulfamethoxazole-trimethoprim, celecoxib, and metoclopramide.    BMI  There is  no height or weight on file to calculate BMI.    Disease Status: No evidence of disease/remission (08/21/2022 12:27 PM)              Histories  She has a past medical history of Bowel obstruction (CMS-HCC) (2018), Ovarian cancer (CMS-HCC) (2014), and Thyroid disease.    She has a past surgical history that includes Dilation and curettage of uterus (1979); Vaginal hysterectomy (1979); Elbow surgery (1990); Eye surgery (1994); Abdominoplasty (1999); Blepharoplasty (2000); Transumbilical augmentation mammaplasty; Rotator cuff repair; Knee Arthroplasty (Right); and insertion catheter vascular access (N/A, 09/15/2022).    Her family history is not on file.    She reports that she has been smoking cigarettes. She has never used smokeless tobacco. She reports current alcohol use of about 14.0 standard drinks of alcohol per week. She reports that she does not use drugs.    Allergies  Monosodium glutamate, Nsaids (non-steroidal anti-inflammatory drug), Sulfamethoxazole-trimethoprim, Celecoxib, and Metoclopramide    Medications  Outpatient Encounter Medications as of 02/06/2023   Medication Sig Dispense Refill    apixaban (ELIQUIS) 5 mg Tab Take 1 tablet (5 mg total) by mouth 2 times a day. 180 tablet 1    ascorbic acid, vitamin C, (VITAMIN C) 1000 MG tablet Take 1 tablet (1,000 mg total) by mouth daily.      cetirizine 10 mg Cap Take 10 mg by mouth daily.      cholecalciferol, vitamin D3, (VITAMIN D3) 1000 units tablet Take 1 tablet (1,000 Units total) by mouth daily. 30 tablet 0    dexAMETHasone (DECADRON) 4 MG tablet Take 8 mg by mouth daily with food on day 2, 3, and 4 of chemotherapy cycle 18 tablet 4    GLUTATHIONE-L ORAL Take 1 teaspoonful by mouth daily.      IODINE ORAL Take 50 mg/day by mouth.      Lactobac no.41/Bifidobact no.7 (PROBIOTIC-10 ORAL) Take 1 capsule by mouth daily. [employer] brand      levothyroxine (SYNTHROID) 75 MCG tablet Take 1 tablet (75 mcg total) by mouth every morning before breakfast.       lisinopriL (PRINIVIL) 10 MG tablet Take 2 tablets (20 mg total) by mouth daily. 60 tablet 3    omeprazole (PRILOSEC) 20 MG capsule Take 1  capsule (20 mg total) by mouth in the morning and at bedtime.      sodium chloride (OCEAN) 0.65 % nasal spray Use 1 spray into each nostril if needed. 44 mL 0    UNABLE TO FIND Take 30 drops by mouth daily. Multimineral      UNABLE TO FIND Take 20 drops by mouth daily. Med Name: Lurena Joiner Drops      UNABLE TO FIND Take 2 capsules by mouth daily. Med Name: Body Bio      vit b complex w-b 12 tablet Take 1 tablet by mouth daily.      ZINC CITRATE ORAL Take 1 tablet by mouth daily.       No facility-administered encounter medications on file as of 02/06/2023.      Review of Systems  see HPI    Vitals  There were no vitals taken for this visit.    Physical Exam   Vitals reviewed.  Constitutional: She is oriented to person, place, and time. She appears well-developed.   HENT:   Head: Atraumatic.   Nose: Nose normal.   Neck: No thyromegaly present.   Cardiovascular: Normal rate.   Pulmonary/Chest: Effort normal.   Abdominal: Normal appearance. She exhibits no distension.   Genitourinary:    Genitourinary Comments: def     Musculoskeletal:         General: Normal range of motion.      Cervical back: Neck supple.   Neurological: She is alert and oriented to person, place, and time.   Skin: Skin is warm and dry.   Psychiatric: Her behavior is normal.        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    R  ovary and fallopain tube-  Serous adenocarcinoma, high grade, arising from the fallopian tube, infarcted consistent with torsion. Serous carcionma in situ.       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     Posterior cul de sac biopsy-metastatic adenocarcinoma.     Omentectomy- metastatic adenocarcinoma     LND-negative     Residual right IP ligament, Anterior cul de sac biopsy, right pelvic side will biopsy, left pelvic side wall, right gutter and left gutter-->benign    04/06/2017:   CT [name]- mild small bowel obstruction with transition point in the left pelvis adjacent to the anterior abdominal wall. Low density in the common femoral veins bilaterally thought likely to represent flow artificat, may consider follow up with lomer extremity Doppler to ensure there is no underlying deep venous thrombosis.    01/29/2018:   CTAP:  Interval development of necrotic retroperitoneal lymphadenopathy with largest node measuring 4.9  4.2 x 4.8 cm. Origin is indeterminate.  Options would include CT directed biopsy as well as PET/CT.  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  No other focal areas of FD avid malignancy are found. Specifically, no lymph node activity is found outside of the retroperitoneum and there is no evidence for FDG avid pulmonary, skeletal or hepatic parenchymal metastasis.    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 No retroperitoneal, intra-abdominal, pelvic or inguinal adenopathy, no evidence for peritoneal surface neoplastic involvement. 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 She completed 6 cycles of chemotherapy with Carboplatin and Paclitaxel-weekly (C6D1 was 08/27/18). CA 125 was 8.7 at C6.     She underwent CT CAP on 09/22/2018 revealed ned in chest and interval decrease in size of a left periaortic necrotic lymph node from 1.8 to 1.6 cm. 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     PET on 10/15/18 shows reassuring intraabdominal findings with resolution of the previous hypermetabolic para-aortic lymph nodes and hepatic dome lesions, but did note uptake within the thyroid. She underwent thyroid US on 10/20/18 with a 1.2 cm irregular hypoechoic nodule in the left thyroid lobe is suspicious. Recommend FNA for further evaluation.     She initiated maintenance rubraca 11/2018 but ultimately elected to stop in 02/2019 due to side effects.   Patient had reestarted drug at 500 mg po BID once she got down to [place]-. Was in [place] from 12/2018-05/2019. Reports her oncologist down in [place] had changed doses of Rubraca a few times, to 300mg  po BID but  decided to stop drug all together back in 02/2019 2/2 to decreased QOL. She does not wish to restart drug at this time. Last CT scan 03/02/2019 in [place], no evidence of recurrent or new neoplasm.     She was seen for surveillance 08/21/2022 with an increase in her CA 125 to 8.5 - CT CAP and PET were checked and consistent with platinum sensitive recurrence #2.     At this time, she has decided to proceed with chemotherapy.    She underwent repeat CTs following C3 on 11/17/22 which showed partial response with interval decrease in size of the peritoneal nodule in the dome of the right hepatic lobe NED in the chest.  She is seen today for early PCE Prior to C6 of Carboplatin/Gemcitabine/bevacizumab. Neulasta added to C2D- planned next week 2/15      She is tolerating chemotherapy appropriately well, and has experienced expected but manageable side effects. I have reviewed her labs and she is cleared chemotherapy. There is no evidence of progressive disease or excessive toxicity today, and we will continue treatment without dose modification. We will see her back for her next cycle of treatment, and she knows to call if questions or problems arise.       01/15/2023:   Early PCE- RTC 1 week C6, plan CTs and FU CB to reviw on 3/1, plan C1 maintenance bev (if CTs CR/PR)- and then she plans to move back to [place] to get her bev at home. No NP visit needed next week    01/22/2023:   Plan C6 carboplatin AUC4, gem 1000 mg/m2 (D1/8), Bev D1 q 21 days. 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 Neulasta D9 (started C2), LUE/neck DVTs 10/2022- on eliquis. CTs after C6, plan bev maintenance. plan CTs and FU CB to reviw on 3/1, plan C1 maintenance bev (if CTs CR/PR)- and then she plans to move back to [place] to get her bev at home  CA 125    . [facility]; Dr. Johny Drilling Fax [phone]) [place], [place]; Dr. Christella Hartigan Fax [phone])     __ CA 125 stable and normal   __ repeat CT scans after C6  __ENT referral placed for  nose bleeds (likely bev related)  __Neulasta added to D9 C2  __Lisinopril added for HTN 10/07/2022  __new LUE/neck DVT 12/2021- on eliquis- see below    VTE:  -Patient was admitted from 11/12-11/13/23 to the hospital for extensive LUE/shoulder/neck DVT and was started on a heparin gtt and discharged home on eliquis. Per vascular surgery, there were no acute interventions needed besides anticoagulation. She was on eliquis 10 mg BID for 7 days (11/13-11/19) followed by eliquis 5mg  BID for 6 months. Per surgical oncology, she only needs repeat imaging if the swelling gets worse while she is on [facility] or if the port becomes non-functional. Clot burden on exam is improved  - had two weeks of multiple nose bleeds on the eliquis but stopped taking the herbs that might have been interacting with her eliquis and that stopped her nose bleeds   __She is going to continue on her bev  __Referred to ENT now, no current nose bleed    Patient was admitted 11/21/22-11/24/22 for management of anemia and leukocytosis. She required plt transfusion during admission. Patient was discharged with instructions to restart eliquis 2.5mg  BID (prophylactic dosing) until repeat CBC with Plt > 50, at which time she can resume therapeutic dosing of eliquis 5mg  BID     Seen 12/18/22, compliant with eliquis 2.5mg  BID. CBC today Plt > 50, will resume therapeutic dosing of eliquis 5mg  BID   __ patient informed and eliquis 5mg  BID refilled 12/18/22    Thyroid Nodule:   Incidental finding on PET scan of hypermetabolic focus within left thyroid nodule. Ultrasound performed on 10/20/18 with 1.2 cm irregular hypoechoic nodule, appearing suspicious. Recommend FNA for further evaluation, which was ordered. Pt has not followed up to date.  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 Referral to ENT made, but patient did not see them. She followed up with her PCP and was started on Synthroid. Last TSH 5.91. Repeat TSH/T4/T3 labs wnl 11/2020  Genetics:  previously discussed her negative genetic testing results.           The HPI, ROS, physical exam, test results, and assessment & plan were reviewed and copied forward (with edits) from a note written by Dr. [name] on 12/18/22. I have reviewed and updated the history, physical exam, data, assessment, and plan of the note so that it reflects my evaluation and management of the patient.

## 2023-02-09 ENCOUNTER — Ambulatory Visit: Payer: Medicare (Managed Care)

## 2023-02-12 ENCOUNTER — Encounter

## 2023-02-12 ENCOUNTER — Ambulatory Visit: Payer: Medicare (Managed Care)

## 2023-02-12 NOTE — Progress Notes (Signed)
Patient calling in, attempted to return call, no answer, LVM.    Future Appointments   Date Time Provider [place]   02/19/2023  8:15 AM Port Chair 1 Bc3 [facility] PORT 3 [facility]   02/19/2023  8:45 AM Desmond Lope, MD [facility]   02/19/2023  9:45 AM CHAIR 10 INF 3 BC [facility] INF3 [facility]

## 2023-02-16 ENCOUNTER — Ambulatory Visit: Payer: Medicare (Managed Care)

## 2023-02-19 ENCOUNTER — Ambulatory Visit: Admit: 2023-02-19 | Discharge: 2023-02-19 | Payer: Medicare (Managed Care)

## 2023-02-19 ENCOUNTER — Other Ambulatory Visit: Admit: 2023-02-19 | Payer: Medicare (Managed Care)

## 2023-02-19 ENCOUNTER — Institutional Professional Consult (permissible substitution): Admit: 2023-02-19 | Payer: Medicare (Managed Care)

## 2023-02-19 ENCOUNTER — Ambulatory Visit: Admit: 2023-02-19 | Payer: Medicare (Managed Care)

## 2023-02-19 DIAGNOSIS — Z5111 Encounter for antineoplastic chemotherapy: Secondary | ICD-10-CM

## 2023-02-19 DIAGNOSIS — Z452 Encounter for adjustment and management of vascular access device: Secondary | ICD-10-CM

## 2023-02-19 DIAGNOSIS — C563 Malignant neoplasm of bilateral ovaries (CMS-HCC): Secondary | ICD-10-CM

## 2023-02-19 DIAGNOSIS — C569 Malignant neoplasm of unspecified ovary: Secondary | ICD-10-CM

## 2023-02-19 LAB — DIFFERENTIAL
Basophils Absolute: 33 /uL (ref 0–200)
Basophils Relative: 0.5 % (ref 0.0–1.0)
Eosinophils Absolute: 26 /uL (ref 15–500)
Eosinophils Relative: 0.4 % (ref 0.0–8.0)
Lymphocytes Absolute: 1125 /uL (ref 850–3900)
Lymphocytes Relative: 17.3 % (ref 15.0–45.0)
Monocytes Absolute: 618 /uL (ref 200–950)
Monocytes Relative: 9.5 % (ref 0.0–12.0)
Neutrophils Absolute: 4700 /uL (ref 1500–7800)
Neutrophils Relative: 72.3 % (ref 40.0–80.0)
nRBC: 0 /100 WBC (ref 0–0)

## 2023-02-19 LAB — CA 125: CA 125: 8 U/mL (ref 5.5–35.0)

## 2023-02-19 LAB — URINALYSIS W/RFL TO MICROSCOPIC
Bilirubin, UA: NEGATIVE
Blood, UA: NEGATIVE
Glucose, UA: NEGATIVE mg/dL
Ketones, UA: NEGATIVE mg/dL
Nitrite, UA: NEGATIVE
RBC, UA: 3 /HPF (ref 0–3)
Specific Gravity, UA: 1.017 (ref 1.005–1.035)
Squam Epithel, UA: 4 /HPF (ref 0–5)
Urobilinogen, UA: <2 mg/dL (ref 0.2–1.9)
WBC, UA: 9 /HPF — ABNORMAL HIGH (ref 0–5)
pH, UA: 6 (ref 5.0–8.0)

## 2023-02-19 LAB — CBC
Hematocrit: 26.5 % — ABNORMAL LOW (ref 35.0–45.0)
Hemoglobin: 9 g/dL — ABNORMAL LOW (ref 11.7–15.5)
MCH: 34.5 pg — ABNORMAL HIGH (ref 27.0–33.0)
MCHC: 33.8 g/dL (ref 32.0–36.0)
MCV: 102.1 fL — ABNORMAL HIGH (ref 80.0–100.0)
MPV: 8.5 fL (ref 7.5–11.5)
Platelets: 48 10*3/uL — ABNORMAL LOW (ref 140–400)
RBC: 2.59 10*6/uL — ABNORMAL LOW (ref 3.80–5.10)
RDW: 19.3 % — ABNORMAL HIGH (ref 11.0–15.0)
WBC: 6.5 10*3/uL (ref 3.8–10.8)

## 2023-02-19 LAB — COMPREHENSIVE METABOLIC PANEL
ALT: 21 U/L (ref 7–52)
AST: 24 U/L (ref 13–39)
Albumin: 4.3 g/dL (ref 3.5–5.7)
Alkaline Phosphatase: 112 U/L (ref 36–125)
Anion Gap: 9 mmol/L (ref 3–16)
BUN: 13 mg/dL (ref 7–25)
CO2: 26 mmol/L (ref 21–33)
Calcium: 9.1 mg/dL (ref 8.6–10.3)
Chloride: 100 mmol/L (ref 98–110)
Creatinine: 0.78 mg/dL (ref 0.60–1.30)
EGFR: 76
Glucose: 203 mg/dL — ABNORMAL HIGH (ref 70–100)
Osmolality, Calculated: 286 mOsm/kg (ref 278–305)
Potassium: 4.2 mmol/L (ref 3.5–5.3)
Sodium: 135 mmol/L (ref 133–146)
Total Bilirubin: 0.4 mg/dL (ref 0.0–1.5)
Total Protein: 6.6 g/dL (ref 6.4–8.9)

## 2023-02-19 LAB — MAGNESIUM: Magnesium: 1.6 mg/dL (ref 1.5–2.5)

## 2023-02-19 MED ORDER — sodium chloride 0.9% flush 10 mL
Freq: Every day | INTRAMUSCULAR | Status: AC | PRN
Start: 2023-02-19 — End: 2023-02-20
  Administered 2023-02-19: 12:00:00 10 mL via INTRAVENOUS

## 2023-02-19 MED ORDER — heparin lock flush 100 unit/mL syringe 500 Units
100 | Freq: Every day | INTRAVENOUS | Status: AC | PRN
Start: 2023-02-19 — End: 2023-02-20

## 2023-02-19 MED ORDER — sodium chloride 0.9% flush 10 mL
Freq: Every day | INTRAMUSCULAR | Status: AC | PRN
Start: 2023-02-19 — End: 2023-02-20
  Administered 2023-02-19: 13:00:00 10 mL via INTRAVENOUS

## 2023-02-19 MED ORDER — heparin lock flush 100 unit/mL syringe 500 Units
100 | Freq: Every day | INTRAVENOUS | Status: AC | PRN
Start: 2023-02-19 — End: 2023-02-20
  Administered 2023-02-19: 13:00:00 500 [IU] via INTRAVENOUS

## 2023-02-19 NOTE — Progress Notes (Signed)
02/19/23 0910   Port A Cath Left Chest   No placement date or time found.   Orientation: Left  Location: Chest   Line Intervention Deaccessed   Site Assessment No problems identified;Clean;Dry;Intact   Line Status Blood return noted;Heparin locked;Flushed   Needle Size 20 gauge, 0.75 huber   Dressing Type Transparent   Dressing Status Removed   Dressing Intervention Other (Comment)  (gauze and tape)   Accessed/Deaccessed by: [place]   Flush Performed Yes     Port with positive blood return.  Port flushed with 20 ml of normal saline and 5 ml of heparin 100 unit/ml. Needle removed and gauze and tape applied.

## 2023-02-19 NOTE — Nursing Note (Signed)
Per clinic infusion appointment cancelled for today.

## 2023-02-19 NOTE — Progress Notes (Addendum)
History of Present Illness  Kelsey Sullivan is a 82 y.o. 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 She declined adjuvant therapy. In 01/2018, she experienced mid abdominal pain, and seen by PCP and ED, with CT of abdomen and pelvis showing interval development of necrotic retroperitoneal lymphadenopathy with largest node measuring 4.9  4.2 x 4.8 cm. Her CA 125 on 02/18/18 was 197.8.  She underwent a ct-guided nodal biopsy on 02/26/18, with pathology confirming recurrent disease.  She completed 6 cycles of chemotherapy with Carboplatin and Paclitaxel-weekly (C6D1 was 08/27/18). CA 125 was 8.7 at C6.     She underwent CT CAP on 09/22/2018 revealed ned in chest and interval decrease in size of a left periaortic necrotic lymph node from 1.8 to 1.6 cm. 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 PET on 10/15/18 shows reassuring intraabdominal findings with resolution of the previous hypermetabolic para-aortic lymph nodes and hepatic dome lesions, but did note uptake within the thyroid. She underwent thyroid US on 10/20/18 with a 1.2 cm irregular hypoechoic nodule in the left thyroid lobe is suspicious. Recommended FNA for further evaluation, but she did not get this done.    She initiated maintenance rubraca 11/2018 but ultimately elected to stop in 02/2019 due to side effects.     She was seen for  surveillance 08/21/2022 with an increase in her CA 125 to 8.5 - CT CAP and PET were checked and consistent with platinum sensitive recurrence #2.   CT CAP on 08/25/2022 noted 2.6 cm lesion along the dome of the right hepatic lobe may represent a peritoneal lesion along the undersurface of the diaphragm rather than a true hepatic parenchymal lesion. 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   PET 08/29/2022 noted two FDG avid foci at the superior surface of right hepatic lobe, favored to be peritoneal nodules along the undersurface of right hemidiaphragm, concerning for metastases.     She proceeded on Carbo/gem/bev, C1 09/16/22. C2 was delayed to 10/16/2022 due to neutropenia, neulasta added for C2.    Patient was admitted from 11/12-11/13/23 to the hospital for extensive LUE/shoulder/neck DVT and was started on a heparin gtt and discharged home on eliquis. Per vascular surgery, there were no acute interventions needed besides anticoagulation. She was on eliquis 10 mg BID for 7 days (11/13-11/19) followed by eliquis 5mg  BID for 6 months. Per surgical oncology, she only needs repeat imaging if the swelling gets worse while she is on [facility] or if the port becomes non-functional.     She underwent CTs after C3 on 11/17/22 which showed decreased disease.     She was admitted 11/21/22-11/24/22 for management of anemia and leukocytosis. Throughout admission, patient had negative infectious workup with improving leukocytosis, remained afebrile. Hemoglobin stable at  9.1 on discharge. Patient was discharged with instructions to restart eliquis 2.5mg  BID (prophylactic dosing) until repeat CBC with Plt > 50, at which time she can resume therapeutic dosing of eliquis 5mg  BID (she is now on 5 mg again).    Now s/p 6 cycles of carbo/gem/bev. CT on 2/23 showed NED        01/30/2023:   CTAP: No visible metastatic disease. Peritoneum/Retroperitoneum: No  free fluid, focal fluid collections or free air. No nodularity or mass lesions. The previously noted subtle hypodensity in the right subphrenic region is not visible.   CTC: NED    There are no changes to the cancer history HPI above.       She is seen today for PCE  Kelsey Sullivan is doing well. She reports that she plans to move to [facility] full time, and is wanting to pursue her further treatment at [facility].  Reports epistaxis after C6 of last chemo. States that this resolved. Also reported some mouth sores that resolved after 4 days and valtrex.  She denies any current abdominal/pelvic pain.   She denies any bloating or fullness.  She reports normal bowel/bladder habits.  She denies any GU concerns. Denies any dysuria, frequency, hematuria, flank pain or fevers.  She denies any vaginal bleeding or discharge.   She denies neuropathy.   She reports no leg swelling.   Her appetite is good, and reports no nausea or vomiting.   She had some mild bone pain in hips following chemo, controlled with tylenol. Resolved now.  Compliant with Eliquis 5mg  BID.    There are no changes to the cancer history HPI above.       There are no changes to the cancer history HPI above.       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    Omentum-biopsy for frozen section-metastatic adenocarcionma.  Residual right IP ligament-benign smooth muscle  and fibroadiopse tissue containing thick walled blood vessels.    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  Anterior cul de sac biopsy-benign fibroadipose tissue.   Posterior cul de sac biopsy-metastatic adenocarcinoma.  Right pelvic side will biopsy- benign fibroadipose tissue.  Left pelvic side wall, right gutter and Left gutter-benign fibroadipose tissue.  Omentectomy- metastatic adenocarcinoma  LND-negative     12/28/2012 Surgery    BSO, omentectomy, LND     12/28/2012 Initial Diagnosis    Malignant neoplasm of both ovaries (CMS Dx)     01/08/2013 - 01/08/2013 Systemic Therapy (Oral and IV)    Patient refused chemotherapy and did alternative treatment including a raw diet for 1 year.       04/06/2017 Imaging    CT [name]- mild small bowel obstruction with transition point in the left pelvis adjacent to the anterior abdominal wall. Low density in the common femoral veins bilaterally thought likely to represent flow artificat,  may consider follow up with lomer extremity Doppler to ensure there is no underlying deep venous thrombosis.     01/29/2018 Imaging    CT [name]-  Interval development of necrotic retroperitoneal lymphadenopathy with largest node measuring 4.9  4.2 x 4.8 cm. Origin is indeterminate.  Options would include CT directed biopsy as well as PET/CT.     02/18/2018 Tumor Markers    CA 125:  197.8     02/26/2018 Biopsy    CT guided Left retroperitoneal node biopsy- focally involved by non-small cell malignancy- additional stains are diagnostic of non-small cell carcionoma and supportive of involve75ment by the patients reported previous known ovarian malignancy.     03/04/2018 Imaging    PET/CT-  FDG avid retroperitoneal lymphadenopathy as described above and an FDG avid lesion over the dome of the liver most likely a peritoneal implant rather than a liver metastasis.  No other focal areas of FD avid malignancy are found. Specifically, no lymph node activity is found  outside of the retroperitoneum and there is no evidence for FDG avid pulmonary, skeletal or hepatic parenchymal metastasis.     04/01/2018 Cancer Staged     Cancer Staging   Malignant neoplasm of ovary (CMS-HCC)  Staging form: Ovary, Fallopian Tube, And Primary Peritoneal Carcinoma, AJCC 8th Edition  - Clinical: FIGO Stage IIIC - Signed by Desmond Lope, MD on 04/01/2018         04/12/2018 Procedure    IR port placed       04/15/2018 Genetic Testing    Myraid Testing-Neg     04/16/2018 - 08/27/2018 Systemic Therapy (Oral and IV)    Treatment Summary   Treatment goal Disease control   Plan Name OP Gyn CARBOplatin AUC 6 / PACLitaxel Weekly   Status Active   Start Date 04/16/2018   End Date 09/10/2018 (Planned)   Provider Desmond Lope, MD   Chemotherapy PACLitaxel (TAXOL) 120 mg in sodium chloride 0.9 % 250 mL chemo infusion, 80 mg/m2 = 120 mg, Intravenous, Once, 4 of 6 cycles    CARBOplatin (PARAPLATIN) 480 mg in dextrose 5% in water (D5W) 250 mL chemo infusion, 480 mg, Intravenous, Once, 4 of 6 cycles  Dose modification: 497 mg (original dose 484.8 mg, Cycle 2, Reason: Other (See Comments))         04/16/2018:   C1 weekly taxol 80 mg/m2 D1,8,15, Carboplatin AUC 6 D1 q 21 days. PCEs on thurs, txt on fridays. EXAMS ON EVEN CYCLES     CA 125: 523.4     Genetic testing: Neg for CS mutation, no VUS, lifetime remaining breast cancer risk -3.4%    05/07/2018:   C2 weekly taxol 80 mg/m2 D1,8,15, Carboplatin AUC 6 D1 q 21 days. PCEs on thurs, txt on fridays. EXAMS ON EVEN CYCLES     CA 125: 162.2    05/20/2018:   Early PCE due to scheduling, due 6/21    05/28/2018  Delay C3 due to low ANC     CA 125: 25    06/04/2018  C3 weekly taxol 80 mg/m2 D1,8,15, Carboplatin AUC 6 D1 q 21 days. EXAMS ON EVEN CYCLES.      CA 125    06/25/2018:   HOLD chemo due to low anc (1047)- defer to next week, and change to q 28 days cycle to build in an off week to allow for counts to recover     CA 125: 10.6    07/02/2018:   C4 weekly  taxol  80 mg/m2 D1,8,15, Carboplatin AUC 6 D1 q 28 days. EXAMS ON EVEN CYCLES.  Review her genetics testing results      CA 125 7.2    07/30/2018:  C5 weekly taxol 80 mg/m2 D1,8,15, Carboplatin AUC 6 D1 q 28 days. Neulasta Day 16      EXAMS ON EVEN CYCLES.      CA 125: 11.4    08/27/2018:   C6 weekly taxol 80 mg/m2 D1,8,15, Carboplatin AUC 6 D1 q 28 days. Neulasta Day 16. Order Ct CAP. RTC 10/11 (3 weeks)     EXAMS ON EVEN CYCLES. Discussed option of maintenance parp I- pt not decided (doesn't want to feel tired anymore)     CA 125: 8.7       05/28/2018 Tumor Markers    Tumor markers tested:  CA 125.  Results:   25.     06/25/2018 Tumor Markers    Tumor markers tested:  CA 125.  Results:   10.6.     07/02/2018 Tumor Markers    Tumor markers tested:  CA 125.  Results:   7.2.     07/07/2018 Hospital Admission    Admit date: 7/31 - 07/13/18  Admission diagnosis: Partial SBO  Additional comments: On 7/31 she experienced abdominal pain at home that was sharp on her LLQ and had significant diarrhea and one episode of emesis. She came to be evaluated at the [facility]. She has a history of prior small bowel obstruction. She was give IL NS, 2mg  morphine IV, and 4mg  Zofran IV in clinic and admitted for further observation. The abdominal XR showed dilated bowel loops, concerning for obstruction. CT abdomen/pelvis showed dilation of the small bowel with transition point seen in the anterior mid abdomen near the midline.     07/07/2018 Imaging    Imaging Completed:  X-ray of  abdomen  Result: Nonspecific bowel gas pattern. The presence of multiple air-fluid levels in small bowel raises question of early or partial small bowel obstruction.     07/07/2018 Imaging    CTAP:Small bowel obstruction with transition point in the anterior mid abdomen near the midline. Small amount of free fluid in the abdomen, which is nonspecific. 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  Peritoneum: Small amount of free fluid in the abdomen. A para-aortic density with central low attenuation measures 2.5 x 2 cm and is significantly decreased in size from prior.       07/30/2018 Tumor Markers    Tumor markers tested:  CA 125.  Results:   11.4.     08/27/2018 Tumor Markers    Tumor markers tested:  CA 125.  Results:   8.7.     09/20/2018 Procedure    Port removed: While the skin surface appeared inflamed, there was no evidence of infection in the port reservoir.       09/22/2018 Imaging    CT Chest-No evidence of thoracic metastatic disease.  Suspected small seroma in the base of the right neck.  Necrotic metastatic lesion could also have this appearance but considered less likely.    [name]- Interval decrease in size of a left periaortic necrotic lymph node. No evidence of new metastatic disease.     10/15/2018 Imaging    Imaging Completed:  PET scan of  whole body  Result: Resolution of the previous hypermetabolic para-aortic lymph nodes and hepatic dome lesions.  Hypermetabolic focus within the left thyroid lobe.  Consider further evaluation with ultrasound, if clinically indicated.       10/19/2018 Imaging    Imaging Completed:  Ultrasound of  neck  Result: 1.2 cm irregular hypoechoic nodule in the left thyroid lobe is suspicious. Recommend FNA for further evaluation.  1.0 cm nodule right thyroid lobe containing macrocalcification is indeterminate. Recommend follow-up thyroid ultrasound in one year to evaluate for stability.     10/21/2018 Tumor Markers    Tumor markers tested:  CA 125.  Results:   4.8.     11/12/2018 Tumor Markers    Tumor markers tested:  CA 125  Results:   5.5     11/12/2018 - 12/03/2018 Systemic Therapy (Oral and IV)    11/12/2018: C1 rubraca 600 mg po BID. RTC 2 weeks for labs (CBC, CMP, 4 weeks for PCE #2). Pt plans to go to [place] and may leave before PCE and re-est care at [facility] (Dr. Edison Nasuti). Referral to ENT placed (pt cancelled appt for 11/22) and FNA was prev ordered for  thyroid nodule- but has not been done.        03/02/2019 Imaging    CT Scan from [facility]    No evidence for recurrent or new neoplasm. Increased colonic stool could indicate constipation. Postoperative changes     06/13/2019 Tumor Markers    Tumor markers tested: CA 125 Results:   5.0.     09/15/2019 Tumor Markers    CA 125-6.1     05/31/2020 Tumor Markers    Ca 125-5.9     08/16/2020 Tumor Markers    CA 125-6.2     02/06/2021 Tumor Markers    CA 125-4.6     08/25/2022 Imaging    CT a/p: 2.6 cm lesion along the dome of the right hepatic lobe may represent a peritoneal lesion along the undersurface of the diaphragm rather than a true hepatic parenchymal lesion. 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     CT chest: No evidence of intrathoracic metastatic disease.      08/29/2022 Imaging    PET: 1.  Two FDG avid foci at the superior surface of right hepatic lobe, favored to be peritoneal nodules along the undersurface of right hemidiaphragm, concerning for metastases.      09/15/2022 Procedure    09/15/2022:   scheduled for Power Port Insertion with Dr. Redmond Pulling      09/16/2022 -  Systemic Therapy (Oral and IV)    09/16/2022:   C1 carboplatin AUC4, gem 1000 mg/m2 (D1/8), Bev D1 q 21 days  Treatment Summary   Treatment goal Disease control   Plan Name OP Gyn CARBOplatin AUC 4 / Gemcitabine / Bevacizumab   Status Active   Start Date 09/16/2022   End Date 03/12/2023 (Planned)   Provider Desmond Lope, MD   Chemotherapy bevacizumab (AVASTIN) 850 mg in sodium chloride 0.9 % 100 mL chemo infusion, 15 mg/kg = 850 mg, Intravenous, Once, 6 of 8 cycles  Administration: 850 mg (09/16/2022), 850 mg (10/16/2022), 850 mg (11/06/2022), 850 mg (11/27/2022), 850 mg (01/01/2023), 850 mg (01/29/2023)  gemcitabine (GEMZAR) 1,600 mg in sodium chloride 0.9 % 100 mL chemo infusion, 1,000 mg/m2 = 1,600 mg, Intravenous, Once, 6 of 6  cycles  Administration: 1,600 mg (09/16/2022), 1,600 mg (09/23/2022), 1,600 mg (10/16/2022), 1,600 mg (10/23/2022), 1,600 mg (11/06/2022), 1,600 mg (11/13/2022), 1,600 mg (11/27/2022), 1,600 mg (12/04/2022), 1,600 mg (01/01/2023), 1,600 mg (01/08/2023), 1,600 mg (01/29/2023),  1,600 mg (02/05/2023)            09/16/2022 Tumor Markers    Tumor markers tested:  CA 125  Results:   7.5     10/07/2022 Tumor Markers    CA125: 7.9     10/16/2022 Tumor Markers    CA 125: 7.9     11/06/2022 Tumor Markers    CA 125: 11.1     11/17/2022 Imaging    Imaging Completed:  CT Scan of  chest, abdomen, and pelvis  AP: Interval decrease in size of the peritoneal nodule in the dome of the right hepatic lobe. No evidence of new metastatic disease.   CHEST: No evidence of intrathoracic metastatic disease.      11/27/2022 Tumor Markers    CA 125: 11.4     12/18/2022 Tumor Markers    CA 125: 9.8     01/01/2023 Tumor Markers    CA 125: 8.2     01/22/2023 Tumor Markers    CA 125: 8     01/29/2023 Tumor Markers    CA 125: 7.6     01/30/2023 Imaging    Imaging Completed:  CT Scan of  chest, abdomen, and pelvis  Result: CHEST: No evidence of intrathoracic metastatic disease.   [name]: No visible metastatic disease.        Past Medical History  She  has a past medical history of Bowel obstruction (CMS-HCC) (2018), Ovarian cancer (CMS-HCC) (2014), and Thyroid disease.  Past Medical History:   Diagnosis Date    Bowel obstruction (CMS-HCC) 2018    Ovarian cancer (CMS-HCC) 2014    Thyroid disease      Past Surgical History  Past Surgical History:   Procedure Laterality Date    ABDOMINOPLASTY  1999    BLEPHAROPLASTY  2000    DILATION AND CURETTAGE OF UTERUS  1979    ELBOW SURGERY  1990    EYE SURGERY  1994    INSERTION CATHETER VASCULAR ACCESS N/A 09/15/2022    Procedure: INSERTION POWER PORT A CATH;  Surgeon: Garrel Ridgel, MD;  Location: [facility] OR;  Service: General;  Laterality: N/A;  Left Subclavian    KNEE ARTHROPLASTY Right     ROTATOR CUFF REPAIR       TRANSUMBILICAL AUGMENTATION MAMMAPLASTY      VAGINAL HYSTERECTOMY  1979     Family History  History reviewed. No pertinent family history.    Family cancer history for ovarian, uterine and colon cancer is negative other than above.     Social History  Social History     Socioeconomic History    Marital status: Married     Spouse name: None    Number of children: None    Years of education: None    Highest education level: None   Occupational History    None   Tobacco Use    Smoking status: Some Days     Packs/day: 0     Types: Cigarettes    Smokeless tobacco: Never    Tobacco comments:     Smokes 1 pack every three days    Vaping Use    Vaping Use: Never used   Substance and Sexual Activity    Alcohol use: Yes     Alcohol/week: 14.0 standard drinks of alcohol     Types: 14 Shots of liquor per week    Drug use: Yes    Sexual activity: Not Currently   Other Topics Concern    Caffeine  Use Yes    Occupational Exposure No    Exercise Yes    Seat Belt Yes   Social History Narrative    Mammogram-6 years ago and it was normal. Pt stated she isn't getting them anymore.      Colonoscopy-10 years ago        Pt reports she can lie flat in bed without SOB.    Pt states she can walk a flight of stairs and city block without chest pain and SOB>       Social Determinants of Health     Financial Resource Strain: Not on file   Food Insecurity: No Food Insecurity (01/15/2023)    Yearly Questionnaire     Do you need any assistance with obtaining housing, meals, medication, transportation or medical equipment?: No     Assistance needed for:: Not on file   Transportation Needs: No Transportation Needs (01/15/2023)    Yearly Questionnaire     Do you need any assistance with obtaining housing, meals, medication, transportation or medical equipment?: No     Assistance needed for:: Not on file   Physical Activity: Not on file   Stress: Not on file   Social Connections: Not on file   Intimate Partner Violence: Not At Risk (11/21/2022)     Humiliation, Afraid, Rape, and Kick questionnaire     Fear of Current or Ex-Partner: No     Emotionally Abused: No     Physically Abused: No     Sexually Abused: No   Housing Stability: Low Risk  (01/15/2023)    Yearly Questionnaire     Do you need any assistance with obtaining housing, meals, medication, transportation or medical equipment?: No     Assistance needed for:: Not on file     Past OB/GYN History  G3P2  She reports menarche at age 18 and menopause at age 38-surgical.  She denies a history of STIs.  She denies a history of abnormal cervical cytology and reports her last cytologic examination she is unsure of. She has taken HRT.     Health maintenance:  Mammogram: Date 6 years ago Results normal  Colonoscopy: Date 10 years ago Results normal    Allergies  She is allergic to monosodium glutamate, nsaids (non-steroidal anti-inflammatory drug), sulfamethoxazole-trimethoprim, celecoxib, and metoclopramide.    BMI  Body mass index is 21.87 kg/m.                   Histories  She has a past medical history of Bowel obstruction (CMS-HCC) (2018), Ovarian cancer (CMS-HCC) (2014), and Thyroid disease.    She has a past surgical history that includes Dilation and curettage of uterus (1979); Vaginal hysterectomy (1979); Elbow surgery (1990); Eye surgery (1994); Abdominoplasty (1999); Blepharoplasty (2000); Transumbilical augmentation mammaplasty; Rotator cuff repair; Knee Arthroplasty (Right); and insertion catheter vascular access (N/A, 09/15/2022).    Her family history is not on file.    She reports that she has been smoking cigarettes. She has never used smokeless tobacco. She reports current alcohol use of about 14.0 standard drinks of alcohol per week. She reports current drug use.    Allergies  Monosodium glutamate, Nsaids (non-steroidal anti-inflammatory drug), Sulfamethoxazole-trimethoprim, Celecoxib, and Metoclopramide    Medications  Outpatient Encounter Medications as of 02/19/2023   Medication Sig Dispense  Refill    apixaban (ELIQUIS) 5 mg Tab Take 1 tablet (5 mg total) by mouth 2 times a day. 180 tablet 1    ascorbic acid, vitamin C, (VITAMIN C)  1000 MG tablet Take 1 tablet (1,000 mg total) by mouth daily.      cetirizine 10 mg Cap Take 10 mg by mouth daily.      cholecalciferol, vitamin D3, (VITAMIN D3) 1000 units tablet Take 1 tablet (1,000 Units total) by mouth daily. 30 tablet 0    dexAMETHasone (DECADRON) 4 MG tablet Take 8 mg by mouth daily with food on day 2, 3, and 4 of chemotherapy cycle 18 tablet 4    GLUTATHIONE-L ORAL Take 1 teaspoonful by mouth daily.      IODINE ORAL Take 50 mg/day by mouth.      Lactobac no.41/Bifidobact no.7 (PROBIOTIC-10 ORAL) Take 1 capsule by mouth daily. Garden of [employer] brand      levothyroxine (SYNTHROID) 75 MCG tablet Take 1 tablet (75 mcg total) by mouth every morning before breakfast.      lisinopriL (PRINIVIL) 10 MG tablet Take 2 tablets (20 mg total) by mouth daily. 60 tablet 3    omeprazole (PRILOSEC) 20 MG capsule Take 1 capsule (20 mg total) by mouth in the morning and at bedtime.      sodium chloride (OCEAN) 0.65 % nasal spray Use 1 spray into each nostril if needed. 44 mL 0    UNABLE TO FIND Take 30 drops by mouth daily. Multimineral      UNABLE TO FIND Take 20 drops by mouth daily. Med Name: Lurena Joiner Drops      UNABLE TO FIND Take 2 capsules by mouth daily. Med Name: Body Bio      vit b complex w-b 12 tablet Take 1 tablet by mouth daily.      ZINC CITRATE ORAL Take 1 tablet by mouth daily.       Facility-Administered Encounter Medications as of 02/19/2023   Medication Dose Route Frequency Provider Last Rate Last Admin    heparin lock flush 100 unit/mL syringe 500 Units  500 Units Intravenous Daily PRN Mikle Bosworth, MD        sodium chloride 0.9% flush 10 mL  10 mL Intravenous Daily PRN Mikle Bosworth, MD   10 mL at 02/19/23 0816      Review of Systems  see HPI    Vitals  Blood pressure 149/81, pulse 82, temperature 97.4 F (36.3 C), temperature  source Oral, resp. rate 15, height 5' 4 (1.626 m), weight 127 lb 6.4 oz (57.8 kg), SpO2 95%.    Physical Exam   Vitals reviewed.  Constitutional: She is oriented to person, place, and time. She appears well-developed.   HENT:   Head: Atraumatic.   Neck: No thyromegaly present.   Cardiovascular: Normal rate.   Pulmonary/Chest: Effort normal.   Abdominal: Normal appearance. She exhibits no distension.   Genitourinary:    Genitourinary Comments: def     Musculoskeletal:         General: Normal range of motion.      Cervical back: Normal range of motion.   Neurological: She is alert and oriented to person, place, and time.   Skin: Skin is warm and dry.   Psychiatric: Her behavior is normal.        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     Posterior cul de sac biopsy-metastatic adenocarcinoma.     Omentectomy- metastatic adenocarcinoma     LND-negative     Residual right IP ligament, Anterior cul de sac biopsy, right pelvic side will biopsy, left pelvic side wall, right gutter and left gutter-->benign    04/06/2017:   CT [name]- mild small bowel obstruction with transition point in the left pelvis adjacent to the anterior abdominal wall. Low density in the common femoral  veins bilaterally thought likely to represent flow artificat, may consider follow up with lomer extremity Doppler to ensure there is no underlying deep venous thrombosis.    01/29/2018:   CTAP:  Interval development of necrotic retroperitoneal lymphadenopathy with largest node measuring 4.9  4.2 x 4.8 cm. Origin is indeterminate.  Options would include CT directed biopsy as well as PET/CT.    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  No other focal areas of FD avid malignancy are found. Specifically, no lymph node activity is found outside of the retroperitoneum and there is no evidence for FDG avid pulmonary, skeletal or hepatic parenchymal metastasis.    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 No retroperitoneal, intra-abdominal, pelvic or inguinal adenopathy, no evidence for peritoneal surface neoplastic involvement. No evidence for recurrent or new neoplasm    Cancer Staging:   Cancer Staging   Malignant neoplasm of ovary (CMS-HCC)  Staging form: Ovary, Fallopian Tube, And Primary Peritoneal Carcinoma, AJCC 8th Edition  - Clinical: FIGO Stage IIIC - Signed by Mikle Bosworth, MD on 04/01/2018                      Assessment & Plan  My impression is Ms. 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 She completed 6 cycles of chemotherapy with Carboplatin and Paclitaxel-weekly (C6D1 was 08/27/18). CA 125 was 8.7 at C6.     She underwent CT CAP on 09/22/2018 revealed ned in chest and interval decrease in size of a left periaortic necrotic lymph node from 1.8 to 1.6 cm. No evidence of new metastatic disease.There was no comment on the  PET + liver peritoneal implant noted on PET in 03/04/18.     PET on 10/15/18 shows reassuring intraabdominal findings with resolution of the previous hypermetabolic para-aortic lymph nodes and hepatic dome lesions, but did note uptake within the thyroid. She underwent thyroid US on 10/20/18 with a 1.2 cm irregular hypoechoic nodule in the left thyroid lobe is suspicious. Recommend FNA for further evaluation.     She initiated maintenance rubraca 11/2018 but ultimately elected to stop in 02/2019 due to side effects.   Patient had reestarted drug at 500 mg po BID once she got down to [place]-. Was in [place] from 12/2018-05/2019. Reports her oncologist down in [place] had changed doses of Rubraca a few times, to 300mg  po BID but decided to stop drug all together back in 02/2019 2/2 to decreased QOL. She does not wish to restart drug at this time. Last CT scan 03/02/2019 in [place], no evidence of recurrent or new neoplasm.     She was seen for surveillance 08/21/2022 with an increase in her CA 125 to 8.5 - CT CAP and PET were checked and consistent with platinum sensitive recurrence #2.     At this time, she has decided to proceed with chemotherapy.    She underwent repeat CTs following C3 on 11/17/22 which showed partial response with interval decrease in size of the peritoneal nodule in the dome of the right hepatic lobe NED in the chest.     Now s/p C6 of Carboplatin/Gemcitabine/bevacizumab (01/29/2023). CT imaging on 01/30/23 showed NED.    01/30/2023:   CTAP: No visible metastatic  disease. Peritoneum/Retroperitoneum: No free fluid, focal fluid collections or free air. No nodularity or mass lesions. The previously noted subtle hypodensity in the right subphrenic region is not visible.   CTC: NED    She is in remission once again with noramal CA 125 and negative Ct CAP after 6 cycles of carbo gem bev. She is planed to undergo maintenance bev (this is my recommendation)- and she is moving to [place] and has established care at [facility]. We were planning to give her first cycle bev today 3.14.24, but her plts are inn the 40s. I offered she can come back next week and get treatment if her counts or better- or she can go to [place] as scheduled this weekend, and give her body a few weeks of recovery and start bev once she is there if her gyn onc agrees. She prefers to the latter option. I wish her well- and am happy to see her back if she moves back to [place].    02/19/2023:  HOLD chemo- due to thrombocytopenia (plts 48)- pt moving to [place] this weekend- will start maintenance bev q 21 days once she gets there (moving to [place])    . [facility]; Dr. Johny Drilling Fax [phone]) [place], [place]; Dr. Christella Hartigan Fax [phone])     __ CA 125 stable and normal   __ Held C1 maint bev 2/2 thrombocytopenia and nose bleeds  __ENT referral placed for nose bleeds (likely bev related)  __Neulasta added to D9 C2  __Lisinopril added for HTN 10/07/2022  __new LUE/neck DVT 12/2021- on eliquis- see below    VTE:  -Patient was admitted from 11/12-11/13/23 to the hospital for extensive LUE/shoulder/neck DVT and was started on a heparin gtt and discharged home on eliquis. Per vascular surgery, there were no acute interventions needed besides anticoagulation. She was on eliquis 10 mg BID for 7 days (11/13-11/19) followed by eliquis 5mg  BID for  6 months. Per surgical oncology, she only needs repeat imaging if the swelling gets worse while she is on [facility] or if the port becomes non-functional. Clot burden on exam is  improved  - had two weeks of multiple nose bleeds on the eliquis but stopped taking the herbs that might have been interacting with her eliquis and that stopped her nose bleeds   __She is going to continue on her bev  __Referred to ENT now, no current nose bleed    Patient was admitted 11/21/22-11/24/22 for management of anemia and leukocytosis. She required plt transfusion during admission. Patient was discharged with instructions to restart eliquis 2.5mg  BID (prophylactic dosing) until repeat CBC with Plt > 50, at which time she can resume therapeutic dosing of eliquis 5mg  BID     Seen 12/18/22, compliant with eliquis 2.5mg  BID. CBC today Plt > 50, will resume therapeutic dosing of eliquis 5mg  BID   __ patient informed and eliquis 5mg  BID refilled 12/18/22    Thyroid Nodule:   Incidental finding on PET scan of hypermetabolic focus within left thyroid nodule. Ultrasound performed on 10/20/18 with 1.2 cm irregular hypoechoic nodule, appearing suspicious. Recommend FNA for further evaluation, which was ordered. Pt has not followed up to date.  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 Referral to ENT made, but patient did not see them. She followed up with her PCP and was started on Synthroid. Last TSH 5.91. Repeat TSH/T4/T3 labs wnl 11/2020     Genetics:  previously discussed her negative genetic testing results.    Pain    Pain Score: Zero (02/19/2023  8:35 AM)    She denies presence of pain.  Pain will be reassessed at next visit.           The HPI, ROS, physical exam, test results, and assessment & plan were reviewed and copied forward (with edits) from a note written by Dr. [name] on 01/30/23. I have reviewed and updated the history, physical exam, data, assessment, and plan of the note so that it reflects my evaluation and management of the patient.    Georgeanna Lea, MD PGY-2  OB-GYN Resident      I saw and personally examined the patient today with my resident. I discussed the findings  and therapeutic plan with the patient. I repeated, reviewed and agree with the history of present illness, past medical histories, family history, social history, medication list, and allergies as listed. The review of systems is as noted above. My physical exam confirms the findings listed above. Review of labs, pathology reports, radiograph reports, and medical records confirm the findings noted above. I agree with the assessment and plan as noted above. I have edited the note where appropriate.     I have personally performed a face to face diagnostic evaluation on this patient, seen initially by the resident. I have personally developed the care plan as outlined in the Assessment and Plan.     This note was completely edited, written and reviewed by me and consists of information cut and pasted from the my most recent visit, my smart phrases and other Epic tools. I have personally reviewed all aspects of this note to at least include reviewing this patient's chart and problem list, updating the history, physical exam, lab and procedure results, and assessment and plan as detailed above and below. As such this visit note reflects my current evaluation and management for this patient.    This note was copied forward  from the note written by me on 01/30/23. I have reviewed and updated the history, physical exam, data, assessment and plan of the note so that it reflects the evaluation and management of the patient on 02/19/23.        Complexity: high     I spent a total of 45 minutes was spent prior to, during and after the patient encounter in counseling and/or coordination of care, documentation and counseling with patient and/or family.      Topics discussed include rec ovarian cancer  Cancer prognosis and treatment options and management of chemo.      01/30/2023:   CTAP: No visible metastatic disease. Peritoneum/Retroperitoneum: No free fluid, focal fluid collections or free air. No nodularity or mass lesions.  The previously noted subtle hypodensity in the right subphrenic region is not visible.   CTC: NED    02/19/2023:   HOLD chemo- due to thrombocytopenia (plts 48)- pt moving to [place]- will start maintenance bev q 21 days once she gets there (moving to [place])        Maggie Font, MD, FACOG  Associate Professor, Obstetrics and Gynecology  Division of Gynecologic Oncology    Medical Decision Making:  The following items were considered in medical decision making:  Review / order clinical lab tests  Review / order radiology tests  Review / order other diagnostic tests/interventions

## 2023-02-19 NOTE — Progress Notes (Signed)
Patient arrived for port access, office visit and scheduled treatment today. Allergies reviewed with patient. Port accessed per protocol without difficulty, positive blood return noted. Epic lab draw + UA as ordered. See flow sheet for port details.     02/19/23 0816   Specimen Collection Status   Specimen Collection Unit   Port A Cath Left Chest   No placement date or time found.   Orientation: Left  Location: Chest   Line Intervention Accessed   Site Assessment No problems identified   Line Status Blood return noted   Needle Size 20 gauge, 0.75 huber   Dressing Type Transparent   Dressing Status Clean;Dry;Intact   Dressing Intervention New dressing   Accessed/Deaccessed by: R. Winona Legato RN   Access Attempts 1   Reaccess Due Date 02/26/23   Flush Performed Yes   Date to be Reflushed 02/20/23   Dressing Change Due Date 02/26/23

## 2023-02-19 NOTE — Addendum Note (Signed)
Addended by: Hollie Beach on: 02/19/2023 09:53 AM     Modules accepted: Orders

## 2023-02-23 ENCOUNTER — Ambulatory Visit: Payer: Medicare (Managed Care)

## 2023-03-02 ENCOUNTER — Ambulatory Visit: Payer: Medicare (Managed Care)

## 2023-03-09 ENCOUNTER — Ambulatory Visit: Payer: Medicare (Managed Care)

## 2023-03-11 NOTE — Telephone Encounter (Signed)
Patient reports receiving treatment in [place] 1 week ago.  Advised she has been experiencing undesirable side effects.  This RN recommended follow up with treating provider and/or pharmacist as she is not receiving treating here at [facility].  Patient verbalized understanding.

## 2023-03-16 ENCOUNTER — Ambulatory Visit: Payer: Medicare (Managed Care)

## 2023-03-23 ENCOUNTER — Ambulatory Visit: Payer: Medicare (Managed Care)

## 2023-05-19 IMAGING — CT CT CHEST/ABDOMEN/PELVIS WITH CONTRAST
3 of 8 series · 16 of 46 positions shown, 18 images · IV contrast (ISOVUE 300)
Comparison: None.

________________________________________________________________________________________________ 
CT CHEST/ABDOMEN/PELVIS WITH CONTRAST, 05/19/2023 [DATE]: 
CLINICAL INDICATION: Malignant neoplasm of the fallopian tube. 
A search for DICOM formatted images was conducted for prior CT imaging studies 
completed at a non-affiliated media free facility.
TECHNIQUE: The chest, abdomen and pelvis were scanned from base of neck through 
the pubic rami 75 mL of Isovue 300 MDV injected intravenously on a high 
resolution low dose CT scanner. Routine MPR and MIP reconstruction images were 
performed. The patients eGFR was calculated to be 80.3 mL/min/1.73 m2 using the 
i-STAT device.

[Series 4: chest 2.0 i31s 3 · axial · 0.71mm/px · z∈[-322,-192]mm · 4 of 168 slices shown]
[im 13/168  soft-tissue]
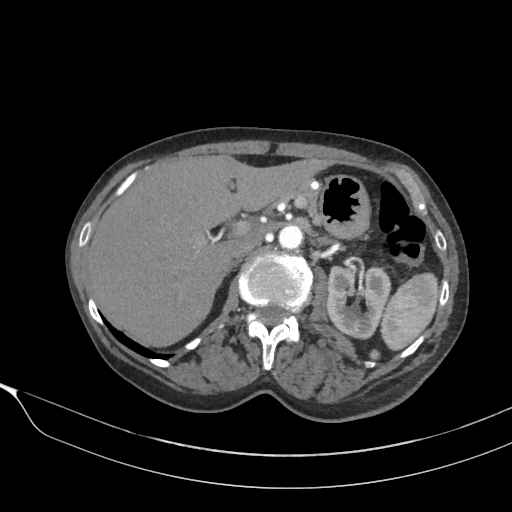
[im 39/168  soft-tissue]
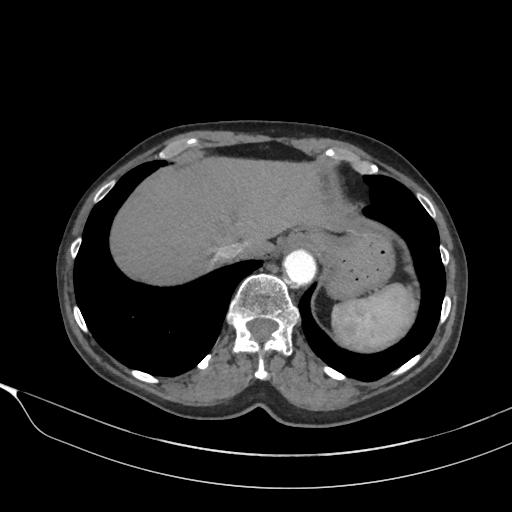
[im 52/168  soft-tissue]
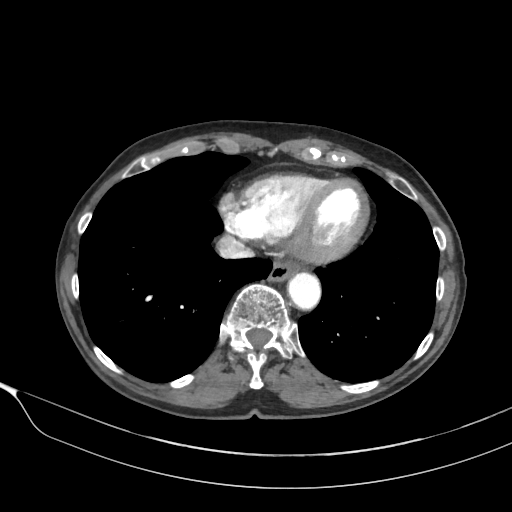
[im 78/168  soft-tissue]
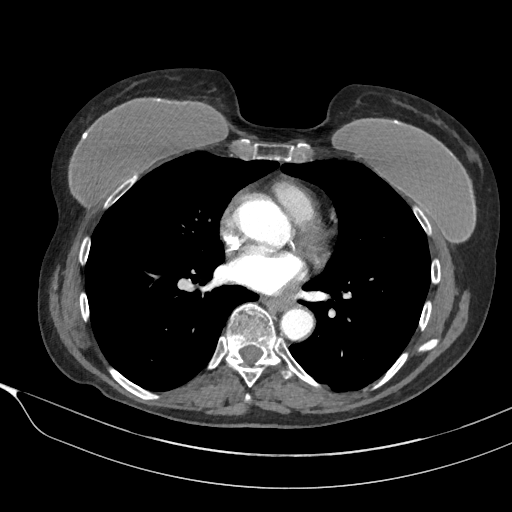

[Series 6: coronal · coronal · 0.67mm/px · 3 of 114 slices shown]
[im 29/114  soft-tissue]
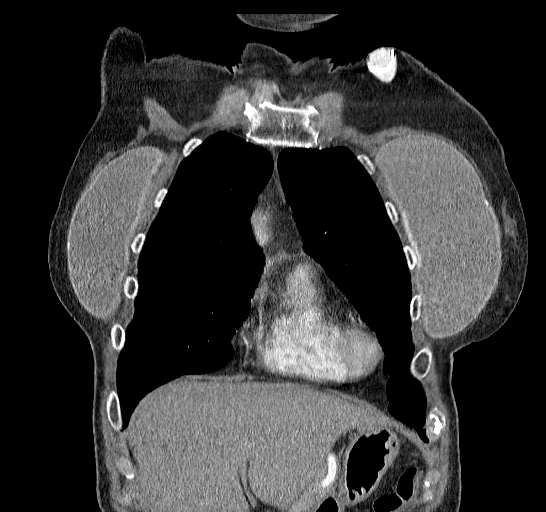
[im 57/114  soft-tissue]
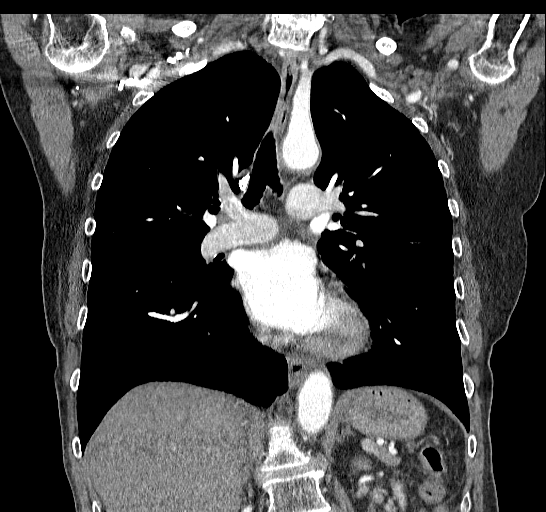
[im 85/114  soft-tissue]
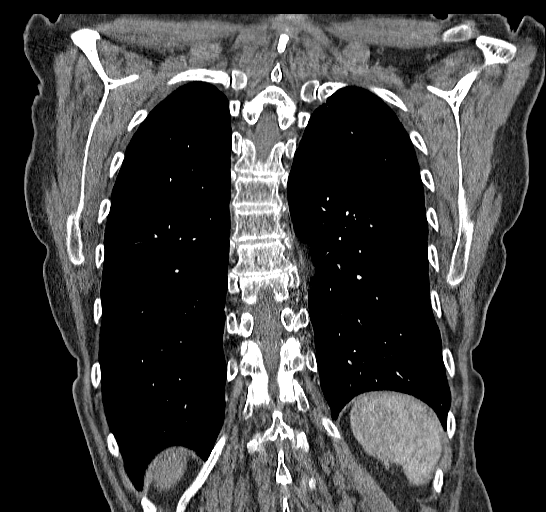

[Series 9: abd/pel with 3.0 i41s 2 · axial · 0.68mm/px · z∈[-569,-257]mm · 9 of 130 slices shown, 11 images]
[im 13/130  soft-tissue]
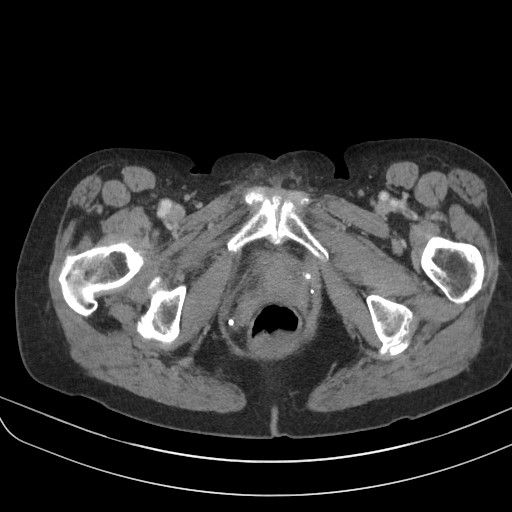
[im 13/130  bone]
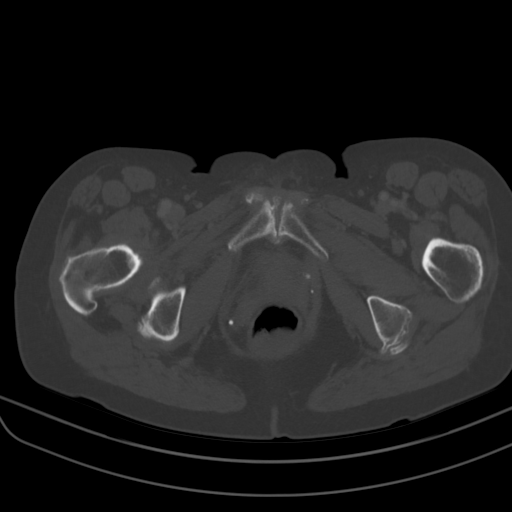
[im 26/130  soft-tissue]
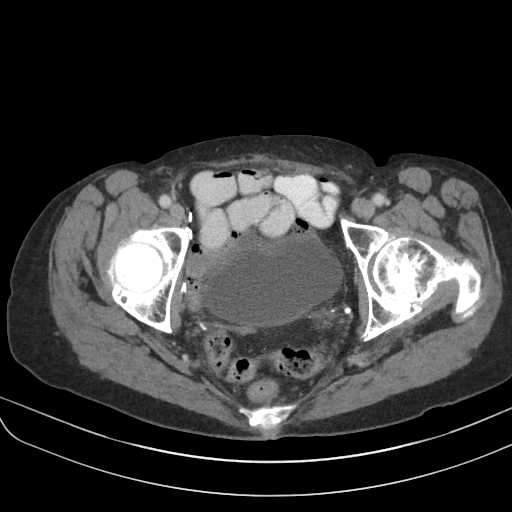
[im 39/130  soft-tissue]
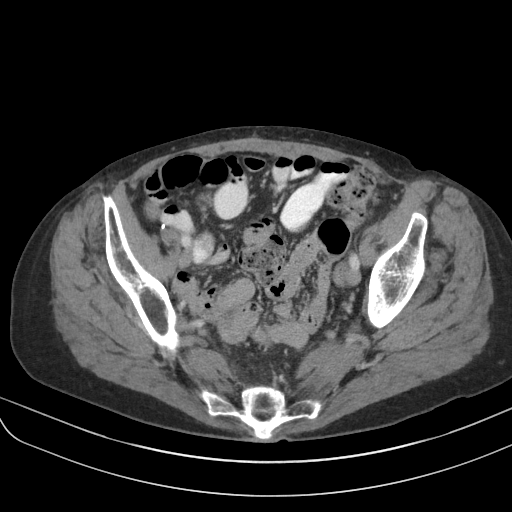
[im 52/130  soft-tissue]
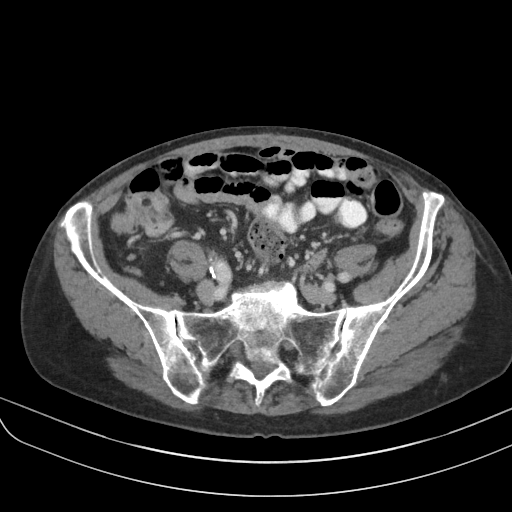
[im 65/130  soft-tissue]
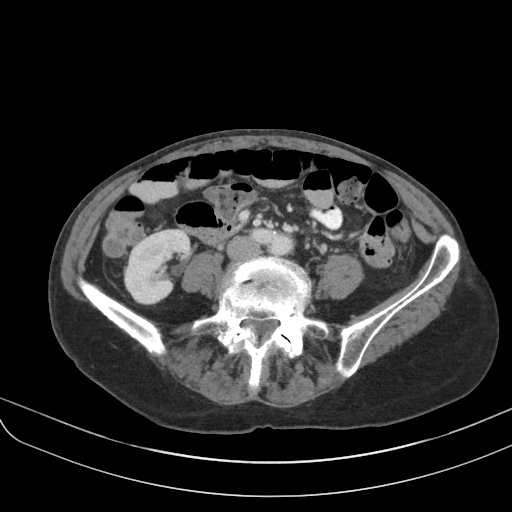
[im 78/130  soft-tissue]
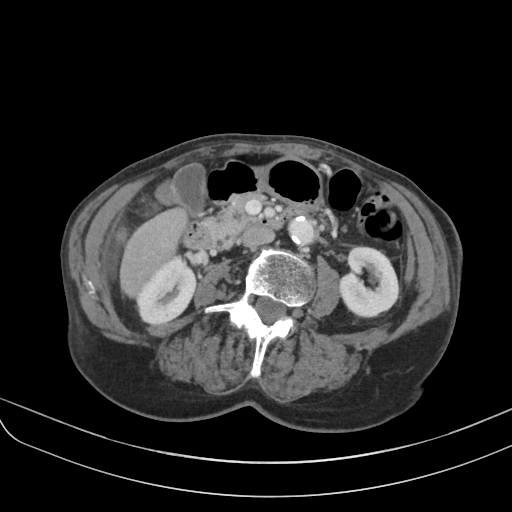
[im 91/130  soft-tissue]
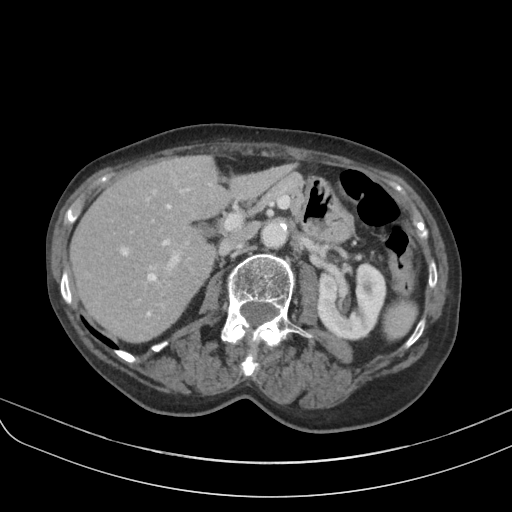
[im 104/130  soft-tissue]
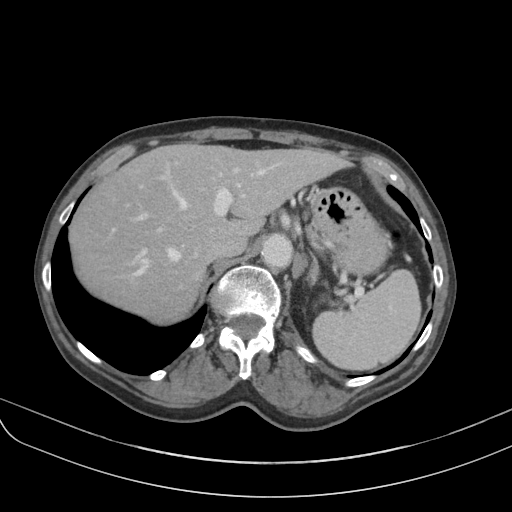
[im 117/130  soft-tissue]
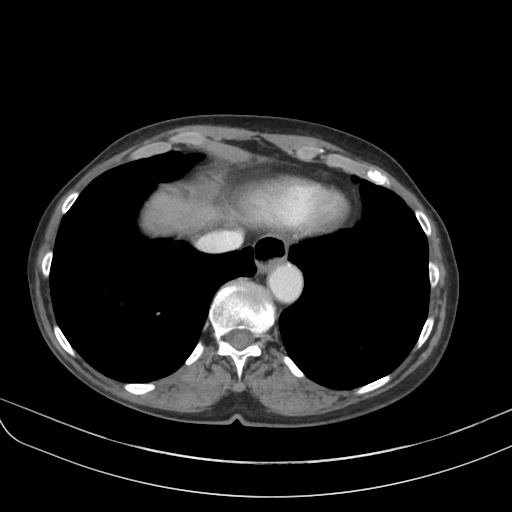
[im 117/130  bone]
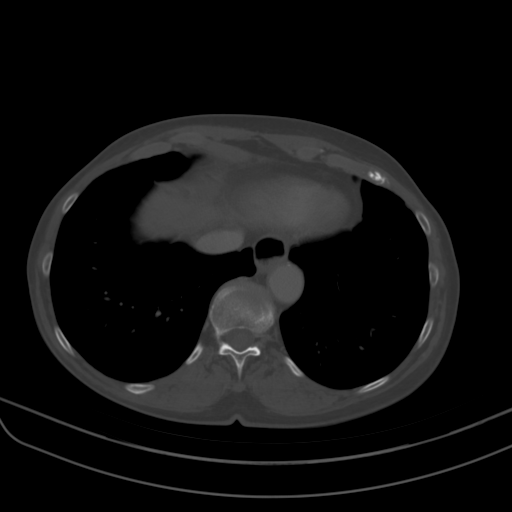

[16 of 46 positions shown; findings below may reference images not displayed]

Count of known CT and Cardiac Nuclear Medicine studies performed in the previous 
12 months = 0.
FINDINGS: LUNGS AND PLEURA:  Calcified granuloma. No suspicious pulmonary nodule.. No 
pleural effusion.  
MEDIASTINUM:  No adenopathy. Normal heart size. No pericardial effusion. No 
coronary artery calcifications noted. Bilateral breast implants. Small hiatal 
hernia 
CHEST WALL/AXILLA: No mass or adenopathy.  
HEPATOBILIARY: No evidence for mass or biliary dilatation. No gallstones. Patent 
portal vein. 
SPLEEN: Normal in size. 
PANCREAS: No ductal dilatation or mass.   
ADRENALS: No mass. 
GENITOURINARY: No evidence for enhancing mass, stones or hydronephrosis. No 
bladder mass. Bladder is decompressed. Status post hysterectomy. 
LYMPH NODES: Surgical clips right iliac region. No new adenopathy. 
STOMACH, SMALL BOWEL AND COLON: No bowel wall thickening or obstruction. 
Appendix is of normal caliber. Scattered air-fluid levels within the small bowel 
with some fecalization of the distal small bowel contents suggesting prolonged 
transit time. Moderate amount of stool within the colon. No pericolonic 
inflammatory process or collection. 
VASCULAR STRUCTURES: Ectatic aorta. No aneurysm.  
MUSCULOSKELETAL: No acute osseous abnormality. Scattered degenerative changes.  
ADDITIONAL FINDINGS: None.
IMPRESSION: Previous granulomatous disease. No suspicious pulmonary nodule. 
No thoracic, abdominal or pelvic adenopathy.  
In patients between the ages of 50-77 where pulmonary emphysema is noted on CT, 
recommend evaluation for low dose lung cancer screening protocol if patient is 
not already enrolled; as pulmonary emphysema is an independent risk factor for 
lung cancer. 
RADIATION DOSE REDUCTION: All CT scans are performed using radiation dose 
reduction techniques, when applicable.  Technical factors are evaluated and 
adjusted to ensure appropriate moderation of exposure.  Automated dose 
management technology is applied to adjust the radiation doses to minimize 
exposure while achieving diagnostic quality images.

## 2023-09-01 NOTE — Telephone Encounter (Signed)
Patient called in wanting to speak with one of Dr Billingsley's nurses.  She said that she has finished her chemo in [place] and has started Avastin.  She would like to know what brand they used because the different types they are using in [place] she keeps having reactions to.  She would like a call back to discuss at [phone].

## 2023-09-03 NOTE — Telephone Encounter (Signed)
Patient called in wanting to speak to one of Dr Billingsley's nurses.  She said that she needs to know the manufacturer of the Avastin that she is on and the mg because she has had reactions to the drug in [place].  I explained that all of her medications should be in her My Chart.  I told her she could ask her current oncologist and even contact medical records.  She said just have someone from Dr Billingsley's office call me back.  I told her I would send a message.

## 2023-09-03 NOTE — Telephone Encounter (Signed)
RTC to patient. No answer. LVM letting patient know Avastin from [employer] is used if further information needed she needs to contact medical records.  Call back number provided.

## 2023-09-07 IMAGING — CT CT BRAIN W/WO CONTRAST
3 of 6 series · 14 of 47 positions shown, 17 images · IV contrast (isovue)
Comparison: PET CT exam of 10/15/2018.

________________________________________________________________________________________________ 
CT BRAIN W/WO CONTRAST, 09/07/2023 [DATE]: 
CLINICAL INDICATION: Diplopia. Blurred vision. Severe pressure bilateral ears. 
Off balance x3 weeks. Patient left country to [place] on 08/11/2023 and 
experienced symptoms a few days into trip. History of ovarian cancer. Current 
chemotherapy. 

A search for DICOM formatted images was conducted for prior CT imaging studies 
completed at a non-affiliated media free facility.
TECHNIQUE: The head was scanned from vertex through skull base without and with 
50 mL of Isovue 300 MDV injected intravenously on a high resolution CT scanner 
using dose reduction techniques. Routine MPR reconstructions were performed. The 
patients eGFR was calculated to be 59.9 mL/min/1.73 m2 using the i-STAT device.

[Series 2: head w/o 3.0 j30s 1 · axial · non-contrast · 0.41mm/px · z∈[-219,-90]mm · 9 of 51 slices shown, 12 images]
[im 4/51  brain]
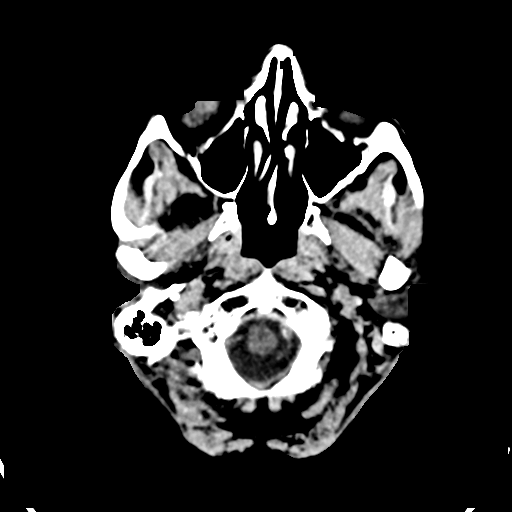
[im 4/51  bone]
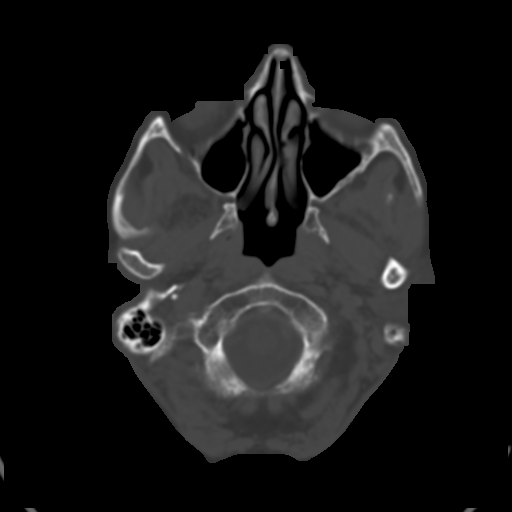
[im 11/51  brain]
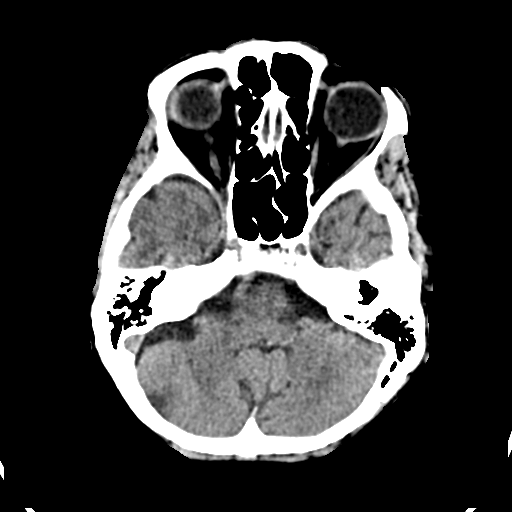
[im 15/51  brain]
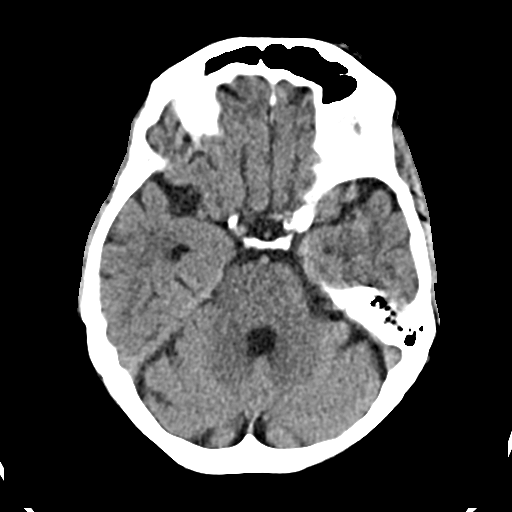
[im 22/51  brain]
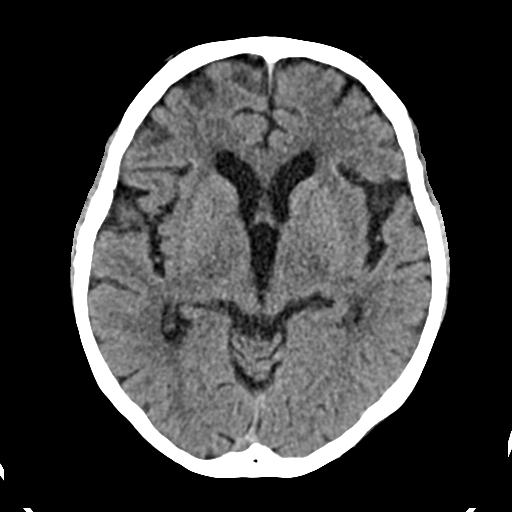
[im 26/51  brain]
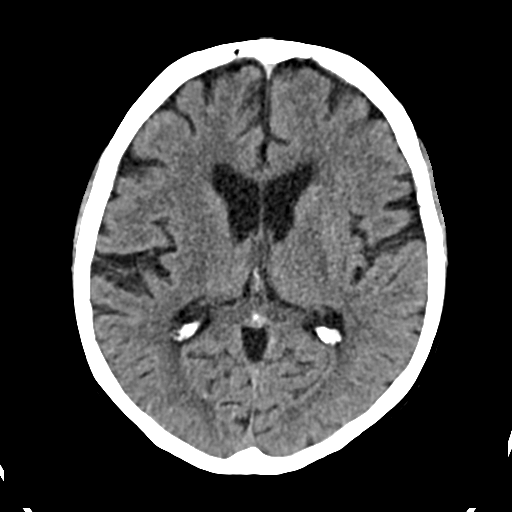
[im 26/51  bone]
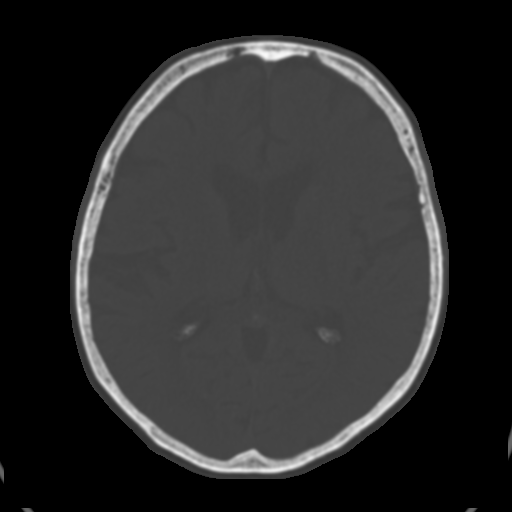
[im 29/51  brain]
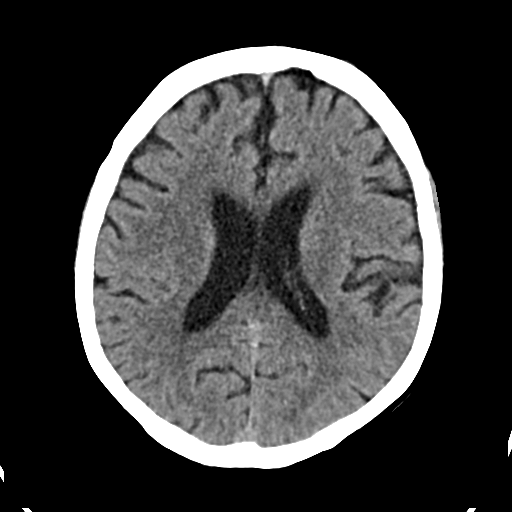
[im 36/51  brain]
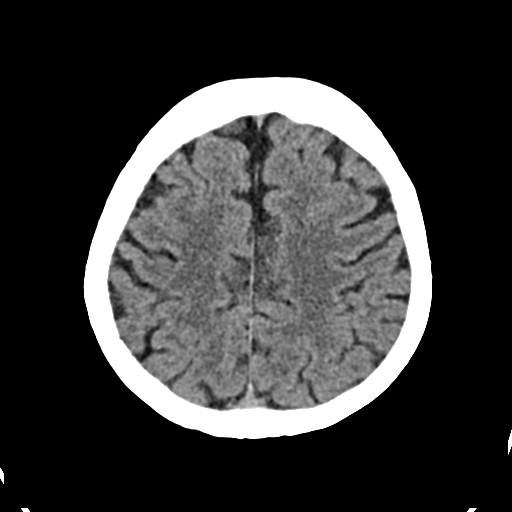
[im 40/51  brain]
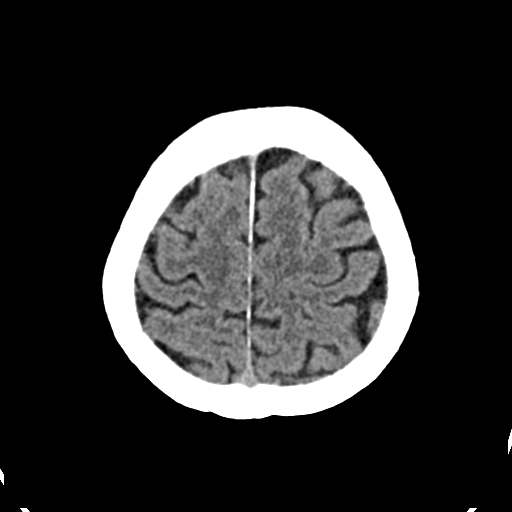
[im 47/51  brain]
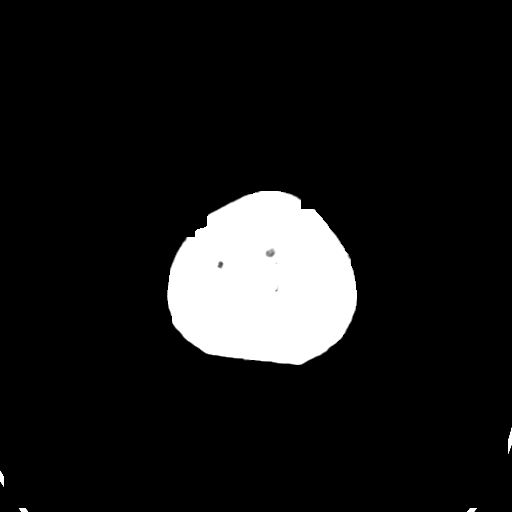
[im 47/51  bone]
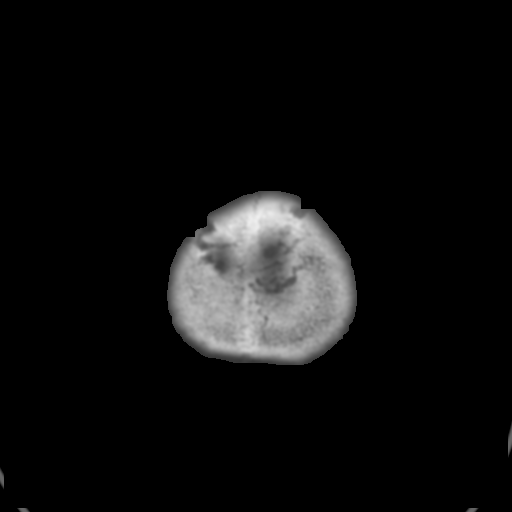

[Series 3: coronal · coronal · 0.30mm/px · 3 of 96 slices shown]
[im 24/96  brain]
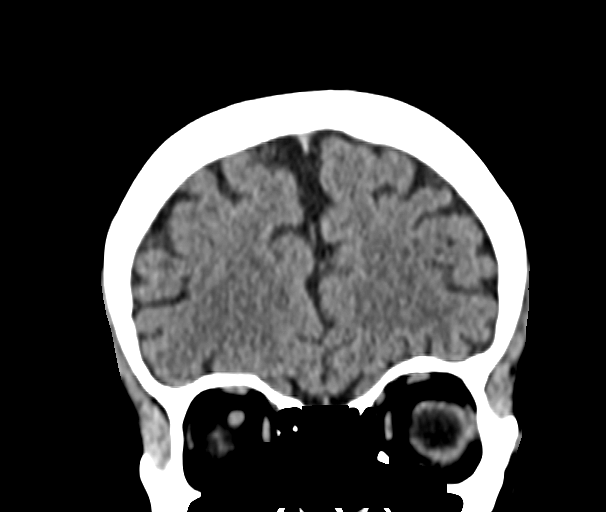
[im 48/96  brain]
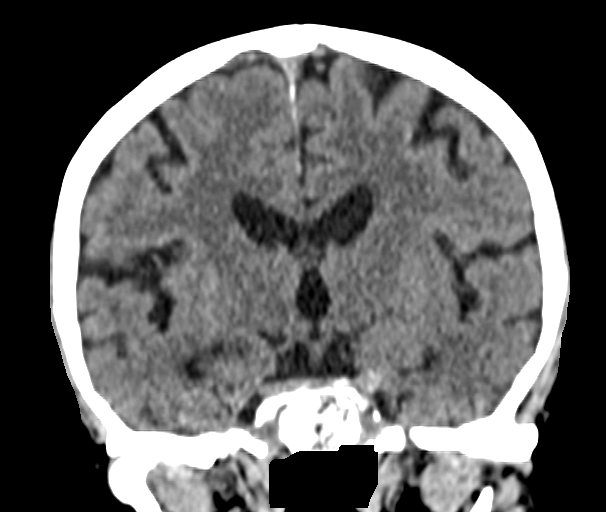
[im 72/96  brain]
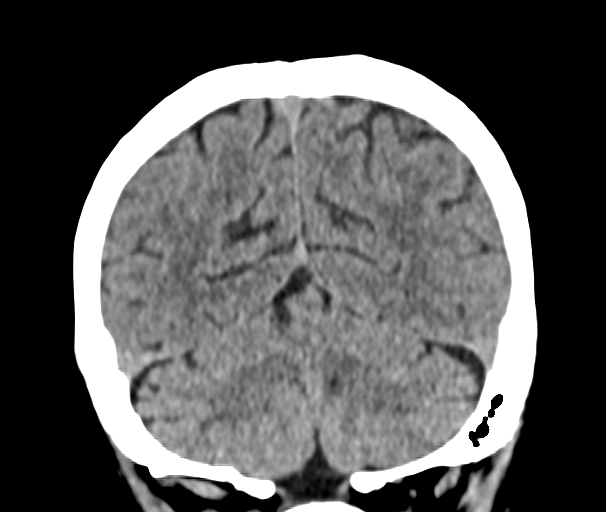

[Series 8: sag sag · sagittal · 0.31mm/px · 2 of 57 slices shown]
[im 19/57  brain]
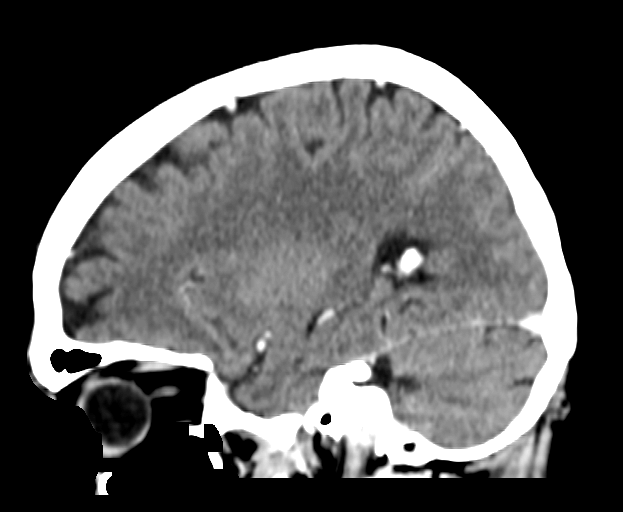
[im 38/57  brain]
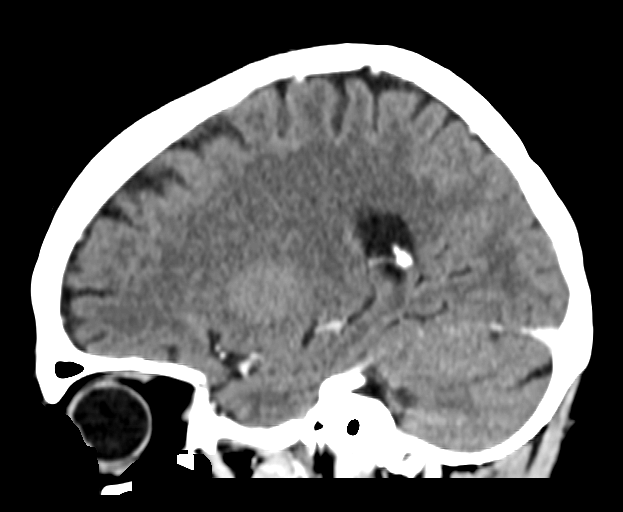

[14 of 47 positions shown; findings below may reference images not displayed]

Count of known CT and Cardiac Nuclear Medicine studies performed in the previous 
12 months = 2.
FINDINGS: --------------------------------------------------------------------      
INTRACRANIAL: 
No acute intracranial hemorrhage, mass effect, or midline shift. No CT evidence 
of large acute territorial ischemia. No pathologic enhancement in the brain.  
Please note MRI is more sensitive for acute ischemia.  
-------------------------------------------------------------------- 
OTHER:  
ORBITS/SINUSES/T-BONES:  Visualized orbits are negative. Gaze is conjugate. 
Included paranasal sinuses and mastoid air cells are clear. 
BONES/SOFT TISSUES: No acute abnormality. 
--------------------------------------------------------------------
IMPRESSION: Normal CT examination of the brain without and with contrast material. 
RADIATION DOSE REDUCTION: All CT scans are performed using radiation dose 
reduction techniques, when applicable.  Technical factors are evaluated and 
adjusted to ensure appropriate moderation of exposure.  Automated dose 
management technology is applied to adjust the radiation doses to minimize 
exposure while achieving diagnostic quality images.

## 2023-10-03 IMAGING — MR MRI BRAIN W/WO CONTRAST
12 of 16 series · 32 of 48 positions shown · IV contrast (gadavist)
Comparison: CT brain from September 07, 2023.

________________________________________________________________________________________________ 
MRI BRAIN W/WO CONTRAST, 10/03/2023 [DATE]: 
CLINICAL INDICATION: Anemia. 2 events of witnessed blacking out for a few 
seconds.
TECHNIQUE: Multiplanar, multiecho position MR images of the brain were performed 
without and with 5.5 mL of Gadavist were injected intravenously by hand. 2 mL of 
Gadavist discarded. Patient was scanned on a 3T magnet.

[Series 101: survey · axial · 10.0mm · 0.98mm/px · 1 of 5 slices shown]
[im 1/5]
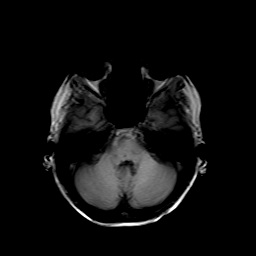

[Series 203: dadc map · axial · 4.0mm · 1.07mm/px · 1 of 30 slices shown (1 of 2)]
[im 1/30]
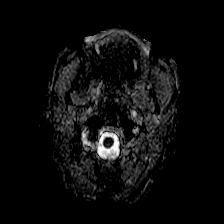

[Series 204: isob (id) · axial · 4.0mm · 1.07mm/px · 1 of 30 slices shown (1 of 2)]
[im 1/30]
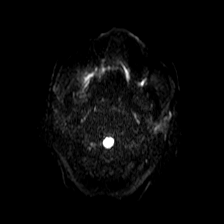

[Series 303: dadc map · coronal · 4.0mm · 0.81mm/px · 1 of 36 slices shown (2 of 2)]
[im 1/36]
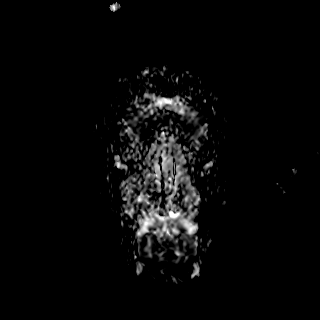

[Series 304: isob (id) · coronal · 4.0mm · 0.81mm/px · 2 of 36 slices shown (2 of 2)]
[im 1/36]
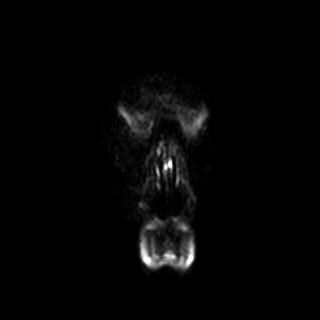
[im 36/36]
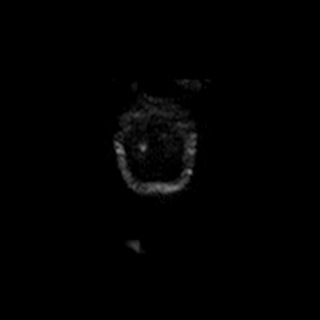

[Series 401: t1_se_sag · sagittal · 4.0mm · 0.42mm/px · 2 of 27 slices shown]
[im 1/27]
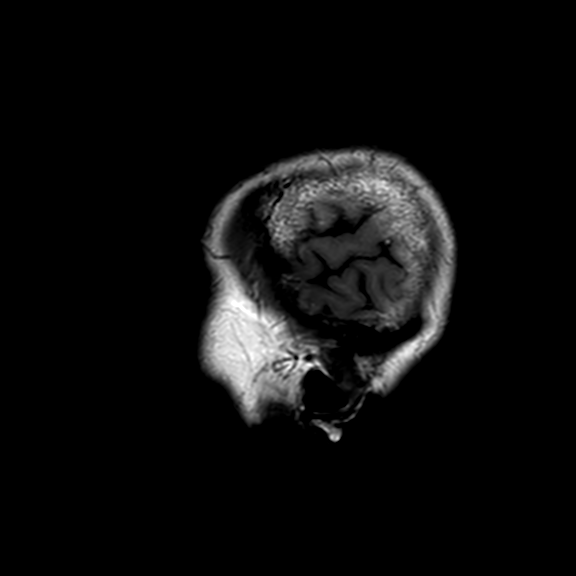
[im 27/27]
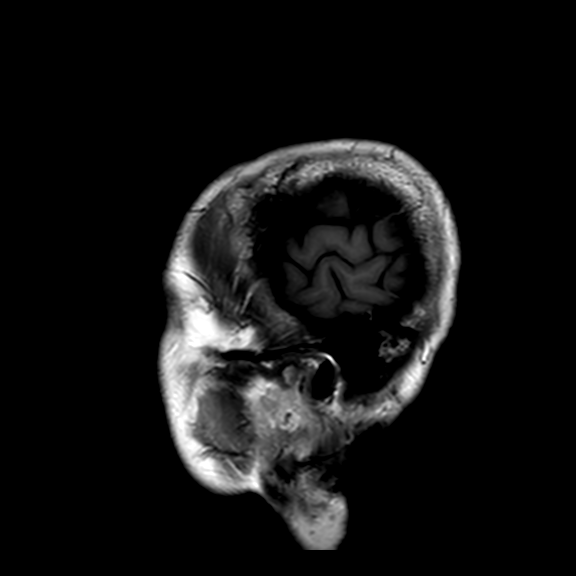

[Series 501: FLAIR fat-sat · axial · 5.0mm · 0.60mm/px · z∈[-69,+91]mm · 2 of 28 slices shown]
[im 1/28]
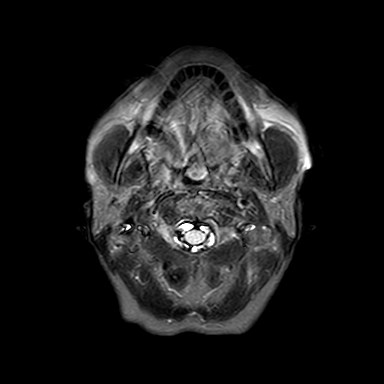
[im 28/28]
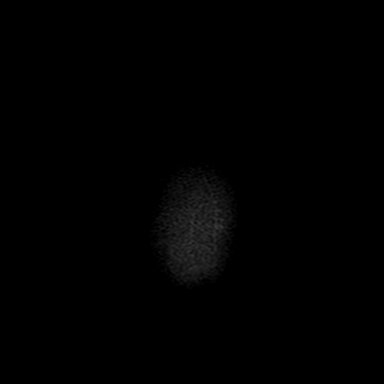

[Series 601: SWI · axial · 3.0mm · 0.53mm/px · z∈[-61,+85]mm · 6 of 100 slices shown (1 of 2)]
[im 1/100]
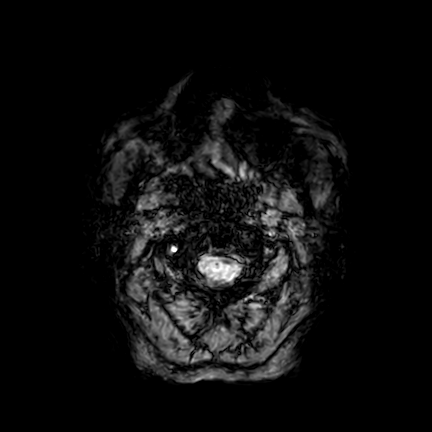
[im 20/100]
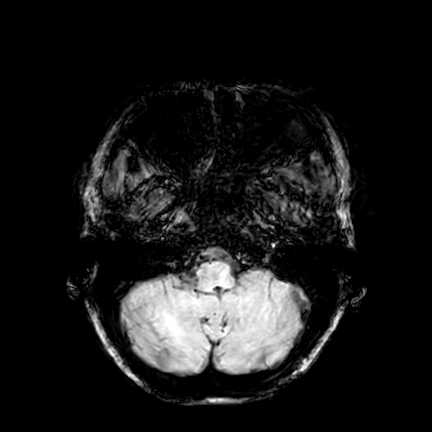
[im 40/100]
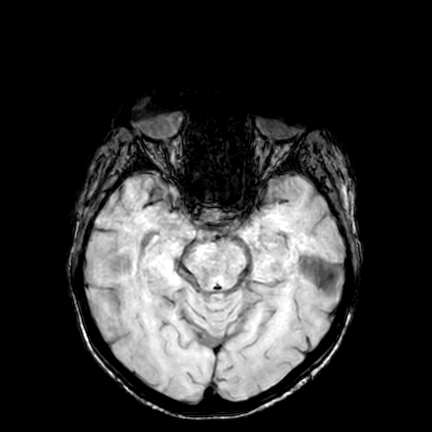
[im 60/100]
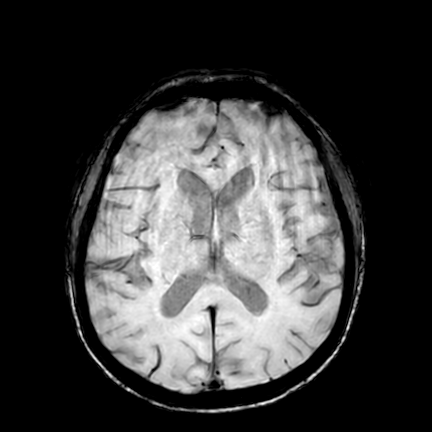
[im 80/100]
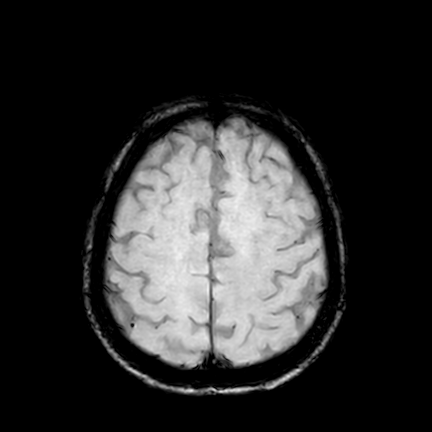
[im 100/100]
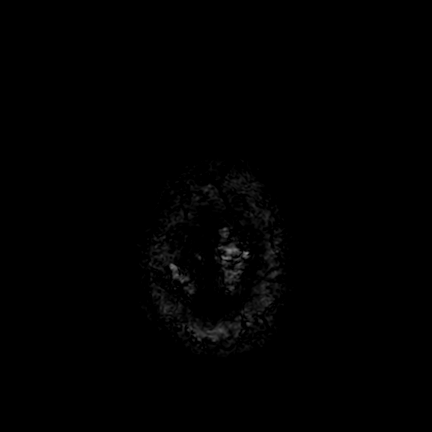

[Series 602: SWI · axial · 10.0mm · 0.53mm/px · z∈[-62,+90]mm · 4 of 78 slices shown (2 of 2)]
[im 1/78]
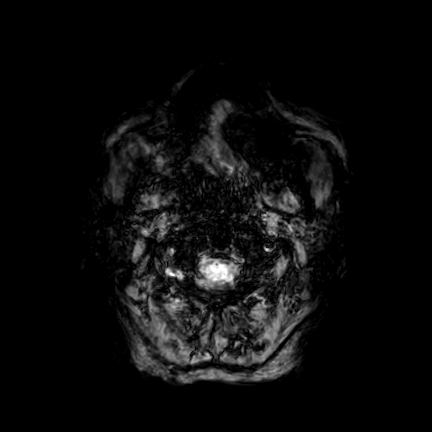
[im 26/78]
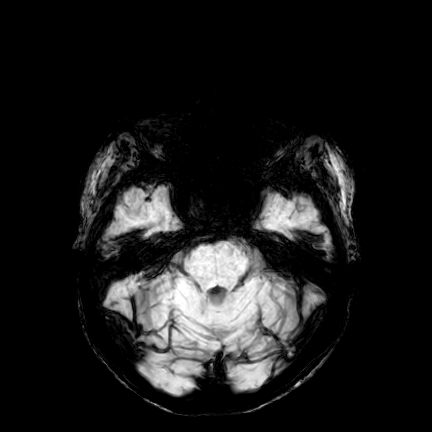
[im 52/78]
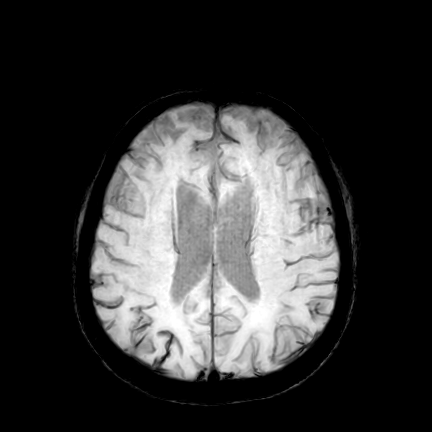
[im 78/78]
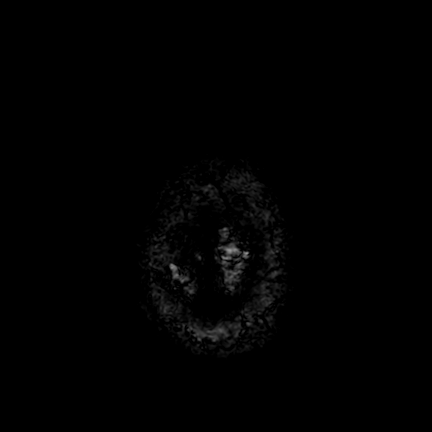

[Series 1002: T1 post-contrast · axial · 1.5mm · 0.67mm/px · z∈[-56,+103]mm · 6 of 108 slices shown (1 of 2)]
[im 1/108]
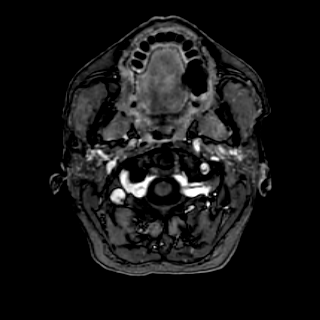
[im 22/108]
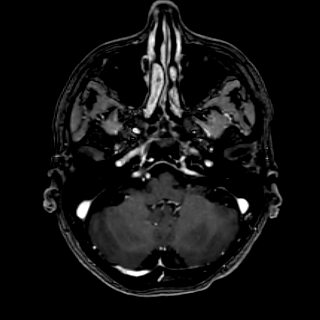
[im 43/108]
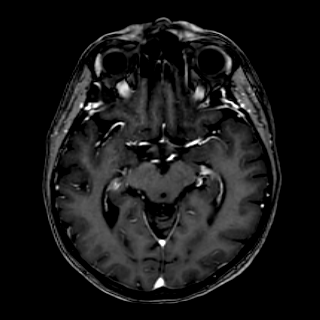
[im 65/108]
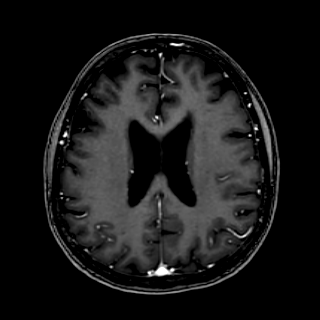
[im 86/108]
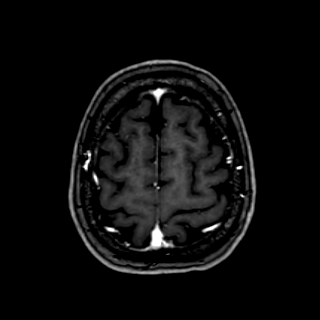
[im 108/108]
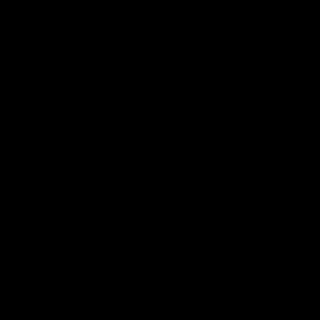

[Series 1003: T1 post-contrast · coronal · 1.5mm · 0.58mm/px · 4 of 75 slices shown (2 of 2)]
[im 1/75]
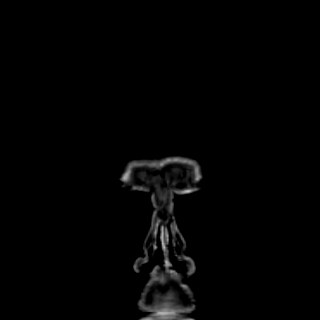
[im 25/75]
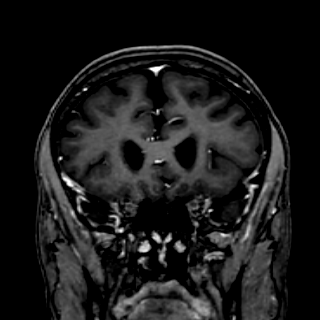
[im 50/75]
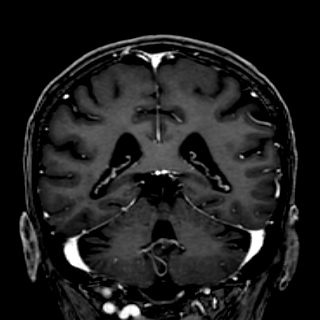
[im 75/75]
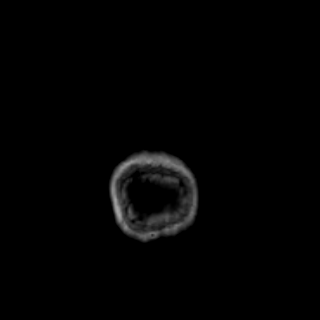

[Series 1101: T1 fat-sat post-contrast · axial · 5.0mm · 0.48mm/px · z∈[-69,+91]mm · 2 of 28 slices shown]
[im 1/28]
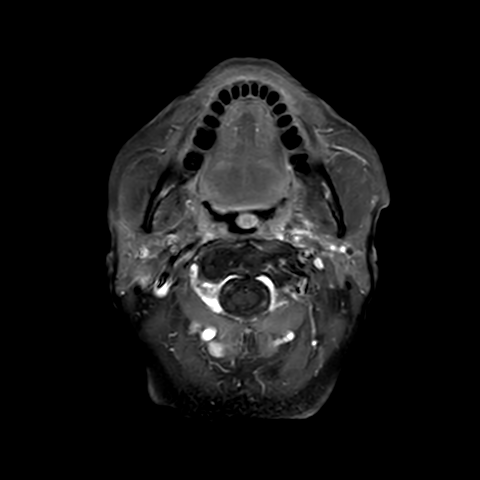
[im 28/28]
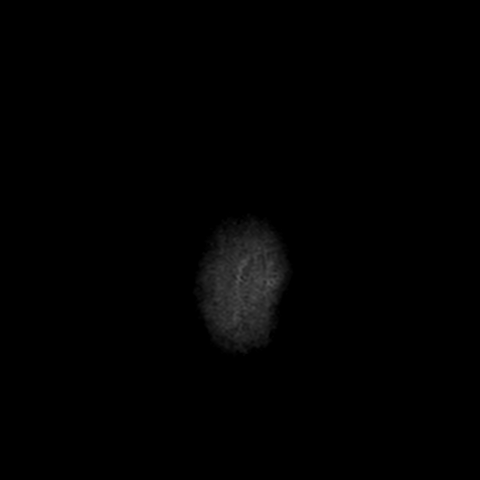

[32 of 48 positions shown; findings below may reference images not displayed]

FINDINGS: -------------------------------------------------------------------------------- 
------------------------- 
INTRACRANIAL: 
Susceptibility artifact along the right parietal sulci from hemosiderin staining 
secondary to sequela of prior subarachnoid hemorrhage. 
There is focus of encephalomalacia with susceptibility artifact along the dorsal 
pons is likely from sequelae of prior hemorrhagic lacunar infarct. 
Periventricular and deep white matter change, probably secondary to 
microangiopathy. There is a focal area of T2 prolongation along the right 
cingulate gyrus subcortical white matter just superior to the corpus callosum 
body which is likely from focal microangiopathic change. 
No acute hemorrhage, midline shift, mass effect.  No acute ischemia. No 
pathologic enhancement in the brain. Patency of intracranial vascular flow 
voids. 
-------------------------------------------------------------------------------- 
----------------------- 
OTHER: 
ORBITS/SINUSES/T-BONES:  Visualized orbits show no acute abnormality or mass.  
Mastoid air cells and middle ear cavities are grossly clear.  Visualized 
paranasal sinuses are clear. 
MARROW SIGNAL/SOFT TISSUES: No focal suspect signal abnormality.  
-------------------------------------------------------------------------------- 
-------------------
IMPRESSION: 1.  Sequelae of prior subarachnoid hemorrhage along the right parietal sulci. 
Recommend CT angiogram of the head for further evaluation of vasculature. 
2.  Prior hemorrhagic lacunar infarct along the dorsal pons. 
3.  White matter microangiopathic change.

## 2023-10-27 IMAGING — CT CTA BRAIN
4 of 13 series · 17 of 47 positions shown · IV contrast (APPLIED)
Comparison: MRI brain from October 03, 2023.

________________________________________________________________________________________________ 
CTA BRAIN, 10/27/2023 [DATE]: 
CLINICAL INDICATION: Double vision. Several transient ischemic attacks. Ovarian 
cancer. 
A search for DICOM formatted images was conducted for prior CT imaging studies 
completed at a non-affiliated media free facility.
TECHNIQUE: The head was scanned from vertex through skull base with contrast on 
a high resolution low dose CT scanner.  
100 mL Isovue 370 MDV of were injected intravenously. Routine MPR and MIP 3D 
renderings were reconstructed on an independent workstation with concurrent 
physician supervision.

[Series 6: bone w/o · axial · non-contrast · 0.42mm/px · z∈[-167,-60]mm · 7 of 231 slices shown]
[im 26/231  bone]
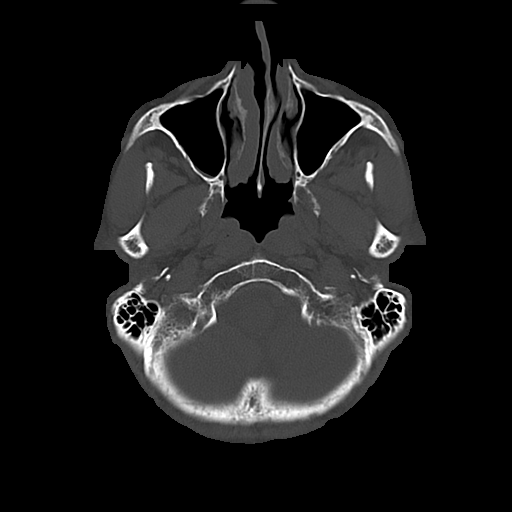
[im 52/231  bone]
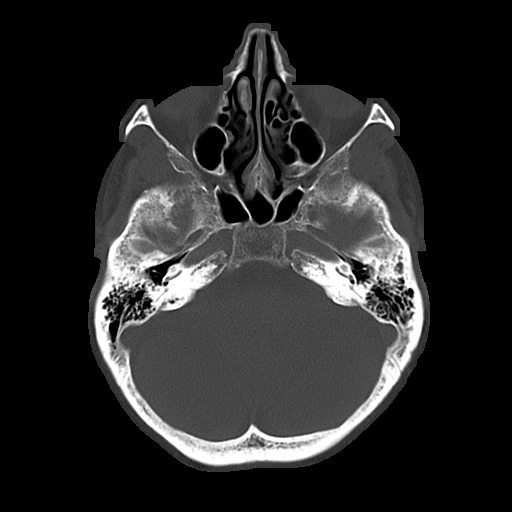
[im 77/231  bone]
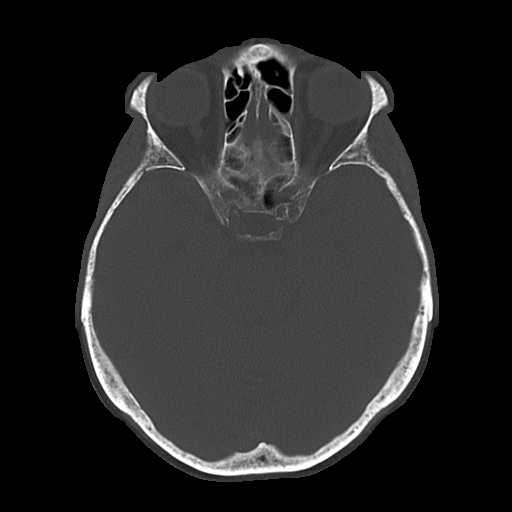
[im 103/231  bone]
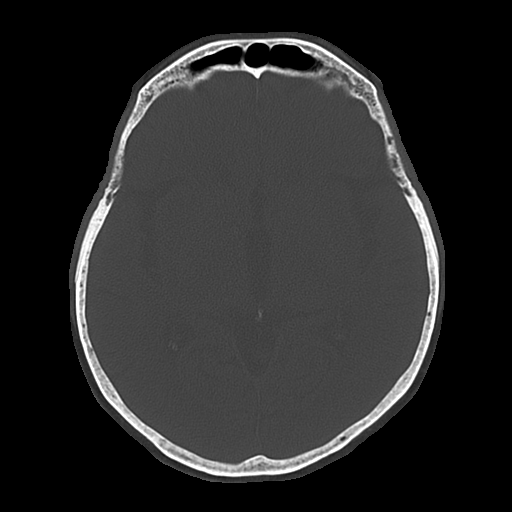
[im 128/231  bone]
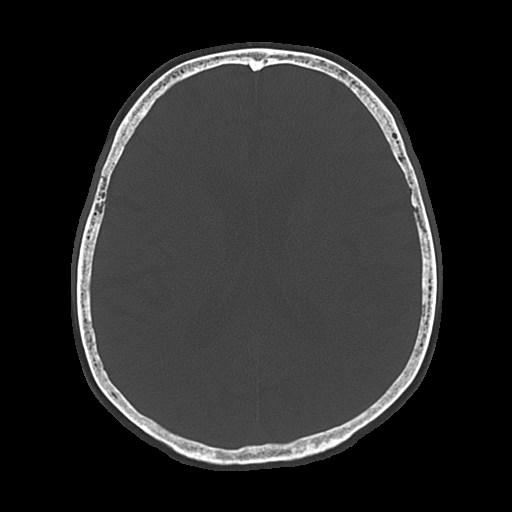
[im 154/231  bone]
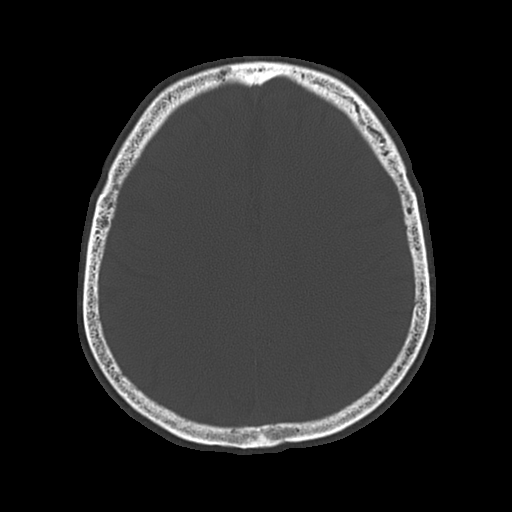
[im 179/231  bone]
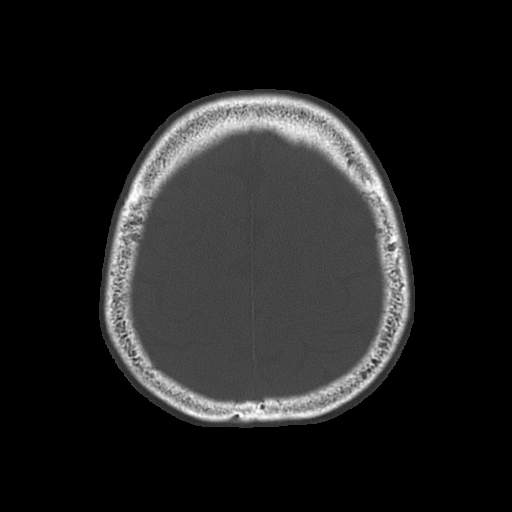

[Series 9: axial cta · axial · 0.38mm/px · z∈[-167,-56]mm · 7 of 213 slices shown]
[im 27/213  brain]
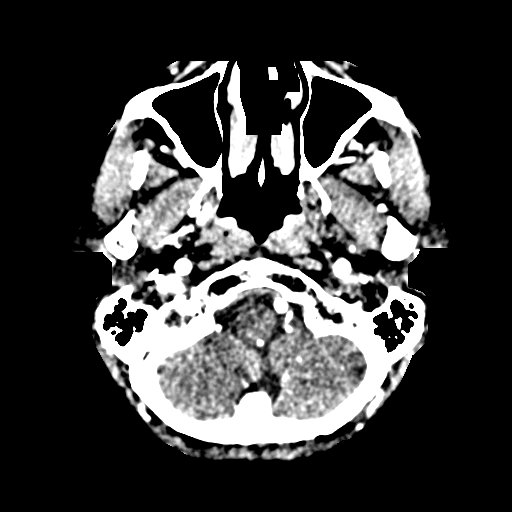
[im 54/213  bone]
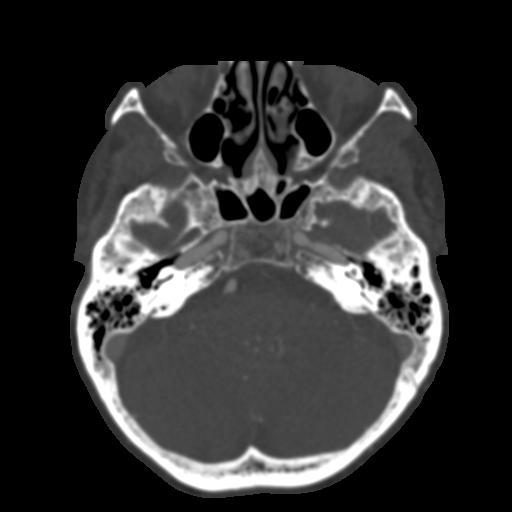
[im 80/213  brain]
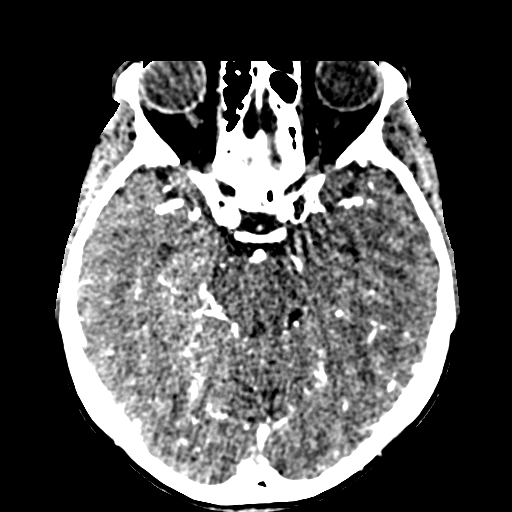
[im 107/213  bone]
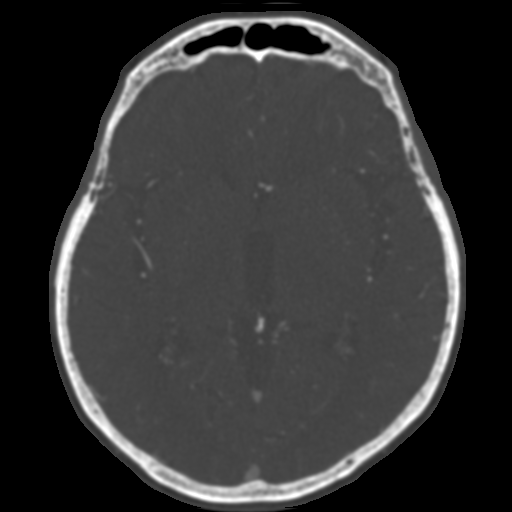
[im 133/213  brain]
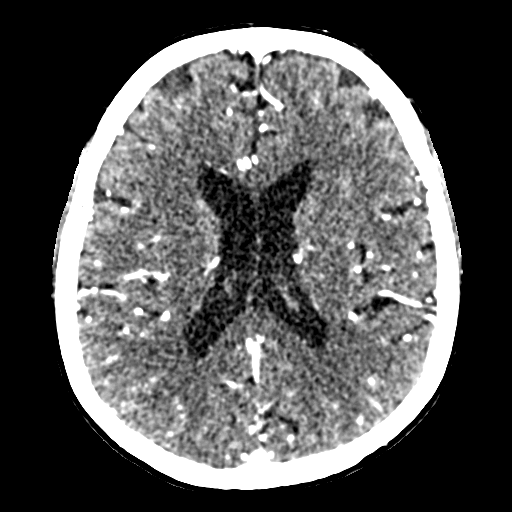
[im 160/213  bone]
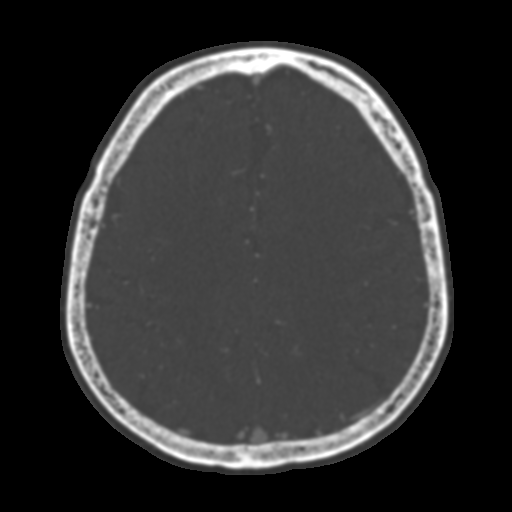
[im 186/213  brain]
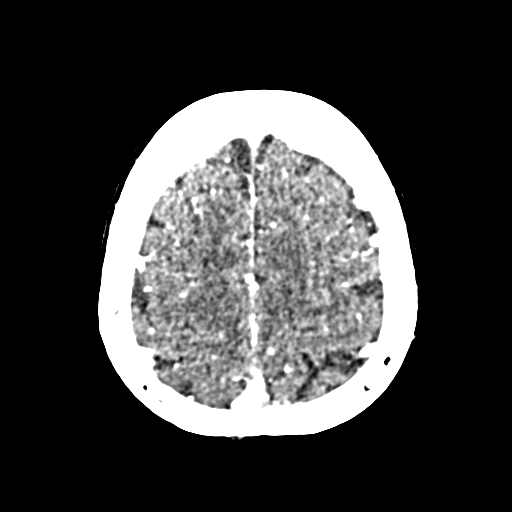

[Series 10: cor cta · coronal · 0.30mm/px · 2 of 285 slices shown]
[im 95/285  brain]
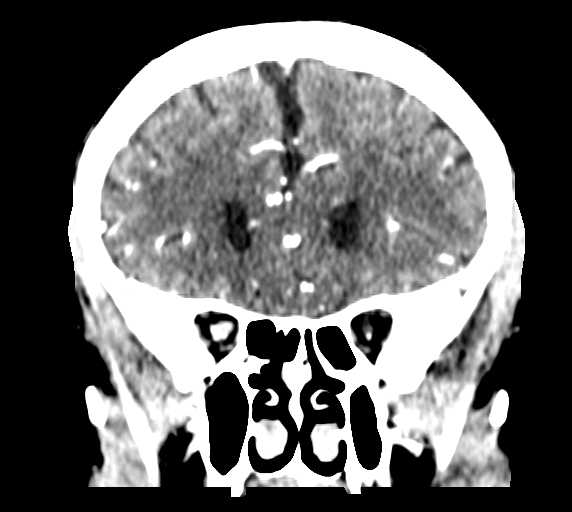
[im 190/285  brain]
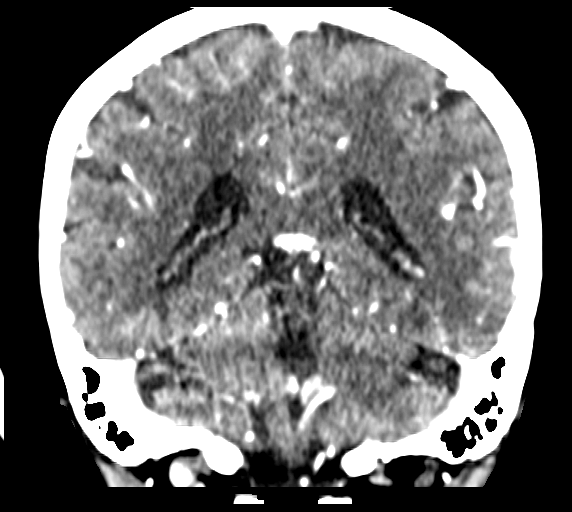

[Series 11: sag cta · sagittal · 0.29mm/px · 1 of 278 slices shown]
[im 139/278  brain]
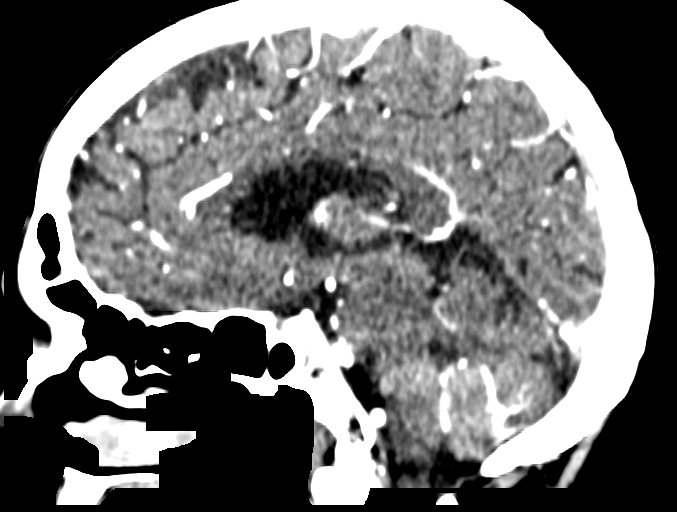

[17 of 47 positions shown; findings below may reference images not displayed]

CT brain from September 07, 2023.   
Count of known CT and Cardiac Nuclear Medicine studies performed in the previous 
12 months = 3.
FINDINGS: --------------------------------------------------------------------------- 
Angiography: 
RIGHT ANTERIOR: Patency of the distal right cervical internal carotid artery. 
Intracranial right internal carotid artery is patent. Right anterior cerebral 
artery and its branches are patent. The right middle cerebral artery and its 
branches are patent. No aneurysms or vascular malformations along the right 
anterior circulation. 
LEFT ANTERIOR: Patency of the left cervical internal carotid artery distally. 
Intracranial left internal carotid artery is patent. Left anterior cerebral 
artery and its branches are patent. Left middle cerebral artery and its branches 
are patent. No aneurysms or vascular malformations along left anterior 
circulation. 
POSTERIOR: Dominant left vertebral artery. Intracranial internal carotid 
arteries are patent. Basilar artery is patent. Bilateral posterior cerebral 
arteries are patent. No aneurysms or vascular malformation along the posterior 
circulation. 
--------------------------------------------------------------------------- 
Nonvascular: 
No acute intracranial hemorrhage, mass effect, midline shift. Stable cerebral 
volume.  Along the right parietal sulci there is asymmetric sulcal enhancement 
without focal aneurysm or vascular malformation. This is present in retrospect 
along the right parietal sulci and is similar in appearance allowing for 
differences in modality. Status post bilateral cataract extraction. Mild mucosal 
thickening in left ethmoidal air cells and bilateral maxillary sinuses. Mastoid 
air cells and middle ear cavities are clear. 
---------------------------------------------------------------------------
IMPRESSION: 1.  Patency of intracranial vasculature without evidence of aneurysm or vascular 
malformation. 
2.  Right parietal sulcal enhancement. Given history of ovarian cancer, further 
evaluation with CSF analysis is recommended as this does raise concern for 
leptomeningeal carcinomatosis. 
RADIATION DOSE REDUCTION: All CT scans are performed using radiation dose 
reduction techniques, when applicable.  Technical factors are evaluated and 
adjusted to ensure appropriate moderation of exposure.  Automated dose 
management technology is applied to adjust the radiation doses to minimize 
exposure while achieving diagnostic quality images.

## 2023-11-25 IMAGING — MR MRI CERVICAL SPINE W/WO CONTRAST
8 of 11 series · 29 of 48 positions shown · IV contrast (gadavist)
Comparison: None.

________________________________________________________________________________________________ 
MRI CERVICAL SPINE W/WO CONTRAST, 11/25/2023 [DATE]: 
CLINICAL INDICATION: History of TIA. Ovarian cancer. History of suspected prior 
leptomeningeal carcinomatosis.
TECHNIQUE: Multiplanar, multiecho position MR images of the cervical spine were 
performed without and with 5.5 mL of Gadavist injected intravenously by hand. 2 
mL of Gadavist discarded. Patient was scanned on a 1.5T magnet.

[Series 901: survey · axial · 10.0mm · 1.25mm/px · z∈[-34,+150]mm · 3 of 10 slices shown]
[im 1/10]
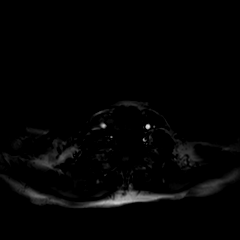
[im 5/10]
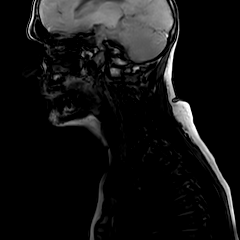
[im 10/10]
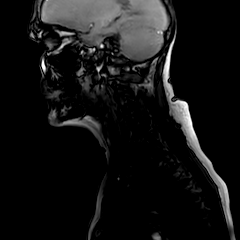

[Series 1001: t2w_cor-surv · coronal · 5.0mm · 0.69mm/px · 2 of 7 slices shown]
[im 1/7]
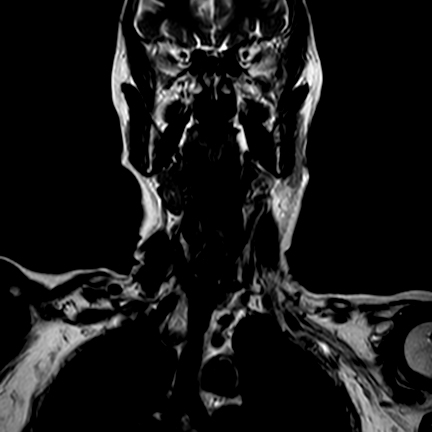
[im 7/7]
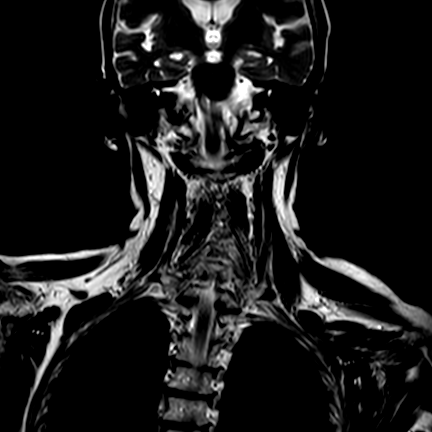

[Series 1101: t1_sag · sagittal · 3.0mm · 0.36mm/px · 3 of 16 slices shown]
[im 1/16]
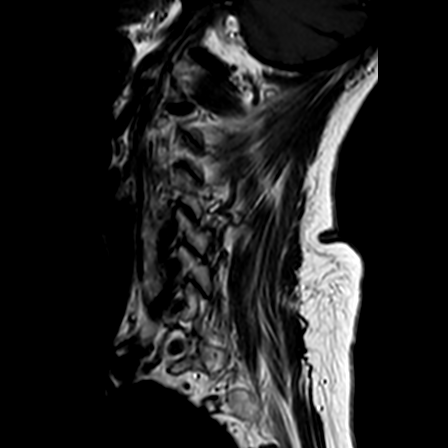
[im 8/16]
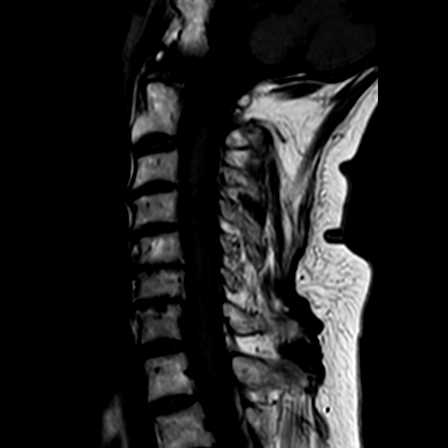
[im 16/16]
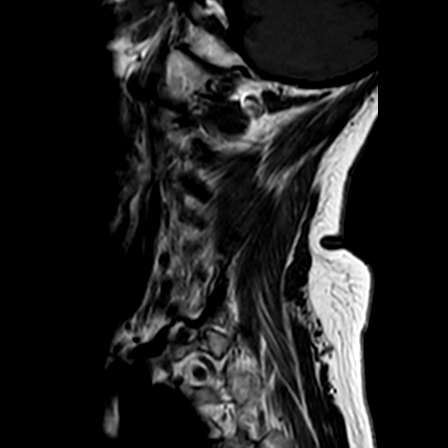

[Series 1202: (id)_mdixon_tse · sagittal · 3.0mm · 0.42mm/px · 3 of 16 slices shown]
[im 1/16]
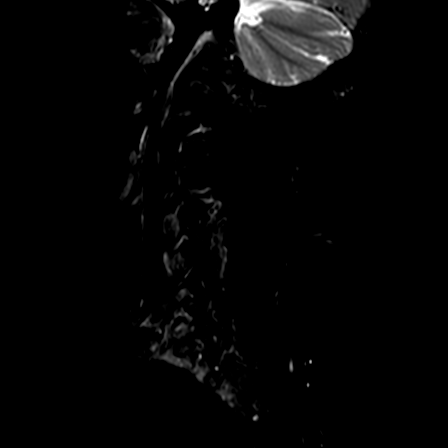
[im 8/16]
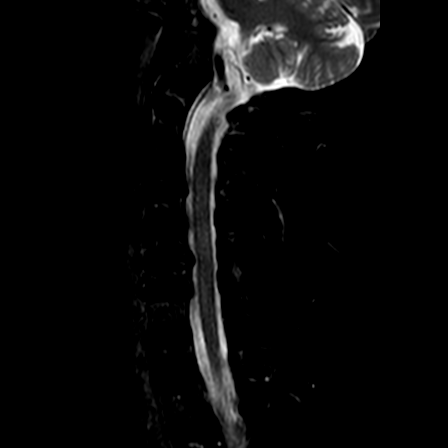
[im 16/16]
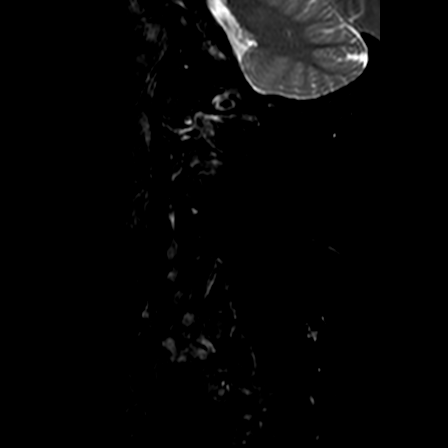

[Series 1203: st2w_mdixon_tse · sagittal · 3.0mm · 0.42mm/px · 3 of 16 slices shown]
[im 1/16]
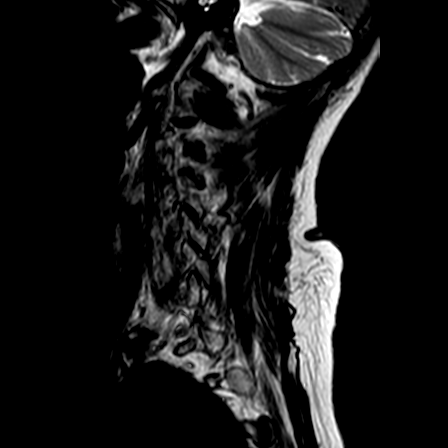
[im 8/16]
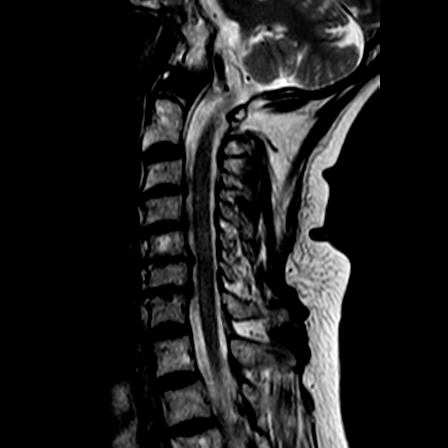
[im 16/16]
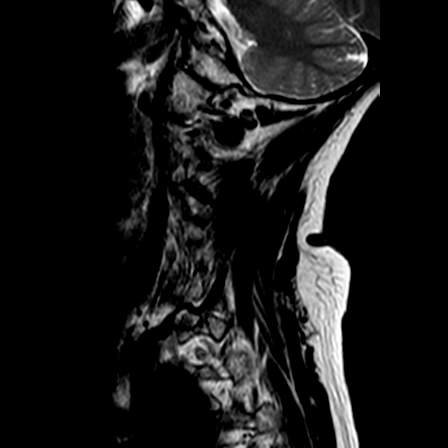

[Series 1301: t2_ffe ax · axial · 3.0mm · 0.62mm/px · z∈[-88,+16]mm · 7 of 35 slices shown]
[im 1/35]
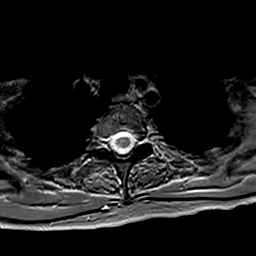
[im 6/35]
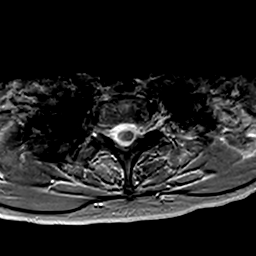
[im 12/35]
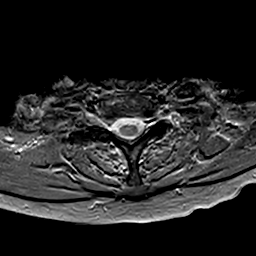
[im 18/35]
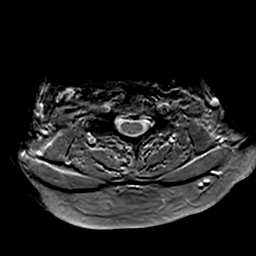
[im 23/35]
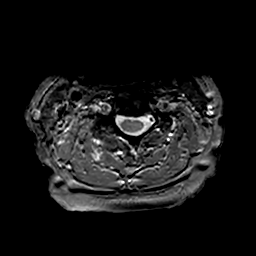
[im 29/35]
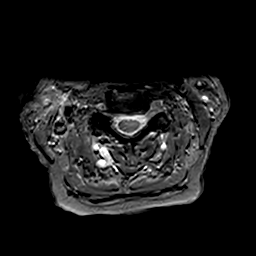
[im 35/35]
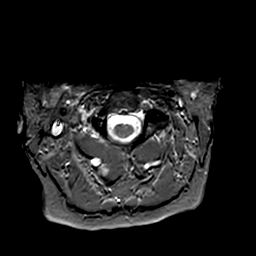

[Series 1401: T2 · axial · 3.0mm · 0.31mm/px · z∈[-88,+16]mm · 7 of 35 slices shown]
[im 1/35]
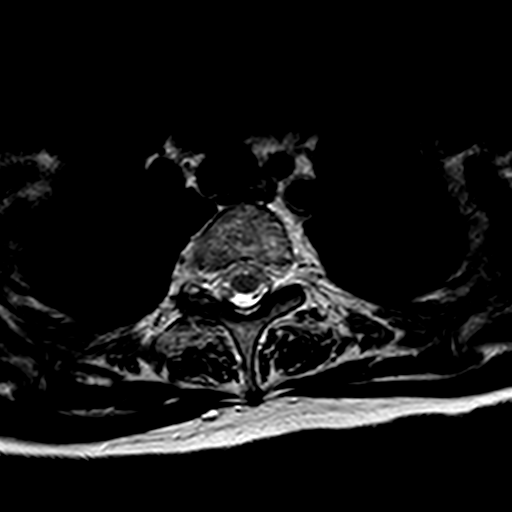
[im 6/35]
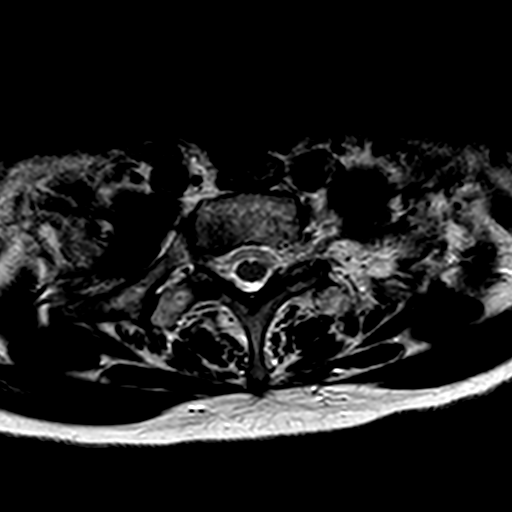
[im 12/35]
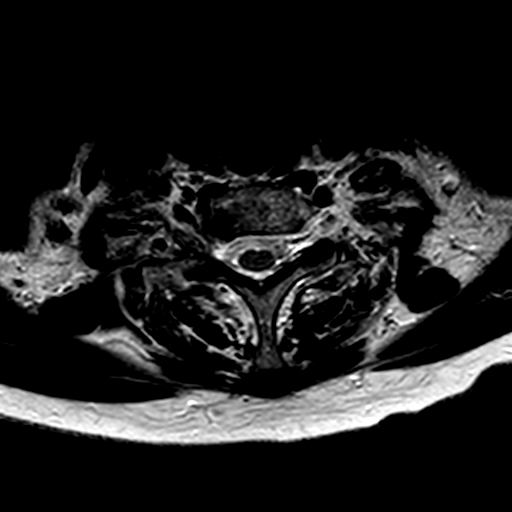
[im 18/35]
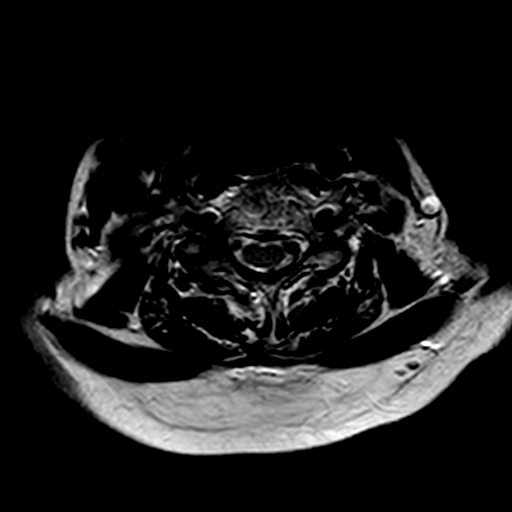
[im 23/35]
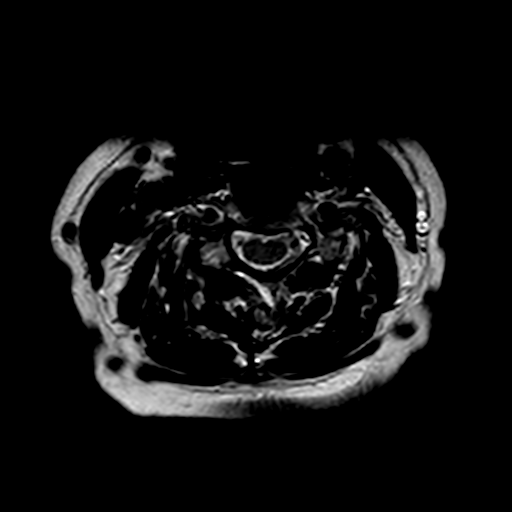
[im 29/35]
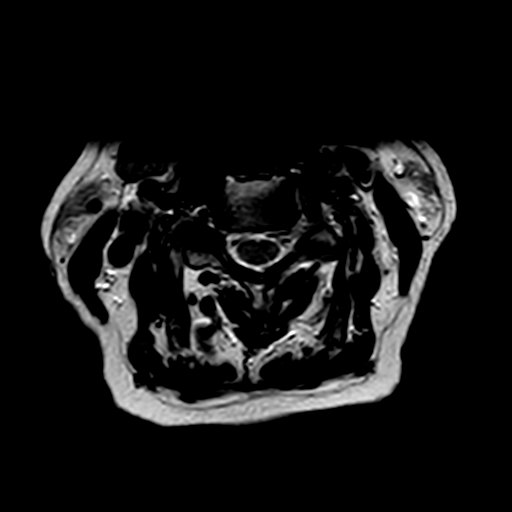
[im 35/35]
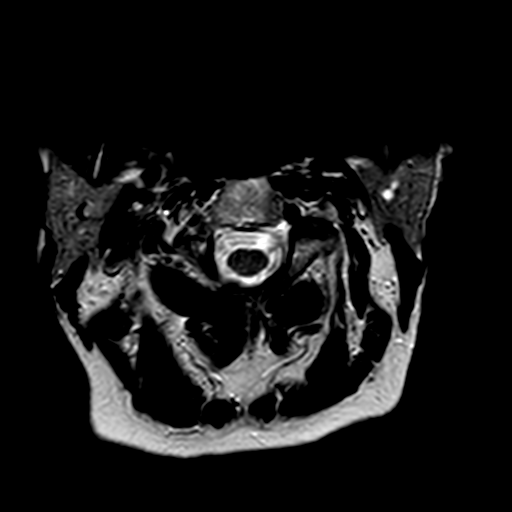

[Series 1501: t1_(person_name)_(person_name) · axial · 3.0mm · 0.59mm/px · 1 of 35 slices shown]
[im 1/35]
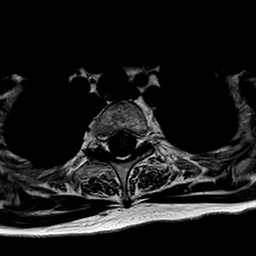

[29 of 48 positions shown; findings below may reference images not displayed]

FINDINGS: -------------------------------------------------------------------------------- 
----------------- 
GENERAL: 
ALIGNMENT: Leftward curvature of the cervical spine. Mild retrolisthesis of C3 
on C4. Mild anterolisthesis of C4 on C5. Mild retrolisthesis of C6 on C7. 
VERTEBRAL BODY HEIGHT: Normal.  
MARROW SIGNAL: No focal suspect signal abnormality. 
CORD SIGNAL: Cord signal is normal without pathologic enhancement.  
ADDITIONAL FINDINGS: None. 
-------------------------------------------------------------------------------- 
---------------- 
SEGMENTAL: 
CRANIOCERVICAL JUNCTION: No significant stenosis. 
C2-C3: Left facet hypertrophy. No significant central canal or neural foraminal 
narrowing.  
C3-C4: Disc osteophyte complex with right uncovertebral joint hypertrophy. Right 
facet hypertrophy. Mild central canal narrowing. Moderate right and no 
significant left neural foraminal narrowing. 
C4-C5: Disc osteophyte complex with right uncovertebral joint hypertrophy. Right 
facet hypertrophy. No significant central canal narrowing. Mild right and no 
significant left neural foraminal narrowing. 
C5-C6: Loss of disc height. Disc osteophyte complex with bilateral uncovertebral 
joint hypertrophy. Mild deformity of ventral cord with mild central canal 
narrowing. Moderate left neural foraminal narrowing. Mild right neural foraminal 
narrowing. 
C6-C7: Loss of disc height. Disc osteophyte complex. Bilateral uncovertebral 
joint hypertrophy. Mild central canal narrowing. Mild left neural foraminal 
narrowing. No significant right neural foraminal narrowing. 
C7-T1: No significant central canal or neural foraminal narrowing. 
-------------------------------------------------------------------------------- 
---------------
IMPRESSION: 1.  Discogenic/degenerative changes as above. 
2.  No cord signal abnormality or pathologic enhancement. 
3.  Cord deformity: C5-C6 
4.  Worst level(s) of neural foraminal narrowing: C3-C4 (moderate right), C5-C6 
(moderate left).

## 2023-11-25 IMAGING — MR MRI BRAIN W/WO CONTRAST
2 of 16 series · 4 of 48 positions shown · IV contrast (Gadolinium)
Comparison: CTA circle of Willis October 27, 2023. Brain MRI October 03, 2023. 
Head CT September 07, 2023.

________________________________________________________________________________________________ 
MRI BRAIN W/WO CONTRAST, 11/25/2023 [DATE]: 
CLINICAL INDICATION: Nontraumatic Subarachnoid Hemorrhage, Unspecified , TIA 
August 2023. No recent trauma. History of ovarian cancer. Evaluate for 
leptomeningeal disease.
TECHNIQUE: Multiplanar, multiecho position MR images of the brain were performed 
without and with 5.5 mL of Gadavist were injected intravenously by hand. 2 mL of 
Gadavist discarded. Patient was scanned on a 1.5T magnet.

[Series 1902: T1 post-contrast · coronal · 1.0mm · 0.24mm/px · 3 of 180 slices shown (1 of 2)]
[im 26/180]
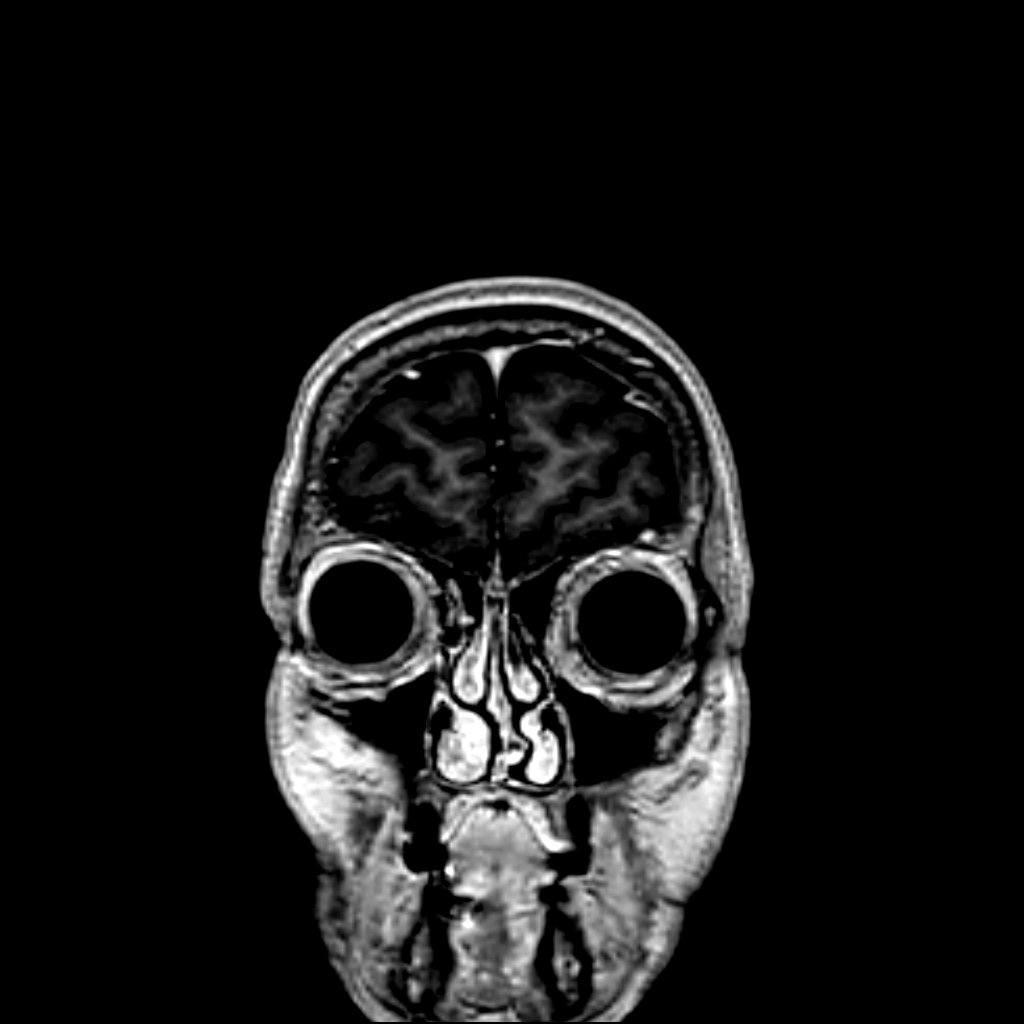
[im 103/180]
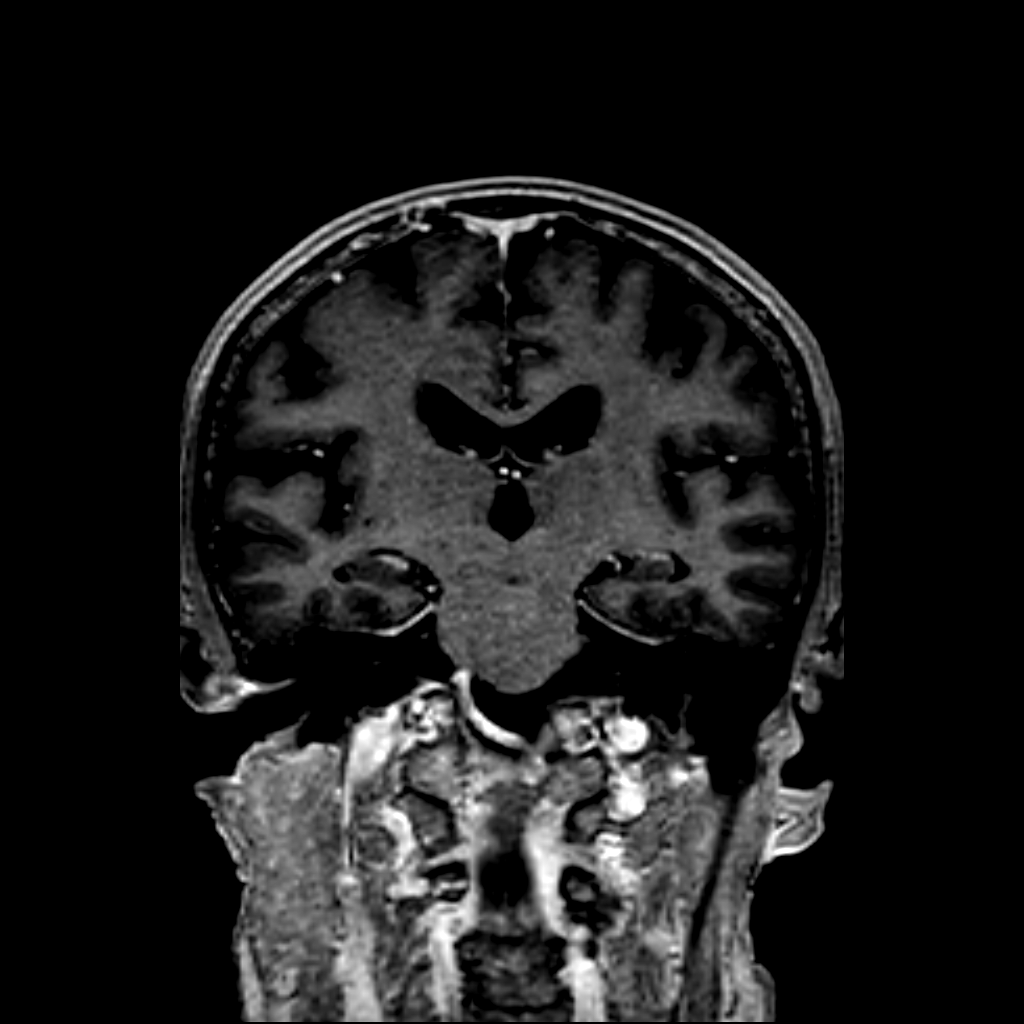
[im 154/180]
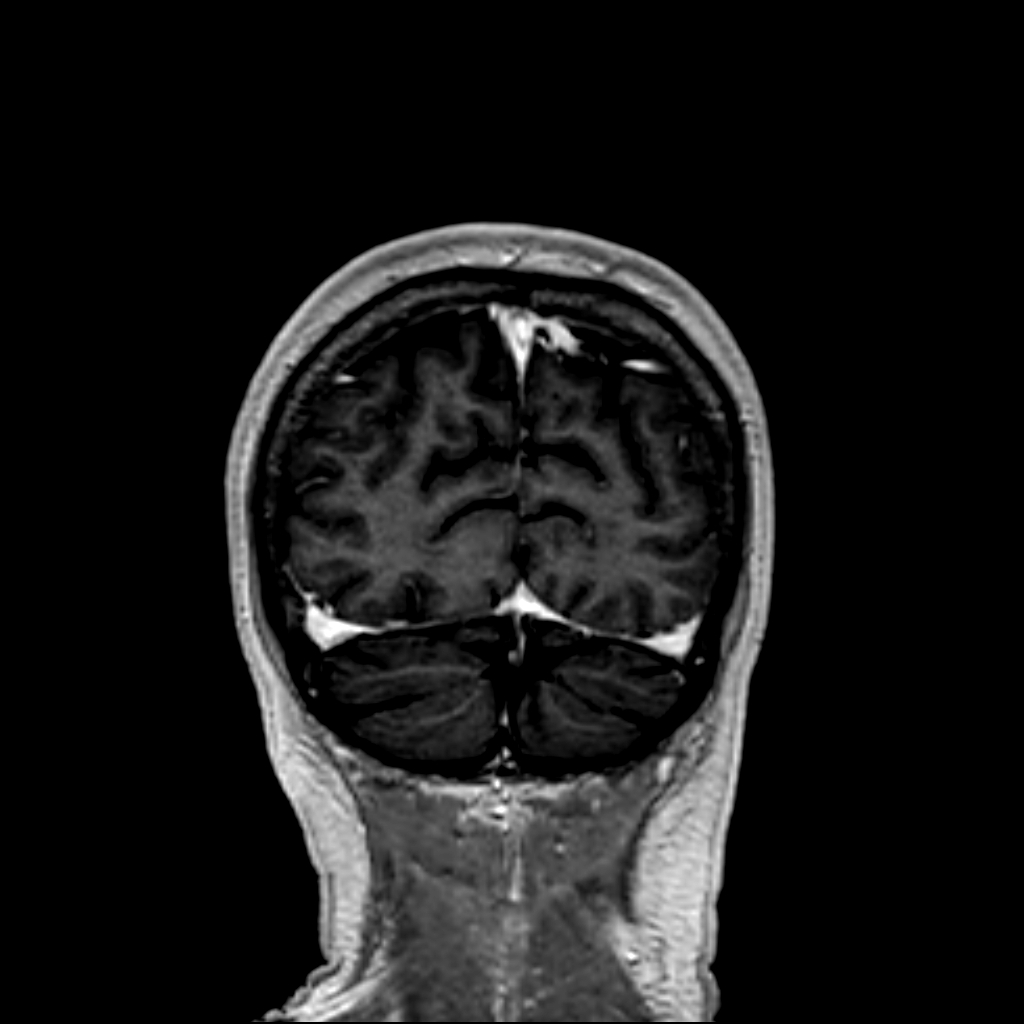

[Series 1903: T1 post-contrast · axial · 1.0mm · 0.24mm/px · 1 of 162 slices shown (2 of 2)]
[im 27/162]
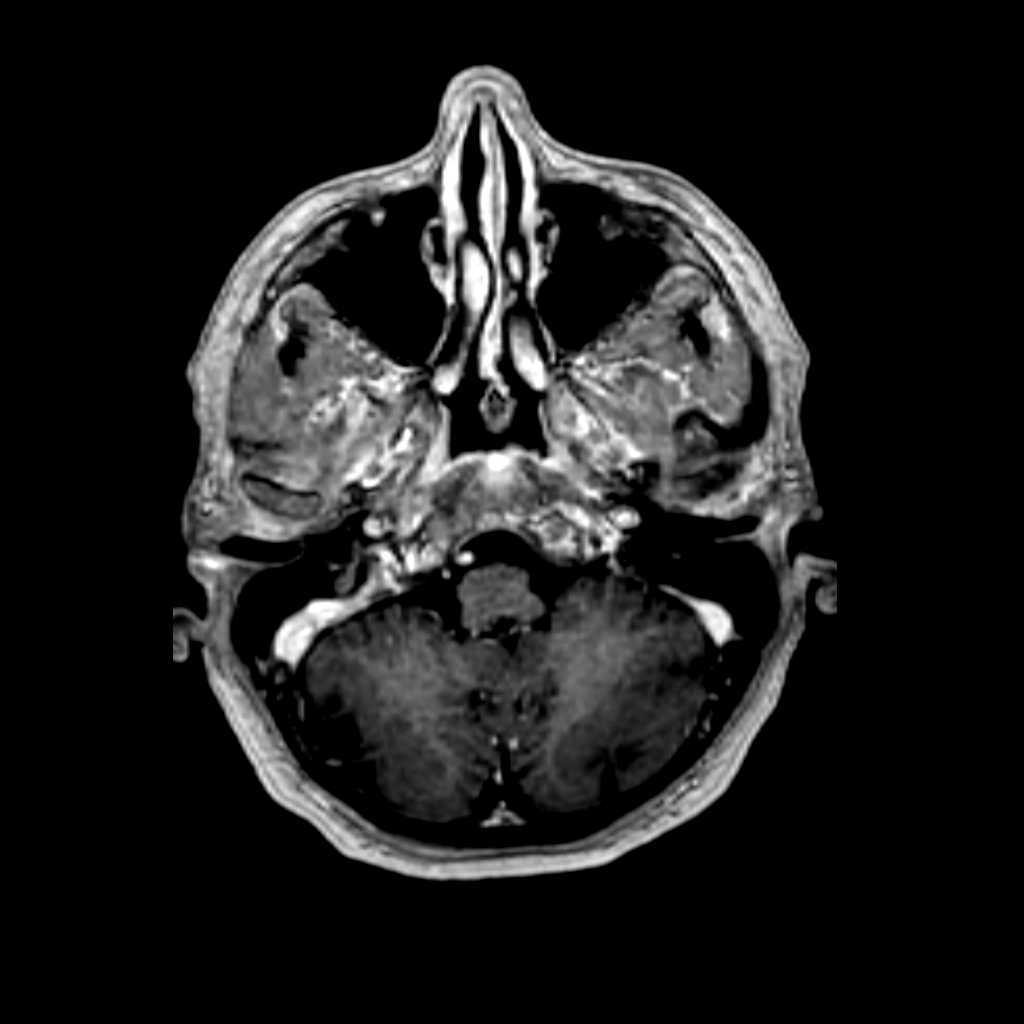

[4 of 48 positions shown; findings below may reference images not displayed]

FINDINGS: -------------------------------------------------------------------------------- 
------------------------- 
INTRACRANIAL: 
There is stable mild nonspecific periventricular and deep white matter T2 FLAIR 
hyperintensity, including a focus adjacent to the right cingulate gyrus. 
Findings are likely reflective of chronic microangiopathic changes. There is 
stable focus of susceptibility in the right paramedian dorsal pons with findings 
suggestive of old tiny hemorrhagic infarct. No abnormal leptomeningeal 
enhancement is identified. The previously noted enhancement within the right 
parietal lobe is no longer identified. On the current study, there is no 
abnormal intracranial enhancement. 
Stable gyriform type susceptibility artifact within the right parietal lobe 
likely related to old subarachnoid hemorrhage. There are a few new foci of 
punctate appearing susceptibility artifact in the adjacent right parietal lobe. 
No acute ischemia.. Patency of the intracranial vascular flow voids.  No acute 
intracranial hemorrhage, mass effect, midline shift. No large sellar mass. No 
hydrocephalus. Cerebral volume is age appropriate.  No pathologic contrast 
enhancement.  
-------------------------------------------------------------------------------- 
----------------------- 
OTHER: 
ORBITS/SINUSES/T-BONES:  Bilateral pseudophakia.  Mastoid air cells and middle 
ear cavities are grossly clear.  Mild maxillary sinus membrane thickening. 
MARROW SIGNAL/SOFT TISSUES: No focal suspect signal abnormality. 
-------------------------------------------------------------------------------- 
-------------------
IMPRESSION: No convincing evidence of abnormal intracranial enhancement. Specifically, there 
is no evidence of leptomeningeal enhancement. The previously noted enhancement 
in the right parietal lobe is no longer identified. The etiology of this finding 
remains uncertain. Would recommend follow-up brain MRI using similar technique 
as todays study, without and with gadolinium, in 3-6 months, as there are a few 
new foci of punctate appearing susceptibility artifact in the adjacent right 
parietal lobe.. 
Stable prior hemorrhagic infarct which is tiny involving the right paramedian 
dorsal pons. 
No acute intracranial process.

## 2023-11-27 IMAGING — MR MRI LUMBAR SPINE W/WO CONTRAST
6 of 12 series · 11 of 48 positions shown · IV contrast (Gadolinium)
Comparison: None.

________________________________________________________________________________________________ 
MRI THORACIC SPINE W/WO CONTRAST, MRI LUMBAR SPINE W/WO CONTRAST, 11/27/2023 
[DATE]: 
CLINICAL INDICATION: Nontraumatic Subarachnoid Hemorrhage, Unspecified
TECHNIQUE: Multiplanar, multiecho position MR images of the thoracic and lumbar 
spine were performed without and with intravenous gadolinium enhancement. 5.5 mL 
of Gadavistwere injected intravenously by hand. 2 mL of Gadavist discarded. 
Patient was scanned on a 1.5T magnet.

[Series 801: survey · axial · 10.0mm · 1.25mm/px · 1 of 10 slices shown]
[im 1/10]
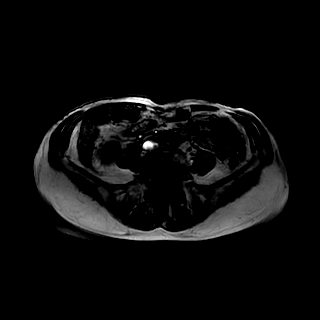

[Series 901: t2w_cor-surv · coronal · 6.0mm · 0.62mm/px · 1 of 11 slices shown]
[im 1/11]
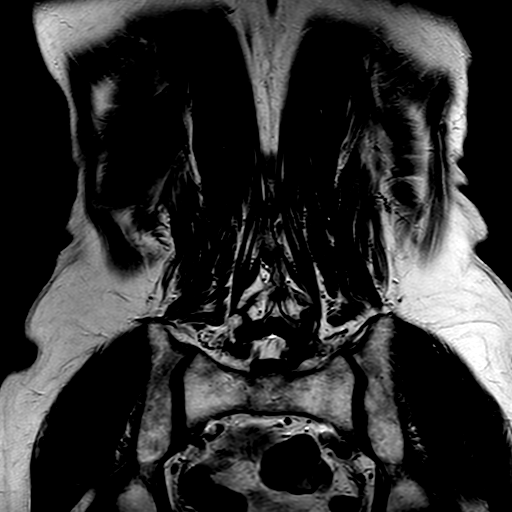

[Series 1001: t1_tse_sag · sagittal · 4.0mm · 0.42mm/px · 2 of 19 slices shown]
[im 1/19]
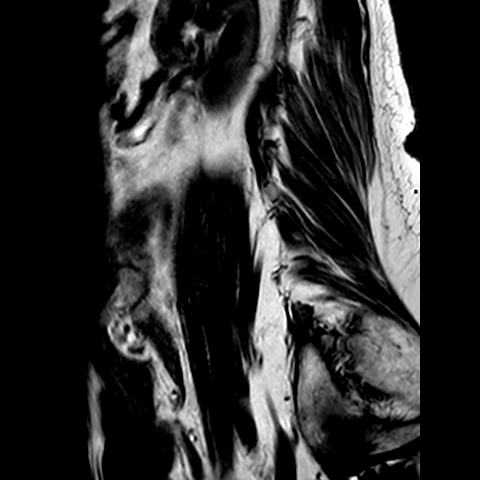
[im 19/19]
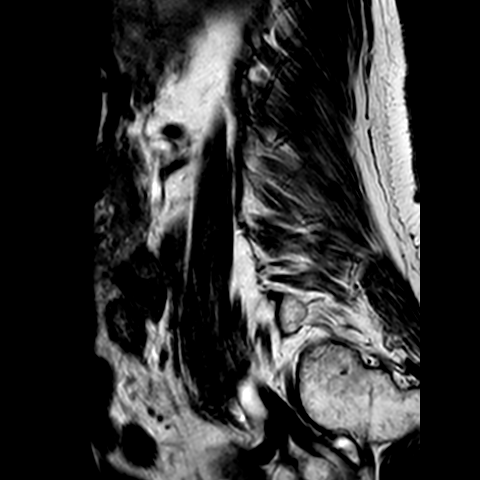

[Series 1102: (id)_mdixon_tse · sagittal · 4.0mm · 0.51mm/px · 2 of 19 slices shown]
[im 1/19]
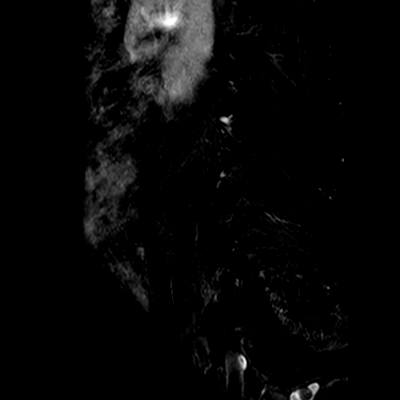
[im 19/19]
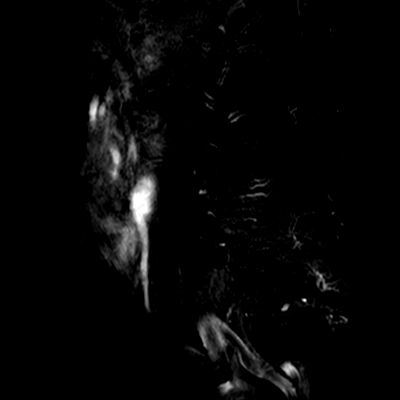

[Series 1103: st2w_mdixon_tse · sagittal · 4.0mm · 0.51mm/px · 2 of 19 slices shown]
[im 1/19]
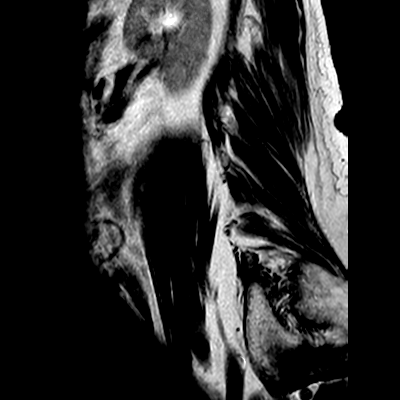
[im 19/19]
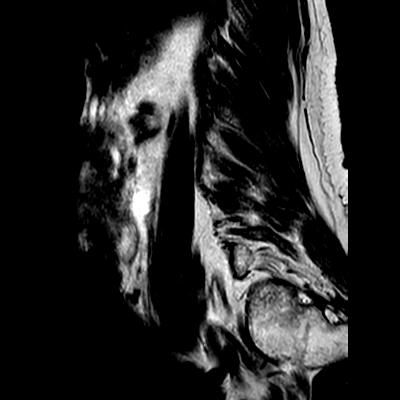

[Series 1202: (id) view_ax mpr · axial · 1.0mm · 0.25mm/px · z∈[-457,-386]mm · 3 of 143 slices shown]
[im 1/143]
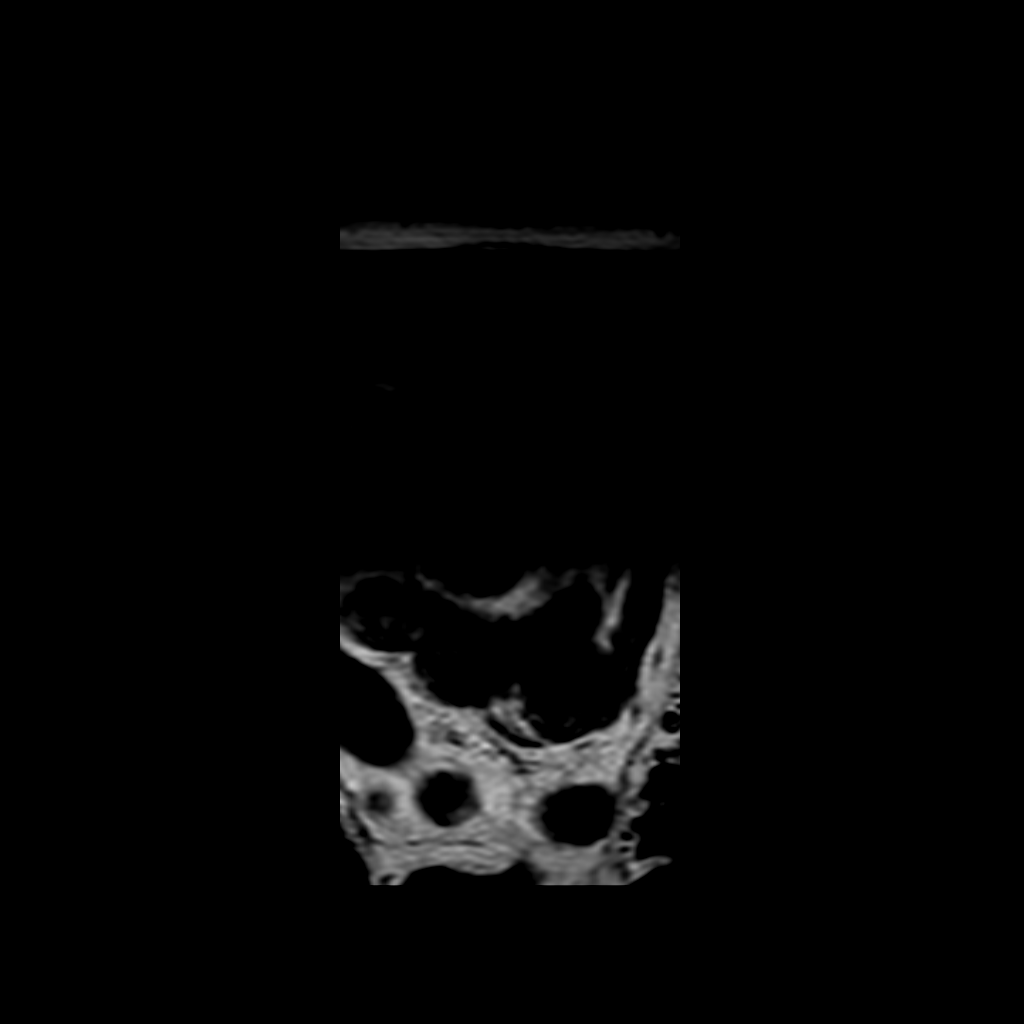
[im 21/143]
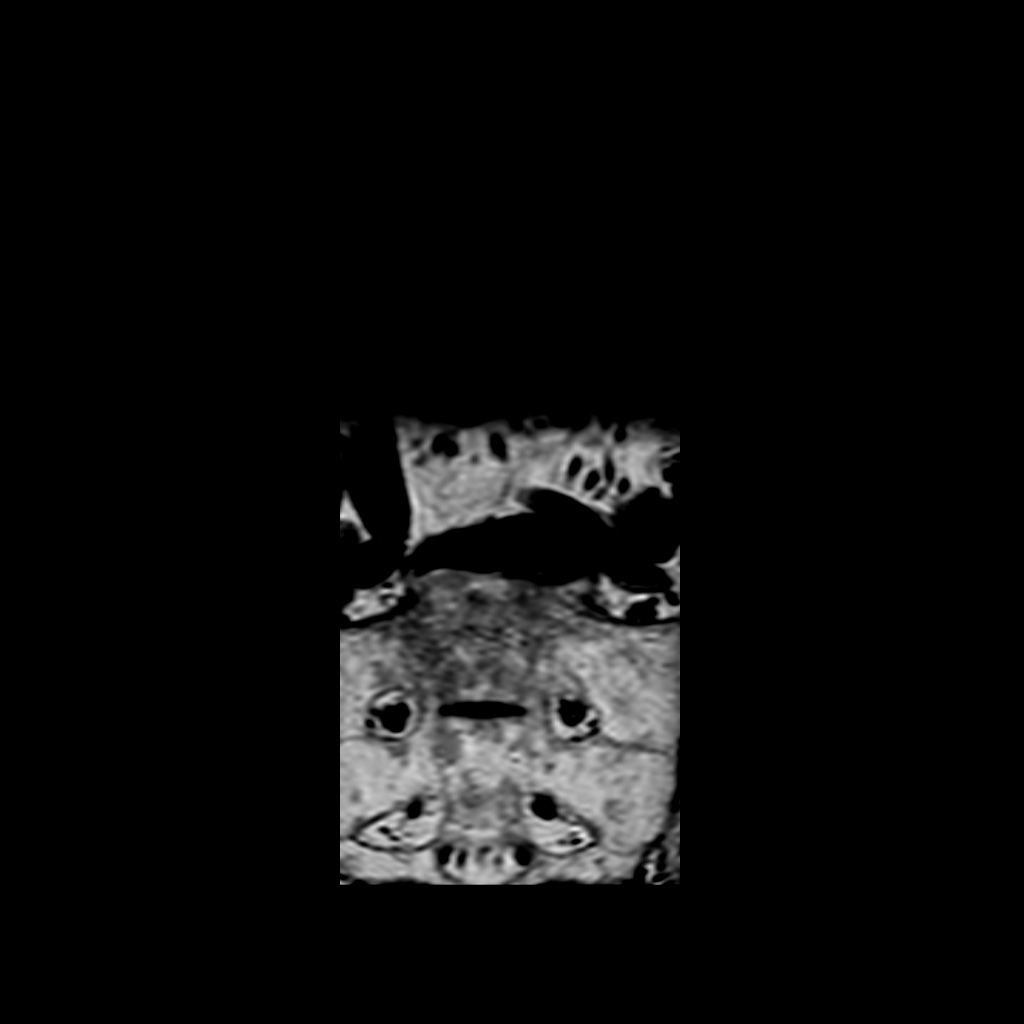
[im 41/143]
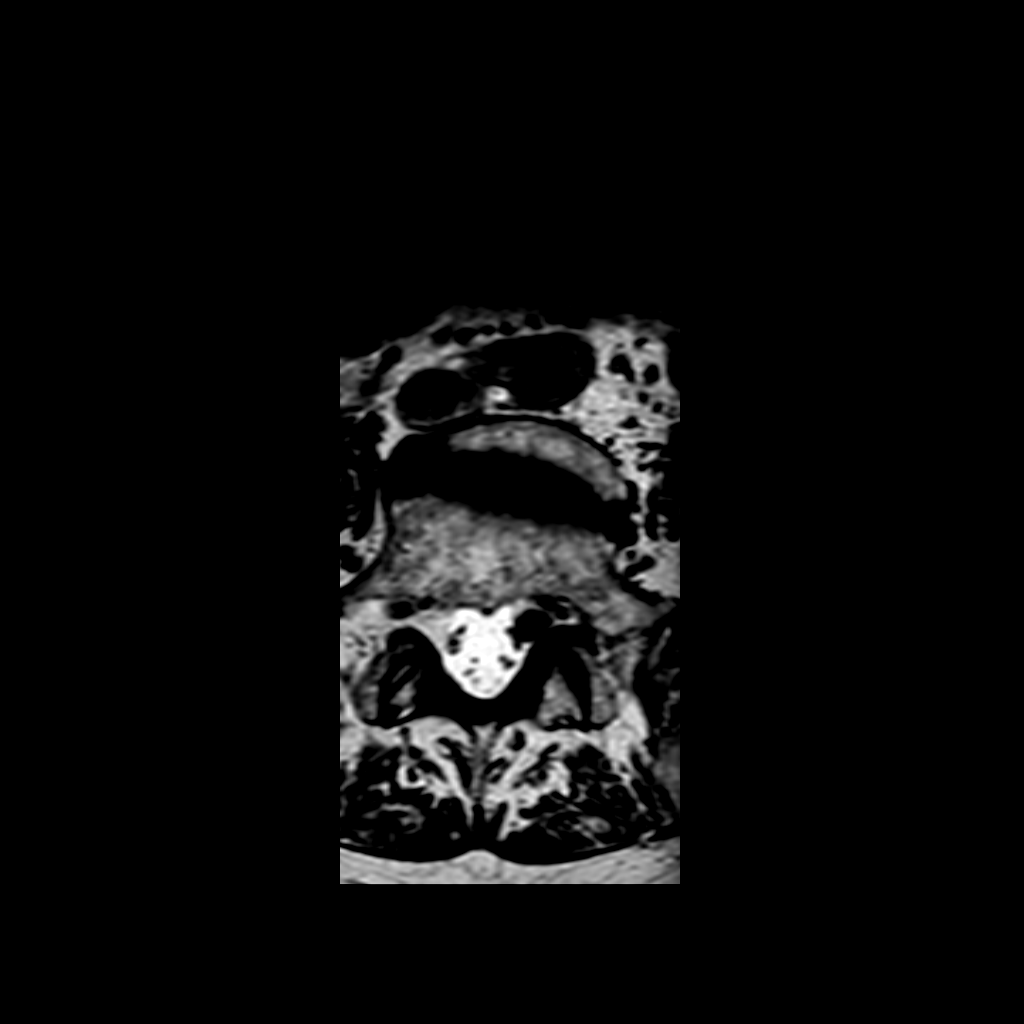

[11 of 48 positions shown; findings below may reference images not displayed]

FINDINGS: VERTEBRA: The vertebral bodies are normal in height. No acute vertebral body 
fracture. 14 degrees thoracic dextroscoliosis and 15 degrees lumbar 
levoscoliosis. 0.3 cm anterolisthesis at L3-4. 0.8 cm anterolisthesis at L4-5. 
No spondylolisthesis. No marrow replacing lesions.  
SPINAL CORD: Spinal cord is normal in caliber and signal intensity. No abnormal 
enhancement. Conus medullaris is at the level of L1. No intrathecal hematoma. 
THORACIC DISCS: Multilevel degenerative disc disease. No disc protrusions or 
evidence of discitis. No thoracic spinal stenosis. 
Modic I-II: Mild type I Modic changes at T5-7. 
Ligamentum Flavum > 2.5 mm: All lumbar levels. 
T12-L1: Small central disc protrusion measures 0.3 cm in AP dimensions. Disc 
space is normal in height with disc desiccation. Normal facets. No spinal canal 
or neural foraminal stenosis. 
L1-L2: Mild disc bulge, mild disc space narrowing and disc desiccation. No disc 
herniation. Normal facets. No spinal canal or neural foraminal stenosis. 
L2-L3: Central annular tear, mild disc bulge, mild disc space narrowing and disc 
desiccation. No disc herniation. Mild facet arthropathy. No spinal canal or 
neural foraminal stenosis. 
L3-L4: 0.3 cm anterolisthesis. Mild disc bulge, marked disc space narrowing and 
disc desiccation. No disc herniation. Mild facet arthropathy and ligamentum 
flavum hypertrophy. No spinal canal or neural foraminal stenosis. 
L4-L5: 0.8 cm anterolisthesis with uncovering of the posterior disc. Mild disc 
bulge, moderate disc space narrowing and disc desiccation. No disc herniation. 
Marked facet arthropathy. Ligamentum flavum hypertrophy. Mild central canal and 
marked lateral recess stenosis, with potential encroachment on the L5 nerve 
roots. Mild right neural foraminal stenosis. 
L5-S1: Left-sided disc extrusion in the subarticular/foraminal region measures 
1.1 x 0.6 cm in cross-section and 1.1 cm in height, resulting in marked left 
lateral recess//moderate neural foraminal stenosis and encroachment on the left 
L5-S1 nerve roots. Mild disc bulge, marked disc space narrowing and disc 
desiccation. Mild facet arthropathy. No central canal stenosis. 
OTHER: Localizer view demonstrates degenerative change of the cervical spine and 
spinal stenosis. The aorta is normal in diameter. The posterior paraspinal soft 
tissues are negative. Breast implants. Splenule.
IMPRESSION: 1.  L5-S1 left subarticular/foraminal disc extrusion measures 1.1 x 0.6 x 1.1 cm 
and results in marked left lateral recess//moderate neural foraminal stenosis 
and encroachment on the left L5-S1 nerve roots. Neurosurgical consultation is 
recommended.  
2.  Multifocal thoracolumbar degenerative change, mild scoliosis and grade 1 
anterolisthesis at L3-4/L4-5.  
3.  Small T12/L1 central disc protrusion. 
4.  L4-5 mild central canal stenosis, marked lateral recess stenosis (with 
potential encroachment on the L5 nerve roots) and mild right neural foraminal 
stenosis.

## 2023-11-27 IMAGING — MR MRI THORACIC SPINE W/WO CONTRAST
5 of 14 series · 9 of 48 positions shown · IV contrast (gadolinium)
Comparison: None.

________________________________________________________________________________________________ 
MRI THORACIC SPINE W/WO CONTRAST, MRI LUMBAR SPINE W/WO CONTRAST, 11/27/2023 
[DATE]: 
CLINICAL INDICATION: Nontraumatic Subarachnoid Hemorrhage, Unspecified
TECHNIQUE: Multiplanar, multiecho position MR images of the thoracic and lumbar 
spine were performed without and with intravenous gadolinium enhancement. 5.5 mL 
of Gadavistwere injected intravenously by hand. 2 mL of Gadavist discarded. 
Patient was scanned on a 1.5T magnet.

[Series 101: t2_sag_count · sagittal · 4.0mm · 0.62mm/px · 2 of 22 slices shown]
[im 1/22]
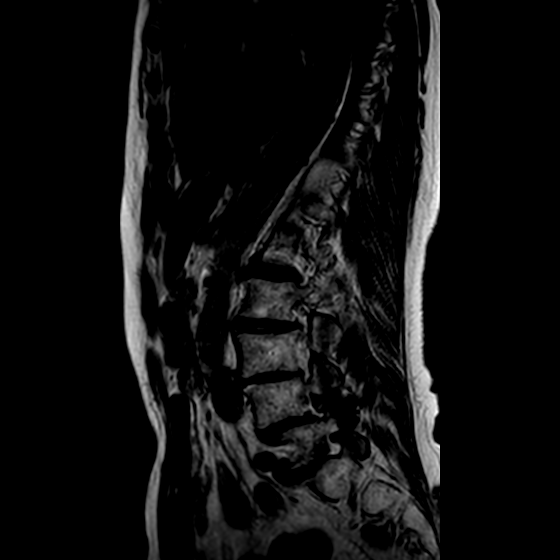
[im 22/22]
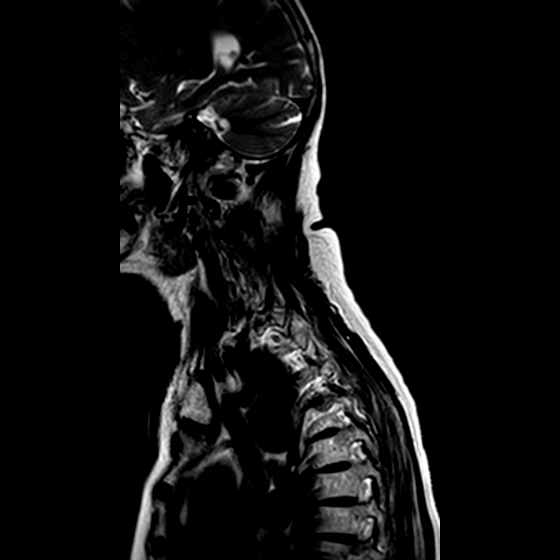

[Series 102: T2 · sagittal · 4.0mm · 0.62mm/px · 1 of 11 slices shown]
[im 1/11]
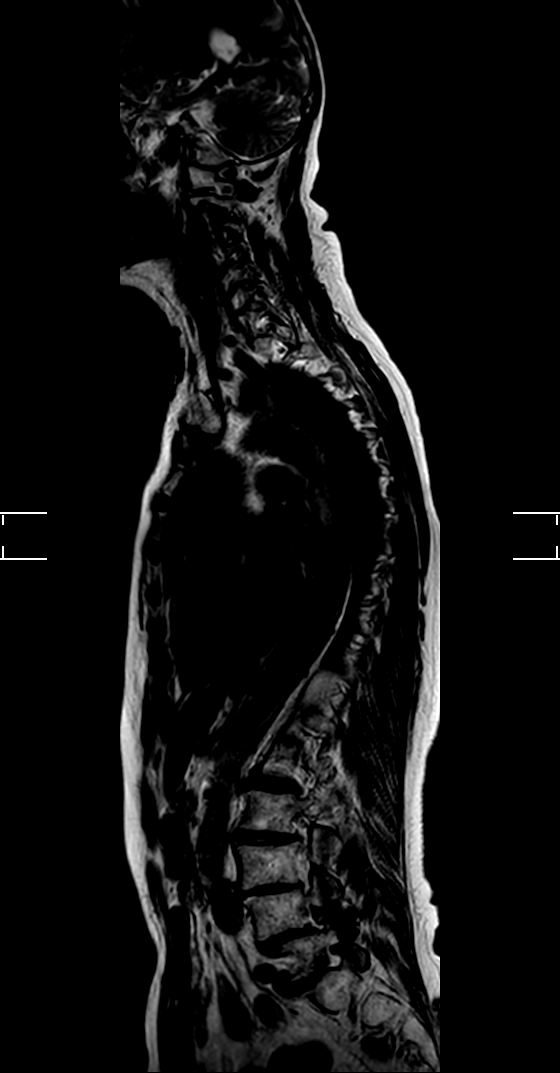

[Series 201: t2_cor_count · coronal · 4.0mm · 0.61mm/px · 2 of 26 slices shown]
[im 1/26]
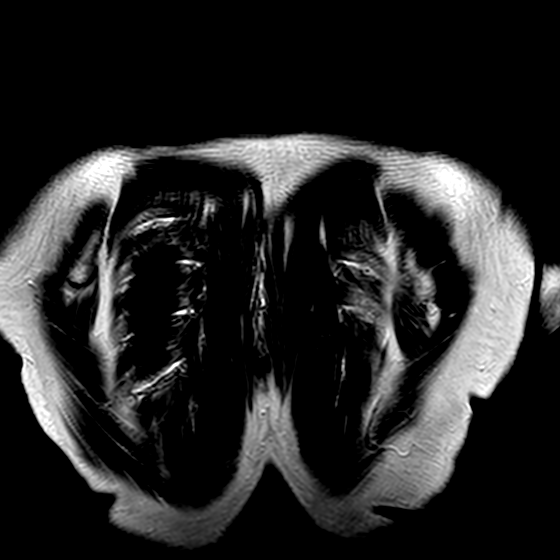
[im 26/26]
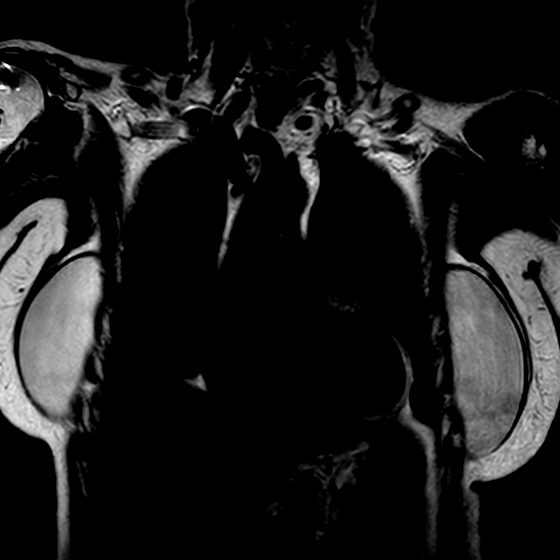

[Series 301: t1_tse_sag · sagittal · 3.0mm · 0.56mm/px · 2 of 22 slices shown]
[im 1/22]
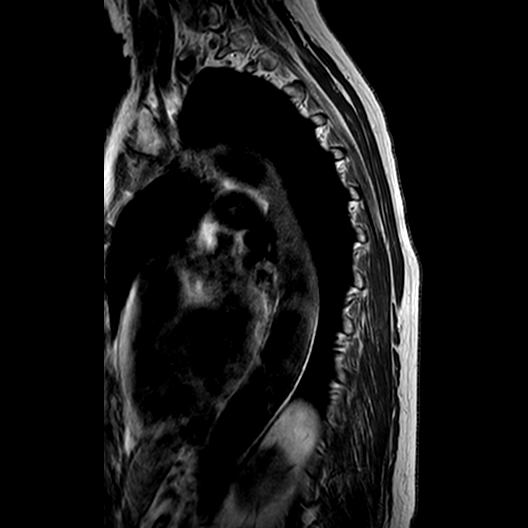
[im 22/22]
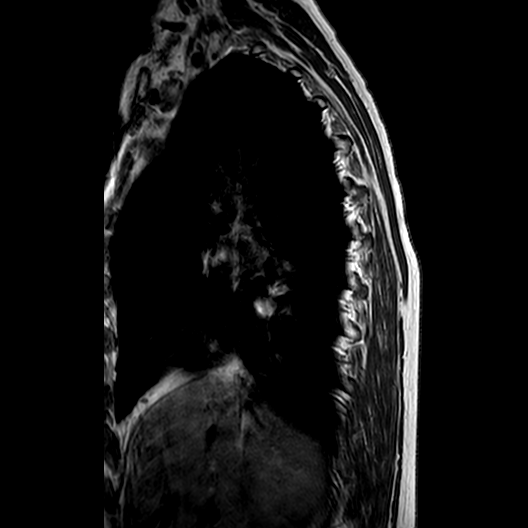

[Series 402: v3d view_t2w tsp · axial · 1.0mm · 0.33mm/px · z∈[-285,-234]mm · 2 of 55 slices shown]
[im 1/55]
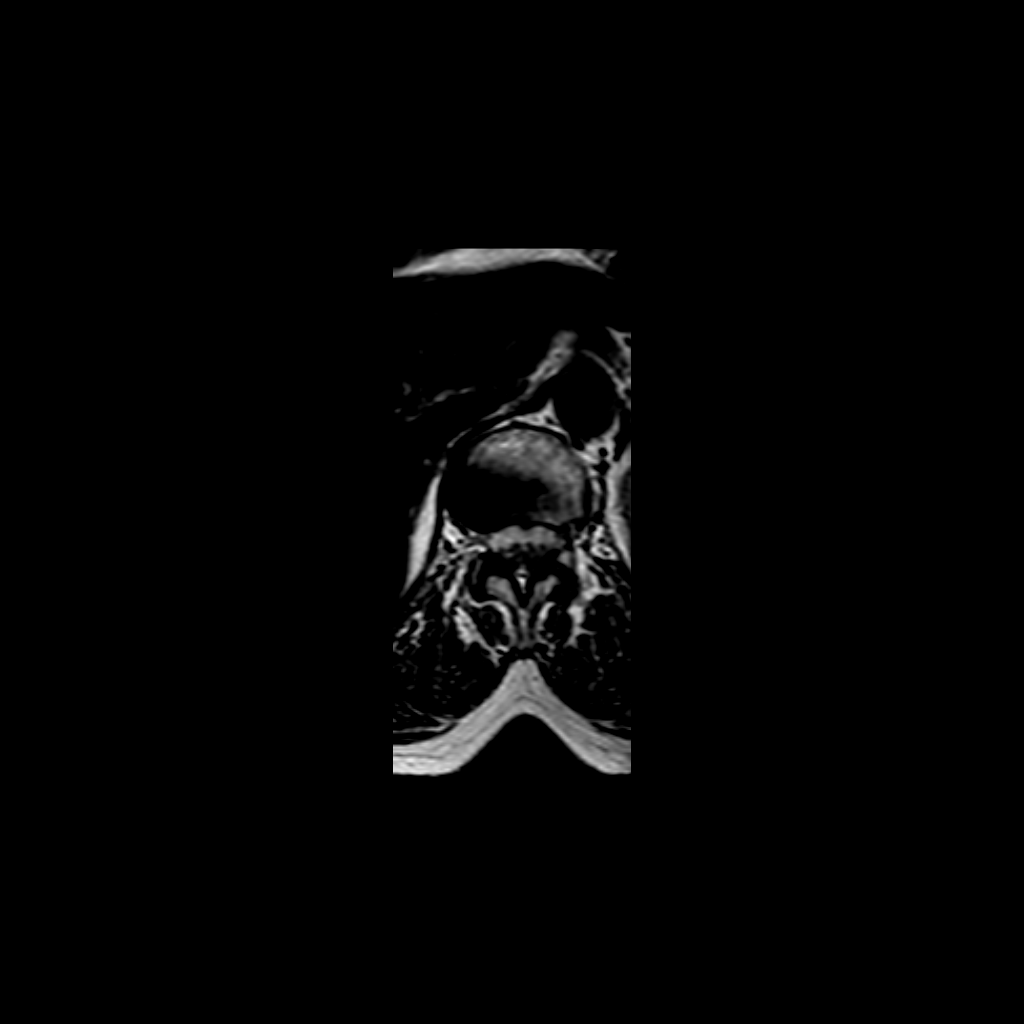
[im 14/55]
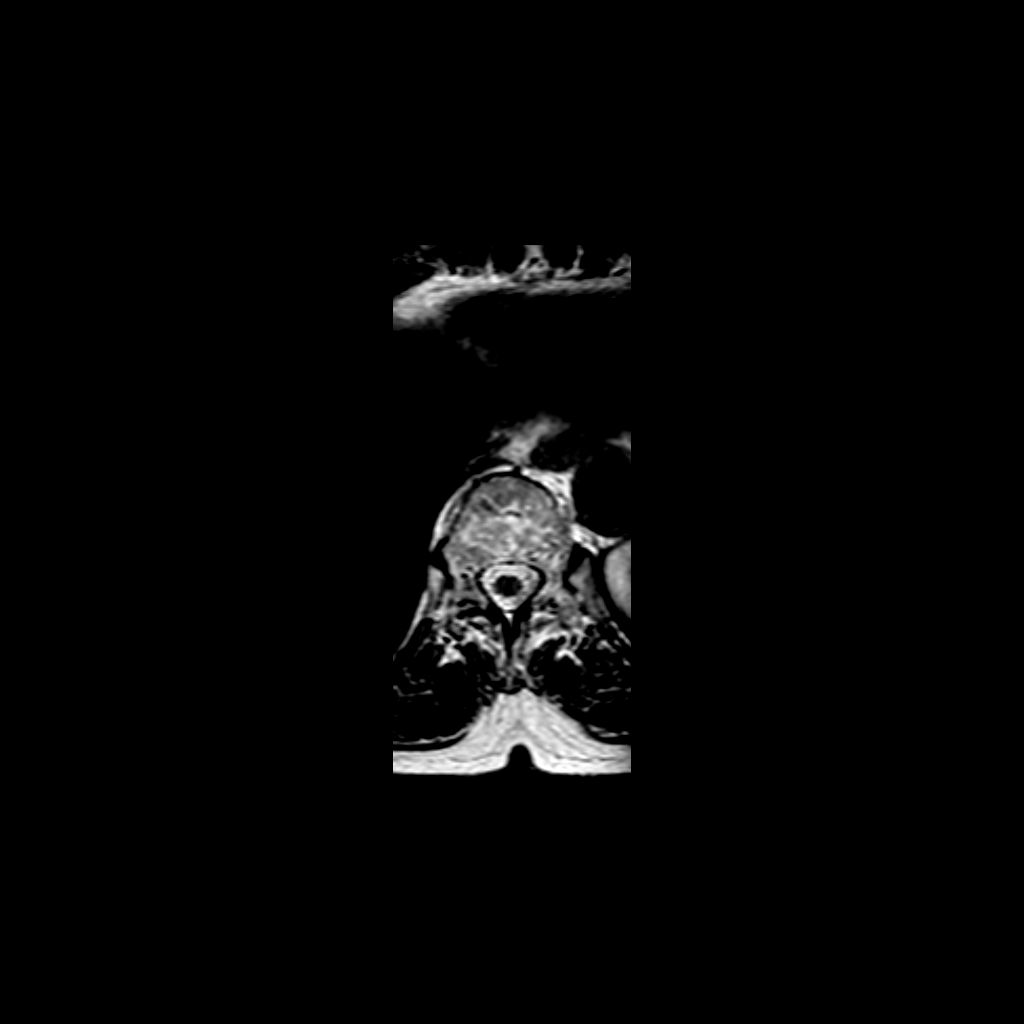

[9 of 48 positions shown; findings below may reference images not displayed]

FINDINGS: VERTEBRA: The vertebral bodies are normal in height. No acute vertebral body 
fracture. 14 degrees thoracic dextroscoliosis and 15 degrees lumbar 
levoscoliosis. 0.3 cm anterolisthesis at L3-4. 0.8 cm anterolisthesis at L4-5. 
No spondylolisthesis. No marrow replacing lesions.  
SPINAL CORD: Spinal cord is normal in caliber and signal intensity. No abnormal 
enhancement. Conus medullaris is at the level of L1. No intrathecal hematoma. 
THORACIC DISCS: Multilevel degenerative disc disease. No disc protrusions or 
evidence of discitis. No thoracic spinal stenosis. 
Modic I-II: Mild type I Modic changes at T5-7. 
Ligamentum Flavum > 2.5 mm: All lumbar levels. 
T12-L1: Small central disc protrusion measures 0.3 cm in AP dimensions. Disc 
space is normal in height with disc desiccation. Normal facets. No spinal canal 
or neural foraminal stenosis. 
L1-L2: Mild disc bulge, mild disc space narrowing and disc desiccation. No disc 
herniation. Normal facets. No spinal canal or neural foraminal stenosis. 
L2-L3: Central annular tear, mild disc bulge, mild disc space narrowing and disc 
desiccation. No disc herniation. Mild facet arthropathy. No spinal canal or 
neural foraminal stenosis. 
L3-L4: 0.3 cm anterolisthesis. Mild disc bulge, marked disc space narrowing and 
disc desiccation. No disc herniation. Mild facet arthropathy and ligamentum 
flavum hypertrophy. No spinal canal or neural foraminal stenosis. 
L4-L5: 0.8 cm anterolisthesis with uncovering of the posterior disc. Mild disc 
bulge, moderate disc space narrowing and disc desiccation. No disc herniation. 
Marked facet arthropathy. Ligamentum flavum hypertrophy. Mild central canal and 
marked lateral recess stenosis, with potential encroachment on the L5 nerve 
roots. Mild right neural foraminal stenosis. 
L5-S1: Left-sided disc extrusion in the subarticular/foraminal region measures 
1.1 x 0.6 cm in cross-section and 1.1 cm in height, resulting in marked left 
lateral recess//moderate neural foraminal stenosis and encroachment on the left 
L5-S1 nerve roots. Mild disc bulge, marked disc space narrowing and disc 
desiccation. Mild facet arthropathy. No central canal stenosis. 
OTHER: Localizer view demonstrates degenerative change of the cervical spine and 
spinal stenosis. The aorta is normal in diameter. The posterior paraspinal soft 
tissues are negative. Breast implants. Splenule.
IMPRESSION: 1.  L5-S1 left subarticular/foraminal disc extrusion measures 1.1 x 0.6 x 1.1 cm 
and results in marked left lateral recess//moderate neural foraminal stenosis 
and encroachment on the left L5-S1 nerve roots. Neurosurgical consultation is 
recommended.  
2.  Multifocal thoracolumbar degenerative change, mild scoliosis and grade 1 
anterolisthesis at L3-4/L4-5.  
3.  Small T12/L1 central disc protrusion. 
4.  L4-5 mild central canal stenosis, marked lateral recess stenosis (with 
potential encroachment on the L5 nerve roots) and mild right neural foraminal 
stenosis.

## 2024-03-15 IMAGING — CT CT ABDOMEN AND PELVIS WITH IV AND ORAL CONT
2 of 3 series · 16 of 46 positions shown, 18 images · IV contrast (isovue)
Comparison: 05/19/2023   
Count of known CT and Cardiac Nuclear Medicine studies performed in the previous 
12 months = 4

________________________________________________________________________________________________ 
CT ABDOMEN AND PELVIS WITH IV AND ORAL CONT, 03/15/2024 [DATE]: 
CLINICAL INDICATION: Malignant Neoplasm Of Unspecified Ovary 
A search for DICOM formatted images was conducted for prior CT imaging studies 
completed at a non-affiliated media free facility.
TECHNIQUE: The abdomen and pelvis were scanned from lung bases through the 
pubic rami with 75 mL of Isovue 300 MDV contrast on a high-resolution CT scanner 
using dose reduction techniques. Patient drank diluted 2 mL of Gastrografin. 
Routine MPR reconstructions were performed. The patients eGFR was calculated to 
be 47.6 mL/min/1.73 m2 using the i-STAT device.

[Series 10: abd/pel with 3.0 i41s 2 · axial · 0.69mm/px · z∈[-469,-100]mm · 13 of 143 slices shown, 15 images]
[im 10/143  soft-tissue]
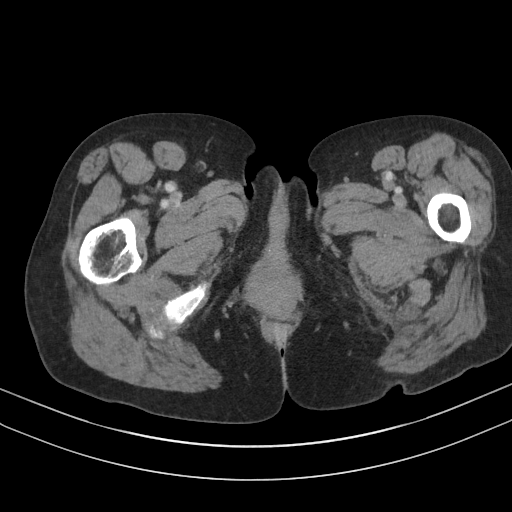
[im 10/143  bone]
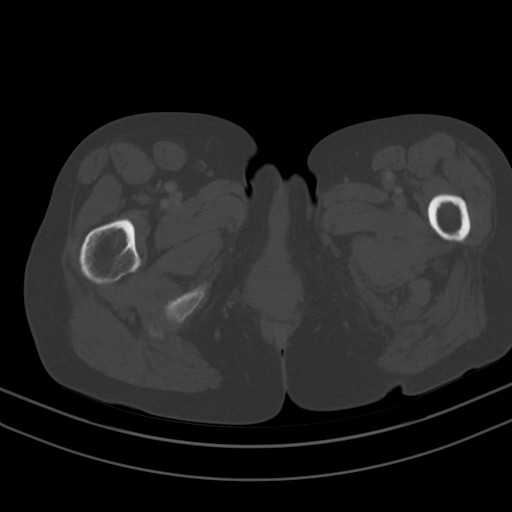
[im 19/143  soft-tissue]
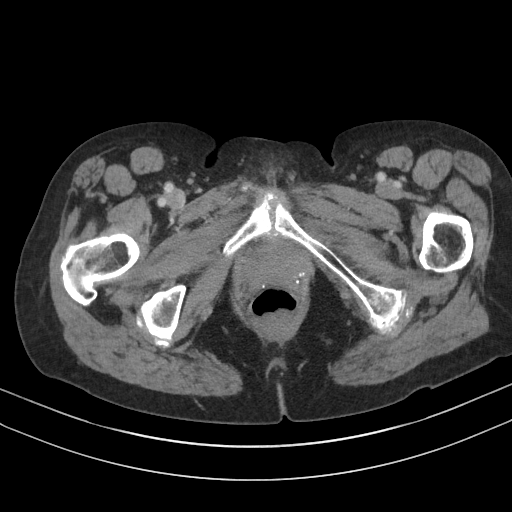
[im 28/143  soft-tissue]
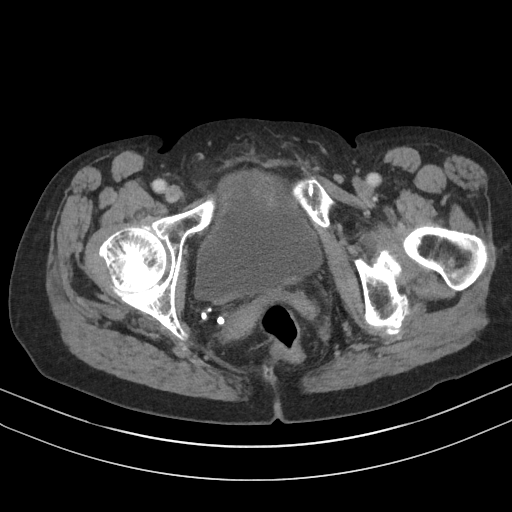
[im 42/143  soft-tissue]
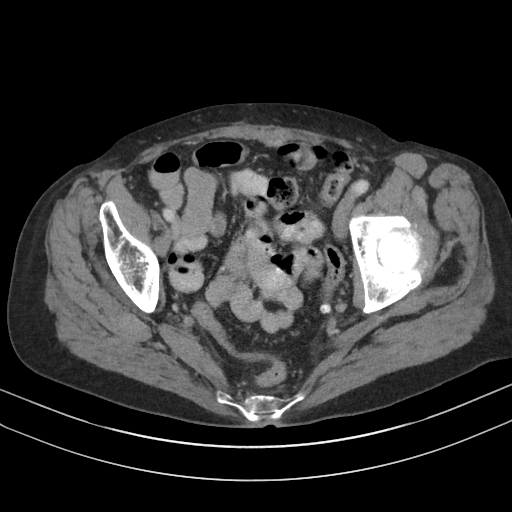
[im 51/143  soft-tissue]
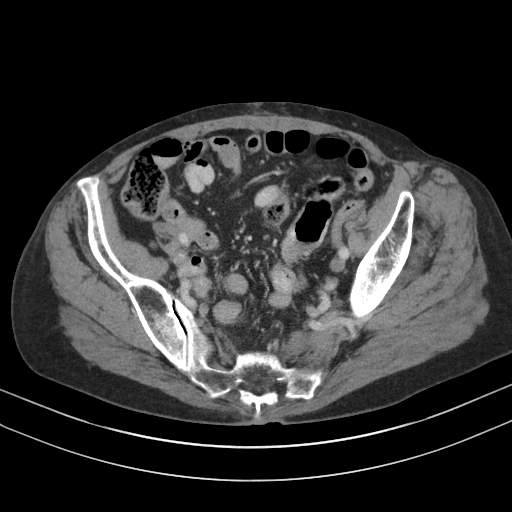
[im 60/143  soft-tissue]
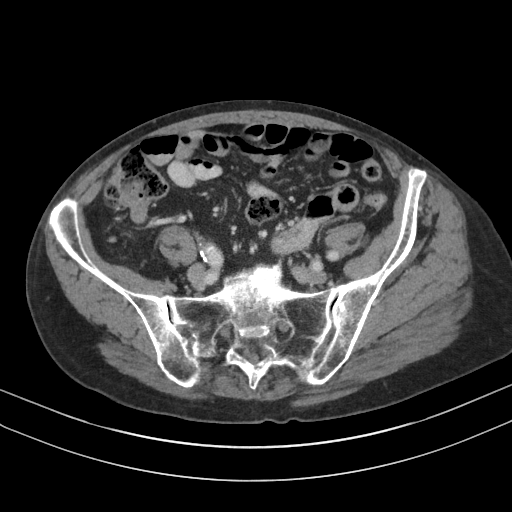
[im 74/143  soft-tissue]
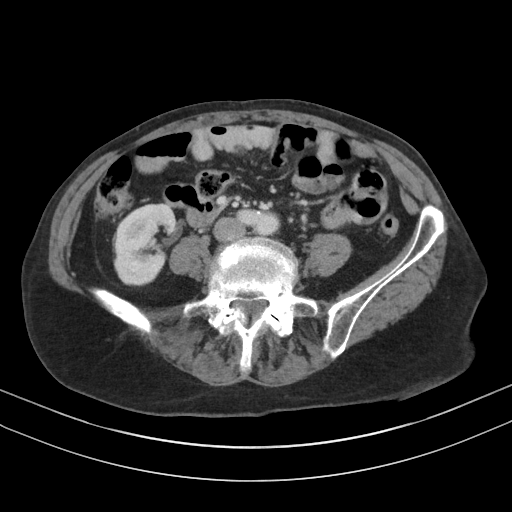
[im 83/143  soft-tissue]
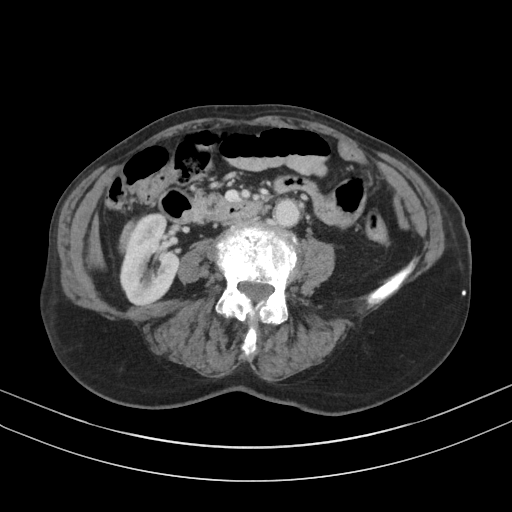
[im 92/143  soft-tissue]
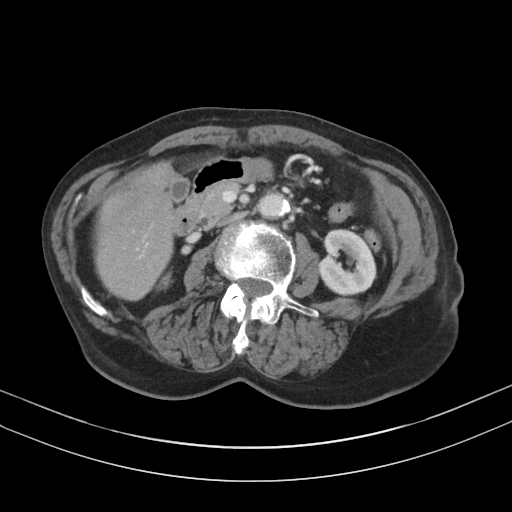
[im 92/143  bone]
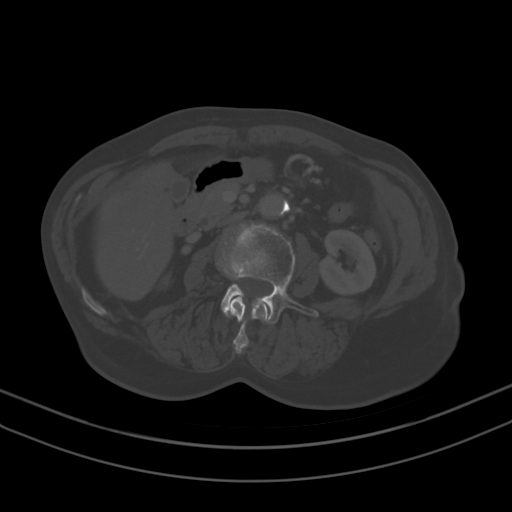
[im 101/143  soft-tissue]
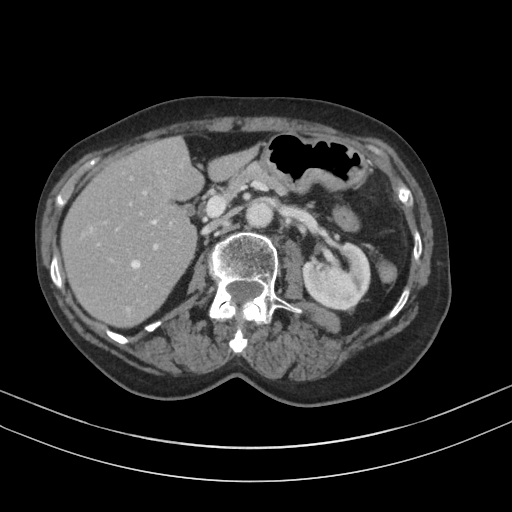
[im 115/143  soft-tissue]
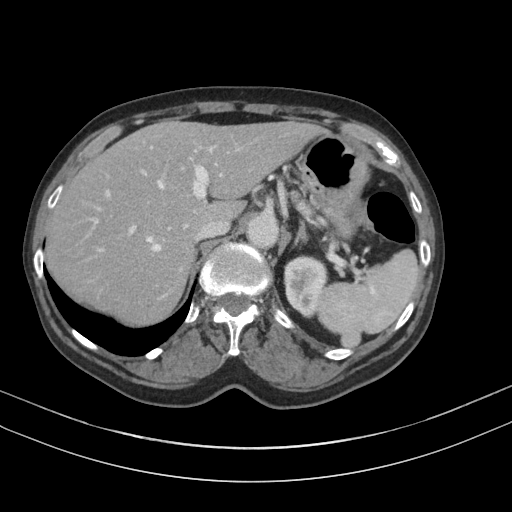
[im 124/143  soft-tissue]
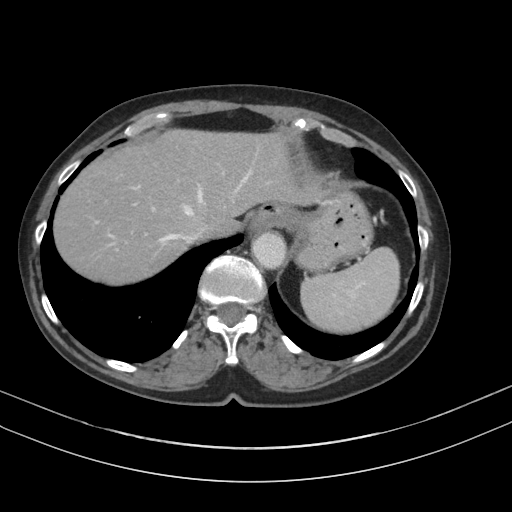
[im 133/143  soft-tissue]
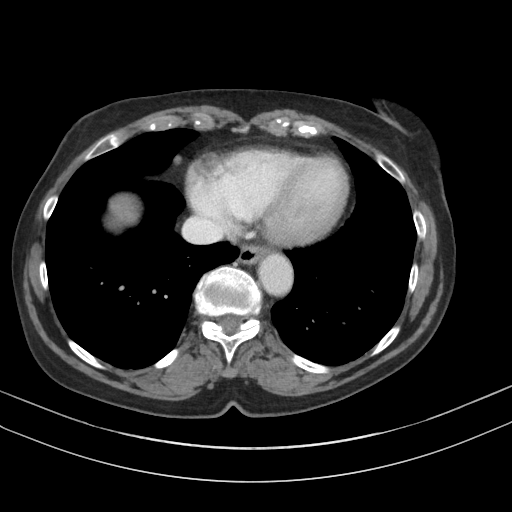

[Series 12: cor from thins · coronal · 0.72mm/px · 3 of 117 slices shown]
[im 39/117  soft-tissue]
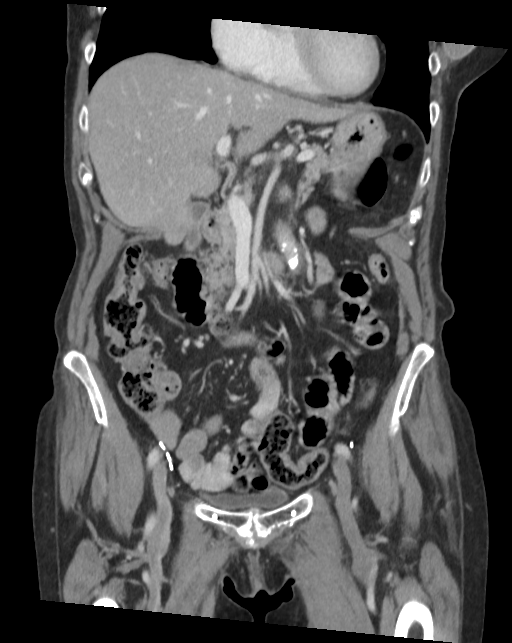
[im 52/117  soft-tissue]
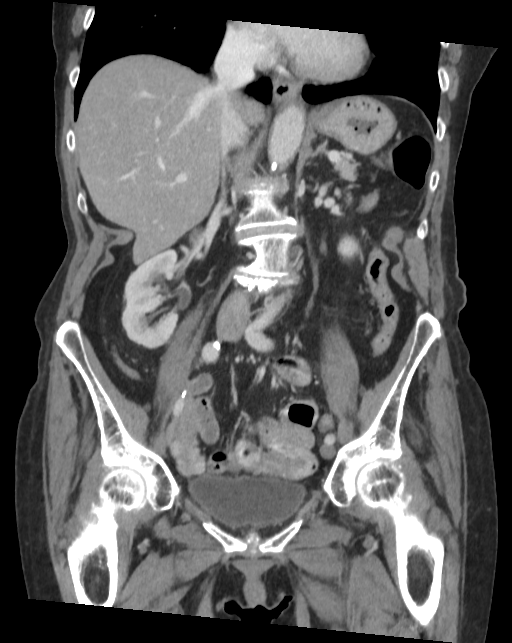
[im 65/117  soft-tissue]
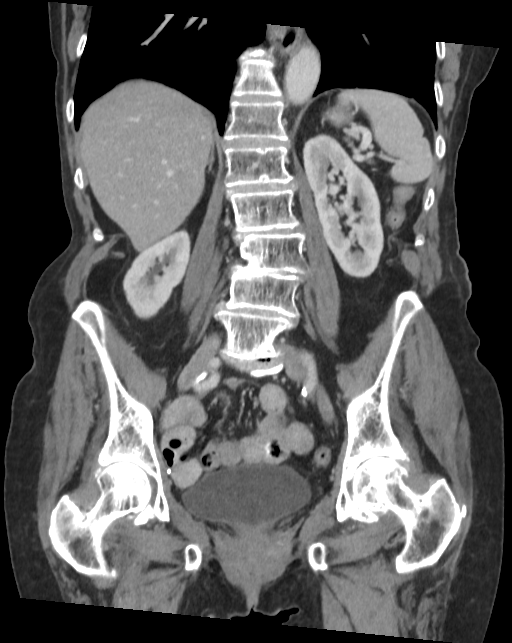

[16 of 46 positions shown; findings below may reference images not displayed]

FINDINGS: BREASTS: Implants. 
LUNG BASES: RML atelectasis. No pleural effusions. 
HEPATOBILIARY: Images show no mass or biliary dilatation. No gallstones. 
SPLEEN: Normal in size. Splenule. 
PANCREAS: No evidence for ductal dilatation or mass.   
ADRENALS: No mass. 
GENITOURINARY: No hydronephrosis or kidney stones. Bladder is unremarkable. 
Hysterectomy and bilateral pelvic clips. Neither ovary is identified. No adnexal 
mass. 
LYMPH NODES: No adenopathy. 
STOMACH, SMALL BOWEL AND COLON: No bowel wall thickening or obstruction. Normal 
appendix. 
VASCULAR STRUCTURES: No aneurysm. Atherosclerosis. 
MUSCULOSKELETAL: Degenerative change, grade 1 anterolisthesis at L4-5 and 
osteopenia. Localizer view demonstrates right humeral head metallic rotator cuff 
suture anchors.
IMPRESSION: 1.  History of ovarian carcinoma and hysterectomy. Neither ovary is identified. 
No adnexal mass or evidence of metastases. 
2.  Breast implants. 
3.  Atherosclerosis. 
4.  Degenerative change, grade 1 anterolisthesis at L4-5 and osteopenia: DXA 
with TBS (trabecular bone score) may be helpful for further evaluation.  
RADIATION DOSE REDUCTION: All CT scans are performed using radiation dose 
reduction techniques, when applicable.  Technical factors are evaluated and 
adjusted to ensure appropriate moderation of exposure.  Automated dose 
management technology is applied to adjust the radiation doses to minimize 
exposure while achieving diagnostic quality images.

## 2024-03-15 IMAGING — CT CT CHEST WITH CONTRAST
2 of 4 series · 15 of 36 positions shown, 18 images · IV contrast (ISOVUE 300)
Comparison: May 2023 exam   
Count of known CT and Cardiac Nuclear Medicine studies performed in the previous 
12 months = 4.

________________________________________________________________________________________________ 
CT CHEST WITH CONTRAST, 03/15/2024 [DATE]: 
CLINICAL INDICATION: Malignant Neoplasm Of Left Ovary diagnosed in 5883 treated 
again in 8018 and [id number]. Previous hysterectomy and chemotherapy.  
A search for DICOM formatted images was conducted for prior CT imaging studies 
completed at a non-affiliated media free facility.
TECHNIQUE: The chest was scanned from base of neck through the lung bases with 
75 mL of Isovue 300 MDV injected intravenously on a high resolution low dose CT 
scanner. Routine MPR and MIP reconstruction images were performed. The patients 
eGFR was calculated to be 47.6 mL/min/1.73 m2 using the i-STAT device.

[Series 5: chest 2.0 i31s 3 · axial · 0.69mm/px · z∈[-178,+106]mm · 12 of 158 slices shown, 15 images]
[im 8/158  mediastinal]
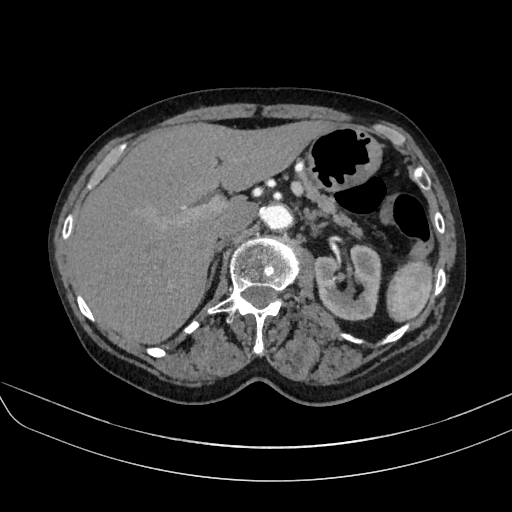
[im 8/158  lung]
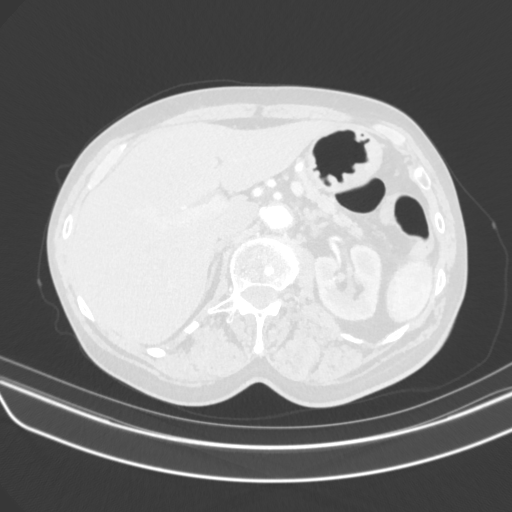
[im 23/158  lung]
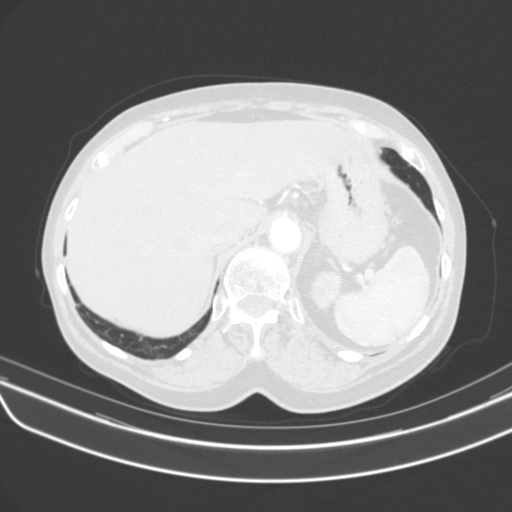
[im 38/158  lung]
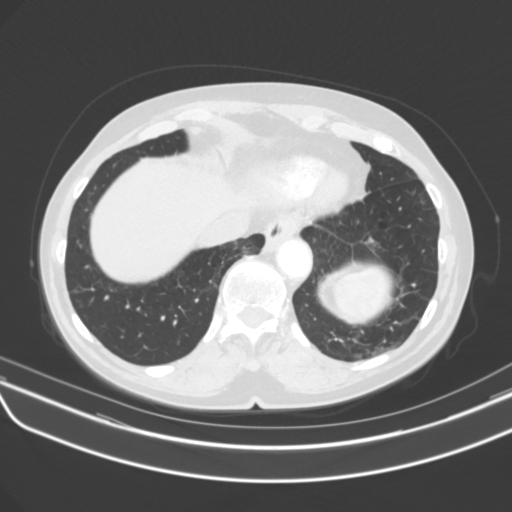
[im 45/158  lung]
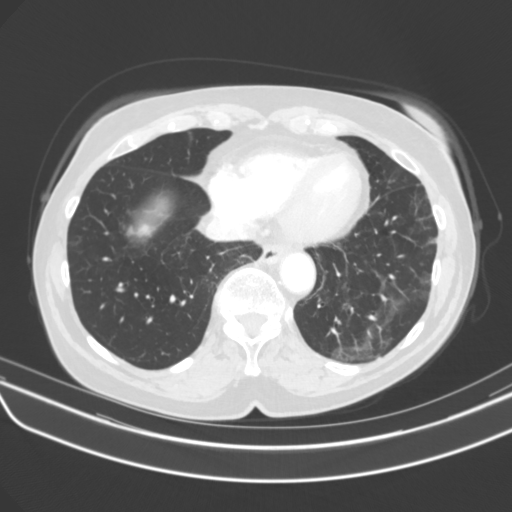
[im 60/158  mediastinal]
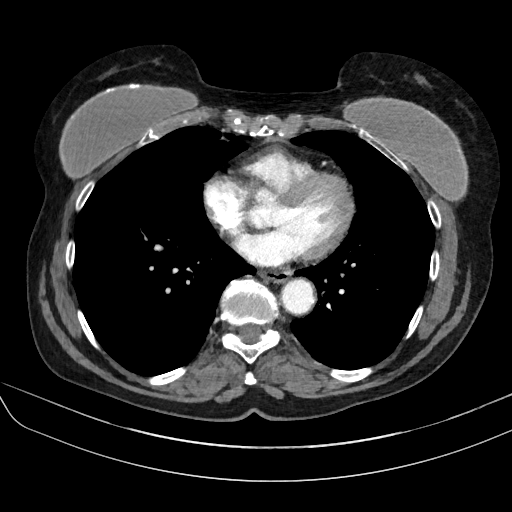
[im 60/158  lung]
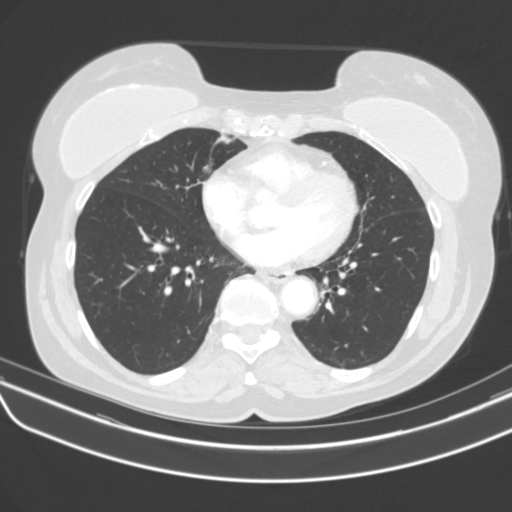
[im 75/158  lung]
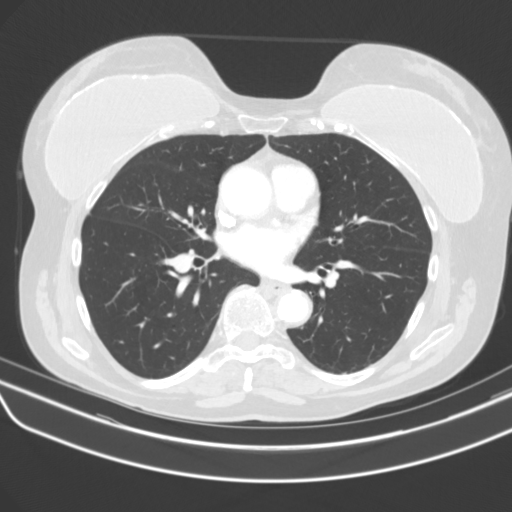
[im 83/158  lung]
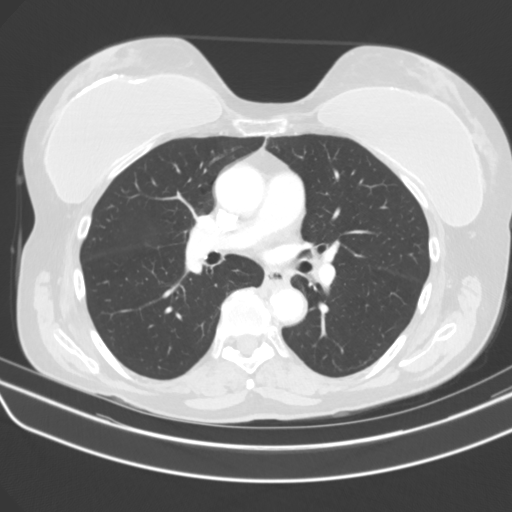
[im 98/158  lung]
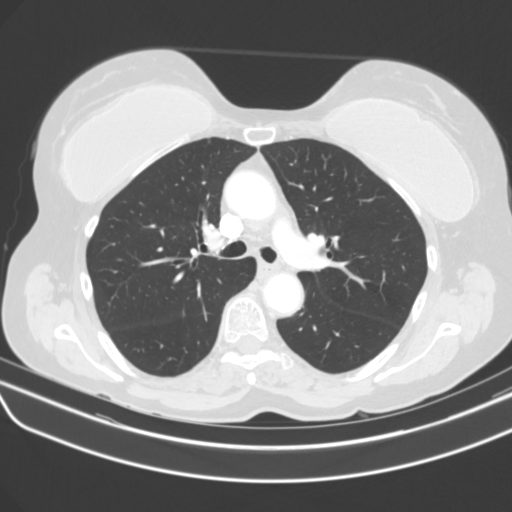
[im 113/158  mediastinal]
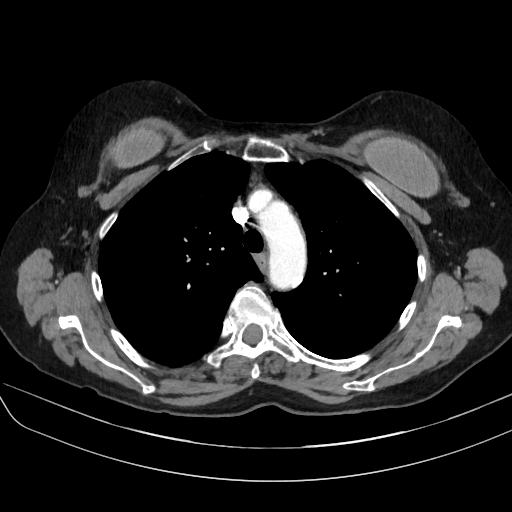
[im 113/158  lung]
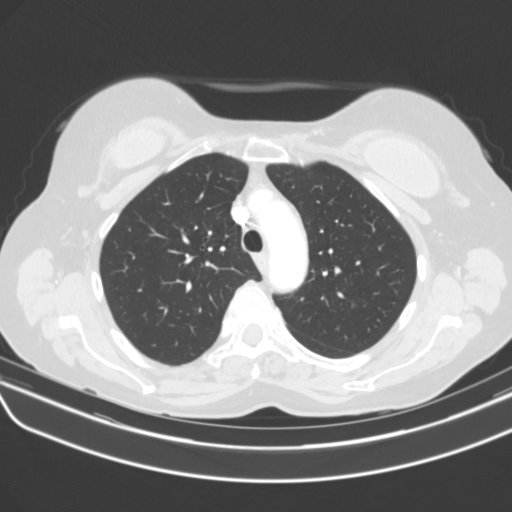
[im 120/158  lung]
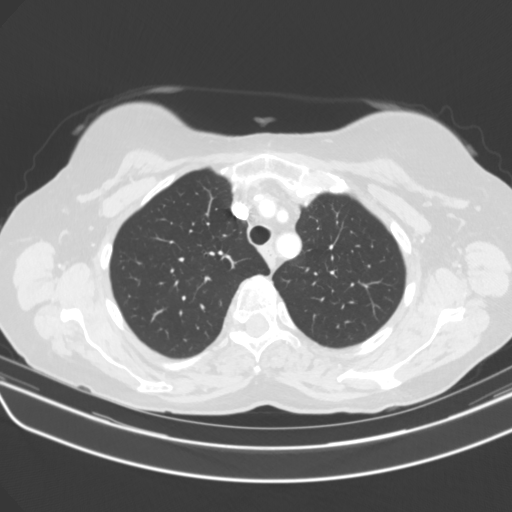
[im 135/158  lung]
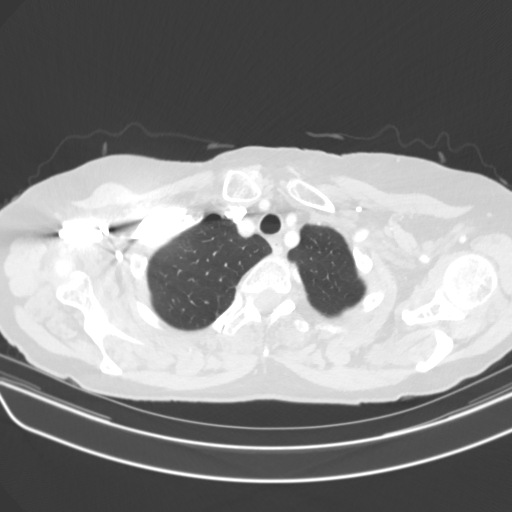
[im 150/158  lung]
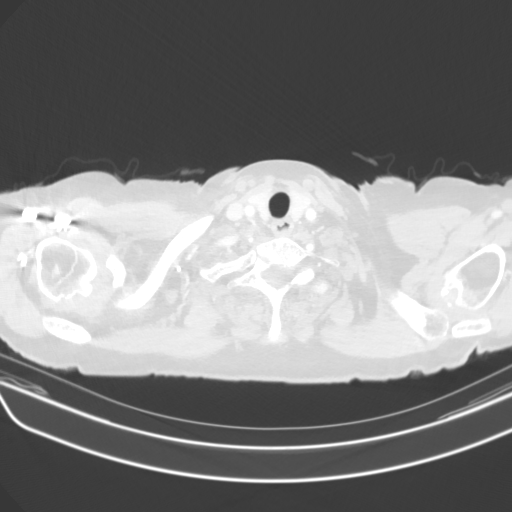

[Series 7: coronal · coronal · 0.62mm/px · 3 of 122 slices shown]
[im 25/122  lung]
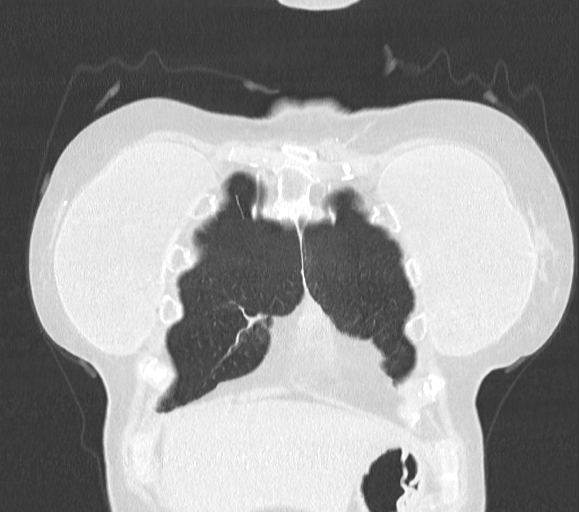
[im 49/122  lung]
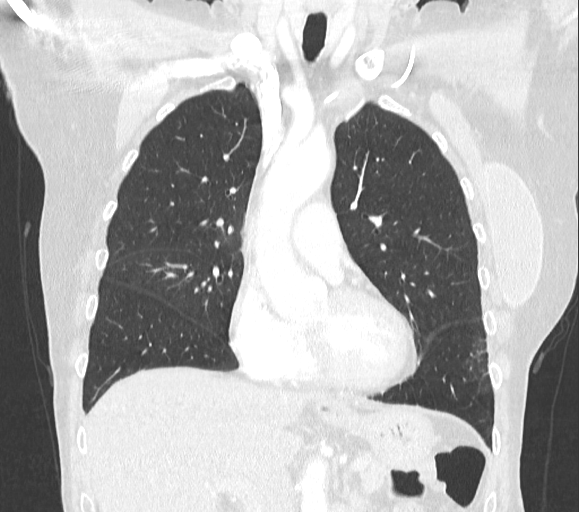
[im 73/122  lung]
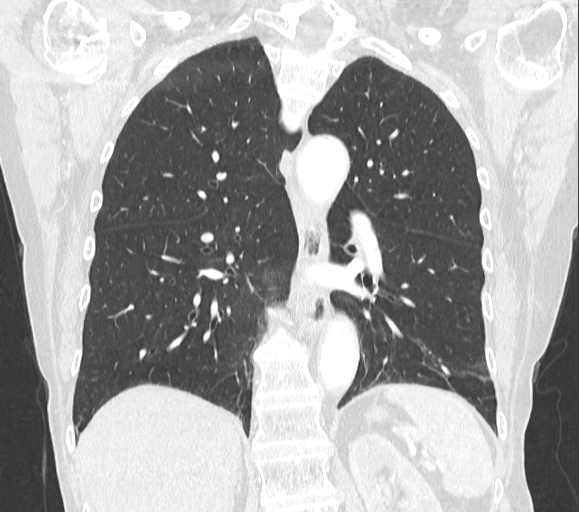

[15 of 36 positions shown; findings below may reference images not displayed]

FINDINGS: LUNGS AND PLEURA:  Calcified granuloma again identified. Progressive atelectasis 
in the right middle lobe as well as in the left posterior sulcus. No mass or 
consolidation. No significant bronchial wall thickening identified. 
MEDIASTINUM:  No adenopathy. Normal heart size. No pericardial effusion. Mild 
coronary calcifications. 
CHEST WALL/AXILLA: Bilateral breast implants. No adenopathy. 
UPPER ABDOMEN: Negative. 
MUSCULOSKELETAL: Mild degenerative changes. Suspected underlying osteopenia.
IMPRESSION: Calcified granuloma once again identified. Mildly progressive atelectasis right 
middle lobe and left lower lobe. 
Atherosclerotic changes and degenerative changes. Suspect underlying osteopenia. 
Would be better evaluated with DEXA scan and TBS. 
In patients between the ages of 50-77 where pulmonary emphysema is noted on CT, 
recommend evaluation for low dose lung cancer screening protocol if patient is 
not already enrolled; as pulmonary emphysema is an independent risk factor for 
lung cancer. 
RADIATION DOSE REDUCTION: All CT scans are performed using radiation dose 
reduction techniques, when applicable.  Technical factors are evaluated and 
adjusted to ensure appropriate moderation of exposure.  Automated dose 
management technology is applied to adjust the radiation doses to minimize 
exposure while achieving diagnostic quality images.
